# Patient Record
Sex: Female | Born: 1975
Health system: Southern US, Community
[De-identification: ages and names within clinical notes are randomized; demographics above are authoritative.]

## PROBLEM LIST (undated history)

## (undated) DIAGNOSIS — B181 Chronic viral hepatitis B without delta-agent: Secondary | ICD-10-CM

## (undated) DIAGNOSIS — D649 Anemia, unspecified: Secondary | ICD-10-CM

## (undated) DIAGNOSIS — Z9289 Personal history of other medical treatment: Secondary | ICD-10-CM

## (undated) DIAGNOSIS — B20 Human immunodeficiency virus [HIV] disease: Secondary | ICD-10-CM

## (undated) DIAGNOSIS — Z21 Asymptomatic human immunodeficiency virus [HIV] infection status: Secondary | ICD-10-CM

## (undated) DIAGNOSIS — D849 Immunodeficiency, unspecified: Secondary | ICD-10-CM

## (undated) HISTORY — PX: ENDOMETRIAL BIOPSY: SHX622

## (undated) NOTE — *Deleted (*Deleted)
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 female with a past medical history significant for hepatitis B, HIV, chronic anemia who presented to med [facility] with abdominal pain.  She had a 2-week history of early satiety, poor appetite and, 5 pound weight loss.  Abdominal pain is typically postprandial and mainly at night after dinner while laying in bed.  Has been associated with nausea improves after vomiting.  Labs on admission showed a hemoglobin of 9.3, MCV 67.4, platelet count 554,000.  Lipase was mildly elevated at 123.  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  Husband was at the bedside at the time of the visit.  The patient reports that she is having ongoing abdominal discomfort today.  Pain is primarily in her epigastric area and radiates to the left side of her abdomen.  States pain is worse when laying down after she eats.  She states that she has had this pain for at least 2 weeks.  Her abdominal pain is worse after eating.  She has had some nausea and intermittent vomiting.  Vomiting makes her pain better.  She was attributing her symptoms to some the medications that she was taking for fertility treatments.  She reports a poor appetite and about a 5 pound weight loss.  She is not having  any fevers or chills.  Denies night sweats.  She has not noticed any palpable lymphadenopathy.  She denies headaches, dizziness, chest pain, shortness of breath, diarrhea.  She reports intermittent constipation.  Denies bleeding or lower extremity edema.  The patient is married and has no children.  She currently works as an Charity fundraiser.  Denies alcohol tobacco use.  Family history significant for paternal grandmother with uterine cancer.  Medical oncology was asked see the patient to make recommendations regarding her abnormal CT scan findings.  INTERVAL HISTORY: Heather [name] is a 43 year old female who is here for evaluation and management of Large B-cell lymphoma. We are joined today by her husband.*** The patient's last visit with [place] was on ***. The pt reports that *** is doing well overall.  The pt reports ***  Of note since the patient's last visit, pt has had *** completed on *** with results revealing ***.  Lab results today (09/05/20) of CBC w/diff and CMP is as follows: all values are WNL except for ***.  On review of systems, pt reports *** and denies ***and any other symptoms.   A&P: -Discussed pt labwork today, 09/05/20; *** -***  MEDICAL HISTORY:  Past Medical History:  Diagnosis Date  . Anemia   . Diffuse large B-cell lymphoma of lymph nodes of multiple regions (HCC) 04/12/2020  . 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 History of blood transfusion    childhood  . HIV (human immunodeficiency virus infection) (HCC)   . Immune deficiency disorder [facility] [place])     SURGICAL HISTORY: Past Surgical  History:  Procedure Laterality Date  . CHROMOPERTUBATION Bilateral 11/29/2016   Procedure: CHROMOPERTUBATION;  Surgeon: Hoover Browns, MD;  Location: [facility];  Service: Gynecology;  Laterality: Bilateral;  fallopian tubes  . ENDOMETRIAL BIOPSY    . IR IMAGING GUIDED PORT INSERTION  04/15/2020  . IR THORACENTESIS ASP PLEURAL SPACE W/IMG GUIDE  04/15/2020  . MYOMECTOMY N/A 11/29/2016   Procedure:  MYOMECTOMY;  Surgeon: Hoover Browns, MD;  Location: [facility];  Service: Gynecology;  Laterality: N/A;    SOCIAL HISTORY: Social History   Socioeconomic History  . Marital status: Married    Spouse name: Not on file  . Number of children: Not on file  . Years of education: Not on file  . Highest education level: Not on file  Occupational History  . Not on file  Tobacco Use  . Smoking status: Never Smoker  . Smokeless tobacco: Never Used  Substance and Sexual Activity  . Alcohol use: Yes    Comment: occ  . Drug use: No  . Sexual activity: Yes  Other Topics Concern  . Not on file  Social History Narrative  . Not on file   Social Determinants of Health   Financial Resource Strain:   . Difficulty of Paying Living Expenses: Not on file  Food Insecurity:   . Worried About Programme researcher, broadcasting/film/video in the Last Year: Not on file  . Ran Out of Food in the Last Year: Not on file  Transportation Needs:   . Lack of Transportation (Medical): Not on file  . Lack of Transportation (Non-Medical): Not on file  Physical Activity:   . Days of Exercise per Week: Not on file  . Minutes of Exercise per Session: Not on file  Stress:   . Feeling of Stress : Not on file  Social Connections:   . Frequency of Communication with Friends and Family: Not on file  . Frequency of Social Gatherings with Friends and Family: Not on file  . Attends Religious Services: Not on file  . Active Member of Clubs or Organizations: Not on file  . Attends Banker Meetings: Not on file  . Marital Status: Not on file  Intimate Partner Violence:   . Fear of Current or Ex-Partner: Not on file  . Emotionally Abused: Not on file  . Physically Abused: Not on file  . Sexually Abused: Not on file    FAMILY HISTORY: Family History  Problem Relation Age of Onset  . Diabetes Mother   . Hypertension Mother     ALLERGIES:  has No Known Allergies.  MEDICATIONS:  Current Outpatient Medications  Medication Sig  Dispense Refill  . acetaminophen (TYLENOL) 500 MG tablet Take 500 mg by mouth every 6 (six) hours as needed for mild pain or headache.    Marland Kitchen acyclovir (ZOVIRAX) 400 MG tablet Take 1 tablet (400 mg total) by mouth 2 (two) times daily. 60 tablet 6  . B Complex Vitamins (B COMPLEX PO) Take 1 tablet by mouth daily.    . bictegravir-emtricitabine-tenofovir AF (BIKTARVY) 50-200-25 MG TABS tablet Take 1 tablet by mouth daily with breakfast.     . cholecalciferol (VITAMIN D3) 25 MCG (1000 UNIT) tablet Take 1 tablet (1,000 Units total) by mouth at bedtime.    . feeding supplement, ENSURE ENLIVE, (ENSURE ENLIVE) LIQD Take 237 mLs by mouth 2 (two) times daily between meals. 237 mL 12  . HYDROcodone-acetaminophen (NORCO) 5-325 MG tablet Take 1 tablet by mouth every 6 (six) hours as needed  for moderate pain. 30 tablet 0  . ibuprofen (ADVIL) 200 MG tablet Take 2 tablets (400 mg total) by mouth every 6 (six) hours as needed for mild pain. (Patient not taking: Reported on 06/20/2020) 30 tablet 0  . lidocaine-prilocaine (EMLA) cream Apply to affected area once (Patient taking differently: Apply 1 application topically as needed (port access). Apply to affected area once) 30 g 3  . LORazepam (ATIVAN) 0.5 MG tablet Take 1 tablet (0.5 mg total) by mouth every 6 (six) hours as needed (for chemo induced nausea or vomiting). 30 tablet 0  . Multiple Vitamin (MULTIVITAMIN WITH MINERALS) TABS tablet Take 1 tablet by mouth at bedtime.     . ondansetron (ZOFRAN) 8 MG tablet Take 1 tablet (8 mg total) by mouth every 8 (eight) hours as needed for nausea or vomiting. 30 tablet 1  . polyethylene glycol (MIRALAX / GLYCOLAX) 17 g packet Take 17 g by mouth daily as needed for mild constipation. 14 each 0  . prochlorperazine (COMPAZINE) 10 MG tablet Take 1 tablet (10 mg total) by mouth every 6 (six) hours as needed for nausea or vomiting. 30 tablet 1  . senna-docusate (SENOKOT-S) 8.6-50 MG tablet Take 2 tablets by mouth at bedtime as  needed for mild constipation.    . Sodium Chloride-Sodium Bicarb (SODIUM BICARBONATE/SODIUM CHLORIDE) SOLN 1 application by Mouth Rinse route 4 (four) times daily.     No current facility-administered medications for this visit.    REVIEW OF SYSTEMS:   A 10+ POINT REVIEW OF SYSTEMS WAS OBTAINED including neurology, dermatology, psychiatry, cardiac, respiratory, lymph, extremities, GI, GU, Musculoskeletal, constitutional, breasts, reproductive, HEENT.  All pertinent positives are noted in the HPI.  All others are negative.   PHYSICAL EXAMINATION: ECOG PERFORMANCE STATUS: 1 - Symptomatic but completely ambulatory  . There were no vitals filed for this visit. There were no vitals filed for this visit. .There is no height or weight on file to calculate BMI.   *** GENERAL:alert, in no acute distress and comfortable SKIN: no acute rashes, no significant lesions EYES: conjunctiva are pink and non-injected, sclera anicteric OROPHARYNX: MMM, no exudates, no oropharyngeal erythema or ulceration NECK: supple, no JVD LYMPH:  no palpable lymphadenopathy in the cervical, axillary or inguinal regions LUNGS: clear to auscultation b/l with normal respiratory effort HEART: regular rate & rhythm ABDOMEN:  normoactive bowel sounds , non tender, not distended. No palpable hepatosplenomegaly.  Extremity: no pedal edema PSYCH: alert & oriented x 3 with fluent speech NEURO: no focal motor/sensory deficits  LABORATORY DATA:  I have reviewed the data as listed  . CBC Latest Ref Rng & Units 09/02/2020 08/18/2020 08/05/2020  WBC 4.0 - 10.5 K/uL 3.3(L) 22.5(H) 3.6(L)  Hemoglobin 12.0 - 15.0 g/dL 11.0(L) 9.5(L) 9.8(L)  Hematocrit 36 - 46 % 35.1(L) 29.8(L) 31.0(L)  Platelets 150 - 400 K/uL 227 218 291    . CMP Latest Ref Rng & Units 09/02/2020 08/18/2020 08/05/2020  Glucose 70 - 99 mg/dL 97 161(W) 960(A)  BUN 6 - 20 mg/dL 13 9 15   Creatinine 0.44 - 1.00 mg/dL 5.40 9.81 1.91  Sodium 135 - 145 mmol/L 143  141 136  Potassium 3.5 - 5.1 mmol/L 4.5 3.8 3.5  Chloride 98 - 111 mmol/L 106 105 98  CO2 22 - 32 mmol/L 30 28 27   Calcium 8.9 - 10.3 mg/dL 47.8 9.9 9.3  Total Protein 6.5 - 8.1 g/dL 6.7 6.6 6.5  Total Bilirubin 0.3 - 1.2 mg/dL 0.4 0.3 0.7  Alkaline Phos 38 -  126 U/L 93 121 60  AST 15 - 41 U/L 20 20 17   ALT 0 - 44 U/L 18 17 20    04/08/2020 Pancreatic Mass Surgical Pathology Report [phone]):     RADIOGRAPHIC STUDIES: I have personally reviewed the radiological images as listed and agreed with the findings in the report. NM PET Image Restage (PS) Skull Base to Thigh  Result Date: 08/30/2020 CLINICAL DATA:  Subsequent treatment strategy for lymphoma. EXAM: NUCLEAR MEDICINE PET SKULL BASE TO THIGH TECHNIQUE: 7.1 mCi F-18 FDG was injected intravenously. Full-ring PET imaging was performed from the skull base to thigh after the radiotracer. CT data was obtained and used for attenuation correction and anatomic localization. Fasting blood glucose: 101 mg/dl COMPARISON:  29/56/2130. FINDINGS: Mediastinal blood pool activity: SUV max 2.45 Liver activity: SUV max 3.85 NECK: No FDG avid mass or adenopathy identified. Bilateral and relatively symmetric increased uptake within the soft tissues of the posterior neck localizes to areas of fat consistent with metabolically active brown fat. Incidental CT findings: none CHEST: No hypermetabolic mediastinal or hilar nodes. No suspicious pulmonary nodules on the CT scan. Incidental CT findings: none ABDOMEN/PELVIS: Further reduction in size of splenic lesions. Index lesion within the superior pole measures 3.2 x 2.7 cm with SUV max of 3.4 (Deauville criteria 3). Previously this was measured at 6 x 6.4 cm with SUV max of 4.5. Within the mid portion of the spleen there is a low-density lesion measuring 2.9 by 2.9 cm with SUV max of 3.98 (Deauville criteria 3). Previously 5.5 x 4.4 cm with SUV max of 4.53. No abnormal uptake within the liver, pancreas, or  adrenal glands. No FDG avid lymph nodes within the abdomen or pelvis. Incidental CT findings: Fibroid uterus. Nonobstructing left renal calculi. SKELETON: No focal hypermetabolic activity to suggest skeletal metastasis. Incidental CT findings: none IMPRESSION: 1. Continued reduction in size and metabolic activity of splenic lesions. Residual areas of low attenuation within the spleen exhibit mild FDG uptake compatible with Deauville criteria 3. 2. No new sites of disease. Electronically Signed   By: Signa Kell M.D.   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 Hemoglobin C], childhood malaria. Patient notes that she had a CT of the abdomen sometime in 2020 that showed no acute pathology.This was not [facility]. Not accessible to [place] at this point. She did have an MRI of the abdomen in July 2019 which showed indeterminate splenic lesions.No other acute abdominal pathology.No concern with hepatocellular carcinoma.  Patient is presenting now with  #1Pancreatic mass along with retroperitoneal lymphadenopathy and diffuse splenic lesions. No internal necrosis within the mass noted. Diffuse splenomegaly. Left periaortic lymph node causing mild displacement of the third portion of the duodenum.  Significantly elevated LDH levels. CA 19-9 and CEA levels unrevealing  # 2 Left sided large pleural effusion and lung atelectasis. S/p diagnostic and therapeutic thoracentesis -- lymphocytic predominance @ 80%  Overall picture concerning for possible  High grade B cell lymphoma vs primary pancreatic malignancy (though CA 19-9 levels indeterminate and not significantly elevated.  HIV could be risk factors for  high grade EBV driven lymphomas  Patient symptomatology has developed rather quickly over the last few weeks.  #3 microcytic anemia-chronic.Some element of iron deficiency versus hemoglobinopathy  versus anemia of chronic disease related to malignancy. Patient has received 1 dose of IV Feraheme Will rpt 2nd dose in 1 week.  #4 history of HIV/AIDS follows with Dr. Madlyn Frankel and at [facility]  # Leucocytosis - due to G-CSF PLAN: *** -No lab or clinical evidence of B-cell Lymphoma recurrence at this time.*** -Will give COVID19 booster and Flu vaccine after PET/CT.    FOLLOW UP: ***   The total time spent in the appt was *** minutes and more than 50% was on counseling and direct patient cares.  All of the patient's questions were answered with apparent satisfaction. The patient knows to call the clinic with any problems, questions or concerns.  Wyvonnia Lora MD MS AAHIVMS [facility] Hematology/Oncology Physician [facility]  (Office):       [phone] (Work cell):  [phone] (Fax):           [phone]  09/05/2020 4:07 PM  I, Carollee Herter, am acting as a scribe for Dr. Wyvonnia Lora.   {Add Production assistant, radio Statement}

---

## 2016-06-12 DIAGNOSIS — N920 Excessive and frequent menstruation with regular cycle: Secondary | ICD-10-CM | POA: Insufficient documentation

## 2016-07-06 ENCOUNTER — Other Ambulatory Visit: Payer: Self-pay | Admitting: Obstetrics & Gynecology

## 2016-07-06 DIAGNOSIS — D259 Leiomyoma of uterus, unspecified: Secondary | ICD-10-CM

## 2016-07-21 ENCOUNTER — Inpatient Hospital Stay: Admission: RE | Admit: 2016-07-21 | Payer: Self-pay | Source: Ambulatory Visit

## 2016-07-24 ENCOUNTER — Ambulatory Visit
Admission: RE | Admit: 2016-07-24 | Discharge: 2016-07-24 | Disposition: A | Discharge status: Home / Self Care | Payer: BLUE CROSS/BLUE SHIELD | Source: Ambulatory Visit | Attending: Obstetrics & Gynecology | Admitting: Obstetrics & Gynecology

## 2016-07-24 DIAGNOSIS — D259 Leiomyoma of uterus, unspecified: Secondary | ICD-10-CM

## 2016-07-24 MED ORDER — GADOBENATE DIMEGLUMINE 529 MG/ML IV SOLN
13.0000 mL | Freq: Once | INTRAVENOUS | Status: AC | PRN
  Administered 2016-07-24: 13 mL via INTRAVENOUS

## 2016-09-08 ENCOUNTER — Encounter (HOSPITAL_BASED_OUTPATIENT_CLINIC_OR_DEPARTMENT_OTHER): Payer: Self-pay | Admitting: Emergency Medicine

## 2016-09-08 ENCOUNTER — Emergency Department (HOSPITAL_BASED_OUTPATIENT_CLINIC_OR_DEPARTMENT_OTHER)
Admission: EM | Admit: 2016-09-08 | Discharge: 2016-09-08 | Disposition: A | Discharge status: Home / Self Care | Payer: BLUE CROSS/BLUE SHIELD | Attending: Emergency Medicine | Admitting: Emergency Medicine

## 2016-09-08 DIAGNOSIS — Y92410 Unspecified street and highway as the place of occurrence of the external cause: Secondary | ICD-10-CM | POA: Insufficient documentation

## 2016-09-08 DIAGNOSIS — Y999 Unspecified external cause status: Secondary | ICD-10-CM | POA: Insufficient documentation

## 2016-09-08 DIAGNOSIS — M542 Cervicalgia: Secondary | ICD-10-CM | POA: Insufficient documentation

## 2016-09-08 DIAGNOSIS — M545 Low back pain: Secondary | ICD-10-CM | POA: Insufficient documentation

## 2016-09-08 DIAGNOSIS — Y9389 Activity, other specified: Secondary | ICD-10-CM | POA: Insufficient documentation

## 2016-09-08 HISTORY — DX: Anemia, unspecified: D64.9

## 2016-09-08 MED ORDER — METHOCARBAMOL 500 MG PO TABS: 500.0000 mg | ORAL_TABLET | Freq: Two times a day (BID) | ORAL | 0 refills | Status: DC

## 2016-09-08 MED ORDER — IBUPROFEN 600 MG PO TABS: 600.0000 mg | ORAL_TABLET | Freq: Four times a day (QID) | ORAL | 0 refills | Status: DC | PRN
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
## 2016-09-08 NOTE — ED Notes (Signed)
Pt given d/c instructions as per chart. Verbalizes understanding. No questions. Rx x 2. 
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
## 2016-09-08 NOTE — Discharge Instructions (Signed)
Take your medications as prescribed as needed for pain relief. I also recommend applying ice to affected areas for 15 minutes 3-4 times daily for additional pain relief. Follow-up with your primary care provider within the next week if your symptoms have not improved. Please return to the Emergency Department if symptoms worsen or new onset of fever, numbness, tingling, groin anesthesia, loss of bowel or bladder, weakness.
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
## 2016-11-09 ENCOUNTER — Other Ambulatory Visit: Payer: Self-pay | Admitting: Obstetrics & Gynecology

## 2016-11-15 NOTE — Patient Instructions (Addendum)
Your procedure is scheduled on:  Thursday, Jan. 25, 2018  Enter through the [employer] of [facility] at:  7:30 AM  Pick up the phone at the desk and dial [phone].  Call this number if you have problems the morning of surgery: [phone].  Remember: Do NOT eat food or drink after:  Midnight Wednesday, Jan. 24, 2018  Take these medicines the morning of surgery with a SIP OF WATER:  None  Stop ALL herbal medications at this time  Do NOT smoke the day of surgery.  Do NOT wear jewelry (body piercing), metal hair clips/bobby pins, make-up, or nail polish. Do NOT wear lotions, powders, or perfumes.  You may wear deodorant. Do NOT shave for 48 hours prior to surgery. Do NOT bring valuables to the hospital. Contacts, dentures, or bridgework may not be worn into surgery.  Leave suitcase in car.  After surgery it may be brought to your room.  For patients admitted to the hospital, checkout time is 11:00 AM the day of discharge.  Bring a copy of your healthcare power of attorney and living will documents.   **Effective Friday, Jan. 12, 2018, [place] will implement no hospital visitations from children age 72 and younger due to a steady increase in flu activity in our community and hospitals. **

## 2016-11-16 ENCOUNTER — Encounter (HOSPITAL_COMMUNITY)
Admission: RE | Admit: 2016-11-16 | Discharge: 2016-11-16 | Disposition: A | Discharge status: Home / Self Care | Payer: Managed Care, Other (non HMO) | Source: Ambulatory Visit | Attending: Obstetrics & Gynecology | Admitting: Obstetrics & Gynecology

## 2016-11-16 ENCOUNTER — Encounter (HOSPITAL_COMMUNITY): Payer: Self-pay

## 2016-11-16 DIAGNOSIS — Z01812 Encounter for preprocedural laboratory examination: Secondary | ICD-10-CM | POA: Diagnosis not present

## 2016-11-16 HISTORY — DX: Personal history of other medical treatment: Z92.89

## 2016-11-16 LAB — BASIC METABOLIC PANEL
ANION GAP: 6 (ref 5–15)
BUN: 11 mg/dL (ref 6–20)
CHLORIDE: 103 mmol/L (ref 101–111)
CO2: 24 mmol/L (ref 22–32)
Calcium: 8.6 mg/dL — ABNORMAL LOW (ref 8.9–10.3)
Creatinine, Ser: 0.65 mg/dL (ref 0.44–1.00)
GFR calc Af Amer: >60 mL/min (ref >60)
GFR calc non Af Amer: >60 mL/min (ref >60)
GLUCOSE: 93 mg/dL (ref 65–99)
POTASSIUM: 4.3 mmol/L (ref 3.5–5.1)
Sodium: 133 mmol/L — ABNORMAL LOW (ref 135–145)

## 2016-11-16 LAB — CBC
HEMATOCRIT: 18.9 % — AB (ref 36.0–46.0)
Hemoglobin: 5.1 g/dL — CL (ref 12.0–15.0)
MCH: 15.8 pg — ABNORMAL LOW (ref 26.0–34.0)
MCHC: 27 g/dL — AB (ref 30.0–36.0)
MCV: 58.7 fL — AB (ref 78.0–100.0)
Platelets: 618 10*3/uL — ABNORMAL HIGH (ref 150–400)
RBC: 3.22 MIL/uL — ABNORMAL LOW (ref 3.87–5.11)
RDW: 23.6 % — AB (ref 11.5–15.5)
WBC: 4.6 10*3/uL (ref 4.0–10.5)

## 2016-11-16 LAB — ABO/RH: ABO/RH(D): O POS

## 2016-11-16 NOTE — Pre-Procedure Instructions (Signed)
Heather [name] notified that Heather [name] hgb was 5.1.  She will notify Heather [name].

## 2016-11-20 ENCOUNTER — Other Ambulatory Visit: Payer: Self-pay | Admitting: Obstetrics & Gynecology

## 2016-11-20 MED ORDER — DIPHENHYDRAMINE HCL 25 MG PO CAPS: 25.0000 mg | ORAL_CAPSULE | Freq: Once | ORAL | Status: DC

## 2016-11-20 MED ORDER — ACETAMINOPHEN 325 MG PO TABS: 650.0000 mg | ORAL_TABLET | Freq: Once | ORAL | Status: DC

## 2016-11-20 MED ORDER — FUROSEMIDE 10 MG/ML IJ SOLN: 20.0000 mg | Freq: Once | INTRAMUSCULAR | Status: DC

## 2016-11-20 MED ORDER — SODIUM CHLORIDE 0.9 % IV SOLN
Freq: Once | INTRAVENOUS | Status: AC
  Administered 2016-11-29 (×2): via INTRAVENOUS

## 2016-11-23 ENCOUNTER — Observation Stay (HOSPITAL_COMMUNITY)
Admission: AD | Admit: 2016-11-23 | Discharge: 2016-11-24 | Disposition: A | Discharge status: Home / Self Care | Payer: Managed Care, Other (non HMO) | Source: Ambulatory Visit | Attending: Obstetrics & Gynecology | Admitting: Obstetrics & Gynecology

## 2016-11-23 ENCOUNTER — Encounter (HOSPITAL_COMMUNITY): Payer: Self-pay | Admitting: *Deleted

## 2016-11-23 DIAGNOSIS — D649 Anemia, unspecified: Principal | ICD-10-CM | POA: Diagnosis present

## 2016-11-23 DIAGNOSIS — Z79899 Other long term (current) drug therapy: Secondary | ICD-10-CM | POA: Insufficient documentation

## 2016-11-23 DIAGNOSIS — D259 Leiomyoma of uterus, unspecified: Secondary | ICD-10-CM | POA: Diagnosis not present

## 2016-11-23 LAB — CBC
HEMATOCRIT: 20.3 % — AB (ref 36.0–46.0)
HEMOGLOBIN: 5.3 g/dL — AB (ref 12.0–15.0)
MCH: 15.5 pg — ABNORMAL LOW (ref 26.0–34.0)
MCHC: 26.1 g/dL — ABNORMAL LOW (ref 30.0–36.0)
MCV: 59.4 fL — AB (ref 78.0–100.0)
Platelets: 306 10*3/uL (ref 150–400)
RBC: 3.42 MIL/uL — ABNORMAL LOW (ref 3.87–5.11)
RDW: 23.2 % — ABNORMAL HIGH (ref 11.5–15.5)
WBC: 3.1 10*3/uL — AB (ref 4.0–10.5)

## 2016-11-23 LAB — PREPARE RBC (CROSSMATCH)

## 2016-11-23 MED ORDER — SODIUM CHLORIDE 0.9 % IV SOLN
Freq: Once | INTRAVENOUS | Status: AC
  Administered 2016-11-23: 13:00:00 via INTRAVENOUS

## 2016-11-23 MED ORDER — CEFAZOLIN SODIUM-DEXTROSE 2-4 GM/100ML-% IV SOLN
2.0000 g | INTRAVENOUS | Status: DC
  Filled 2016-11-23: qty 100

## 2016-11-23 MED ORDER — ACETAMINOPHEN 325 MG PO TABS
650.0000 mg | ORAL_TABLET | Freq: Once | ORAL | Status: AC
  Administered 2016-11-23: 650 mg via ORAL
  Filled 2016-11-23: qty 2

## 2016-11-23 MED ORDER — SODIUM CHLORIDE 0.9 % IV SOLN
Freq: Once | INTRAVENOUS | Status: AC
  Administered 2016-11-23: 16:00:00 via INTRAVENOUS

## 2016-11-23 MED ORDER — DIPHENHYDRAMINE HCL 25 MG PO CAPS
25.0000 mg | ORAL_CAPSULE | Freq: Once | ORAL | Status: AC
  Administered 2016-11-23: 25 mg via ORAL
  Filled 2016-11-23: qty 1

## 2016-11-23 MED ORDER — FUROSEMIDE 10 MG/ML IJ SOLN
20.0000 mg | Freq: Once | INTRAMUSCULAR | Status: AC
  Administered 2016-11-23: 20 mg via INTRAVENOUS
  Filled 2016-11-23: qty 2

## 2016-11-23 NOTE — H&P (Addendum)
Heather [name] is an 41 y.o. female who is was found to have a pre-op hemoglobin of 5.1 and is here for a blood transfusion before her upcoming abdominal myomectomy surgery on 11/30/15.    Pertinent Gynecological History: Menses: flow is heavy, but decreases with lysteda use.  Bleeding: menorrhaga Contraception: none DES exposure: unknown Blood transfusions: in childhood due to malaria Sexually transmitted diseases: no past history Previous GYN Procedures: DNC  Last mammogram: None (just turned 41 years) Last pap: normal Date: 07/05/16 OB History: G0   Menstrual History: Patient's last menstrual period was 11/15/2016 (exact date).    Past Medical History:  Diagnosis Date  . Anemia   . History of blood transfusion    childhood    Past Surgical History:  Procedure Laterality Date  . ENDOMETRIAL BIOPSY      Family History  Problem Relation Age of Onset  . Diabetes Mother   . Hypertension Mother     Social History:  reports that she has never smoked. She has never used smokeless tobacco. She reports that she drinks alcohol. She reports that she does not use drugs.  Allergies: No Known Allergies  Prescriptions Prior to Admission  Medication Sig Dispense Refill Last Dose  . ferrous sulfate 325 (65 FE) MG tablet Take 325 mg by mouth 3 (three) times daily with meals.   11/23/2016 at Unknown time  . ibuprofen (ADVIL,MOTRIN) 200 MG tablet Take 800 mg by mouth every 8 (eight) hours as needed (for menstrual cramping).   11/20/2016  . ibuprofen (ADVIL,MOTRIN) 600 MG tablet Take 1 tablet (600 mg total) by mouth every 6 (six) hours as needed. (Patient not taking: Reported on 11/12/2016) 30 tablet 0 Not Taking at Unknown time  . methocarbamol (ROBAXIN) 500 MG tablet Take 1 tablet (500 mg total) by mouth 2 (two) times daily. (Patient not taking: Reported on 11/12/2016) 20 tablet 0 Not Taking at Unknown time  . Multiple Vitamin (MULTIVITAMIN WITH MINERALS) TABS tablet Take 1 tablet by mouth  daily.   11/21/2016  . tranexamic acid (LYSTEDA) 650 MG TABS tablet Take 1,300 mg by mouth as directed. 1300 mg three times daily during menstration   11/21/2016    ROS Constitutional: Denies fevers/chills Cardiovascular: Denies chest pain or palpitations Pulmonary: Denies coughing or wheezing Gastrointestinal: Denies nausea, vomiting or diarrhea Genitourinary: Denies pelvic pain, unusual vaginal bleeding, unusual vaginal discharge, dysuria, urgency or frequency. With heavy periods.  Musculoskeletal: Denies muscle or joint aches and pain.  Neurology: Denies abnormal sensations such as tingling or numbness.   Physical Exam Constitutional: She is oriented to person, place, and time. She appears well-developed and well-nourished.  HENT:  Head: Normocephalic and atraumatic.  Neck: Normal range of motion.  Cardiovascular: Normal rate, regular rhythm and normal heart sounds.   Respiratory: Effort normal and breath sounds normal.  GI: Soft. Bowel sounds are normal. Uterus fundus level above umbilicus, palpable firm [name].   Neurological: She is alert and oriented to person, place, and time.  Skin: Skin is warm and dry.  Psychiatric: She has a normal mood and affect. Her behavior is normal.   LABS:   CBC    Component Value Date/Time   WBC 4.6 11/16/2016 1420   RBC 3.22 (L) 11/16/2016 1420   HGB 5.1 (LL) 11/16/2016 1420   HCT 18.9 (L) 11/16/2016 1420   PLT 618 (H) 11/16/2016 1420   MCV 58.7 (L) 11/16/2016 1420   MCH 15.8 (L) 11/16/2016 1420   MCHC 27.0 (L) 11/16/2016 1420  RDW 23.6 (H) 11/16/2016 1420   Blood type: O POS  Assessment/Plan: Heather [name] with history of [name], menorrhagia anemia found to have severe anemia pre-operatively now here for a blood transfusion before her abdominal myomectomy surgery on 11/30/2015   -Admit to Women's unit 3rd floor -CBC test before transfusion -Plan for PRBC transfusion of 4 units  -I discussed with patient risks, benefits and  alternatives of blood transfusion including risks of allergic reactions, transfusion reactions and infection.  We discussed the signs and symptoms that may present acutely or several days later after the transfusion.  All her questions were answered and she agreed to the blood transfusion.   Heather Dooms, MD.  11/23/2016, 10:43 AM

## 2016-11-23 NOTE — Progress Notes (Signed)
CRITICAL VALUE ALERT  Critical value received:  Hgb 5.3  Date of notification:  11/23/2016  Time of notification:  1101   Critical value read back: yes  Nurse who received alert:  Carollee Massed RN   MD notified (1st page):  Dr. Alesia Richards  Time of first page:  1110  MD notified (2nd page):  Time of second page:  Responding MD:  Alesia Richards  Time MD responded:  1110

## 2016-11-24 DIAGNOSIS — D649 Anemia, unspecified: Secondary | ICD-10-CM | POA: Diagnosis not present

## 2016-11-24 LAB — CBC
HEMATOCRIT: 34.9 % — AB (ref 36.0–46.0)
Hemoglobin: 10.8 g/dL — ABNORMAL LOW (ref 12.0–15.0)
MCH: 21 pg — AB (ref 26.0–34.0)
MCHC: 30.9 g/dL (ref 30.0–36.0)
MCV: 67.8 fL — AB (ref 78.0–100.0)
Platelets: 259 10*3/uL (ref 150–400)
RBC: 5.15 MIL/uL — ABNORMAL HIGH (ref 3.87–5.11)
RDW: 24.7 % — AB (ref 11.5–15.5)
WBC: 4.3 10*3/uL (ref 4.0–10.5)

## 2016-11-24 MED ORDER — SODIUM CHLORIDE 0.9% FLUSH
3.0000 mL | Freq: Two times a day (BID) | INTRAVENOUS | Status: DC
  Administered 2016-11-24: 3 mL via INTRAVENOUS

## 2016-11-24 NOTE — Discharge Summary (Signed)
Physician Discharge Summary  Patient ID: Heather [name] MRN: [id number] DOB/AGE: 1976/10/16 41 y.o.  Admit date: 11/23/2016 Discharge date: 11/24/2016  Admission Diagnoses: Severe Anemia, Fibroid Uterus  Discharge Diagnoses:  Active Problems:   Severe anemia   Fibroid uterus   Discharged Condition: good  Hospital Course: Blood Transfusion  Consults: None  Significant Diagnostic Studies: labs: CBC  Recent Results (from the past 2160 hour(s))  Basic metabolic panel     Status: Abnormal   Collection Time: 11/16/16  2:20 PM  Result Value Ref Range   Sodium 133 (L) 135 - 145 mmol/L   Potassium 4.3 3.5 - 5.1 mmol/L   Chloride 103 101 - 111 mmol/L   CO2 24 22 - 32 mmol/L   Glucose, Bld 93 65 - 99 mg/dL   BUN 11 6 - 20 mg/dL   Creatinine, Ser 0.65 0.44 - 1.00 mg/dL   Calcium 8.6 (L) 8.9 - 10.3 mg/dL   GFR calc non Af Amer >60 >60 mL/min   GFR calc Af Amer >60 >60 mL/min    Comment: (NOTE) The eGFR has been calculated using the CKD EPI equation. This calculation has not been validated in all clinical situations. eGFR's persistently <60 mL/min signify possible Chronic Kidney Disease.    Anion gap 6 5 - 15  CBC     Status: Abnormal   Collection Time: 11/16/16  2:20 PM  Result Value Ref Range   WBC 4.6 4.0 - 10.5 K/uL   RBC 3.22 (L) 3.87 - 5.11 MIL/uL   Hemoglobin 5.1 (LL) 12.0 - 15.0 g/dL    Comment: REPEATED TO VERIFY CRITICAL RESULT CALLED TO, READ BACK BY AND VERIFIED WITH: HESTER,T @1447  ON [id number] BY FLEMINGS    HCT 18.9 (L) 36.0 - 46.0 %   MCV 58.7 (L) 78.0 - 100.0 fL   MCH 15.8 (L) 26.0 - 34.0 pg   MCHC 27.0 (L) 30.0 - 36.0 g/dL   RDW 23.6 (H) 11.5 - 15.5 %   Platelets 618 (H) 150 - 400 K/uL  Type and screen [place]     Status: None (Preliminary result)   Collection Time: 11/16/16  2:20 PM  Result Value Ref Range   ISSUE DATE / TIME [id number]    Blood Product Unit Number [id number]    PRODUCT CODE [id number]    Unit Type and Rh  9500    Blood Product Expiration Date 354656812751    ISSUE DATE / TIME [id number]    Blood Product Unit Number [id number]    PRODUCT CODE [id number]    Unit Type and Rh 5100    Blood Product Expiration Date 903009233007    ISSUE DATE / TIME [id number]    Blood Product Unit Number [id number]    PRODUCT CODE [id number]    Unit Type and Rh 5100    Blood Product Expiration Date 035597416384    ISSUE DATE / TIME [id number]    Blood Product Unit Number [id number]    PRODUCT CODE B1694H03    Unit Type and Rh 5100    Blood Product Expiration Date 888280034917   ABO/Rh     Status: None   Collection Time: 11/16/16  2:20 PM  Result Value Ref Range   ABO/RH(D) O POS   Prepare RBC (crossmatch)     Status: None   Collection Time: 11/23/16 10:00 AM  Result Value Ref Range   Order Confirmation ORDER PROCESSED BY BLOOD BANK   CBC     Status: Abnormal   Collection Time:  11/23/16 10:15 AM  Result Value Ref Range   WBC 3.1 (L) 4.0 - 10.5 K/uL   RBC 3.42 (L) 3.87 - 5.11 MIL/uL   Hemoglobin 5.3 (LL) 12.0 - 15.0 g/dL    Comment: REPEATED TO VERIFY CRITICAL RESULT CALLED TO, READ BACK BY AND VERIFIED WITH: LAWSON,P.ON [id number] AT 1102 BY PATEL,S.    HCT 20.3 (L) 36.0 - 46.0 %   MCV 59.4 (L) 78.0 - 100.0 fL   MCH 15.5 (L) 26.0 - 34.0 pg   MCHC 26.1 (L) 30.0 - 36.0 g/dL   RDW 23.2 (H) 11.5 - 15.5 %   Platelets 306 150 - 400 K/uL  Prepare RBC     Status: None   Collection Time: 11/23/16 12:00 PM  Result Value Ref Range   Order Confirmation ORDER PROCESSED BY BLOOD BANK   CBC     Status: Abnormal (Preliminary result)   Collection Time: 11/24/16  5:14 AM  Result Value Ref Range   WBC PENDING 4.0 - 10.5 K/uL   RBC 5.15 (H) 3.87 - 5.11 MIL/uL   Hemoglobin 10.8 (L) 12.0 - 15.0 g/dL    Comment: DELTA CHECK NOTED REPEATED TO VERIFY POST TRANSFUSION SPECIMEN    HCT 34.9 (L) 36.0 - 46.0 %   MCV 67.8 (L) 78.0 - 100.0 fL    Comment: DELTA CHECK NOTED REPEATED TO VERIFY POST  TRANSFUSION SPECIMEN    MCH 21.0 (L) 26.0 - 34.0 pg   MCHC 30.9 30.0 - 36.0 g/dL   RDW 24.7 (H) 11.5 - 15.5 %   Platelets 259 150 - 400 K/uL    Treatments: None  Discharge Exam: Blood pressure 119/70, pulse 70, temperature 99 F (37.2 C), temperature source Oral, resp. rate 18, height 5' (1.524 m), weight 59 kg (130 lb), last menstrual period 11/15/2016, SpO2 100 %. General appearance: alert, cooperative and no distress Head: Normocephalic, without obvious abnormality, atraumatic Resp: clear to auscultation bilaterally Chest wall: no tenderness Cardio: regular rate and rhythm GI: soft, non-tender; bowel sounds normal; no masses,  no organomegaly Extremities: extremities normal, atraumatic, no cyanosis or edema Skin: Skin color, texture, turgor normal. No rashes or lesions  Disposition: 01-Home or Self Care  Discharge Instructions    Call MD for:  difficulty breathing, headache or visual disturbances    Complete by:  As directed    Call MD for:  extreme fatigue    Complete by:  As directed    Call MD for:  hives    Complete by:  As directed    Call MD for:  persistant dizziness or light-headedness    Complete by:  As directed    Call MD for:  temperature >100.4    Complete by:  As directed    Diet - low sodium heart healthy    Complete by:  As directed    Increase activity slowly    Complete by:  As directed      Allergies as of 11/24/2016   No Known Allergies     Medication List    TAKE these medications   ferrous sulfate 325 (65 FE) MG tablet Take 325 mg by mouth 3 (three) times daily with meals.   ibuprofen 200 MG tablet Commonly known as:  ADVIL,MOTRIN Take 800 mg by mouth every 8 (eight) hours as needed (for menstrual cramping).   ibuprofen 600 MG tablet Commonly known as:  ADVIL,MOTRIN Take 1 tablet (600 mg total) by mouth every 6 (six) hours as needed.   methocarbamol 500 MG  tablet Commonly known as:  ROBAXIN Take 1 tablet (500 mg total) by mouth 2  (two) times daily.   multivitamin with minerals Tabs tablet Take 1 tablet by mouth daily.   tranexamic acid 650 MG Tabs tablet Commonly known as:  LYSTEDA Take 1,300 mg by mouth as directed. 1300 mg three times daily during [facility] Follow up on 11/26/2016.   Specialty:  Obstetrics and Gynecology Contact information: [address] [place] [address] [phone]          Signed: Maryann Conners MSN, CNM 11/24/2016, 6:26 AM

## 2016-11-24 NOTE — Plan of Care (Signed)
Problem: Tissue Perfusion: Goal: Risk factors for ineffective tissue perfusion will decrease Outcome: Completed/Met Date Met: 11/24/16 SCDs on for (VTE) prophylaxis.

## 2016-11-24 NOTE — Plan of Care (Signed)
Problem: Fluid Volume: Goal: Ability to maintain a balanced intake and output will improve Outcome: Completed/Met Date Met: 11/24/16 Hgb 5.3 received 4U of PRBC, Lasix 49m. CBC in AM.

## 2016-11-24 NOTE — Progress Notes (Signed)

## 2016-11-26 LAB — TYPE AND SCREEN
BLOOD PRODUCT EXPIRATION DATE: 201802012359
Blood Product Expiration Date: 201801262359
Blood Product Expiration Date: 201802012359
Blood Product Expiration Date: 201802012359
ISSUE DATE / TIME: 201801191219
ISSUE DATE / TIME: 201801191552
ISSUE DATE / TIME: 201801191919
ISSUE DATE / TIME: 201801192306
UNIT TYPE AND RH: 5100
Unit Type and Rh: 5100
Unit Type and Rh: 5100
Unit Type and Rh: 9500

## 2016-11-26 NOTE — H&P (Signed)
Heather [name] is a 41 y.o.  female G:0  presents for an abdominal myomectomy because of large uterine fibroids,  menorrhagia and severe anemia.  The patient's menstrual period lasts for 7 days with pad change every 30 minutes accompanied by clots.  Fortunately she has had some relief from her bleeding with Lysteda but the pad change remains every hour.  Additionally,  she experiences cramping, on the first 2 days of her period,  that is rated 10/10 on a 10 point pain scale but is reduced to 3/10 with Ibuprofen 800 mg.  She admits to urinary frequency, nocturia x 5 and occasional constipation but denies dyspareunia, dysuria, or lower back pain.  An MRI of the pelvis in September 2017 to assess fibroids showed a uterus measuring: 22.4 x 10.8 x 17.3 cm with diffuse multiple fibroids ranging in size from 1 -12.7 cm.  The largest fibroid displaces the endometrium anteriorly and is partially sub-mucosal with some central features of degeneration; endometrium-5 mm, right and left  ovaries appeared normal.  The patient recently had a hemoglobin of 5.3 and was transfused 4 units of packed red blood cells yielding a post transfusion hemoglobin of 10.8  on 11/24/16.    A review of both medical and surgical management options were given to the patient for her symptoms and fibroids.  She has chosen to proceed with surgical management in the form of myomectomy.   Past Medical History  OB History: G: 0  GYN History: menarche: 41 YO    LMP: 11/15/2016    Contracepton no method  The patient denies history of sexually transmitted disease.  Denies history of abnormal PAP smear.   Last PAP smear: 2017 within normal limits  Medical History: Anemia  Surgical History:  NONE Denies problems with anesthesia.  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   ROS: Admits to nocturia x 5,  vomiting with pain, urinary frequency and occasional constipation,  but  denies  corrective lenses, headache, vision changes, nasal congestion, dysphagia, tinnitus, dizziness, hoarseness, cough,  chest pain, shortness of breath,   diarrhea,   urgency  dysuria, hematuria, vaginitis symptoms, pelvic pain, swelling of joints,easy bruising,  myalgias, arthralgias, skin rashes, unexplained weight loss and except as is mentioned in the history of present illness, patient's review of systems is otherwise negative.   Physical Exam  Bp: 116/60  P: 80 bpm.   R: 16   Temperature: 98.8 degrees F orally;  Height: 4\' 11"   Weight: 131 lbs.  BMI: 26.5  Neck: supple without masses or thyromegaly Lungs: clear to auscultation Heart: regular rate and rhythm Abdomen: firm mass from pelvis to level of xiphoid process;  non-tender and no organomegaly Pelvic:EGBUS- wnl; vagina-normal rugae; uterus-26 weeks size, extends to xiphoid process;   cervix: difficult to visualize do to displacement by uterus but  without visible lesions or motion tenderness; adnexae-no tenderness or separable masses Extremities:  no clubbing, cyanosis or edema   Assesment: Large Symptomatic Uterine Fibroids           Menorrhagia           Anemia   Disposition:  A  discussion was held with patient regarding the indication for her procedure(s) along with the risks, which include but are not limited to:  reaction to anesthesia, damage to adjacent organs, infection and excessive bleeding. The patient verbalized understanding of these risks and has consented to proceed with an Abdominal Myomectomy with Chromopertubation at [place] on November 29, 2016.  CSN# [id number]    Dyllin Gulley J. Florene Glen, PA-C  for Dr. Waymon Amato

## 2016-11-29 ENCOUNTER — Inpatient Hospital Stay (HOSPITAL_COMMUNITY)
Admission: RE | Admit: 2016-11-29 | Discharge: 2016-12-01 | DRG: 743 | Disposition: A | Discharge status: Home / Self Care | Payer: Managed Care, Other (non HMO) | Source: Ambulatory Visit | Attending: Obstetrics & Gynecology | Admitting: Obstetrics & Gynecology

## 2016-11-29 ENCOUNTER — Inpatient Hospital Stay (HOSPITAL_COMMUNITY): Payer: Managed Care, Other (non HMO) | Admitting: Anesthesiology

## 2016-11-29 ENCOUNTER — Encounter (HOSPITAL_COMMUNITY): Payer: Self-pay | Admitting: Emergency Medicine

## 2016-11-29 ENCOUNTER — Encounter (HOSPITAL_COMMUNITY)
Admission: RE | Disposition: A | Discharge status: Home / Self Care | Payer: Self-pay | Source: Ambulatory Visit | Attending: Obstetrics & Gynecology

## 2016-11-29 DIAGNOSIS — N979 Female infertility, unspecified: Secondary | ICD-10-CM | POA: Diagnosis present

## 2016-11-29 DIAGNOSIS — D252 Subserosal leiomyoma of uterus: Secondary | ICD-10-CM | POA: Diagnosis present

## 2016-11-29 DIAGNOSIS — D219 Benign neoplasm of connective and other soft tissue, unspecified: Secondary | ICD-10-CM | POA: Diagnosis present

## 2016-11-29 DIAGNOSIS — N920 Excessive and frequent menstruation with regular cycle: Secondary | ICD-10-CM | POA: Diagnosis present

## 2016-11-29 DIAGNOSIS — D6959 Other secondary thrombocytopenia: Secondary | ICD-10-CM | POA: Diagnosis not present

## 2016-11-29 DIAGNOSIS — D251 Intramural leiomyoma of uterus: Secondary | ICD-10-CM | POA: Diagnosis present

## 2016-11-29 DIAGNOSIS — D649 Anemia, unspecified: Secondary | ICD-10-CM

## 2016-11-29 DIAGNOSIS — D250 Submucous leiomyoma of uterus: Secondary | ICD-10-CM | POA: Diagnosis present

## 2016-11-29 DIAGNOSIS — D500 Iron deficiency anemia secondary to blood loss (chronic): Secondary | ICD-10-CM | POA: Diagnosis present

## 2016-11-29 DIAGNOSIS — D259 Leiomyoma of uterus, unspecified: Secondary | ICD-10-CM | POA: Diagnosis present

## 2016-11-29 HISTORY — PX: CHROMOPERTUBATION: SHX6288

## 2016-11-29 HISTORY — PX: MYOMECTOMY: SHX85

## 2016-11-29 LAB — POCT I-STAT EG7
ACID-BASE DEFICIT: 2 mmol/L (ref 0.0–2.0)
Acid-base deficit: 4 mmol/L — ABNORMAL HIGH (ref 0.0–2.0)
Bicarbonate: 24.4 mmol/L (ref 20.0–28.0)
Bicarbonate: 22.5 mmol/L (ref 20.0–28.0)
CALCIUM ION: 1.25 mmol/L (ref 1.15–1.40)
Calcium, Ion: 1.12 mmol/L — ABNORMAL LOW (ref 1.15–1.40)
HCT: 24 % — ABNORMAL LOW (ref 36.0–46.0)
HEMATOCRIT: 31 % — AB (ref 36.0–46.0)
HEMOGLOBIN: 8.2 g/dL — AB (ref 12.0–15.0)
HEMOGLOBIN: 10.5 g/dL — AB (ref 12.0–15.0)
O2 SAT: 46 %
O2 SAT: 27 %
PCO2 VEN: 50.1 mmHg (ref 44.0–60.0)
PH VEN: 7.322 (ref 7.250–7.430)
PO2 VEN: 28 mmHg — AB (ref 32.0–45.0)
PO2 VEN: 22 mmHg — AB (ref 32.0–45.0)
Potassium: 4.4 mmol/L (ref 3.5–5.1)
Potassium: 4.6 mmol/L (ref 3.5–5.1)
SODIUM: 139 mmol/L (ref 135–145)
Sodium: 139 mmol/L (ref 135–145)
TCO2: 26 mmol/L (ref 0–100)
TCO2: 24 mmol/L (ref 0–100)
pCO2, Ven: 47.3 mmHg (ref 44.0–60.0)
pH, Ven: 7.263 (ref 7.250–7.430)

## 2016-11-29 LAB — CBC
HCT: 25.1 % — ABNORMAL LOW (ref 36.0–46.0)
HEMOGLOBIN: 8.7 g/dL — AB (ref 12.0–15.0)
MCH: 27.4 pg (ref 26.0–34.0)
MCHC: 34.7 g/dL (ref 30.0–36.0)
MCV: 78.9 fL (ref 78.0–100.0)
PLATELETS: 39 10*3/uL — AB (ref 150–400)
RBC: 3.18 MIL/uL — AB (ref 3.87–5.11)
RDW: 18.6 % — ABNORMAL HIGH (ref 11.5–15.5)
WBC: 11.4 10*3/uL — AB (ref 4.0–10.5)

## 2016-11-29 LAB — COMPREHENSIVE METABOLIC PANEL
ALBUMIN: 2.7 g/dL — AB (ref 3.5–5.0)
ALK PHOS: 27 U/L — AB (ref 38–126)
ALT: 16 U/L (ref 14–54)
AST: 39 U/L (ref 15–41)
Anion gap: 6 (ref 5–15)
BUN: 10 mg/dL (ref 6–20)
CALCIUM: 7.2 mg/dL — AB (ref 8.9–10.3)
CHLORIDE: 107 mmol/L (ref 101–111)
CO2: 23 mmol/L (ref 22–32)
CREATININE: 0.71 mg/dL (ref 0.44–1.00)
GFR calc Af Amer: >60 mL/min (ref >60)
GFR calc non Af Amer: >60 mL/min (ref >60)
GLUCOSE: 144 mg/dL — AB (ref 65–99)
Potassium: 4.2 mmol/L (ref 3.5–5.1)
SODIUM: 136 mmol/L (ref 135–145)
Total Bilirubin: 1.3 mg/dL — ABNORMAL HIGH (ref 0.3–1.2)
Total Protein: 4.6 g/dL — ABNORMAL LOW (ref 6.5–8.1)

## 2016-11-29 LAB — PROTIME-INR
INR: 1.52
Prothrombin Time: 18.5 seconds — ABNORMAL HIGH (ref 11.4–15.2)

## 2016-11-29 LAB — PREGNANCY, URINE: PREG TEST UR: NEGATIVE

## 2016-11-29 LAB — PREPARE RBC (CROSSMATCH)

## 2016-11-29 LAB — APTT: aPTT: 34 seconds (ref 24–36)

## 2016-11-29 SURGERY — MYOMECTOMY, ABDOMINAL APPROACH
Anesthesia: General | Site: Uterus

## 2016-11-29 MED ORDER — LACTATED RINGERS IV SOLN
INTRAVENOUS | Status: DC
  Administered 2016-11-30: 01:00:00 via INTRAVENOUS

## 2016-11-29 MED ORDER — PHENYLEPHRINE 40 MCG/ML (10ML) SYRINGE FOR IV PUSH (FOR BLOOD PRESSURE SUPPORT)
PREFILLED_SYRINGE | INTRAVENOUS | Status: AC
  Filled 2016-11-29: qty 20

## 2016-11-29 MED ORDER — PHENYLEPHRINE HCL 10 MG/ML IJ SOLN
INTRAMUSCULAR | Status: AC
  Filled 2016-11-29: qty 1

## 2016-11-29 MED ORDER — ROCURONIUM BROMIDE 100 MG/10ML IV SOLN
INTRAVENOUS | Status: AC
  Filled 2016-11-29: qty 1

## 2016-11-29 MED ORDER — SODIUM CHLORIDE 0.9 % IV SOLN: 10.0000 mL/h | Freq: Once | INTRAVENOUS | Status: DC

## 2016-11-29 MED ORDER — VASOPRESSIN 20 UNIT/ML IV SOLN
INTRAVENOUS | Status: AC
  Filled 2016-11-29: qty 1

## 2016-11-29 MED ORDER — SUGAMMADEX SODIUM 200 MG/2ML IV SOLN
INTRAVENOUS | Status: AC
Start: 2016-11-29 — End: 2016-11-29
  Filled 2016-11-29: qty 2

## 2016-11-29 MED ORDER — LIDOCAINE HCL (CARDIAC) 20 MG/ML IV SOLN
INTRAVENOUS | Status: AC
Start: 2016-11-29 — End: 2016-11-29
  Filled 2016-11-29: qty 5

## 2016-11-29 MED ORDER — DEXTROSE 5 % IV SOLN
INTRAVENOUS | Status: DC | PRN
  Administered 2016-11-29: 25 ug/min via INTRAVENOUS

## 2016-11-29 MED ORDER — KETOROLAC TROMETHAMINE 30 MG/ML IJ SOLN
INTRAMUSCULAR | Status: AC
  Filled 2016-11-29: qty 1

## 2016-11-29 MED ORDER — DEXAMETHASONE SODIUM PHOSPHATE 4 MG/ML IJ SOLN
INTRAMUSCULAR | Status: AC
  Filled 2016-11-29: qty 1

## 2016-11-29 MED ORDER — MISOPROSTOL 200 MCG PO TABS
ORAL_TABLET | ORAL | Status: AC
Start: 2016-11-29 — End: 2016-11-29
  Administered 2016-11-29: 400 ug via RECTAL
  Filled 2016-11-29: qty 2

## 2016-11-29 MED ORDER — LIDOCAINE HCL (CARDIAC) 20 MG/ML IV SOLN
INTRAVENOUS | Status: DC | PRN
  Administered 2016-11-29: 60 mg via INTRAVENOUS

## 2016-11-29 MED ORDER — EPHEDRINE SULFATE 50 MG/ML IJ SOLN
INTRAMUSCULAR | Status: DC | PRN
Start: 2016-11-29 — End: 2016-11-29
  Administered 2016-11-29: 5 mg via INTRAVENOUS
  Administered 2016-11-29: 10 mg via INTRAVENOUS

## 2016-11-29 MED ORDER — ALBUMIN HUMAN 5 % IV SOLN
INTRAVENOUS | Status: AC
  Filled 2016-11-29: qty 250

## 2016-11-29 MED ORDER — FERROUS SULFATE 325 (65 FE) MG PO TABS
325.0000 mg | ORAL_TABLET | Freq: Three times a day (TID) | ORAL | Status: DC
  Administered 2016-11-30 – 2016-12-01 (×4): 325 mg via ORAL
  Filled 2016-11-29 (×4): qty 1

## 2016-11-29 MED ORDER — PHENYLEPHRINE 40 MCG/ML (10ML) SYRINGE FOR IV PUSH (FOR BLOOD PRESSURE SUPPORT)
PREFILLED_SYRINGE | INTRAVENOUS | Status: AC
  Filled 2016-11-29: qty 10

## 2016-11-29 MED ORDER — MIDAZOLAM HCL 2 MG/2ML IJ SOLN
INTRAMUSCULAR | Status: DC | PRN
  Administered 2016-11-29: 2 mg via INTRAVENOUS

## 2016-11-29 MED ORDER — DOCUSATE SODIUM 100 MG PO CAPS
100.0000 mg | ORAL_CAPSULE | Freq: Two times a day (BID) | ORAL | Status: DC
  Administered 2016-11-30 – 2016-12-01 (×3): 100 mg via ORAL
  Filled 2016-11-29 (×3): qty 1

## 2016-11-29 MED ORDER — SODIUM CHLORIDE 0.9 % IV SOLN: Freq: Once | INTRAVENOUS | Status: DC

## 2016-11-29 MED ORDER — SODIUM CHLORIDE 0.9% FLUSH
9.0000 mL | INTRAVENOUS | Status: DC | PRN
  Administered 2016-11-29: 9 mL via INTRAVENOUS
  Filled 2016-11-29: qty 9

## 2016-11-29 MED ORDER — SCOPOLAMINE 1 MG/3DAYS TD PT72
MEDICATED_PATCH | TRANSDERMAL | Status: AC
  Administered 2016-11-29: 1.5 mg via TRANSDERMAL
  Filled 2016-11-29: qty 1

## 2016-11-29 MED ORDER — ALBUMIN HUMAN 5 % IV SOLN
INTRAVENOUS | Status: DC | PRN
  Administered 2016-11-29 (×3): via INTRAVENOUS

## 2016-11-29 MED ORDER — CEFAZOLIN SODIUM-DEXTROSE 2-4 GM/100ML-% IV SOLN
2.0000 g | Freq: Once | INTRAVENOUS | Status: AC
  Administered 2016-11-29: 2 g via INTRAVENOUS

## 2016-11-29 MED ORDER — PHENYLEPHRINE 8 MG IN D5W 100 ML (0.08MG/ML) PREMIX OPTIME
INJECTION | INTRAVENOUS | Status: AC
  Filled 2016-11-29: qty 100

## 2016-11-29 MED ORDER — HYDROMORPHONE HCL 1 MG/ML IJ SOLN
INTRAMUSCULAR | Status: AC
  Administered 2016-11-29: 0.25 mg via INTRAVENOUS
  Filled 2016-11-29: qty 1

## 2016-11-29 MED ORDER — ONDANSETRON HCL 4 MG PO TABS
4.0000 mg | ORAL_TABLET | Freq: Three times a day (TID) | ORAL | Status: DC | PRN
Start: 2016-11-30 — End: 2016-12-01

## 2016-11-29 MED ORDER — LACTATED RINGERS IV SOLN
INTRAVENOUS | Status: DC
  Administered 2016-11-29 (×2): via INTRAVENOUS
  Administered 2016-11-29: 125 mL/h via INTRAVENOUS
  Administered 2016-11-29 (×2): via INTRAVENOUS

## 2016-11-29 MED ORDER — SCOPOLAMINE 1 MG/3DAYS TD PT72
1.0000 | MEDICATED_PATCH | Freq: Once | TRANSDERMAL | Status: DC
  Administered 2016-11-29: 1.5 mg via TRANSDERMAL

## 2016-11-29 MED ORDER — HYDROMORPHONE HCL 1 MG/ML IJ SOLN
0.2500 mg | INTRAMUSCULAR | Status: DC | PRN
  Administered 2016-11-29: 0.25 mg via INTRAVENOUS

## 2016-11-29 MED ORDER — ONDANSETRON HCL 4 MG/2ML IJ SOLN
INTRAMUSCULAR | Status: AC
  Filled 2016-11-29: qty 2

## 2016-11-29 MED ORDER — SUGAMMADEX SODIUM 200 MG/2ML IV SOLN
INTRAVENOUS | Status: AC
  Filled 2016-11-29: qty 2

## 2016-11-29 MED ORDER — METHYLENE BLUE 0.5 % INJ SOLN
INTRAVENOUS | Status: AC
  Filled 2016-11-29: qty 10

## 2016-11-29 MED ORDER — SCOPOLAMINE 1 MG/3DAYS TD PT72: 1.0000 | MEDICATED_PATCH | TRANSDERMAL | Status: DC

## 2016-11-29 MED ORDER — DEXAMETHASONE SODIUM PHOSPHATE 4 MG/ML IJ SOLN
INTRAMUSCULAR | Status: DC | PRN
  Administered 2016-11-29: 4 mg via INTRAVENOUS

## 2016-11-29 MED ORDER — ONDANSETRON HCL 4 MG/2ML IJ SOLN: 4.0000 mg | Freq: Four times a day (QID) | INTRAMUSCULAR | Status: DC | PRN

## 2016-11-29 MED ORDER — MISOPROSTOL 200 MCG PO TABS
400.0000 ug | ORAL_TABLET | Freq: Once | ORAL | Status: AC
  Administered 2016-11-29: 400 ug via RECTAL

## 2016-11-29 MED ORDER — KETOROLAC TROMETHAMINE 30 MG/ML IJ SOLN
30.0000 mg | Freq: Four times a day (QID) | INTRAMUSCULAR | Status: DC
  Administered 2016-11-30 (×2): 30 mg via INTRAVENOUS
  Filled 2016-11-29 (×2): qty 1

## 2016-11-29 MED ORDER — FENTANYL CITRATE (PF) 250 MCG/5ML IJ SOLN
INTRAMUSCULAR | Status: AC
  Filled 2016-11-29: qty 5

## 2016-11-29 MED ORDER — SODIUM CHLORIDE 0.9 % IR SOLN
Status: DC | PRN
  Administered 2016-11-29: 3000 mL

## 2016-11-29 MED ORDER — METHYLENE BLUE 0.5 % INJ SOLN
INTRAVENOUS | Status: DC | PRN
  Administered 2016-11-29: 20 mL via SUBMUCOSAL

## 2016-11-29 MED ORDER — TRANEXAMIC ACID 1000 MG/10ML IV SOLN
INTRAVENOUS | Status: DC
  Filled 2016-11-29: qty 100

## 2016-11-29 MED ORDER — TRANEXAMIC ACID 1000 MG/10ML IV SOLN
INTRAVENOUS | Status: DC | PRN
  Administered 2016-11-29: 1000 mg via INTRAVENOUS

## 2016-11-29 MED ORDER — PROMETHAZINE HCL 25 MG/ML IJ SOLN: 6.2500 mg | INTRAMUSCULAR | Status: DC | PRN

## 2016-11-29 MED ORDER — NALOXONE HCL 0.4 MG/ML IJ SOLN: 0.4000 mg | INTRAMUSCULAR | Status: DC | PRN

## 2016-11-29 MED ORDER — KETOROLAC TROMETHAMINE 30 MG/ML IJ SOLN
INTRAMUSCULAR | Status: DC | PRN
Start: 2016-11-29 — End: 2016-11-29
  Administered 2016-11-29: 30 mg via INTRAVENOUS

## 2016-11-29 MED ORDER — SUGAMMADEX SODIUM 200 MG/2ML IV SOLN
INTRAVENOUS | Status: DC | PRN
  Administered 2016-11-29: 240 mg via INTRAVENOUS

## 2016-11-29 MED ORDER — MIDAZOLAM HCL 2 MG/2ML IJ SOLN
INTRAMUSCULAR | Status: AC
  Filled 2016-11-29: qty 2

## 2016-11-29 MED ORDER — HYDROMORPHONE HCL 1 MG/ML IJ SOLN
INTRAMUSCULAR | Status: DC | PRN
  Administered 2016-11-29 (×2): 0.5 mg via INTRAVENOUS

## 2016-11-29 MED ORDER — IBUPROFEN 600 MG PO TABS
600.0000 mg | ORAL_TABLET | Freq: Four times a day (QID) | ORAL | Status: DC | PRN
Start: 2016-11-29 — End: 2016-11-30
  Administered 2016-11-30: 600 mg via ORAL
  Filled 2016-11-29: qty 1

## 2016-11-29 MED ORDER — TRANEXAMIC ACID 1000 MG/10ML IV SOLN
INTRAVENOUS | Status: AC
  Administered 2016-11-29: 100 mL via INTRAVENOUS
  Filled 2016-11-29: qty 100

## 2016-11-29 MED ORDER — PROPOFOL 10 MG/ML IV BOLUS
INTRAVENOUS | Status: DC | PRN
  Administered 2016-11-29: 150 mg via INTRAVENOUS

## 2016-11-29 MED ORDER — HYDROMORPHONE 1 MG/ML IV SOLN
INTRAVENOUS | Status: DC
  Administered 2016-11-29: 18:00:00 via INTRAVENOUS
  Administered 2016-11-29: 0.3 mg via INTRAVENOUS
  Administered 2016-11-29: 1.8 mg via INTRAVENOUS
  Administered 2016-11-30: 0.8 mg via INTRAVENOUS
  Administered 2016-11-30: 1.5 mg via INTRAVENOUS
  Filled 2016-11-29: qty 25

## 2016-11-29 MED ORDER — DIPHENHYDRAMINE HCL 50 MG/ML IJ SOLN: 12.5000 mg | Freq: Four times a day (QID) | INTRAMUSCULAR | Status: DC | PRN

## 2016-11-29 MED ORDER — PROPOFOL 10 MG/ML IV BOLUS
INTRAVENOUS | Status: AC
  Filled 2016-11-29: qty 20

## 2016-11-29 MED ORDER — ONDANSETRON HCL 4 MG/2ML IJ SOLN
INTRAMUSCULAR | Status: DC | PRN
  Administered 2016-11-29: 4 mg via INTRAVENOUS

## 2016-11-29 MED ORDER — MENTHOL 3 MG MT LOZG: 1.0000 | LOZENGE | OROMUCOSAL | Status: DC | PRN

## 2016-11-29 MED ORDER — PHENYLEPHRINE HCL 10 MG/ML IJ SOLN
INTRAMUSCULAR | Status: DC | PRN
  Administered 2016-11-29 (×2): 80 ug via INTRAVENOUS
  Administered 2016-11-29: 100 ug via INTRAVENOUS
  Administered 2016-11-29: 40 ug via INTRAVENOUS
  Administered 2016-11-29: 100 ug via INTRAVENOUS
  Administered 2016-11-29 (×2): 80 ug via INTRAVENOUS
  Administered 2016-11-29: 120 ug via INTRAVENOUS
  Administered 2016-11-29: 80 ug via INTRAVENOUS
  Administered 2016-11-29 (×2): 40 ug via INTRAVENOUS
  Administered 2016-11-29 (×3): 80 ug via INTRAVENOUS
  Administered 2016-11-29: 120 ug via INTRAVENOUS
  Administered 2016-11-29 (×4): 80 ug via INTRAVENOUS
  Administered 2016-11-29: 40 ug via INTRAVENOUS
  Administered 2016-11-29: 200 ug via INTRAVENOUS
  Administered 2016-11-29: 60 ug via INTRAVENOUS

## 2016-11-29 MED ORDER — FENTANYL CITRATE (PF) 100 MCG/2ML IJ SOLN
INTRAMUSCULAR | Status: DC | PRN
  Administered 2016-11-29: 50 ug via INTRAVENOUS
  Administered 2016-11-29: 100 ug via INTRAVENOUS
  Administered 2016-11-29: 50 ug via INTRAVENOUS
  Administered 2016-11-29: 100 ug via INTRAVENOUS
  Administered 2016-11-29 (×4): 50 ug via INTRAVENOUS

## 2016-11-29 MED ORDER — VASOPRESSIN 20 UNIT/ML IV SOLN
INTRAVENOUS | Status: DC | PRN
  Administered 2016-11-29: 61 mL via INTRAMUSCULAR

## 2016-11-29 MED ORDER — DIPHENHYDRAMINE HCL 12.5 MG/5ML PO ELIX: 12.5000 mg | ORAL_SOLUTION | Freq: Four times a day (QID) | ORAL | Status: DC | PRN

## 2016-11-29 MED ORDER — HYDROMORPHONE HCL 1 MG/ML IJ SOLN
INTRAMUSCULAR | Status: AC
  Filled 2016-11-29: qty 1

## 2016-11-29 MED ORDER — EPHEDRINE 5 MG/ML INJ
INTRAVENOUS | Status: AC
  Filled 2016-11-29: qty 10

## 2016-11-29 MED ORDER — OXYCODONE-ACETAMINOPHEN 5-325 MG PO TABS
1.0000 | ORAL_TABLET | ORAL | Status: DC | PRN
  Administered 2016-11-30: 1 via ORAL
  Administered 2016-11-30: 2 via ORAL
  Administered 2016-12-01 (×2): 1 via ORAL
  Filled 2016-11-29 (×2): qty 2
  Filled 2016-11-29 (×2): qty 1

## 2016-11-29 MED ORDER — SODIUM CHLORIDE 0.9 % IJ SOLN
INTRAMUSCULAR | Status: AC
  Filled 2016-11-29: qty 40

## 2016-11-29 MED ORDER — ROCURONIUM BROMIDE 100 MG/10ML IV SOLN
INTRAVENOUS | Status: DC | PRN
  Administered 2016-11-29: 20 mg via INTRAVENOUS
  Administered 2016-11-29: 50 mg via INTRAVENOUS
  Administered 2016-11-29: 20 mg via INTRAVENOUS
  Administered 2016-11-29: 10 mg via INTRAVENOUS

## 2016-11-29 MED ORDER — TRIAMCINOLONE ACETONIDE 40 MG/ML IJ SUSP
120.0000 mg | Freq: Once | INTRAMUSCULAR | Status: AC
  Administered 2016-11-29: 120 mg
  Filled 2016-11-29: qty 3

## 2016-11-29 MED ORDER — THROMBIN 5000 UNITS EX SOLR
CUTANEOUS | Status: DC | PRN
  Administered 2016-11-29: 4000 [IU] via TOPICAL

## 2016-11-29 SURGICAL SUPPLY — 50 items
BARRIER ADHS 3X4 INTERCEED (GAUZE/BANDAGES/DRESSINGS) ×8 IMPLANT
BLADE SURG 10 STRL SS (BLADE) ×8 IMPLANT
CANISTER SUCT 3000ML (MISCELLANEOUS) ×4 IMPLANT
CLOTH BEACON ORANGE TIMEOUT ST (SAFETY) ×4 IMPLANT
DILATOR CANAL MILEX (MISCELLANEOUS) ×4 IMPLANT
DRAIN PENROSE 1/2X12 (DRAIN) ×4 IMPLANT
DRAPE UNDERBUTTOCKS STRL (DRAPE) ×4 IMPLANT
DRAPE WARM FLUID 44X44 (DRAPE) IMPLANT
DRSG OPSITE POSTOP 4X10 (GAUZE/BANDAGES/DRESSINGS) ×4 IMPLANT
DURAPREP 26ML APPLICATOR (WOUND CARE) ×4 IMPLANT
ELECT CAUTERY BLADE 6.4 (BLADE) ×4 IMPLANT
ELECT NEEDLE TIP 2.8 STRL (NEEDLE) ×4 IMPLANT
FILTER STRAW FLUID ASPIR (MISCELLANEOUS) ×4 IMPLANT
GAUZE SPONGE 4X4 16PLY XRAY LF (GAUZE/BANDAGES/DRESSINGS) IMPLANT
GLOVE BIOGEL PI IND STRL 7.0 (GLOVE) ×8 IMPLANT
GLOVE BIOGEL PI INDICATOR 7.0 (GLOVE) ×8
GLOVE SURG SS PI 6.5 STRL IVOR (GLOVE) ×4 IMPLANT
GLOVE SURG SS PI 7.0 STRL IVOR (GLOVE) ×8 IMPLANT
GLOVE SURG SS PI 7.5 STRL IVOR (GLOVE) ×24 IMPLANT
GOWN STRL REUS W/TWL LRG LVL3 (GOWN DISPOSABLE) ×12 IMPLANT
GOWN SURG XXL (GOWNS) ×4 IMPLANT
GOWN SURGICAL LARGE (GOWNS) ×4 IMPLANT
MANIPULATOR UTERINE 4.5 ZUMI (MISCELLANEOUS) ×4 IMPLANT
NEEDLE HYPO 22GX1.5 SAFETY (NEEDLE) ×8 IMPLANT
NS IRRIG 1000ML POUR BTL (IV SOLUTION) ×16 IMPLANT
PACK ABDOMINAL GYN (CUSTOM PROCEDURE TRAY) ×4 IMPLANT
PAD OB MATERNITY 4.3X12.25 (PERSONAL CARE ITEMS) ×4 IMPLANT
PENCIL SMOKE EVAC W/HOLSTER (ELECTROSURGICAL) ×4 IMPLANT
PROTECTOR NERVE ULNAR (MISCELLANEOUS) ×4 IMPLANT
SHEET LAVH (DRAPES) ×4 IMPLANT
SPONGE LAP 18X18 X RAY DECT (DISPOSABLE) ×20 IMPLANT
STAPLER VISISTAT 35W (STAPLE) ×4 IMPLANT
SURGIFLO W/THROMBIN 8M KIT (HEMOSTASIS) ×8 IMPLANT
SUT MON AB 4-0 PS1 27 (SUTURE) ×4 IMPLANT
SUT PDS AB 0 CTX 60 (SUTURE) ×8 IMPLANT
SUT PLAIN 2 0 XLH (SUTURE) ×4 IMPLANT
SUT VIC AB 0 CT1 27 (SUTURE) ×36
SUT VIC AB 0 CT1 27XBRD ANBCTR (SUTURE) ×36 IMPLANT
SUT VIC AB 3-0 CT1 27 (SUTURE) ×16
SUT VIC AB 3-0 CT1 TAPERPNT 27 (SUTURE) ×16 IMPLANT
SUT VIC AB 4-0 SH 27 (SUTURE) ×8
SUT VIC AB 4-0 SH 27XANBCTRL (SUTURE) ×8 IMPLANT
SYR 10ML LL (SYRINGE) ×16 IMPLANT
SYR CONTROL 10ML LL (SYRINGE) ×4 IMPLANT
SYRINGE 60CC LL (MISCELLANEOUS) ×4 IMPLANT
TOWEL OR 17X24 6PK STRL BLUE (TOWEL DISPOSABLE) ×8 IMPLANT
TRAY FOLEY CATH SILVER 14FR (SET/KITS/TRAYS/PACK) ×4 IMPLANT
TUBING CONNECTING 10 (TUBING) ×3 IMPLANT
TUBING CONNECTING 10' (TUBING) ×1
YANKAUER SUCT BULB TIP NO VENT (SUCTIONS) ×4 IMPLANT

## 2016-11-29 NOTE — Progress Notes (Addendum)
Day of Surgery Procedure(s) : 11/29/2016 MYOMECTOMY (N/A) CHROMOPERTUBATION (Bilateral)  Heather [name], Heather [name] Female, 41 y.o., 1976/04/19  Recap of events in the PACU.  I walked in to check on patient and found her to have low BPs in 80/50s, pulse in 110s and patient appearing somnolent.  A quick exam revealed no vaginal bleeding, soft abdomen with mild distension.  Anesthesia was already present and had ordered stat 2 units PRBC transfusion and FFP which were administered alongside phenylephrine.  Patient's vitals improved and she became awake and conversant within a few minutes.  An attempt was made to draw labs at that time but it was impossible.  However blood was able to be drawn after transfusion of the PRBC and FFP had been accomplished.  Patient was then deemed stable for transfer to Surgery floor.     Assessment of patient now on the Surgery floor:   Subjective: Patient reports feeling okay.  Tolerating ice chips without nausea or vomiting.  Her pain is well controlled with dilaudid PCA.  She denies chest pain or shortness of breath.   Objective: I have reviewed patient's vital signs, intake and output, medications and labs. Blood pressure 95/78, pulse 74, temperature 98.4 F (36.9 C), temperature source Oral, resp. rate 14, height 5' (1.524 m), weight 59 kg (130 lb), last menstrual period 11/15/2016, SpO2 100 %. Urine output 300 cc/2 hrs (clear).  General: alert and cooperative Resp: clear to auscultation bilaterally Cardio: regular rate and rhythm, S1, S2 normal, no murmur, click, rub or gallop GI: soft, non-tender; bowel sounds normal; no masses,  no organomegaly and soft, appropriately tender to palpation, no rebound, no guarding.  positive bowel sounds.  Extremities: extremities normal, atraumatic, no cyanosis or edema Vaginal Bleeding: none  Abdomen: mild distension.   CBC    Component Value Date/Time   WBC 11.4 (H) 11/29/2016 1510   RBC 3.18 (L) 11/29/2016 1510   HGB 8.7  (L) 11/29/2016 1510   HCT 25.1 (L) 11/29/2016 1510   PLT 39 (L) 11/29/2016 1510   MCV 78.9 11/29/2016 1510   MCH 27.4 11/29/2016 1510   MCHC 34.7 11/29/2016 1510   RDW 18.6 (H) 11/29/2016 1510   CMP     Component Value Date/Time   NA 136 11/29/2016 1510   K 4.2 11/29/2016 1510   CL 107 11/29/2016 1510   CO2 23 11/29/2016 1510   GLUCOSE 144 (H) 11/29/2016 1510   BUN 10 11/29/2016 1510   CREATININE 0.71 11/29/2016 1510   CALCIUM 7.2 (L) 11/29/2016 1510   PROT 4.6 (L) 11/29/2016 1510   ALBUMIN 2.7 (L) 11/29/2016 1510   AST 39 11/29/2016 1510   ALT 16 11/29/2016 1510   ALKPHOS 27 (L) 11/29/2016 1510   BILITOT 1.3 (H) 11/29/2016 1510   GFRNONAA >60 11/29/2016 1510   GFRAA >60 11/29/2016 1510    Protime-INR  Order: [id number]  Status:  Final result Visible to patient:  No (Not Released) Next appt:  None   Ref Range & Units 1d ago  Prothrombin Time 11.4 - 15.2 seconds 18.5    INR  1.52   Resulting Agency  [facility]    Specimen Collected: 11/29/16 15:10 Last Resulted: 11/29/16 16:20                      Encounter   View Encounter     Result Information   Flag: Abnormal Status: Final result (Collected: 11/29/2016 15:10) Provider Status: Ordered     11/30/15: APTT: 34  Assessment: s/p Procedure(s) with comments: MYOMECTOMY (N/A) CHROMOPERTUBATION (Bilateral) - fallopian tubes: stable, s/p total transfusion of 4 units PRBC, 1 unit FFP, now with thrombocytopenia, likely due to recent acute intra-op blood loss,   Plan: I discussed with patient and her family at bedside intra-op findings and PACU course as well as post op plans.  All her questions were answered and se expressed understanding of the material.  -Maintain NPO except for ice chips over night -PCA pump and toradol for pain control -Transfuse 2 units of platelets  -Close monitoring of urine output -Plan to d/c PCA and foley as well as advance diet in the AM if stable  LOS: 0 days    [facility]  [facility] 11/29/2016, 9:05 PM

## 2016-11-29 NOTE — Progress Notes (Signed)
Contact with Dr. Mancel Bale. Informed Dr. Mancel Bale that we have been unable to reach Dr. Alesia Richards for critical value. Dr. Quintella Baton that she will contact Dr. Alesia Richards and tell her to contact the floor. Toya Smothers, RN

## 2016-11-29 NOTE — Progress Notes (Addendum)
Assumed care of pt after receiving report from [employer].   2021: to blood bank to pickup platelet ordered from previous shift  2037: Pre VSS. see flow sheet for details.  2041: Platelet up with 2-RN verification.   2050: Pt resting quietly with eyes closed. Pt using PCA to give self extra dose of dilaudid. States pain is tolerable 2/10.   2056: see flow sheet for 15 min VS during platelet administration.   2058: platelets increased to 374ml/hr. Pt tolerating it well.   2155: 1st bag platelet completed. See flow sheet for Vs.   2215: MD called to unit for update on pt. CBC to be redrawn 0500 and pt may sleep w/o ambulating til next day.   2227: 2nd bag of platelets up per MD orders.   2347: 2nd bag Platelet completed. VSS. See flow sheet for details.   0145: belly soft. Old drainage marked noted on honeycomb dsing.   0530: belly soft. dsing unchanged. VS taken. See flow sheet for details.

## 2016-11-29 NOTE — Op Note (Addendum)
Heather [name], Heather [name] Female, 41 y.o., 06/27/1976 MRN:  [id number]  Pre-op diagnosis:   1. Fibroid uterus.  2. Menorrhagia.  3.  Anemia 3. Unsuccesfull medical management of fibroid symptoms, now desiring surgical management with conservation of her uterus.  4.  Infertility. 5.  History of keloids.   Post-op diagnosis: Same as above.   Procedure: Abdominal myomectomy and chromopertubation  Anesthesia:  General   Complications: Significant blood loss requiring intra-op blood transfusion.    Findings: Enlarged 28 week sized uterus with multiple uterine fibroids, about 17 total.  Normal appearing left and right ovaries and fallopian tubes.  Lack of methylene blue spillage in bilateral tubes.     EBL: 2500 cc  IVF: 3L LR  Urine output: 1100 cc  Indications:   41 y/o P0 who desired abdominal myomectomy and chromopertubation for management of her fibroid symptoms as well as check fallopian tube patency respectively.       PROCEDURE:  Informed consent was obtained from the patient to undergo the procedure after explaining the risks, benefits and alternatives of the procedures.  She also consented to a blood transfusion if needed at any time.     Anesthesia was administered without difficulty and an exam perfomed under anesthesia revealing the above.  The patient was prepped vaginally and abdominally and draped in the usual sterile fashion.  A foley catheter was placed in and ZUMI uterine manipulator were placed.        Ancef was given pre-operatively.  IV tranexamic acid was also given about 15 minutes before start of the procedure and until the case was concluded.  The patient had also received rectal cytotec about 1 hour before procedure start.  Diluted triamcinolone was injected bilaterally over the midline vertical incision site.  A midline vertical incision was made with the scalpel and carried through the underlying subcutaneous layer and fascia with the bovie.  Small perforators were  contained with the bovie.  The fascia was nicked in the midline and the fascia separated from the rectus muscles superiorly and inferiorly.    The rectus muscle was separated in the midline and the uterus was inspected and the fibroids noted.    The uterus exteriorized and was noted to have several subserosal and intramural fibroids.  Methylene blue was injected into the uterus via the Omer manipulator, with no spillage of dye through the tubes.  Vasopressin was injected over the fundal located fibroids, which were then incised with scalpel and the fibroids were shelled from the sorrounding myometrium.   The fibroid feeding vessels when identifiable were clamped with hemostat, bovied and suture tied with 3-0 vicryl.  The uterine defects were closed with 0-vicryl in a running stitch in 3 layers after application of floseal in the myometrium to enhance hemostasis.  After removal of one of the fundal fibroids (about 5 cm large) methylene blue dye was noted to spill through the incision thus confirming that the endometrium had been entered.  For this reason a cesarean delivery is recommended in the future in case of pregnancy.   The subserosal layer was closed in a baseball stitch using 3-0 vicryl.  A similar procedure was perfomed to remove the large posterior fibroid which had subserosal, intramural and submucosal component.  When the posterior fibroid was removed there was no spillage of dye through the tissue however the intact endometrial sac with the dye and ZUMI bulb were visible and palpable respectively.  When closing this fibroid defect care was taken no to puncture the  ZUMI bulb and to maintain the endometrium sac as it was, closing the myometrial layers around it.  When possible I removed as many fibroids as possible through the same incision.  Four  main incisions were made through the uterus, three of them in fundal area and one in fundal-posterior region.  Intra op EBL was noted to be about 2500 cc with  patient noted to have low BP and tachycardia, therefore 2 units of blood and albumin were transfused intraoperatively.  Of note patient is almost 15ft tall and weigh about 130 lbs.  At the end of the procedure good  hemostasis was noted.  The pelvis was irrigated and suctioned out.  Interceed was placed over the uterus and the uterus returned into the cavity.      Mass closure using two sutures of 0-PDS was performed for each half of the incision.  The subcutaneous layer was closed off with plain suture.  The skin was closed off with staples.  The remaining triamcinolone was injected over the incision.   Patient was cleaned and dried, honey comb dressing was placed over the incision.    The patient was awoken from anesthesia and taken to the recovery room in stable condition.    Dr. Alesia Richards.

## 2016-11-29 NOTE — Anesthesia Preprocedure Evaluation (Addendum)
Anesthesia Evaluation  Patient identified by MRN, date of birth, ID band Patient awake    Reviewed: Allergy & Precautions, NPO status , Patient's Chart, lab work & pertinent test results  History of Anesthesia Complications Negative for: history of anesthetic complications  Airway Mallampati: II  TM Distance: >3 FB Neck ROM: Full    Dental no notable dental hx. (+) Poor Dentition, Dental Advisory Given   Pulmonary neg pulmonary ROS,    Pulmonary exam normal        Cardiovascular negative cardio ROS Normal cardiovascular exam     Neuro/Psych negative neurological ROS     GI/Hepatic negative GI ROS, Neg liver ROS,   Endo/Other  negative endocrine ROS  Renal/GU negative Renal ROS     Musculoskeletal negative musculoskeletal ROS (+)   Abdominal   Peds  Hematology negative hematology ROS (+)   Anesthesia Other Findings Day of surgery medications reviewed with the patient.  Reproductive/Obstetrics                            Anesthesia Physical Anesthesia Plan  ASA: II  Anesthesia Plan: General   Post-op Pain Management:    Induction: Intravenous  Airway Management Planned: Oral ETT  Additional Equipment:   Intra-op Plan:   Post-operative Plan: Extubation in OR  Informed Consent: I have reviewed the patients History and Physical, chart, labs and discussed the procedure including the risks, benefits and alternatives for the proposed anesthesia with the patient or authorized representative who has indicated his/her understanding and acceptance.   Dental advisory given  Plan Discussed with: Anesthesiologist  Anesthesia Plan Comments:        Anesthesia Quick Evaluation

## 2016-11-29 NOTE — OR Nursing (Signed)
Interceed placed in pelvic cavity by Dr. Alesia Richards.

## 2016-11-29 NOTE — Interval H&P Note (Signed)
History and Physical Interval Note:  11/29/2016 8:30 AM  Heather [name]  has presented today for surgery, with the diagnosis of Uterine Fibroids, MENORRHAGIA, ANEMIA AND INFERTILITY.  The various methods of treatment have been discussed with the patient and family. After consideration of risks, benefits and other options for treatment, the patient has consented to  Procedure(s): MYOMECTOMY (N/A) (ABDOMINAL) AND CHROMOPERTUBATION as a surgical intervention .  We discussed possible need of blood transfusion and patient is agreeable to it.  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  The patient also agreed to this.  The patient's history has been reviewed, patient examined, no change in status, stable for surgery.  I have reviewed the patient's chart and labs.  Questions were answered to the patient's satisfaction.     [name], MD.

## 2016-11-29 NOTE — Transfer of Care (Signed)
Immediate Anesthesia Transfer of Care Note  Patient: Kamiah Kimberling  Procedure(s) Performed: Procedure(s) with comments: MYOMECTOMY (N/A) CHROMOPERTUBATION (Bilateral) - fallopian tubes  Patient Location: PACU  Anesthesia Type:General  Level of Consciousness: awake, alert , oriented and patient cooperative  Airway & Oxygen Therapy: Patient Spontanous Breathing and Patient connected to nasal cannula oxygen  Post-op Assessment: Report given to RN and Post -op Vital signs reviewed and stable  Post vital signs: Reviewed and stable 103/64, HR 83, 100% Spo2  Last Vitals:  Vitals:   11/29/16 1436 11/29/16 1445  BP: 104/71 (!) 110/97  Pulse: 69 77  Resp: 16 13  Temp:  36.9 C    Last Pain:  Vitals:   11/29/16 1415  TempSrc:   PainSc: 3       Patients Stated Pain Goal: 3 (0000000 [id number])  Complications: No apparent anesthesia complications

## 2016-11-29 NOTE — Anesthesia Procedure Notes (Signed)
Procedure Name: Intubation Date/Time: 11/29/2016 9:00 AM Performed by: Raenette Rover Pre-anesthesia Checklist: Patient identified, Emergency Drugs available, Suction available and Patient being monitored Patient Re-evaluated:Patient Re-evaluated prior to inductionOxygen Delivery Method: Circle system utilized Preoxygenation: Pre-oxygenation with 100% oxygen Intubation Type: IV induction Ventilation: Mask ventilation without difficulty Laryngoscope Size: Mac and 3 Grade View: Grade I Tube type: Oral Tube size: 7.0 mm Number of attempts: 1 Airway Equipment and Method: Stylet Placement Confirmation: ETT inserted through vocal cords under direct vision,  positive ETCO2,  CO2 detector and breath sounds checked- equal and bilateral Secured at: 22 cm Tube secured with: Tape Dental Injury: Teeth and Oropharynx as per pre-operative assessment

## 2016-11-29 NOTE — Progress Notes (Signed)
CRITICAL VALUE ALERT  Critical value received:  Critical platelet count 39  Date of notification: 11/29/16   Time of notification:  [id number]   Critical value read back: yes  Nurse who received alert:  Real Cons, RN  MD notified (1st page):  Dr. Alesia Richards   Time of first page:  1715  MD notified (2nd page): Alesia Richards  Time of second page:1720  Responding MD: Alesia Richards  Time MD responded:  [place], RN

## 2016-11-29 NOTE — Brief Op Note (Signed)
11/29/2016  1:32 PM  PATIENT:  Heather [name]  41 y.o. female  PRE-OPERATIVE DIAGNOSIS:  Uterine Fibroids, Menorrhagia, Anemia, Infertility  POST-OPERATIVE DIAGNOSIS:  Uterine Fibroids, Menorrhagia, Anemia, Infertility  PROCEDURE:  Procedure(s) with comments: MYOMECTOMY (N/A) CHROMOPERTUBATION (Bilateral) - fallopian tubes  SURGEON:  Surgeon(s) and Role:    * Waymon Amato, MD - Primary  PHYSICIAN ASSISTANT: Earnstine Regal  ANESTHESIA:   general  EBL:  Total I/O In: [id number] [I.V.:3000; Blood:670; IV Piggyback:750] Out: 3600 [Urine:1100; Blood:2500]  BLOOD ADMINISTERED:2 units  CC PRBC  DRAINS: none   LOCAL MEDICATIONS USED:  NONE  SPECIMEN:  Source of Specimen:  Uterine fibroids  DISPOSITION OF SPECIMEN:  PATHOLOGY  COUNTS:  YES  TOURNIQUET:  * No tourniquets in log *  DICTATION: .Note written in EPIC  PLAN OF CARE: Admit to inpatient   PATIENT DISPOSITION:  PACU - hemodynamically stable.   Delay start of Pharmacological VTE agent (>24hrs) due to surgical blood loss or risk of bleeding: not applicable

## 2016-11-29 NOTE — Anesthesia Postprocedure Evaluation (Signed)
Anesthesia Post Note  Patient: Heather [name]  Procedure(s) Performed: Procedure(s) (LRB): MYOMECTOMY (N/A) CHROMOPERTUBATION (Bilateral)  Patient location during evaluation: PACU Anesthesia Type: General Level of consciousness: sedated Pain management: pain level controlled Vital Signs Assessment: post-procedure vital signs reviewed and stable Respiratory status: spontaneous breathing and respiratory function stable Cardiovascular status: stable Anesthetic complications: no        Last Vitals:  Vitals:   11/29/16 1515 11/29/16 1530  BP: 100/64 107/70  Pulse: 79 78  Resp: 15 14  Temp:      Last Pain:  Vitals:   11/29/16 1500  TempSrc:   PainSc: 3    Pain Goal: Patients Stated Pain Goal: 3 (11/29/16 0741)               Duane Boston DANIEL

## 2016-11-30 ENCOUNTER — Encounter (HOSPITAL_COMMUNITY): Payer: Self-pay | Admitting: Obstetrics & Gynecology

## 2016-11-30 LAB — PREPARE FRESH FROZEN PLASMA
BLOOD PRODUCT EXPIRATION DATE: 201802112359
ISSUE DATE / TIME: 201801251326
Unit Type and Rh: 600

## 2016-11-30 LAB — TYPE AND SCREEN
BLOOD PRODUCT EXPIRATION DATE: 201801292359
Blood Product Expiration Date: 201801292359
Blood Product Expiration Date: 201802032359
Blood Product Expiration Date: 201802032359
ISSUE DATE / TIME: 201801251055
ISSUE DATE / TIME: 201801251326
ISSUE DATE / TIME: 201801251055
ISSUE DATE / TIME: 201801251326
UNIT TYPE AND RH: 9500
UNIT TYPE AND RH: 9500
Unit Type and Rh: 5100
Unit Type and Rh: 5100

## 2016-11-30 LAB — PREPARE PLATELET PHERESIS
BLOOD PRODUCT EXPIRATION DATE: 201801252359
BLOOD PRODUCT EXPIRATION DATE: 201801282359
ISSUE DATE / TIME: 201801252020
ISSUE DATE / TIME: 201801252211
UNIT TYPE AND RH: 6200
UNIT TYPE AND RH: 6200

## 2016-11-30 LAB — CBC
HCT: 21 % — ABNORMAL LOW (ref 36.0–46.0)
Hemoglobin: 7.3 g/dL — ABNORMAL LOW (ref 12.0–15.0)
MCH: 26.6 pg (ref 26.0–34.0)
MCHC: 34.8 g/dL (ref 30.0–36.0)
MCV: 76.6 fL — AB (ref 78.0–100.0)
PLATELETS: 103 10*3/uL — AB (ref 150–400)
RBC: 2.74 MIL/uL — AB (ref 3.87–5.11)
RDW: 18.6 % — AB (ref 11.5–15.5)
WBC: 8.2 10*3/uL (ref 4.0–10.5)

## 2016-11-30 MED ORDER — IBUPROFEN 600 MG PO TABS
600.0000 mg | ORAL_TABLET | Freq: Four times a day (QID) | ORAL | Status: DC
  Administered 2016-11-30 – 2016-12-01 (×3): 600 mg via ORAL
  Filled 2016-11-30 (×3): qty 1

## 2016-11-30 NOTE — Progress Notes (Signed)
Heather [name] is a23 y.o.  [id number]  Post Op Date # 1; Abdominal Myomectomy  Subjective: Patient is Doing well postoperatively. Was transfused 4 units of PRBC and given Platelets. Patient has Pain is controlled with current analgesics. Medications being used: prescription NSAID's including Ketorolac 30 mg IV and narcotic analgesics including PCA Dilaudid. Patient remains in bed with no complaints of dizziness or nausea.  Tolerating ice chips and Foley remains  in place with 300 cc of clear urine.   Objective: Vital signs in last 24 hours: Temp:  [98.3 F (36.8 C)-98.9 F (37.2 C)] 98.4 F (36.9 C) (01/26 0530) Pulse Rate:  [63-114] 74 (01/26 0530) Resp:  [12-24] 14 (01/26 0653) BP: (88-131)/(40-97) 95/78 (01/26 0530) SpO2:  [99 %-100 %] 100 % (01/26 0653) Weight:  [130 lb (59 kg)] 130 lb (59 kg) (01/25 1700)  Intake/Output from previous day: 01/25 0701 - 01/26 0700 In: 7256 [I.V.:4125] Out: 5125 [Urine:2625] Intake/Output this shift: No intake/output data recorded.  Recent Labs Lab 11/24/16 0514  11/29/16 1154 11/29/16 1510 11/30/16 0642  WBC 4.3  --   --  11.4* 8.2  HGB 10.8*  < > 10.5* 8.7* 7.3*  HCT 34.9*  < > 31.0* 25.1* 21.0*  PLT 259  --   --  39* PENDING  < > = values in this interval not displayed.   Recent Labs Lab 11/29/16 1106 11/29/16 1154 11/29/16 1510  NA 139 139 136  K 4.4 4.6 4.2  CL  --   --  107  CO2  --   --  23  BUN  --   --  10  CREATININE  --   --  0.71  CALCIUM  --   --  7.2*  PROT  --   --  4.6*  BILITOT  --   --  1.3*  ALKPHOS  --   --  27*  ALT  --   --  16  AST  --   --  39  GLUCOSE  --   --  144*    EXAM: General: alert, cooperative and no distress Resp: clear to auscultation bilaterally Cardio: regular rate and rhythm, S1, S2 normal, no murmur, click, rub or gallop GI: Bowel sounds present in all 4 quadrants; dressing is intact with a small dried stain along mid-incision line. Extremities: Homans sign is negative, no  sign of DVT and SCD hose in place-functioning.   Assessment: s/p Procedure(s): MYOMECTOMY CHROMOPERTUBATION: stable, progressing well and anemia  Plan: Advance diet Encourage ambulation Advance to PO medication Discontinue Foley  Will hold Ibuprofen dose until report on Platelets are received  LOS: 1 day    POWELL,ELMIRA, PA-C 11/30/2016 7:36 AM   ADDENDUM:    CBC    Component Value Date/Time   WBC 8.2 11/30/2016 0642   RBC 2.74 (L) 11/30/2016 0642   HGB 7.3 (L) 11/30/2016 0642   HCT 21.0 (L) 11/30/2016 0642   PLT 103 (L) 11/30/2016 0642   MCV 76.6 (L) 11/30/2016 0642   MCH 26.6 11/30/2016 0642   MCHC 34.8 11/30/2016 0642   RDW 18.6 (H) 11/30/2016 [id number]    I saw and examined patient at bedside and agree with above findings, assessment and plan.  D/c IV fluid as patient is tolerating clears and voiding.  May restart ibuprofen as platelets have increased appropriately after transfusion. Ambulation encouraged.  Dr. Alesia Richards.

## 2016-11-30 NOTE — Anesthesia Postprocedure Evaluation (Addendum)
Anesthesia Post Note  Patient: Heather [name]  Procedure(s) Performed: Procedure(s) (LRB): MYOMECTOMY (N/A) CHROMOPERTUBATION (Bilateral)  Patient location during evaluation: [facility] Anesthesia Type: General Level of consciousness: awake and alert Pain management: pain level controlled Vital Signs Assessment: post-procedure vital signs reviewed and stable Respiratory status: spontaneous breathing Cardiovascular status: blood pressure returned to baseline Anesthetic complications: no        Last Vitals:  Vitals:   11/30/16 0749 11/30/16 1213  BP: 107/64 116/65  Pulse: 71 75  Resp: 16 18  Temp: 36.8 C 37.1 C    Last Pain:  Vitals:   11/30/16 1213  TempSrc: Oral  PainSc:    Pain Goal: Patients Stated Pain Goal: 3 (11/30/16 1157)               MARSHALL,BETH

## 2016-11-30 NOTE — Addendum Note (Signed)
Addendum  created 11/30/16 1343 by Talbot Grumbling, CRNA   Sign clinical note

## 2016-11-30 NOTE — Progress Notes (Signed)
Foley d/c'd. PCA d/c'd. Abdominal binder applied. POC discussed. The patient will order breakfast and will try to ambulate afterwards. Toya Smothers, RN

## 2016-12-01 MED ORDER — IBUPROFEN 600 MG PO TABS: 600.0000 mg | ORAL_TABLET | Freq: Four times a day (QID) | ORAL | 0 refills | Status: DC

## 2016-12-01 MED ORDER — OXYCODONE-ACETAMINOPHEN 5-325 MG PO TABS: 1.0000 | ORAL_TABLET | ORAL | 0 refills | Status: DC | PRN

## 2016-12-01 MED ORDER — DOCUSATE SODIUM 100 MG PO CAPS: 100.0000 mg | ORAL_CAPSULE | Freq: Two times a day (BID) | ORAL | 0 refills | Status: DC

## 2016-12-01 NOTE — Discharge Summary (Signed)
Physician Discharge Summary  Patient ID: Heather [name] MRN: [id number] DOB/AGE: [birth year] 40 y.o.  Admit date: 11/29/2016 Discharge date: 12/01/2016  Admission Diagnoses:   1. Fibroid Uterus 2. Menorrhagia 3. Anemia 4. Infertility   Discharge Diagnoses:  Same as above.   Discharged Condition: stable  Hospital Course: Patient was admitted and underwent an abdominal hysterectomy with midline infraumbilical vertical incision and fallopian tube chromopertubation, please see the operative report for more details.  In summary about 17 fibroids were removed, endometrium cavity was entered and a cesarean delivery is recommended in the future in case of pregnancy.  There was no spillage of methylene blue dye through the fallopian tubes.  Patient required 4 units PRBC transfusion, 1 unit FFP and 2 units platelets for acute intra-op blood loss.  She then became stable and by post op day 2 was tolerating a regular diet and passing flatus, voiding and ambulating without difficulty.  Her pain was well controlled and she had scant vaginal bleeding.  She was deemed stable for discharge.     Consults: None  Significant Diagnostic Studies: labs:  CBC    Component Value Date/Time   WBC 8.2 11/30/2016 0642   RBC 2.74 (L) 11/30/2016 0642   HGB 7.3 (L) 11/30/2016 0642   HCT 21.0 (L) 11/30/2016 0642   PLT 103 (L) 11/30/2016 0642   MCV 76.6 (L) 11/30/2016 0642   MCH 26.6 11/30/2016 0642   MCHC 34.8 11/30/2016 0642   RDW 18.6 (H) 11/30/2016 0642   CBC Latest Ref Rng & Units 11/30/2016 11/29/2016 11/29/2016  WBC 4.0 - 10.5 K/uL 8.2 11.4(H) -  Hemoglobin 12.0 - 15.0 g/dL 7.3(L) 8.7(L) 10.5(L)  Hematocrit 36.0 - 46.0 % 21.0(L) 25.1(L) 31.0(L)  Platelets 150 - 400 K/uL 103(L) 39(L) -   Discharge Exam: Blood pressure 119/61, pulse 80, temperature 99.6 F (37.6 C), temperature source Oral, resp. rate 18, height 5' (1.524 m), weight 59 kg (130 lb), last menstrual period 11/15/2016, SpO2 100 %. General  appearance: alert, cooperative and no distress Resp: clear to auscultation bilaterally Cardio: regular rate and rhythm, S1, S2 normal, no murmur, click, rub or gallop GI: soft, mild distension, appropriately tender to palpation, positive bowel sounds .   Extremities: Warm and well perfused, no edema, no calf tenderness bilaterally.   Disposition: 01-Home or Self Care  Discharge Instructions    Discharge patient    Complete by:  As directed    Discharge disposition:  01-Home or Self Care   Discharge patient date:  12/01/2016     Allergies as of 12/01/2016   No Known Allergies     Medication List    STOP taking these medications   methocarbamol 500 MG tablet Commonly known as:  ROBAXIN   tranexamic acid 650 MG Tabs tablet Commonly known as:  LYSTEDA     TAKE these medications   docusate sodium 100 MG capsule Commonly known as:  COLACE Take 1 capsule (100 mg total) by mouth 2 (two) times daily.   ferrous sulfate 325 (65 FE) MG tablet Take 325 mg by mouth 3 (three) times daily with meals.   ibuprofen 600 MG tablet Commonly known as:  ADVIL,MOTRIN Take 1 tablet (600 mg total) by mouth every 6 (six) hours. What changed:  when to take this  reasons to take this  Another medication with the same name was removed. Continue taking this medication, and follow the directions you see here.   multivitamin with minerals Tabs tablet Take 1 tablet by mouth daily.  oxyCODONE-acetaminophen 5-325 MG tablet Commonly known as:  PERCOCET/ROXICET Take 1-2 tablets by mouth every 4 (four) hours as needed for severe pain (moderate to severe pain (when tolerating fluids)).      Follow-up Information    [name], MD Follow up on 01/04/2017.   Specialty:  Obstetrics and Gynecology Why:  Appointment time is 8:45 a.m. Contact information: [place] [address] [phone]        [facility] Follow up.   Specialty:  Obstetrics and  Gynecology Why:  Return for staples removal in about and a half weeks, my office will call you with the appointment date.    Contact information: [address] [place] [address] [phone]           Signed: [name], MD.  12/01/2016, 9:06 AM

## 2016-12-01 NOTE — Discharge Instructions (Signed)
Call [facility] @ [phone] if:  You have a temperature greater than or equal to 100.4 degrees Farenheit orally that does not respond to ibuprofen or tylenol.  You have pain that is not made better by the pain medication given and taken as directed You have excessive bleeding or problems urinating  Take Colace (Docusate Sodium/Stool Softener) 100 mg 2-3 times daily while taking narcotic pain medicine to avoid constipation or until bowel movements are regular. Take iron supplementation of your choice 3 times a day for 6 months Take Ibuprofen with food as directed for 5 days then as needed for pain  You may drive after 2 weeks or sooner if your pain is well managed with non-   Uterine Fibroids Uterine fibroids are tissue masses (tumors). They are also called leiomyomas. They can develop inside of a womans womb (uterus). They can grow very large. Fibroids are not cancerous (benign). Most fibroids do not require medical treatment. Follow these instructions at home:  Keep all follow-up visits as told by your doctor. This is important.  Take medicines only as told by your doctor.  If you were prescribed a hormone treatment, take the hormone medicines exactly as told.  Do not take aspirin. It can cause bleeding.  Ask your doctor about taking iron pills and increasing the amount of dark green, leafy vegetables in your diet. These actions can help to boost your blood iron levels.  Pay close attention to your period. Tell your doctor about any changes, such as:  Increased blood flow. This may require you to use more pads or tampons than usual per month.  A change in the number of days that your period lasts per month.  A change in symptoms that come with your period, such as back pain or cramping in your belly area (abdomen). Contact a doctor if:  You have pain in your back or the area between your hip bones (pelvic area) that is not controlled by medicines.  You have pain  in your abdomen that is not controlled with medicines.  You have an increase in bleeding between and during periods.  You soak tampons or pads in a half hour or less.  You feel lightheaded.  You feel extra tired.  You feel weak. Get help right away if:  You pass out (faint).  You have a sudden increase in pelvic pain. This information is not intended to replace advice given to you by your health care provider. Make sure you discuss any questions you have with your health care provider. Document Released: 11/24/2010 Document Revised: 06/22/2016 Document Reviewed: 04/20/2014 Elsevier Interactive Patient Education  2017 [place]. narcotic pain medication.  You may walk up steps  You may shower  You may resume a regular diet  Keep incisions clean and dry; remove honeycomb dressing on 12/05/16. Do not lift over 15 pounds for 6 weeks Avoid anything in vagina for 6 weeks (or until after your post-operative visit)  My office will call you with an appointment for staple removal for about 1.5 weeks from now.

## 2016-12-01 NOTE — Progress Notes (Signed)
Pt out in wheelchair  Teaching complete  At 1035 am

## 2017-01-21 ENCOUNTER — Emergency Department (HOSPITAL_BASED_OUTPATIENT_CLINIC_OR_DEPARTMENT_OTHER)
Admission: EM | Admit: 2017-01-21 | Discharge: 2017-01-21 | Disposition: A | Discharge status: Home / Self Care | Payer: 59 | Attending: Emergency Medicine | Admitting: Emergency Medicine

## 2017-01-21 ENCOUNTER — Encounter (HOSPITAL_BASED_OUTPATIENT_CLINIC_OR_DEPARTMENT_OTHER): Payer: Self-pay

## 2017-01-21 DIAGNOSIS — K029 Dental caries, unspecified: Secondary | ICD-10-CM | POA: Diagnosis not present

## 2017-01-21 DIAGNOSIS — K0889 Other specified disorders of teeth and supporting structures: Secondary | ICD-10-CM | POA: Diagnosis present

## 2017-01-21 DIAGNOSIS — K047 Periapical abscess without sinus: Secondary | ICD-10-CM

## 2017-01-21 MED ORDER — AMOXICILLIN 500 MG PO CAPS: 1000.0000 mg | ORAL_CAPSULE | Freq: Two times a day (BID) | ORAL | 0 refills | Status: AC

## 2017-01-21 MED FILL — AMOXICILLIN 500 MG CAPSULE: 500 | 7 days supply | Qty: 28 | Fill #0

## 2017-01-21 NOTE — ED Triage Notes (Signed)
Pt reports right upper dental pain with known broken tooth/carie - set to be removed in 8 days. Pt has associated right sided facial swelling. Reports Motrin PTA.

## 2017-01-21 NOTE — ED Provider Notes (Signed)
[facility] Provider Note   CSN: [id number] Arrival date & time: 01/21/17  1113     History   Chief Complaint Chief Complaint  Patient presents with  . Dental Pain    HPI Heather [name] is a 41 y.o. female presents to the ED reporting right upper dental pain that started last night. Right-sided facial swelling was noted this morning. Patient has known two broken teeth with caries in this area. Patient tried to call her dentist this morning but they told her they could not see her and to report to the emergency department for evaluation and likely antibiotics. Patient took ibuprofen prior to arrival. She denies fevers, trismus, neck stiffness or rigidity. No h/o tobacco abuse or DM.  HPI  Past Medical History:  Diagnosis Date  . Anemia   . History of blood transfusion    childhood    Patient Active Problem List   Diagnosis Date Noted  . Fibroids 11/29/2016  . Severe anemia 11/23/2016  . Fibroid uterus 11/23/2016  . Menorrhagia 06/12/2016    Past Surgical History:  Procedure Laterality Date  . CHROMOPERTUBATION Bilateral 11/29/2016   Procedure: CHROMOPERTUBATION;  Surgeon: Waymon Amato, MD;  Location: [facility];  Service: Gynecology;  Laterality: Bilateral;  fallopian tubes  . ENDOMETRIAL BIOPSY    . MYOMECTOMY N/A 11/29/2016   Procedure: MYOMECTOMY;  Surgeon: Waymon Amato, MD;  Location: [facility];  Service: Gynecology;  Laterality: N/A;    OB History    Gravida Para Term Preterm AB Living   0 0 0 0 0 0   SAB TAB Ectopic Multiple Live Births   0 0 0 0 0       Home Medications    Prior to Admission medications   Medication Sig Start Date End Date Taking? Authorizing Provider  docusate sodium (COLACE) 100 MG capsule Take 1 capsule (100 mg total) by mouth 2 (two) times daily. 12/01/16  Yes Waymon Amato, MD  ferrous sulfate 325 (65 FE) MG tablet Take 325 mg by mouth 3 (three) times daily with meals.   Yes Historical Provider, MD  ibuprofen (ADVIL,MOTRIN) 600 MG  tablet Take 1 tablet (600 mg total) by mouth every 6 (six) hours. 12/01/16  Yes Waymon Amato, MD  Multiple Vitamin (MULTIVITAMIN WITH MINERALS) TABS tablet Take 1 tablet by mouth daily.   Yes Historical Provider, MD  tranexamic acid (LYSTEDA) 650 MG TABS tablet Take 1,300 mg by mouth 3 (three) times daily.   Yes Historical Provider, MD  amoxicillin (AMOXIL) 500 MG capsule Take 2 capsules (1,000 mg total) by mouth 2 (two) times daily. 01/21/17 01/28/17  Kinnie Feil, PA-C  oxyCODONE-acetaminophen (PERCOCET/ROXICET) 5-325 MG tablet Take 1-2 tablets by mouth every 4 (four) hours as needed for severe pain (moderate to severe pain (when tolerating fluids)). 12/01/16   Waymon Amato, MD    Family History Family History  Problem Relation Age of Onset  . Diabetes Mother   . Hypertension Mother     Social History Social History  Substance Use Topics  . Smoking status: Never Smoker  . Smokeless tobacco: Never Used  . Alcohol use Yes     Comment: occ     Allergies   Patient has no known allergies.   Review of Systems Review of Systems  Constitutional: Negative for fatigue.  HENT: Positive for facial swelling. Negative for congestion, drooling, sinus pain, sinus pressure, tinnitus and trouble swallowing.   Eyes: Negative for pain and redness.  Respiratory: Negative for shortness of breath.  Cardiovascular: Negative for chest pain.  Gastrointestinal: Negative for nausea.  Skin: Negative for wound.  Neurological: Positive for headaches.     Physical Exam Updated Vital Signs BP 127/77 (BP Location: Right Arm)   Pulse 81   Temp 98.2 F (36.8 C) (Oral)   Resp 16   Ht 5' (1.524 m)   Wt 58.1 kg   LMP 01/13/2017   SpO2 100%   BMI 25.00 kg/m   Physical Exam  Constitutional: She is oriented to person, place, and time. She appears well-developed and well-nourished. No distress.  HENT:  Head: Normocephalic and atraumatic. Head is without right periorbital erythema and without left  periorbital erythema.    Right Ear: External ear normal.  Left Ear: External ear normal.  Nose: Nose normal. No sinus tenderness. Right sinus exhibits no maxillary sinus tenderness and no frontal sinus tenderness. Left sinus exhibits no maxillary sinus tenderness and no frontal sinus tenderness.  Mouth/Throat: Uvula is midline, oropharynx is clear and moist and mucous membranes are normal. No trismus in the jaw. Abnormal dentition. Dental caries present. No uvula swelling. No oropharyngeal exudate, posterior oropharyngeal edema, posterior oropharyngeal erythema or tonsillar abscesses.    Very mild right-sided facial edema and tenderness above the right upper lip and below the right nostril not extending over to periorbital area.   Poor dentition.  Teeth #4 and #5 missing with surrounding tenderness but without surrounding erythema, edema, fluctuance.  No anterior neck edema, erythema or erythema. No sublingual edema or tenderness.  Soft palate flat without tenderness.  No trismus.   No pooling of oral secretions.  Phonation normal, no hot potato voice.  Mastoids without edema, erythema or tenderness.    Eyes: Conjunctivae and EOM are normal. Pupils are equal, round, and reactive to light. No scleral icterus.  Neck: Normal range of motion. Neck supple. No JVD present.  Cardiovascular: Normal rate, regular rhythm and normal heart sounds.   No murmur heard. Pulmonary/Chest: Effort normal and breath sounds normal. She has no wheezes.  Abdominal: Soft. There is no tenderness.  Musculoskeletal: Normal range of motion. She exhibits no deformity.  Lymphadenopathy:    She has no cervical adenopathy.  Neurological: She is alert and oriented to person, place, and time.  Skin: Skin is warm and dry. Capillary refill takes less than 2 seconds.  Psychiatric: She has a normal mood and affect. Her behavior is normal. Judgment and thought content normal.  Nursing note and vitals reviewed.    ED  Treatments / Results  Labs (all labs ordered are listed, but only abnormal results are displayed) Labs Reviewed - No data to display  EKG  EKG Interpretation None       Radiology No results found.  Procedures Procedures (including critical care time)  Medications Ordered in ED Medications - No data to display   Initial Impression / Assessment and Plan / ED Course  I have reviewed the triage vital signs and the nursing notes.  Pertinent labs & imaging results that were available during my care of the patient were reviewed by me and considered in my medical decision making (see chart for details).    Dental pain associated with dental cary and periapical abscess.  Patient afebrile, non toxic appearing, swallowing secretions well without hot potato voice. Exam unconcerning for Ludwig's angina or other deep tissue infection in neck. No gum line or dental abscess found that would warrant I&D today. As there is mild facial swelling, will treat with antibiotic. Urged patient to follow-up  with dentist, encouraged to follow up in 1-2 days for ultimate management of dental pain and overall dental health. Strict ED return precautions given. Pt is aware of red flag symptoms that would warrant return to ED for re-evaluation and further treatment. Patient voices understanding and is agreeable to plan. Pt has upcoming dentist appointment next week.  Final Clinical Impressions(s) / ED Diagnoses   Final diagnoses:  Pain, dental  Infected dental caries    New Prescriptions New Prescriptions   AMOXICILLIN (AMOXIL) 500 MG CAPSULE    Take 2 capsules (1,000 mg total) by mouth 2 (two) times daily.     Kinnie Feil, PA-C 01/21/17 [place], MD 02/02/17 [phone]

## 2017-01-21 NOTE — Discharge Instructions (Signed)
Your signs and symptoms are suggestive of a periapical abscess or abscesses.  You have been prescribed an antibiotic, please take this as prescribed.  It is very important that you follow up with a dentist as soon as possible for re-evaluation and further treatment.  Dental abscesses can quickly progress, return to ED if you have worsening facial swelling, fever, difficulty opening/closing your jaw or extension of swelling to eye area.   Take ibuprofen 600 mg three times a day for swelling and pain.

## 2017-04-06 NOTE — Addendum Note (Signed)
Addendum  created 04/06/17 0841 by Duane Boston, MD   Sign clinical note

## 2017-11-29 ENCOUNTER — Emergency Department (HOSPITAL_BASED_OUTPATIENT_CLINIC_OR_DEPARTMENT_OTHER): Payer: 59

## 2017-11-29 ENCOUNTER — Other Ambulatory Visit: Payer: Self-pay

## 2017-11-29 ENCOUNTER — Emergency Department (HOSPITAL_BASED_OUTPATIENT_CLINIC_OR_DEPARTMENT_OTHER)
Admission: EM | Admit: 2017-11-29 | Discharge: 2017-11-29 | Disposition: A | Discharge status: Home / Self Care | Payer: 59 | Attending: Emergency Medicine | Admitting: Emergency Medicine

## 2017-11-29 ENCOUNTER — Encounter (HOSPITAL_BASED_OUTPATIENT_CLINIC_OR_DEPARTMENT_OTHER): Payer: Self-pay | Admitting: Emergency Medicine

## 2017-11-29 DIAGNOSIS — R079 Chest pain, unspecified: Secondary | ICD-10-CM | POA: Insufficient documentation

## 2017-11-29 DIAGNOSIS — Z79899 Other long term (current) drug therapy: Secondary | ICD-10-CM | POA: Diagnosis not present

## 2017-11-29 LAB — CBC WITH DIFFERENTIAL/PLATELET
BASOS ABS: 0 10*3/uL (ref 0.0–0.1)
BASOS PCT: 0 %
EOS ABS: 0.1 10*3/uL (ref 0.0–0.7)
Eosinophils Relative: 2 %
HCT: 30.1 % — ABNORMAL LOW (ref 36.0–46.0)
HEMOGLOBIN: 9.4 g/dL — AB (ref 12.0–15.0)
LYMPHS PCT: 35 %
Lymphs Abs: 0.9 10*3/uL (ref 0.7–4.0)
MCH: 20.8 pg — ABNORMAL LOW (ref 26.0–34.0)
MCHC: 31.2 g/dL (ref 30.0–36.0)
MCV: 66.4 fL — ABNORMAL LOW (ref 78.0–100.0)
Monocytes Absolute: 0.4 10*3/uL (ref 0.1–1.0)
Monocytes Relative: 15 %
NEUTROS PCT: 48 %
Neutro Abs: 1.1 10*3/uL — ABNORMAL LOW (ref 1.7–7.7)
Platelets: 226 10*3/uL (ref 150–400)
RBC: 4.53 MIL/uL (ref 3.87–5.11)
RDW: 20.6 % — ABNORMAL HIGH (ref 11.5–15.5)
WBC: 2.5 10*3/uL — ABNORMAL LOW (ref 4.0–10.5)

## 2017-11-29 LAB — COMPREHENSIVE METABOLIC PANEL
ALBUMIN: 3.4 g/dL — AB (ref 3.5–5.0)
ALK PHOS: 55 U/L (ref 38–126)
ALT: 19 U/L (ref 14–54)
AST: 29 U/L (ref 15–41)
Anion gap: 6 (ref 5–15)
BUN: 8 mg/dL (ref 6–20)
CALCIUM: 8.9 mg/dL (ref 8.9–10.3)
CO2: 24 mmol/L (ref 22–32)
CREATININE: 0.68 mg/dL (ref 0.44–1.00)
Chloride: 104 mmol/L (ref 101–111)
GFR calc Af Amer: >60 mL/min (ref >60)
GFR calc non Af Amer: >60 mL/min (ref >60)
Glucose, Bld: 101 mg/dL — ABNORMAL HIGH (ref 65–99)
Potassium: 3.7 mmol/L (ref 3.5–5.1)
SODIUM: 134 mmol/L — AB (ref 135–145)
Total Bilirubin: 0.4 mg/dL (ref 0.3–1.2)
Total Protein: 9 g/dL — ABNORMAL HIGH (ref 6.5–8.1)

## 2017-11-29 LAB — TROPONIN I
Troponin I: <0.03 ng/mL (ref <0.03)
Troponin I: <0.03 ng/mL (ref <0.03)

## 2017-11-29 LAB — D-DIMER, QUANTITATIVE: D-Dimer, Quant: 0.58 ug/mL-FEU — ABNORMAL HIGH (ref 0.00–0.50)

## 2017-11-29 LAB — PREGNANCY, URINE: PREG TEST UR: NEGATIVE

## 2017-11-29 MED ORDER — ASPIRIN 81 MG PO CHEW
324.0000 mg | CHEWABLE_TABLET | Freq: Once | ORAL | Status: AC
  Administered 2017-11-29: 324 mg via ORAL
  Filled 2017-11-29: qty 4

## 2017-11-29 MED ORDER — IOPAMIDOL (ISOVUE-370) INJECTION 76%
100.0000 mL | Freq: Once | INTRAVENOUS | Status: AC | PRN
  Administered 2017-11-29: 100 mL via INTRAVENOUS

## 2017-11-29 NOTE — ED Notes (Signed)
Patient transported to x-ray. ?

## 2017-11-29 NOTE — ED Notes (Signed)
ED Provider at bedside. 

## 2017-11-29 NOTE — ED Triage Notes (Signed)
Patient reports chest pain which began last night.  States this is worse when walking or coughing.  Reports pain mid chest.  Describes this as sharp.  Also complains of left lower flank pain when coughing.

## 2017-11-29 NOTE — ED Provider Notes (Signed)
[facility] EMER[facility]ENCY DEPARTMENT Provider Note   CSN: [id number] Arrival date & time: 11/29/17  0906     History   Chief Complaint Chief Complaint  Patient presents with  . Chest Pain    HPI Heather [name] is a 42 y.o. female.  HPI  2 days ago was walking outside and suddenly had sharp pain, in center of chest, no radiation Yesterday noticed sharp pain in chest and back, worse with walking and coughing, worse with bending over, not worse with deep breaths, not worse with movements or palpation 10/10 pain, only with walking not with rest. Reports it is more the jarring steps of walking, not necessarily exertion in general.  Notices it with first step No dyspnea, nausea, diaphoresis No cough or fever Cough 3 weeks ago, every once in a while will cough but not much No leg pain or swelling, estrogen use, recent surgery or immobilization  Hx of fibroids and anemia, resolved after fibroid removal  No smoking or other drugs Mom has htn, dm, hx of CVA, no hx of CAD in family   Past Medical History:  Diagnosis Date  . Anemia   . History of blood transfusion    childhood    Patient Active Problem List   Diagnosis Date Noted  . Fibroids 11/29/2016  . Severe anemia 11/23/2016  . Fibroid uterus 11/23/2016  . Menorrhagia 06/12/2016    Past Surgical History:  Procedure Laterality Date  . CHROMOPERTUBATION Bilateral 11/29/2016   Procedure: CHROMOPERTUBATION;  Surgeon: Waymon Amato, MD;  Location: [facility];  Service: Gynecology;  Laterality: Bilateral;  fallopian tubes  . ENDOMETRIAL BIOPSY    . MYOMECTOMY N/A 11/29/2016   Procedure: MYOMECTOMY;  Surgeon: Waymon Amato, MD;  Location: [facility];  Service: Gynecology;  Laterality: N/A;    OB History    Gravida Para Term Preterm AB Living   0 0 0 0 0 0   SAB TAB Ectopic Multiple Live Births   0 0 0 0 0       Home Medications    Prior to Admission medications   Medication Sig Start Date End Date Taking? Authorizing  Provider  docusate sodium (COLACE) 100 MG capsule Take 1 capsule (100 mg total) by mouth 2 (two) times daily. 12/01/16   Waymon Amato, MD  ferrous sulfate 325 (65 FE) MG tablet Take 325 mg by mouth 3 (three) times daily with meals.    [provider]  ibuprofen (ADVIL,MOTRIN) 600 MG tablet Take 1 tablet (600 mg total) by mouth every 6 (six) hours. 12/01/16   Waymon Amato, MD  Multiple Vitamin (MULTIVITAMIN WITH MINERALS) TABS tablet Take 1 tablet by mouth daily.    [provider]  oxyCODONE-acetaminophen (PERCOCET/ROXICET) 5-325 MG tablet Take 1-2 tablets by mouth every 4 (four) hours as needed for severe pain (moderate to severe pain (when tolerating fluids)). 12/01/16   Waymon Amato, MD  tranexamic acid (LYSTEDA) 650 MG TABS tablet Take 1,300 mg by mouth 3 (three) times daily.    [provider]    Family History Family History  Problem Relation Age of Onset  . Diabetes Mother   . Hypertension Mother     Social History Social History   Tobacco Use  . Smoking status: Never Smoker  . Smokeless tobacco: Never Used  Substance Use Topics  . Alcohol use: Yes    Comment: occ  . Drug use: No     Allergies   Patient has no known allergies.   Review of  Systems Review of Systems  Constitutional: Negative for fever.  HENT: Negative for sore throat.   Eyes: Negative for visual disturbance.  Respiratory: Negative for cough and shortness of breath.   Cardiovascular: Positive for chest pain. Negative for leg swelling.  Gastrointestinal: Negative for abdominal pain, nausea and vomiting.  Genitourinary: Negative for difficulty urinating.  Musculoskeletal: Negative for back pain and neck pain.  Skin: Negative for rash.  Neurological: Negative for syncope, light-headedness and headaches.     Physical Exam Updated Vital Signs BP 113/85 (BP Location: Right Arm)   Pulse 94   Temp 98.7 F (37.1 C) (Oral)   Resp 20   Ht 5' (1.524 m)   Wt 59 kg (130 lb)   LMP  11/09/2017 (Exact Date)   SpO2 100%   BMI 25.39 kg/m   Physical Exam  Constitutional: She is oriented to person, place, and time. She appears well-developed and well-nourished. No distress.  HENT:  Head: Normocephalic and atraumatic.  Eyes: Conjunctivae and EOM are normal.  Neck: Normal range of motion.  Cardiovascular: Normal rate, regular rhythm, normal heart sounds and intact distal pulses. Exam reveals no gallop and no friction rub.  No murmur heard. Pulmonary/Chest: Effort normal and breath sounds normal. No respiratory distress. She has no wheezes. She has no rales.  Abdominal: Soft. She exhibits no distension. There is no tenderness. There is no guarding.  Musculoskeletal: She exhibits no edema or tenderness.  Neurological: She is alert and oriented to person, place, and time.  Skin: Skin is warm and dry. No rash noted. She is not diaphoretic. No erythema.  Nursing note and vitals reviewed.    ED Treatments / Results  Labs (all labs ordered are listed, but only abnormal results are displayed) Labs Reviewed  CBC WITH DIFFERENTIAL/PLATELET - Abnormal; Notable for the following components:      Result Value   WBC 2.5 (*)    Hemoglobin 9.4 (*)    HCT 30.1 (*)    MCV 66.4 (*)    MCH 20.8 (*)    RDW 20.6 (*)    Neutro Abs 1.1 (*)    All other components within normal limits  COMPREHENSIVE METABOLIC PANEL - Abnormal; Notable for the following components:   Sodium 134 (*)    Glucose, Bld 101 (*)    Total Protein 9.0 (*)    Albumin 3.4 (*)    All other components within normal limits  D-DIMER, QUANTITATIVE (NOT AT [facility]) - Abnormal; Notable for the following components:   D-Dimer, Quant 0.58 (*)    All other components within normal limits  TROPONIN I  PREGNANCY, URINE    EKG  EKG Interpretation  Date/Time:  Friday November 29 2017 09:15:32 EST Ventricular Rate:  95 PR Interval:    QRS Duration: 90 QT Interval:  337 QTC Calculation: 424 R Axis:   50 Text  Interpretation:  Sinus rhythm Low voltage, precordial leads Baseline wander in lead(s) V5 V6 No previous ECGs available Confirmed by Gareth Morgan [id number]) on 11/29/2017 9:31:17 AM       Radiology Dg Chest 2 View  Result Date: 11/29/2017 CLINICAL DATA:  Chest pain for 2 days.  Nonsmoker. EXAM: CHEST  2 VIEW COMPARISON:  None. FINDINGS: The heart size and mediastinal contours are within normal limits. Both lungs are clear. The visualized skeletal structures are unremarkable. IMPRESSION: No active cardiopulmonary disease. Electronically Signed   By: Kathreen Devoid   On: 11/29/2017 09:58    Procedures Procedures (including critical care time)  Medications Ordered in ED Medications  aspirin chewable tablet 324 mg (324 mg Oral Given 11/29/17 0957)     Initial Impression / Assessment and Plan / ED Course  I have reviewed the triage vital signs and the nursing notes.  Pertinent labs & imaging results that were available during my care of the patient were reviewed by me and considered in my medical decision making (see chart for details).     42yo female with no significant medical history presents with concern for chest pain.  Differential diagnosis for chest pain includes pulmonary embolus, dissection, pneumothorax, pneumonia, ACS, myocarditis, pericarditis.  EKG was done and evaluate by me and showed no acute ST changes and no signs of pericarditis. Chest x-ray was done and evaluated by me and radiology and showed no sign of pneumonia or pneumothorax.  DDimer positive and CT PE study done showing no significant findings. Patient is low risk HEART score (0-1) and had delta troponins which were both negative. Given this evaluation, history and physical have low suspicion for pulmonary embolus, pneumonia, ACS, myocarditis, pericarditis, dissection.   Recommend outpatient follow up for pain, ibuprofen, tylenol, return for worsening. Possible msk or other. Patient discharged in stable condition with  understanding of reasons to return.    Final Clinical Impressions(s) / ED Diagnoses   Final diagnoses:  Chest pain, unspecified type    ED Discharge Orders    None       Gareth Morgan, MD 11/29/17 [phone]

## 2020-04-06 ENCOUNTER — Encounter (HOSPITAL_BASED_OUTPATIENT_CLINIC_OR_DEPARTMENT_OTHER): Payer: Self-pay | Admitting: Emergency Medicine

## 2020-04-06 ENCOUNTER — Other Ambulatory Visit: Payer: Self-pay | Admitting: Family Medicine

## 2020-04-06 ENCOUNTER — Inpatient Hospital Stay (HOSPITAL_BASED_OUTPATIENT_CLINIC_OR_DEPARTMENT_OTHER)
Admission: EM | Admit: 2020-04-06 | Discharge: 2020-04-09 | DRG: 975 | Disposition: A | Discharge status: Home / Self Care | Payer: 59 | Attending: Internal Medicine | Admitting: Internal Medicine

## 2020-04-06 ENCOUNTER — Other Ambulatory Visit: Payer: Self-pay

## 2020-04-06 ENCOUNTER — Emergency Department (HOSPITAL_BASED_OUTPATIENT_CLINIC_OR_DEPARTMENT_OTHER): Payer: 59

## 2020-04-06 DIAGNOSIS — Z79899 Other long term (current) drug therapy: Secondary | ICD-10-CM

## 2020-04-06 DIAGNOSIS — J90 Pleural effusion, not elsewhere classified: Secondary | ICD-10-CM | POA: Diagnosis present

## 2020-04-06 DIAGNOSIS — R634 Abnormal weight loss: Secondary | ICD-10-CM | POA: Diagnosis present

## 2020-04-06 DIAGNOSIS — B20 Human immunodeficiency virus [HIV] disease: Secondary | ICD-10-CM | POA: Diagnosis present

## 2020-04-06 DIAGNOSIS — Z79891 Long term (current) use of opiate analgesic: Secondary | ICD-10-CM | POA: Diagnosis not present

## 2020-04-06 DIAGNOSIS — K8689 Other specified diseases of pancreas: Secondary | ICD-10-CM | POA: Diagnosis present

## 2020-04-06 DIAGNOSIS — K869 Disease of pancreas, unspecified: Secondary | ICD-10-CM | POA: Diagnosis present

## 2020-04-06 DIAGNOSIS — R0600 Dyspnea, unspecified: Secondary | ICD-10-CM

## 2020-04-06 DIAGNOSIS — Z791 Long term (current) use of non-steroidal anti-inflammatories (NSAID): Secondary | ICD-10-CM

## 2020-04-06 DIAGNOSIS — R231 Pallor: Secondary | ICD-10-CM | POA: Diagnosis present

## 2020-04-06 DIAGNOSIS — C7889 Secondary malignant neoplasm of other digestive organs: Secondary | ICD-10-CM | POA: Diagnosis present

## 2020-04-06 DIAGNOSIS — D649 Anemia, unspecified: Secondary | ICD-10-CM

## 2020-04-06 DIAGNOSIS — B1910 Unspecified viral hepatitis B without hepatic coma: Secondary | ICD-10-CM

## 2020-04-06 DIAGNOSIS — Z20822 Contact with and (suspected) exposure to covid-19: Secondary | ICD-10-CM | POA: Diagnosis present

## 2020-04-06 DIAGNOSIS — D259 Leiomyoma of uterus, unspecified: Secondary | ICD-10-CM | POA: Diagnosis present

## 2020-04-06 DIAGNOSIS — B181 Chronic viral hepatitis B without delta-agent: Secondary | ICD-10-CM | POA: Diagnosis present

## 2020-04-06 DIAGNOSIS — C8339 Diffuse large B-cell lymphoma, extranodal and solid organ sites: Principal | ICD-10-CM | POA: Diagnosis present

## 2020-04-06 DIAGNOSIS — K5900 Constipation, unspecified: Secondary | ICD-10-CM | POA: Diagnosis present

## 2020-04-06 DIAGNOSIS — Z833 Family history of diabetes mellitus: Secondary | ICD-10-CM

## 2020-04-06 DIAGNOSIS — R6881 Early satiety: Secondary | ICD-10-CM | POA: Diagnosis present

## 2020-04-06 DIAGNOSIS — Z8049 Family history of malignant neoplasm of other genital organs: Secondary | ICD-10-CM

## 2020-04-06 DIAGNOSIS — K311 Adult hypertrophic pyloric stenosis: Secondary | ICD-10-CM | POA: Diagnosis present

## 2020-04-06 DIAGNOSIS — R109 Unspecified abdominal pain: Secondary | ICD-10-CM | POA: Diagnosis present

## 2020-04-06 DIAGNOSIS — D509 Iron deficiency anemia, unspecified: Secondary | ICD-10-CM | POA: Diagnosis present

## 2020-04-06 DIAGNOSIS — R590 Localized enlarged lymph nodes: Secondary | ICD-10-CM | POA: Diagnosis present

## 2020-04-06 DIAGNOSIS — R188 Other ascites: Secondary | ICD-10-CM | POA: Diagnosis present

## 2020-04-06 DIAGNOSIS — Z8249 Family history of ischemic heart disease and other diseases of the circulatory system: Secondary | ICD-10-CM | POA: Diagnosis not present

## 2020-04-06 DIAGNOSIS — R1310 Dysphagia, unspecified: Secondary | ICD-10-CM | POA: Diagnosis present

## 2020-04-06 DIAGNOSIS — Z9889 Other specified postprocedural states: Secondary | ICD-10-CM

## 2020-04-06 HISTORY — DX: Chronic viral hepatitis B without delta-agent: B18.1

## 2020-04-06 HISTORY — DX: Immunodeficiency, unspecified: D84.9

## 2020-04-06 HISTORY — DX: Asymptomatic human immunodeficiency virus (hiv) infection status: Z21

## 2020-04-06 HISTORY — DX: Human immunodeficiency virus (HIV) disease: B20

## 2020-04-06 LAB — CBC WITH DIFFERENTIAL/PLATELET
Abs Immature Granulocytes: 0.03 10*3/uL (ref 0.00–0.07)
Basophils Absolute: 0 10*3/uL (ref 0.0–0.1)
Basophils Relative: 1 %
Eosinophils Absolute: 0.2 10*3/uL (ref 0.0–0.5)
Eosinophils Relative: 3 %
HCT: 29.5 % — ABNORMAL LOW (ref 36.0–46.0)
Hemoglobin: 9.3 g/dL — ABNORMAL LOW (ref 12.0–15.0)
Immature Granulocytes: 0 %
Lymphocytes Relative: 9 %
Lymphs Abs: 0.6 10*3/uL — ABNORMAL LOW (ref 0.7–4.0)
MCH: 21.2 pg — ABNORMAL LOW (ref 26.0–34.0)
MCHC: 31.5 g/dL (ref 30.0–36.0)
MCV: 67.4 fL — ABNORMAL LOW (ref 80.0–100.0)
Monocytes Absolute: 1.1 10*3/uL — ABNORMAL HIGH (ref 0.1–1.0)
Monocytes Relative: 16 %
Neutro Abs: 5.1 10*3/uL (ref 1.7–7.7)
Neutrophils Relative %: 71 %
Platelets: 554 10*3/uL — ABNORMAL HIGH (ref 150–400)
RBC: 4.38 MIL/uL (ref 3.87–5.11)
RDW: 17.1 % — ABNORMAL HIGH (ref 11.5–15.5)
Smear Review: NORMAL
WBC: 7 10*3/uL (ref 4.0–10.5)
nRBC: 0 % (ref 0.0–0.2)

## 2020-04-06 LAB — URINALYSIS, ROUTINE W REFLEX MICROSCOPIC
Bilirubin Urine: NEGATIVE
Glucose, UA: NEGATIVE mg/dL
Ketones, ur: NEGATIVE mg/dL
Leukocytes,Ua: NEGATIVE
Nitrite: NEGATIVE
Protein, ur: NEGATIVE mg/dL
Specific Gravity, Urine: 1.02 (ref 1.005–1.030)
pH: 6 (ref 5.0–8.0)

## 2020-04-06 LAB — COMPREHENSIVE METABOLIC PANEL
ALT: 12 U/L (ref 0–44)
AST: 21 U/L (ref 15–41)
Albumin: 3 g/dL — ABNORMAL LOW (ref 3.5–5.0)
Alkaline Phosphatase: 87 U/L (ref 38–126)
Anion gap: 11 (ref 5–15)
BUN: 10 mg/dL (ref 6–20)
CO2: 25 mmol/L (ref 22–32)
Calcium: 9.4 mg/dL (ref 8.9–10.3)
Chloride: 101 mmol/L (ref 98–111)
Creatinine, Ser: 0.74 mg/dL (ref 0.44–1.00)
GFR calc Af Amer: >60 mL/min (ref >60)
GFR calc non Af Amer: >60 mL/min (ref >60)
Glucose, Bld: 126 mg/dL — ABNORMAL HIGH (ref 70–99)
Potassium: 3.9 mmol/L (ref 3.5–5.1)
Sodium: 137 mmol/L (ref 135–145)
Total Bilirubin: 0.4 mg/dL (ref 0.3–1.2)
Total Protein: 7.2 g/dL (ref 6.5–8.1)

## 2020-04-06 LAB — PREGNANCY, URINE: Preg Test, Ur: NEGATIVE

## 2020-04-06 LAB — LIPASE, BLOOD: Lipase: 123 U/L — ABNORMAL HIGH (ref 11–51)

## 2020-04-06 LAB — SARS CORONAVIRUS 2 BY RT PCR (HOSPITAL ORDER, PERFORMED IN ~~LOC~~ HOSPITAL LAB): SARS Coronavirus 2: NEGATIVE

## 2020-04-06 LAB — URINALYSIS, MICROSCOPIC (REFLEX): WBC, UA: NONE SEEN WBC/hpf (ref 0–5)

## 2020-04-06 MED ORDER — ACETAMINOPHEN 650 MG RE SUPP: 650.0000 mg | Freq: Four times a day (QID) | RECTAL | Status: DC | PRN

## 2020-04-06 MED ORDER — PANTOPRAZOLE SODIUM 40 MG IV SOLR
40.0000 mg | Freq: Two times a day (BID) | INTRAVENOUS | Status: DC
  Administered 2020-04-06 – 2020-04-09 (×6): 40 mg via INTRAVENOUS
  Filled 2020-04-06 (×6): qty 40

## 2020-04-06 MED ORDER — ONDANSETRON HCL 4 MG PO TABS: 4.0000 mg | ORAL_TABLET | Freq: Four times a day (QID) | ORAL | Status: DC | PRN

## 2020-04-06 MED ORDER — ALUM & MAG HYDROXIDE-SIMETH 200-200-20 MG/5ML PO SUSP
30.0000 mL | Freq: Once | ORAL | Status: AC
  Administered 2020-04-06: 30 mL via ORAL
  Filled 2020-04-06: qty 30

## 2020-04-06 MED ORDER — MORPHINE SULFATE (PF) 2 MG/ML IV SOLN: 1.0000 mg | INTRAVENOUS | Status: DC | PRN

## 2020-04-06 MED ORDER — PANTOPRAZOLE SODIUM 40 MG PO TBEC
40.0000 mg | DELAYED_RELEASE_TABLET | Freq: Once | ORAL | Status: AC
  Administered 2020-04-06: 40 mg via ORAL
  Filled 2020-04-06: qty 1

## 2020-04-06 MED ORDER — BICTEGRAVIR-EMTRICITAB-TENOFOV 50-200-25 MG PO TABS
1.0000 | ORAL_TABLET | Freq: Every day | ORAL | Status: DC
  Administered 2020-04-07 – 2020-04-09 (×3): 1 via ORAL
  Filled 2020-04-06 (×3): qty 1

## 2020-04-06 MED ORDER — FERROUS SULFATE 325 (65 FE) MG PO TABS: 325.0000 mg | ORAL_TABLET | Freq: Three times a day (TID) | ORAL | Status: DC

## 2020-04-06 MED ORDER — ONDANSETRON HCL 4 MG/2ML IJ SOLN
4.0000 mg | Freq: Once | INTRAMUSCULAR | Status: AC
  Administered 2020-04-06: 4 mg via INTRAVENOUS
  Filled 2020-04-06: qty 2

## 2020-04-06 MED ORDER — IOHEXOL 300 MG/ML  SOLN
100.0000 mL | Freq: Once | INTRAMUSCULAR | Status: AC | PRN
  Administered 2020-04-06: 100 mL via INTRAVENOUS

## 2020-04-06 MED ORDER — ONDANSETRON HCL 4 MG/2ML IJ SOLN
4.0000 mg | Freq: Four times a day (QID) | INTRAMUSCULAR | Status: DC | PRN
  Administered 2020-04-07: 4 mg via INTRAVENOUS
  Filled 2020-04-06 (×2): qty 2

## 2020-04-06 MED ORDER — FAMOTIDINE 20 MG PO TABS
20.0000 mg | ORAL_TABLET | Freq: Once | ORAL | Status: AC
  Administered 2020-04-06: 20 mg via ORAL
  Filled 2020-04-06: qty 1

## 2020-04-06 MED ORDER — LIDOCAINE VISCOUS HCL 2 % MT SOLN
15.0000 mL | Freq: Once | OROMUCOSAL | Status: AC
  Administered 2020-04-06: 15 mL via OROMUCOSAL
  Filled 2020-04-06: qty 15

## 2020-04-06 MED ORDER — OXYCODONE-ACETAMINOPHEN 5-325 MG PO TABS
1.0000 | ORAL_TABLET | ORAL | Status: DC | PRN
  Filled 2020-04-06: qty 1

## 2020-04-06 MED ORDER — DEXTROSE IN LACTATED RINGERS 5 % IV SOLN: INTRAVENOUS | Status: DC

## 2020-04-06 MED ORDER — NORGESTIMATE-ETH ESTRADIOL 0.25-35 MG-MCG PO TABS: 1.0000 | ORAL_TABLET | Freq: Every day | ORAL | Status: DC

## 2020-04-06 MED ORDER — DOCUSATE SODIUM 100 MG PO CAPS
100.0000 mg | ORAL_CAPSULE | Freq: Two times a day (BID) | ORAL | Status: DC
  Administered 2020-04-06 – 2020-04-09 (×4): 100 mg via ORAL
  Filled 2020-04-06 (×5): qty 1

## 2020-04-06 MED ORDER — LACTATED RINGERS IV BOLUS
1000.0000 mL | Freq: Once | INTRAVENOUS | Status: AC
  Administered 2020-04-06: 1000 mL via INTRAVENOUS

## 2020-04-06 MED ORDER — ADULT MULTIVITAMIN W/MINERALS CH
1.0000 | ORAL_TABLET | Freq: Every day | ORAL | Status: DC
  Administered 2020-04-06 – 2020-04-08 (×3): 1 via ORAL
  Filled 2020-04-06 (×3): qty 1

## 2020-04-06 MED ORDER — TRAMADOL HCL 50 MG PO TABS
50.0000 mg | ORAL_TABLET | Freq: Once | ORAL | Status: AC
  Administered 2020-04-06: 50 mg via ORAL
  Filled 2020-04-06: qty 1

## 2020-04-06 MED ORDER — TRANEXAMIC ACID 650 MG PO TABS: 1300.0000 mg | ORAL_TABLET | Freq: Three times a day (TID) | ORAL | Status: DC

## 2020-04-06 MED ORDER — ENOXAPARIN SODIUM 40 MG/0.4ML ~~LOC~~ SOLN
40.0000 mg | SUBCUTANEOUS | Status: DC
  Administered 2020-04-06: 40 mg via SUBCUTANEOUS
  Filled 2020-04-06: qty 0.4

## 2020-04-06 MED ORDER — ACETAMINOPHEN 325 MG PO TABS
650.0000 mg | ORAL_TABLET | Freq: Four times a day (QID) | ORAL | Status: DC | PRN
  Administered 2020-04-06 – 2020-04-08 (×5): 650 mg via ORAL
  Filled 2020-04-06 (×5): qty 2

## 2020-04-06 NOTE — H&P (Signed)
History and Physical    Shakeria Opie [id number] DOB: October 17, 1976 DOA: 04/06/2020  PCP: Katherina Mires, MD   Patient coming from: [facility]  Chief Complaint:  Abdominal pain.   HPI: Heather [name] is a 44 y.o. female with medical history significant of hepatitis B, HIV and chronic anemia who presents with abdominal pain.  Patient reports 2 weeks of early satiety, poor appetite and 5 pound weight loss.  She has been experiencing postprandial abdominal pain mainly at night after dinner and while laying in bed.  The pain has been moderate in intensity, has been occurring almost every night in frequency, has been associated with nausea and improves after vomiting.  Seems to be worse while supine.  No associated fevers or chills.  Due to persistent symptoms she presented to [facility] where she underwent CT of the abdomen and pelvis showing an extensive heterogeneous mass involving nearly entire of the pancreas, concerning for neoplasm or lymphoma.  Positive retroperitoneal adenopathy.  Diffuse thickening of the proximal stomach.  ED Course: NA   Review of Systems:  1. General: No fevers, no chills, positive weight loss 5 lbs in 2 weeks 2. ENT: No runny nose or sore throat, no hearing disturbances 3. Pulmonary: No dyspnea, cough, wheezing, or hemoptysis 4. Cardiovascular: No angina, claudication, lower extremity edema, pnd or orthopnea 5. Gastrointestinal: positive nausea and vomiting as mentioned in the HPI, no diarrhea or constipation 6. Hematology: No easy bruisability or frequent infections 7. Urology: No dysuria, hematuria or increased urinary frequency 8. Dermatology: No rashes. 9. Neurology: No seizures or paresthesias 10. Musculoskeletal: No joint pain or deformities  Past Medical History:  Diagnosis Date  . Anemia   . 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 History of blood transfusion    childhood  . HIV (human immunodeficiency virus infection) ([place])   . Immune deficiency  disorder [facility])     Past Surgical History:  Procedure Laterality Date  . CHROMOPERTUBATION Bilateral 11/29/2016   Procedure: CHROMOPERTUBATION;  Surgeon: Waymon Amato, MD;  Location: [facility];  Service: Gynecology;  Laterality: Bilateral;  fallopian tubes  . ENDOMETRIAL BIOPSY    . MYOMECTOMY N/A 11/29/2016   Procedure: MYOMECTOMY;  Surgeon: Waymon Amato, MD;  Location: [facility];  Service: Gynecology;  Laterality: N/A;     reports that she has never smoked. She has never used smokeless tobacco. She reports current alcohol use. She reports that she does not use drugs.  No Known Allergies  Family History  Problem Relation Age of Onset  . Diabetes Mother   . Hypertension Mother      Prior to Admission medications   Medication Sig Start Date End Date Taking? Authorizing Provider  docusate sodium (COLACE) 100 MG capsule Take 1 capsule (100 mg total) by mouth 2 (two) times daily. 12/01/16   Waymon Amato, MD  ferrous sulfate 325 (65 FE) MG tablet Take 325 mg by mouth 3 (three) times daily with meals.    [provider]  ibuprofen (ADVIL,MOTRIN) 600 MG tablet Take 1 tablet (600 mg total) by mouth every 6 (six) hours. 12/01/16   Waymon Amato, MD  Multiple Vitamin (MULTIVITAMIN WITH MINERALS) TABS tablet Take 1 tablet by mouth daily.    [provider]  oxyCODONE-acetaminophen (PERCOCET/ROXICET) 5-325 MG tablet Take 1-2 tablets by mouth every 4 (four) hours as needed for severe pain (moderate to severe pain (when tolerating fluids)). 12/01/16   Waymon Amato, MD  [place] 28 0.25-35 MG-MCG tablet Take 1 tablet by  mouth daily. 02/26/20   [provider]  tranexamic acid (LYSTEDA) 650 MG TABS tablet Take 1,300 mg by mouth 3 (three) times daily.    [provider]    Physical Exam: Vitals:   04/06/20 0546 04/06/20 1312 04/06/20 1521 04/06/20 1610  BP: (!) 135/95 (!) 135/91  132/86  Pulse: 96 (!) 108  (!) 112  Resp: 14 19  18   Temp:   98.7 F (37.1 C) 98.6 F (37 C)  TempSrc:    Oral Oral  SpO2: 98% 98%  99%  Weight:      Height:        Vitals:   04/06/20 0546 04/06/20 1312 04/06/20 1521 04/06/20 1610  BP: (!) 135/95 (!) 135/91  132/86  Pulse: 96 (!) 108  (!) 112  Resp: 14 19  18   Temp:   98.7 F (37.1 C) 98.6 F (37 C)  TempSrc:   Oral Oral  SpO2: 98% 98%  99%  Weight:      Height:       General: deconditioned  Neurology: Awake and alert, non focal Head and Neck. Head normocephalic. Neck supple with no adenopathy or thyromegaly.   E ENT: mild pallor, no icterus, oral mucosa moist Cardiovascular: No JVD. S1-S2 present, rhythmic, no gallops, rubs, or murmurs. No lower extremity edema. Pulmonary: positive breath sounds bilaterally, adequate air movement, no wheezing, rhonchi or rales. Gastrointestinal. Abdomen mild distended with no organomegaly, non tender, no rebound or guarding Skin. No rashes Musculoskeletal: no joint deformities    Labs on Admission: I have personally reviewed following labs and imaging studies  CBC: Recent Labs  Lab 04/06/20 0350  WBC 7.0  NEUTROABS 5.1  HGB 9.3*  HCT 29.5*  MCV 67.4*  PLT [id number]*   Basic Metabolic Panel: Recent Labs  Lab 04/06/20 0350  NA 137  K 3.9  CL 101  CO2 25  GLUCOSE 126*  BUN 10  CREATININE 0.74  CALCIUM 9.4   GFR: Estimated Creatinine Clearance: 73.7 mL/min (by C-G formula based on SCr of 0.74 mg/dL). Liver Function Tests: Recent Labs  Lab 04/06/20 0350  AST 21  ALT 12  ALKPHOS 87  BILITOT 0.4  PROT 7.2  ALBUMIN 3.0*   Recent Labs  Lab 04/06/20 0350  LIPASE 123*   No results for input(s): AMMONIA in the last 168 hours. Coagulation Profile: No results for input(s): INR, PROTIME in the last 168 hours. Cardiac Enzymes: No results for input(s): CKTOTAL, CKMB, CKMBINDEX, TROPONINI in the last 168 hours. BNP (last 3 results) No results for input(s): PROBNP in the last 8760 hours. HbA1C: No results for input(s): HGBA1C in the last 72 hours. CBG: No results for  input(s): GLUCAP in the last 168 hours. Lipid Profile: No results for input(s): CHOL, HDL, LDLCALC, TRIG, CHOLHDL, LDLDIRECT in the last 72 hours. Thyroid Function Tests: No results for input(s): TSH, T4TOTAL, FREET4, T3FREE, THYROIDAB in the last 72 hours. Anemia Panel: No results for input(s): VITAMINB12, FOLATE, FERRITIN, TIBC, IRON, RETICCTPCT in the last 72 hours. Urine analysis:    Component Value Date/Time   COLORURINE YELLOW 04/06/2020 0350   APPEARANCEUR CLEAR 04/06/2020 0350   LABSPEC 1.020 04/06/2020 0350   PHURINE 6.0 04/06/2020 0350   GLUCOSEU NEGATIVE 04/06/2020 0350   HGBUR MODERATE (A) 04/06/2020 0350   BILIRUBINUR NEGATIVE 04/06/2020 0350   KETONESUR NEGATIVE 04/06/2020 0350   PROTEINUR NEGATIVE 04/06/2020 0350   NITRITE NEGATIVE 04/06/2020 0350   LEUKOCYTESUR NEGATIVE 04/06/2020 0350    Radiological Exams on  Admission: CT ABDOMEN PELVIS W CONTRAST  Result Date: 04/06/2020 CLINICAL DATA:  Periodically epigastric pain EXAM: CT ABDOMEN AND PELVIS WITH CONTRAST TECHNIQUE: Multidetector CT imaging of the abdomen and pelvis was performed using the standard protocol following bolus administration of intravenous contrast. CONTRAST:  110mL OMNIPAQUE IOHEXOL 300 MG/ML  SOLN COMPARISON:  None. FINDINGS: Lower chest: The visualized heart size within normal limits. No pericardial fluid/thickening. No hiatal hernia. There is a small to moderate left pleural effusion with basilar atelectasis. Hepatobiliary: The liver is normal in density without focal abnormality.The main portal vein is patent. No evidence of calcified gallstones, gallbladder wall thickening or biliary dilatation. Pancreas: There is a extensive heterogeneously enhancing mass involving nearly the entirety of the pancreatic body and tail. Only a small portion of the pancreatic head appears to be spared. The SMV and SMA appear to be patent. No areas of internal necrosis seen within the mass. The mass appears to extend into  the splenic hilum. Spleen: Extensive large multiple heterogeneously enhancing hypodense masses seen throughout the splenic parenchyma likely from the extension of the pancreatic mass which extends through the splenic hilum. There is diffuse splenomegaly. The splenic vein appears to be narrowed wall entering the splenic hilum. Adrenals/Urinary Tract: Both adrenal glands appear normal. The kidneys and collecting system appear normal without evidence of urinary tract calculus or hydronephrosis. Bladder is unremarkable. Stomach/Bowel: There appears to be diffuse wall thickening seen within the proximal stomach with with the heterogeneous mass seen encompassing the proximal portion. The remainder of the small-bowel and colon are unremarkable. There is a moderate amount of colonic stool present. Vascular/Lymphatic: There is a left periaortic nodule/lymph node causing mild displacement of the third portion of the duodenum measuring 2.8 x 2.7 cm. Scattered small left-sided periaortic lymph nodes are also noted. Reproductive: Extensively enlarged uterus with multiple hypodense fibroids are seen throughout as on a prior MRI dating back to 2017. Small amount of fluid within the endometrial canal. Other: Small amount of abdominopelvic ascites is seen. Musculoskeletal: No acute or significant osseous findings. 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 Retroperitoneal adenopathy, consistent with metastatic disease Diffuse wall thickening seen within the proximal stomach which could either be related to gastritis or possible metastatic disease. Small amount of abdominopelvic ascites Enlarged uterus with multiple uterine fibroids These results were called by telephone at the time of interpretation on 04/06/2020 at  5:19 am to provider [facility] , who verbally acknowledged these results. Electronically Signed   By: Prudencio Pair M.D.   On: 04/06/2020 05:19    EKG: Independently reviewed. NA  Assessment/Plan Principal Problem:   Pancreatic mass Active Problems:   Severe anemia   Fibroid uterus   Hepatitis B   HIV (human immunodeficiency virus infection) ([place])   44 year old female with significant past medical history for HIV/AIDS and chronic hepatitis B who presents with abdominal pain, urinary satiety and postprandial vomiting.  Initial physical examination she is afebrile, blood pressure 132/86, heart rate 112, respiratory rate 18, oxygen saturation 99% on room air.  She has mild pallor, her lungs are clear to auscultation bilaterally, heart S1-S2, present rhythmic, soft abdomen, mildly distended, no lower extremity edema. Sodium 137, potassium 3.9, chloride 101, bicarb 25, glucose 126, BUN 10, creatinine 0.74, white count 7.0, hemoglobin 9.3, hematocrit 29.5, platelets 554.  SARS COVID-19 negative.  Urine  analysis negative for infection. CT of the abdomen pelvis with extensively a large heterogeneous mass involving nearly the entire pancreas.  Retroperitoneal adenopathy.  Splenic metastasis.  Diffuse wall thickening proximal stomach.  Patient admitted to the hospital with a working diagnosis of newly diagnosed large pancreatic mass with splenic metastases and retroperitoneal adenopathy.  1.  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  Patient has been admitted to telemetry ward, continue supportive medical therapy with intravenous fluids, intravenous antiacids, as needed analgesics and antiemetics.  Will consult interventional radiology for possible biopsy of the pancreas.  Considering her history of HIV, lymphoma is possible diagnosis.  2.  HIV AIDS.  She follows up with [facility], last visit 03/2420.  Her disease has been stable  on antiretroviral therapy.  She is currently on Biktarvy, recent CD4 and VL were 180 (CD4% 17,7) and 24.3 respectively (09/2019)  3.  Iron deficiency anemia.  Clinically stable.  Status is: Inpatient  Remains inpatient appropriate because:IV treatments appropriate due to intensity of illness or inability to take PO   Dispo: The patient is from: Home              Anticipated d/c is to: Home              Anticipated d/c date is: 3 days              Patient currently is not medically stable to d/c.     DVT prophylaxis: Enoxaparin   Code Status:   full  Family Communication:  I spoke with patient's husband at the bedside, we talked in detail about patient's condition, plan of care and prognosis and all questions were addressed.     Consults called:  IR   Admission status:  Inpatient    Dorthula Bier Gerome Apley MD [facility]   04/06/2020, 5:12 PM

## 2020-04-06 NOTE — ED Notes (Signed)
Patient transported to CT 

## 2020-04-06 NOTE — ED Notes (Signed)
ED Provider at bedside. 

## 2020-04-06 NOTE — ED Triage Notes (Signed)
Epigastric pain x 2 weeks, worse at hs and after eating. Pain eases off after vomiting

## 2020-04-07 ENCOUNTER — Inpatient Hospital Stay (HOSPITAL_COMMUNITY): Payer: 59

## 2020-04-07 ENCOUNTER — Encounter (HOSPITAL_COMMUNITY): Payer: Self-pay | Admitting: Family Medicine

## 2020-04-07 DIAGNOSIS — D509 Iron deficiency anemia, unspecified: Secondary | ICD-10-CM

## 2020-04-07 DIAGNOSIS — R590 Localized enlarged lymph nodes: Secondary | ICD-10-CM

## 2020-04-07 DIAGNOSIS — B181 Chronic viral hepatitis B without delta-agent: Secondary | ICD-10-CM

## 2020-04-07 LAB — COMPREHENSIVE METABOLIC PANEL
ALT: 11 U/L (ref 0–44)
AST: 25 U/L (ref 15–41)
Albumin: 2.9 g/dL — ABNORMAL LOW (ref 3.5–5.0)
Alkaline Phosphatase: 83 U/L (ref 38–126)
Anion gap: 10 (ref 5–15)
BUN: <5 mg/dL — ABNORMAL LOW (ref 6–20)
CO2: 25 mmol/L (ref 22–32)
Calcium: 9.1 mg/dL (ref 8.9–10.3)
Chloride: 100 mmol/L (ref 98–111)
Creatinine, Ser: 0.68 mg/dL (ref 0.44–1.00)
GFR calc Af Amer: >60 mL/min (ref >60)
GFR calc non Af Amer: >60 mL/min (ref >60)
Glucose, Bld: 120 mg/dL — ABNORMAL HIGH (ref 70–99)
Potassium: 4 mmol/L (ref 3.5–5.1)
Sodium: 135 mmol/L (ref 135–145)
Total Bilirubin: 0.5 mg/dL (ref 0.3–1.2)
Total Protein: 6.5 g/dL (ref 6.5–8.1)

## 2020-04-07 LAB — CBC
HCT: 30.4 % — ABNORMAL LOW (ref 36.0–46.0)
Hemoglobin: 9.5 g/dL — ABNORMAL LOW (ref 12.0–15.0)
MCH: 21.7 pg — ABNORMAL LOW (ref 26.0–34.0)
MCHC: 31.3 g/dL (ref 30.0–36.0)
MCV: 69.6 fL — ABNORMAL LOW (ref 80.0–100.0)
Platelets: 496 10*3/uL — ABNORMAL HIGH (ref 150–400)
RBC: 4.37 MIL/uL (ref 3.87–5.11)
RDW: 16.7 % — ABNORMAL HIGH (ref 11.5–15.5)
WBC: 7.2 10*3/uL (ref 4.0–10.5)
nRBC: 0 % (ref 0.0–0.2)

## 2020-04-07 MED ORDER — ENOXAPARIN SODIUM 40 MG/0.4ML ~~LOC~~ SOLN: 40.0000 mg | SUBCUTANEOUS | Status: DC

## 2020-04-07 MED ORDER — IOHEXOL 300 MG/ML  SOLN
75.0000 mL | Freq: Once | INTRAMUSCULAR | Status: AC | PRN
  Administered 2020-04-07: 75 mL via INTRAVENOUS

## 2020-04-07 MED ORDER — IBUPROFEN 200 MG PO TABS
400.0000 mg | ORAL_TABLET | Freq: Four times a day (QID) | ORAL | Status: DC | PRN
  Administered 2020-04-07 – 2020-04-09 (×4): 400 mg via ORAL
  Filled 2020-04-07 (×5): qty 2

## 2020-04-07 NOTE — Consult Note (Addendum)
Chief Complaint: Patient was seen in consultation today for abdominal mass/biopsy.  Referring Physician(s): Arrien, Jimmy Picket  Supervising Physician: Markus Daft  Patient Status: [facility] - In-pt  History of Present Illness: Heather [name] is a 44 y.o. female with a past medical history of HIV, hepatitis B, and anemia. She presented to [facility] ED 04/06/2020 with complaints of abdominal pain. In ED, CT abdomen/pelvis revealed a large abdominal mass. She was admitted for further management.  CT abdomen/pelvis 04/06/2020: 1. 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 There is extension into the splenic hilum with diffuse splenic metastases. 3. Retroperitoneal adenopathy, consistent with metastatic disease 4. Diffuse wall thickening seen within the proximal stomach which could either be related to gastritis or possible metastatic disease. 5. Small amount of abdominopelvic ascites 6. Enlarged uterus with multiple uterine fibroids  IR requested by Dr. Cathlean Sauer for possible image-guided abdominal mass biopsy. Patient awake and alert laying in bed. Complains of intermittent epigastric pain, rated 3/10 at this time. Denies fever, chills, chest pain, dyspnea, or headache.   Past Medical History:  Diagnosis Date  . Anemia   . 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 History of blood transfusion    childhood  . HIV (human immunodeficiency virus infection) ([place])   . Immune deficiency disorder [facility])     Past Surgical History:  Procedure Laterality Date  . CHROMOPERTUBATION Bilateral 11/29/2016   Procedure: CHROMOPERTUBATION;  Surgeon: Waymon Amato, MD;  Location: [place] [facility];  Service: Gynecology;  Laterality: Bilateral;  fallopian tubes  . ENDOMETRIAL BIOPSY    . MYOMECTOMY N/A 11/29/2016   Procedure: MYOMECTOMY;  Surgeon: Waymon Amato, MD;  Location: [place] [facility];   Service: Gynecology;  Laterality: N/A;    Allergies: Patient has no known allergies.  Medications: Prior to Admission medications   Medication Sig Start Date End Date Taking? Authorizing Provider  bictegravir-emtricitabine-tenofovir AF (BIKTARVY) 50-200-25 MG TABS tablet Take 1 tablet by mouth daily.   Yes [provider]  cholecalciferol (VITAMIN D3) 25 MCG (1000 UNIT) tablet Take 1,000 Units by mouth daily.   Yes [provider]  Multiple Vitamin (MULTIVITAMIN WITH MINERALS) TABS tablet Take 1 tablet by mouth daily.   Yes [provider]     Family History  Problem Relation Age of Onset  . Diabetes Mother   . Hypertension Mother     Social History   Socioeconomic History  . Marital status: Married    Spouse name: Not on file  . Number of children: Not on file  . Years of education: Not on file  . Highest education level: Not on file  Occupational History  . Not on file  Tobacco Use  . Smoking status: Never Smoker  . Smokeless tobacco: Never Used  Substance and Sexual Activity  . Alcohol use: Yes    Comment: occ  . Drug use: No  . Sexual activity: Yes  Other Topics Concern  . Not on file  Social History Narrative  . Not on file   Social Determinants of Health   Financial Resource Strain:   . Difficulty of Paying Living Expenses:   Food Insecurity:   . Worried About Charity fundraiser in the Last Year:   . Arboriculturist in the Last Year:   Transportation Needs:   . Film/video editor (Medical):   Marland Kitchen Lack of Transportation (Non-Medical):  Physical Activity:   . Days of Exercise per Week:   . Minutes of Exercise per Session:   Stress:   . Feeling of Stress :   Social Connections:   . Frequency of Communication with Friends and Family:   . Frequency of Social Gatherings with Friends and Family:   . Attends Religious Services:   . Active Member of Clubs or Organizations:   . Attends Archivist Meetings:   Marland Kitchen  Marital Status:      Review of Systems: A 12 point ROS discussed and pertinent positives are indicated in the HPI above.  All other systems are negative.  Review of Systems  Constitutional: Negative for chills and fever.  Respiratory: Negative for shortness of breath and wheezing.   Cardiovascular: Negative for chest pain and palpitations.  Gastrointestinal: Positive for abdominal pain.  Neurological: Negative for headaches.  Psychiatric/Behavioral: Negative for behavioral problems and confusion.    Vital Signs: BP (!) 133/94 (BP Location: Left Arm)   Pulse (!) 108   Temp 98.8 F (37.1 C) (Oral)   Resp 16   Ht 5' (1.524 m)   Wt 133 lb 4.8 oz (60.5 kg)   LMP 04/05/2020 (Exact Date)   SpO2 97%   BMI 26.03 kg/m   Physical Exam Vitals and nursing note reviewed.  Constitutional:      General: She is not in acute distress.    Appearance: Normal appearance.  Cardiovascular:     Rate and Rhythm: Regular rhythm. Tachycardia present.     Heart sounds: Normal heart sounds. No murmur.  Pulmonary:     Effort: Pulmonary effort is normal. No respiratory distress.     Breath sounds: Normal breath sounds. No wheezing.  Skin:    General: Skin is warm and dry.  Neurological:     Mental Status: She is alert and oriented to person, place, and time.      MD Evaluation Airway: WNL Heart: WNL Abdomen: WNL Chest/ Lungs: WNL ASA  Classification: 2 Mallampati/Airway Score: Two   Imaging: CT ABDOMEN PELVIS W CONTRAST  Result Date: 04/06/2020 CLINICAL DATA:  Periodically epigastric pain EXAM: CT ABDOMEN AND PELVIS WITH CONTRAST TECHNIQUE: Multidetector CT imaging of the abdomen and pelvis was performed using the standard protocol following bolus administration of intravenous contrast. CONTRAST:  197mL OMNIPAQUE IOHEXOL 300 MG/ML  SOLN COMPARISON:  None. FINDINGS: Lower chest: The visualized heart size within normal limits. No pericardial fluid/thickening. No hiatal hernia. There is a  small to moderate left pleural effusion with basilar atelectasis. Hepatobiliary: The liver is normal in density without focal abnormality.The main portal vein is patent. No evidence of calcified gallstones, gallbladder wall thickening or biliary dilatation. Pancreas: There is a extensive heterogeneously enhancing mass involving nearly the entirety of the pancreatic body and tail. Only a small portion of the pancreatic head appears to be spared. The SMV and SMA appear to be patent. No areas of internal necrosis seen within the mass. The mass appears to extend into the splenic hilum. Spleen: Extensive large multiple heterogeneously enhancing hypodense masses seen throughout the splenic parenchyma likely from the extension of the pancreatic mass which extends through the splenic hilum. There is diffuse splenomegaly. The splenic vein appears to be narrowed wall entering the splenic hilum. Adrenals/Urinary Tract: Both adrenal glands appear normal. The kidneys and collecting system appear normal without evidence of urinary tract calculus or hydronephrosis. Bladder is unremarkable. Stomach/Bowel: There appears to be diffuse wall thickening seen within the proximal stomach with with the  heterogeneous mass seen encompassing the proximal portion. The remainder of the small-bowel and colon are unremarkable. There is a moderate amount of colonic stool present. Vascular/Lymphatic: There is a left periaortic nodule/lymph node causing mild displacement of the third portion of the duodenum measuring 2.8 x 2.7 cm. Scattered small left-sided periaortic lymph nodes are also noted. Reproductive: Extensively enlarged uterus with multiple hypodense fibroids are seen throughout as on a prior MRI dating back to 2017. Small amount of fluid within the endometrial canal. Other: Small amount of abdominopelvic ascites is seen. Musculoskeletal: No acute or significant osseous findings. 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 Retroperitoneal adenopathy, consistent with metastatic disease Diffuse wall thickening seen within the proximal stomach which could either be related to gastritis or possible metastatic disease. Small amount of abdominopelvic ascites Enlarged uterus with multiple uterine fibroids These results were called by telephone at the time of interpretation on 04/06/2020 at 5:19 am to provider [facility] , who verbally acknowledged these results. Electronically Signed   By: Prudencio Pair M.D.   On: 04/06/2020 05:19    Labs:  CBC: Recent Labs    04/06/20 0350 04/07/20 0537  WBC 7.0 7.2  HGB 9.3* 9.5*  HCT 29.5* 30.4*  PLT 554* 496*    COAGS: No results for input(s): INR, APTT in the last 8760 hours.  BMP: Recent Labs    04/06/20 0350 04/07/20 0537  NA 137 135  K 3.9 4.0  CL 101 100  CO2 25 25  GLUCOSE 126* 120*  BUN 10 <5*  CALCIUM 9.4 9.1  CREATININE 0.74 0.68  GFRNONAA >60 >60  GFRAA >60 >60    LIVER FUNCTION TESTS: Recent Labs    04/06/20 0350 04/07/20 0537  BILITOT 0.4 0.5  AST 21 25  ALT 12 11  ALKPHOS 87 83  PROT 7.2 6.5  ALBUMIN 3.0* 2.9*     Assessment and Plan:  Abdominal mass (presumed pancreatic mass based on recent CT). Plan for image-guided abdominal mass biopsy tentatively for tomorrow in IR. Patient will be NPO at midnight. Afebrile. Will hold Lovenox per IR protocol. INR pending.  Risks and benefits discussed with the patient including, but not limited to bleeding, infection, damage to adjacent structures or low yield requiring additional tests. All of the patient's questions were answered, patient is agreeable to proceed. Consent signed and in chart.   Thank you for this interesting consult.  I greatly enjoyed meeting [facility] and look  forward to participating in their care.  A copy of this report was sent to the requesting provider on this date.  Electronically Signed: Earley Abide, PA-C 04/07/2020, 3:19 PM   I spent a total of 40 Minutes in face to face in clinical consultation, greater than 50% of which was counseling/coordinating care for abdominal mass/biopsy.

## 2020-04-07 NOTE — Progress Notes (Signed)
Pt taken in [facility] to radiology for CT chest

## 2020-04-07 NOTE — Progress Notes (Signed)
PROGRESS NOTE  Heather [name] [id number] DOB: October 11, 1976 DOA: 04/06/2020 PCP: Katherina Mires, MD   LOS: 1 day   Brief Narrative / Interim history: Heather [name] is a 44 y.o. female with medical history significant of hepatitis B, HIV and chronic anemia who presents with abdominal pain. Patient reports 2 weeks of early satiety, postprandial vomiting, poor appetite and 5 pound weight loss. She has been experiencing postprandial abdominal pain mainly at night after dinner and while laying in bed. The pain has been moderate in intensity, has been occurring almost every night in frequency, has been associated with nausea and improves after vomiting. Seems to be worse while supine. No associated fevers or chills.  Due to persistent symptoms she presented to [facility] where she underwent CT of the abdomen and pelvis showing an extensive heterogeneous mass involving nearly entire of the pancreas, concerning for neoplasm or lymphoma, with significant splenic metastasis. Positive retroperitoneal adenopathy. Diffuse thickening of the proximal stomach. Urinalysis negative for infection.  Patient admitted to telemetry unit for further evaluation and possible IR biopsy.   Subjective / 24h Interval events: Patient was laying comfortably in bed on morning rounds. She endorsed mild nausea without vomiting, and no pain. Occasional positional discomfort while supine with "pressure and dull ache" from epigastric to LUQ and radiation to left flank and back. She stated she still had some difficulty swallowing even water as she tends to vomit anything back up. Currently on a clear liquid diet with variable tolerance.   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  - I's & O's. - Dr. Irene Limbo, Oncology  consulted. - IR consulted for possible biopsy of the pancreas. - Considering HIV status, lymphoma is a differential diagnosis.   Active Problems HIV with AIDS:  -  Patient is followed by Dr. Lilyan Gilford [phone]) with [facility], last visit on 5/21. - She is currently on Biktarvy, recent CD4 and VL were 180 (CD4% 17,7) and 24.3 respectively (09/2019). Stable disease course on antiretroviral therapy. - Continue antiretroviral therapy. - Patient requested Dr. Stark Jock to be updated. Will call today.   Iron deficiency anemia:  - HGB 9.5 on 6/3. Patient asymptomatic without complaints. Clinically stable without bleeding.  - Monitor H&H.  Possible gastric outlet obstruction: - Difficulty swallowing, vomiting with PO intake and moderate to severe epigastric pain. Likely secondary to mass effect from significant abdominal pathology. Oncology consulted.   Scheduled Meds: . bictegravir-emtricitabine-tenofovir AF  1 tablet Oral Daily  . docusate sodium  100 mg Oral BID  . enoxaparin (LOVENOX) injection  40 mg Subcutaneous Q24H  . multivitamin with minerals  1 tablet Oral Daily  . pantoprazole (PROTONIX) IV  40 mg Intravenous Q12H   Continuous Infusions: . dextrose 5% lactated ringers 75 mL/hr at 04/07/20 0928   PRN Meds:.acetaminophen **OR** acetaminophen, morphine injection, ondansetron **OR** ondansetron (ZOFRAN) IV, oxyCODONE-acetaminophen  DVT prophylaxis: Enoxaparin Code Status: Full code Family Communication: No family at bedside on rounds.  Status is: Inpatient  Remains inpatient appropriate because:Ongoing diagnostic testing needed not appropriate for outpatient work up and Inpatient level of care appropriate due to severity of illness   Dispo: The patient is from: Home              Anticipated d/c is to: Home  Anticipated d/c date is: 1 day              Patient currently is not medically stable to d/c.   Consultants:  Oncology, IR, much  appreciated.  Procedures:  2D echo: None Foley: None BiPAP: None HD: None  Microbiology  Covid negative  Antimicrobials: None    Objective: Vitals:   04/06/20 2019 04/07/20 0016 04/07/20 0415 04/07/20 0740  BP: 123/85 139/86 134/85 118/78  Pulse: (!) 102 (!) 106 (!) 101 (!) 103  Resp: 16 16 16    Temp: 98.5 F (36.9 C) 98.4 F (36.9 C) 98.5 F (36.9 C) 98.8 F (37.1 C)  TempSrc:    Oral  SpO2: 98% 95% 98% 100%  Weight:      Height:        Intake/Output Summary (Last 24 hours) at 04/07/2020 1108 Last data filed at 04/07/2020 0302 Gross per 24 hour  Intake 1110.04 ml  Output --  Net 1110.04 ml   Filed Weights   04/06/20 0320  Weight: 60.5 kg    Examination:  Constitutional: NAD Eyes: no scleral icterus ENMT: Mucous membranes are moist.  Neck: normal, supple Respiratory: decreased breath sounds to lower left lung fields, otherwise clear to auscultation bilaterally, no wheezing, no crackles. Normal respiratory effort. No accessory muscle use.  Cardiovascular: Regular rate and rhythm, no murmurs / rubs / gallops. No LE edema. Good peripheral pulses Abdomen: mild distension with mild tenderness to palpation in LUQ without guarding. Bowel sounds positive.  Musculoskeletal: no clubbing / cyanosis.  Skin: no rashes Neurologic: Grossly non-focal. Psychiatric: Normal judgment and insight. Alert and oriented x 3. Normal mood.    Data Reviewed: I have independently reviewed following labs and imaging studies   CBC: Recent Labs  Lab 04/06/20 0350 04/07/20 0537  WBC 7.0 7.2  NEUTROABS 5.1  --   HGB 9.3* 9.5*  HCT 29.5* 30.4*  MCV 67.4* 69.6*  PLT 554* Q000111Q*   Basic Metabolic Panel: Recent Labs  Lab 04/06/20 0350 04/07/20 0537  NA 137 135  K 3.9 4.0  CL 101 100  CO2 25 25  GLUCOSE 126* 120*  BUN 10 <5*  CREATININE 0.74 0.68  CALCIUM 9.4 9.1   Liver Function Tests: Recent Labs  Lab 04/06/20 0350 04/07/20 0537  AST 21 25  ALT 12 11  ALKPHOS 87 83   BILITOT 0.4 0.5  PROT 7.2 6.5  ALBUMIN 3.0* 2.9*   Coagulation Profile: No results for input(s): INR, PROTIME in the last 168 hours. HbA1C: No results for input(s): HGBA1C in the last 72 hours. CBG: No results for input(s): GLUCAP in the last 168 hours.  Recent Results (from the past 240 hour(s))  SARS Coronavirus 2 by RT PCR (hospital order, performed in [facility] hospital lab) Nasopharyngeal Nasopharyngeal Swab     Status: None   Collection Time: 04/06/20  5:55 AM   Specimen: Nasopharyngeal Swab  Result Value Ref Range Status   SARS Coronavirus 2 NEGATIVE NEGATIVE Final    Comment: (NOTE) SARS-CoV-2 target nucleic acids are NOT DETECTED. The SARS-CoV-2 RNA is generally detectable in upper and lower respiratory specimens during the acute phase of infection. The lowest concentration of SARS-CoV-2 viral copies this assay can detect is 250 copies / mL. A negative result does not preclude SARS-CoV-2 infection and should not be used as the sole basis for treatment or other patient management decisions.  A negative result may occur with improper specimen collection / handling, submission of specimen other than nasopharyngeal  swab, presence of viral mutation(s) within the areas targeted by this assay, and inadequate number of viral copies (<250 copies / mL). A negative result must be combined with clinical observations, patient history, and epidemiological information. Fact Sheet for Patients:   StrictlyIdeas.no Fact Sheet for Healthcare Providers: BankingDealers.co.za This test is not yet approved or cleared  by the [place] FDA and has been authorized for detection and/or diagnosis of SARS-CoV-2 by FDA under an Emergency Use Authorization (EUA).  This EUA will remain in effect (meaning this test can be used) for the duration of the COVID-19 declaration under Section 564(b)(1) of the Act, 21 U.S.C. section 360bbb-3(b)(1), unless  the authorization is terminated or revoked sooner. Performed at [facility], [address], [place], [place] [address]      Radiology Studies: No results found.

## 2020-04-07 NOTE — Progress Notes (Signed)
PROGRESS NOTE    Heather [name]  [id number] DOB: 1976-08-08 DOA: 04/06/2020 PCP: Katherina Mires, MD    Brief Narrative:  Patient admitted to the hospital with a working diagnosis of newly diagnosed large pancreatic mass with splenic metastasis and retroperitoneal adenopathy.   44 year old female with significant past medical history for HIV/AIDS and chronic hepatitis B who presents with abdominal pain, urinary satiety and postprandial vomiting.  Initial physical examination she is afebrile, blood pressure 132/86, heart rate 112, respiratory rate 18, oxygen saturation 99% on room air.  She has mild pallor, her lungs are clear to auscultation bilaterally, heart S1-S2, present rhythmic, soft abdomen, mildly distended, no lower extremity edema. Sodium 137, potassium 3.9, chloride 101, bicarb 25, glucose 126, BUN 10, creatinine 0.74, white count 7.0, hemoglobin 9.3, hematocrit 29.5, platelets 554.  SARS COVID-19 negative.  Urine analysis negative for infection. CT of the abdomen pelvis with extensively a large heterogeneous mass involving nearly the entire pancreas.  Retroperitoneal adenopathy.  Splenic metastasis.  Diffuse wall thickening proximal stomach.   Assessment & Plan:   Principal Problem:   Pancreatic mass Active Problems:   Severe anemia   Fibroid uterus   Hepatitis B   HIV (human immunodeficiency virus infection) ([place])   1.  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 Patient continue to have abdominal pain and occasional nausea. Pending IR evaluation for possible biopsy, considering her history of HIV, lymphoma is possible diagnosis.  Continue acetaminophen and ondansetron for symptomatic control, will add ibuprofen and will dc hydr oxycodone.   2.  HIV AIDS.  Follows with [facility], last visit 03/2420.  Her disease has been stable on antiretroviral therapy.  She is currently on Biktarvy, recent CD4 and VL  were 180 (CD4% 17,7) and 24.3 respectively (09/2019)  Plan for outpatient follow up.   3.  Iron deficiency anemia.  Clinically stable. Her Hgb today is 9,5 and Hct at 30,4.   Status is: Inpatient  Remains inpatient appropriate because:Ongoing diagnostic testing needed not appropriate for outpatient work up   Dispo: The patient is from: Home              Anticipated d/c is to: Home              Anticipated d/c date is: 2 days              Patient currently is not medically stable to d/c.   DVT prophylaxis: Enoxaparin   Code Status:   full  Family Communication:  I spoke with patient's husband at the bedside, we talked in detail about patient's condition, plan of care and prognosis and all questions were addressed.     Consultants:   Oncology   IR     Subjective: Patient continue to have intermittent abdominal pain and occasional nausea, no dyspnea or chest pain.   Objective: Vitals:   04/07/20 0415 04/07/20 0740 04/07/20 1300 04/07/20 1324  BP: 134/85 118/78  (!) 133/94  Pulse: (!) 101 (!) 103  (!) 108  Resp: 16   16  Temp: 98.5 F (36.9 C) 98.8 F (37.1 C) 98.8 F (37.1 C) 98.8 F (37.1 C)  TempSrc:  Oral  Oral  SpO2: 98% 100%  97%  Weight:      Height:        Intake/Output Summary (Last 24 hours) at 04/07/2020 1412 Last data filed at 04/07/2020 0302 Gross per 24 hour  Intake 1110.04 ml  Output --  Net 1110.04 ml   Filed Weights   04/06/20 0320  Weight: 60.5 kg    Examination:   General: Not in pain or dyspnea.  Neurology: Awake and alert, non focal  E ENT: no pallor, no icterus, oral mucosa moist Cardiovascular: No JVD. S1-S2 present, rhythmic, no gallops, rubs, or murmurs. No lower extremity edema. Pulmonary: positive breath sounds bilaterally, adequate air movement, no wheezing, rhonchi or rales. Gastrointestinal. Abdomen soft with no organomegaly, non tender, no rebound or guarding Skin. No rashes Musculoskeletal: no joint  deformities     Data Reviewed: I have personally reviewed following labs and imaging studies  CBC: Recent Labs  Lab 04/06/20 0350 04/07/20 0537  WBC 7.0 7.2  NEUTROABS 5.1  --   HGB 9.3* 9.5*  HCT 29.5* 30.4*  MCV 67.4* 69.6*  PLT 554* Q000111Q*   Basic Metabolic Panel: Recent Labs  Lab 04/06/20 0350 04/07/20 0537  NA 137 135  K 3.9 4.0  CL 101 100  CO2 25 25  GLUCOSE 126* 120*  BUN 10 <5*  CREATININE 0.74 0.68  CALCIUM 9.4 9.1   GFR: Estimated Creatinine Clearance: 73.7 mL/min (by C-G formula based on SCr of 0.68 mg/dL). Liver Function Tests: Recent Labs  Lab 04/06/20 0350 04/07/20 0537  AST 21 25  ALT 12 11  ALKPHOS 87 83  BILITOT 0.4 0.5  PROT 7.2 6.5  ALBUMIN 3.0* 2.9*   Recent Labs  Lab 04/06/20 0350  LIPASE 123*   No results for input(s): AMMONIA in the last 168 hours. Coagulation Profile: No results for input(s): INR, PROTIME in the last 168 hours. Cardiac Enzymes: No results for input(s): CKTOTAL, CKMB, CKMBINDEX, TROPONINI in the last 168 hours. BNP (last 3 results) No results for input(s): PROBNP in the last 8760 hours. HbA1C: No results for input(s): HGBA1C in the last 72 hours. CBG: No results for input(s): GLUCAP in the last 168 hours. Lipid Profile: No results for input(s): CHOL, HDL, LDLCALC, TRIG, CHOLHDL, LDLDIRECT in the last 72 hours. Thyroid Function Tests: No results for input(s): TSH, T4TOTAL, FREET4, T3FREE, THYROIDAB in the last 72 hours. Anemia Panel: No results for input(s): VITAMINB12, FOLATE, FERRITIN, TIBC, IRON, RETICCTPCT in the last 72 hours.    Radiology Studies: I have reviewed all of the imaging during this hospital visit personally     Scheduled Meds: . bictegravir-emtricitabine-tenofovir AF  1 tablet Oral Daily  . docusate sodium  100 mg Oral BID  . [START ON 04/09/2020] enoxaparin (LOVENOX) injection  40 mg Subcutaneous Q24H  . multivitamin with minerals  1 tablet Oral Daily  . pantoprazole (PROTONIX) IV   40 mg Intravenous Q12H   Continuous Infusions: . dextrose 5% lactated ringers 75 mL/hr at 04/07/20 0928     LOS: 1 day        Breniya Goertzen Gerome Apley, MD

## 2020-04-07 NOTE — ED Provider Notes (Signed)
[facility] Provider Note   CSN: [id number] Arrival date & time: 04/06/20  0305     History Chief Complaint  Patient presents with  . Abdominal Pain    Heather [name] is a 44 y.o. female.   Abdominal Pain Pain location:  Generalized Pain quality: aching, bloating and dull   Pain radiates to:  Epigastric region Pain severity:  Mild Onset quality:  Gradual Duration:  3 weeks Timing:  Constant Progression:  Worsening Chronicity:  New Relieved by:  None tried Worsened by:  Nothing Ineffective treatments:  None tried Associated symptoms: anorexia, belching, nausea and vomiting   Associated symptoms: no fever        Past Medical History:  Diagnosis Date  . Anemia   . 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 History of blood transfusion    childhood  . HIV (human immunodeficiency virus infection) ([place])   . Immune deficiency disorder [facility])     Patient Active Problem List   Diagnosis Date Noted  . Pancreatic mass 04/06/2020  . Hepatitis B 04/06/2020  . HIV (human immunodeficiency virus infection) ([place]) 04/06/2020  . Fibroids 11/29/2016  . Severe anemia 11/23/2016  . Fibroid uterus 11/23/2016  . Menorrhagia 06/12/2016    Past Surgical History:  Procedure Laterality Date  . CHROMOPERTUBATION Bilateral 11/29/2016   Procedure: CHROMOPERTUBATION;  Surgeon: Waymon Amato, MD;  Location: [facility];  Service: Gynecology;  Laterality: Bilateral;  fallopian tubes  . ENDOMETRIAL BIOPSY    . MYOMECTOMY N/A 11/29/2016   Procedure: MYOMECTOMY;  Surgeon: Waymon Amato, MD;  Location: [facility];  Service: Gynecology;  Laterality: N/A;     OB History    Gravida  0   Para  0   Term  0   Preterm  0   AB  0   Living  0     SAB  0   TAB  0   Ectopic  0   Multiple  0   Live Births  0           Family History  Problem Relation Age of Onset  . Diabetes Mother   . Hypertension Mother     Social History   Tobacco Use  .  Smoking status: Never Smoker  . Smokeless tobacco: Never Used  Substance Use Topics  . Alcohol use: Yes    Comment: occ  . Drug use: No    Home Medications Prior to Admission medications   Medication Sig Start Date End Date Taking? Authorizing Provider  bictegravir-emtricitabine-tenofovir AF (BIKTARVY) 50-200-25 MG TABS tablet Take 1 tablet by mouth daily.   Yes [provider]  cholecalciferol (VITAMIN D3) 25 MCG (1000 UNIT) tablet Take 1,000 Units by mouth daily.   Yes [provider]  Multiple Vitamin (MULTIVITAMIN WITH MINERALS) TABS tablet Take 1 tablet by mouth daily.   Yes [provider]    Allergies    Patient has no known allergies.  Review of Systems   Review of Systems  Constitutional: Negative for fever.  Gastrointestinal: Positive for abdominal pain, anorexia, nausea and vomiting.  All other systems reviewed and are negative.   Physical Exam Updated Vital Signs BP 118/78 (BP Location: Left Arm)   Pulse (!) 103   Temp 98.8 F (37.1 C) (Oral)   Resp 16   Ht 5' (1.524 m)   Wt 60.5 kg   LMP 04/05/2020 (Exact Date)   SpO2 100%   BMI 26.03  kg/m   Physical Exam Vitals and nursing note reviewed.  Constitutional:      Appearance: She is well-developed.  HENT:     Head: Normocephalic and atraumatic.     Nose: No congestion or rhinorrhea.  Eyes:     Pupils: Pupils are equal, round, and reactive to light.  Cardiovascular:     Rate and Rhythm: Normal rate and regular rhythm.  Pulmonary:     Effort: No respiratory distress.     Breath sounds: No stridor.  Abdominal:     General: Bowel sounds are normal. There is distension.     Palpations: Abdomen is soft.     Hernia: No hernia is present.  Musculoskeletal:        General: No swelling or tenderness. Normal range of motion.     Cervical back: Normal range of motion.  Skin:    General: Skin is warm and dry.  Neurological:     General: No focal deficit present.     Mental  Status: She is alert.     ED Results / Procedures / Treatments   Labs (all labs ordered are listed, but only abnormal results are displayed) Labs Reviewed  CBC WITH DIFFERENTIAL/PLATELET - Abnormal; Notable for the following components:      Result Value   Hemoglobin 9.3 (*)    HCT 29.5 (*)    MCV 67.4 (*)    MCH 21.2 (*)    RDW 17.1 (*)    Platelets 554 (*)    Lymphs Abs 0.6 (*)    Monocytes Absolute 1.1 (*)    All other components within normal limits  COMPREHENSIVE METABOLIC PANEL - Abnormal; Notable for the following components:   Glucose, Bld 126 (*)    Albumin 3.0 (*)    All other components within normal limits  LIPASE, BLOOD - Abnormal; Notable for the following components:   Lipase 123 (*)    All other components within normal limits  URINALYSIS, ROUTINE W REFLEX MICROSCOPIC - Abnormal; Notable for the following components:   Hgb urine dipstick MODERATE (*)    All other components within normal limits  URINALYSIS, MICROSCOPIC (REFLEX) - Abnormal; Notable for the following components:   Bacteria, UA RARE (*)    All other components within normal limits  COMPREHENSIVE METABOLIC PANEL - Abnormal; Notable for the following components:   Glucose, Bld 120 (*)    BUN <5 (*)    Albumin 2.9 (*)    All other components within normal limits  CBC - Abnormal; Notable for the following components:   Hemoglobin 9.5 (*)    HCT 30.4 (*)    MCV 69.6 (*)    MCH 21.7 (*)    RDW 16.7 (*)    Platelets 496 (*)    All other components within normal limits  SARS CORONAVIRUS 2 BY RT PCR (HOSPITAL ORDER, [facility])  PREGNANCY, URINE    EKG None  Radiology CT ABDOMEN PELVIS W CONTRAST  Result Date: 04/06/2020 CLINICAL DATA:  Periodically epigastric pain EXAM: CT ABDOMEN AND PELVIS WITH CONTRAST TECHNIQUE: Multidetector CT imaging of the abdomen and pelvis was performed using the standard protocol following bolus administration of intravenous contrast.  CONTRAST:  129mL OMNIPAQUE IOHEXOL 300 MG/ML  SOLN COMPARISON:  None. FINDINGS: Lower chest: The visualized heart size within normal limits. No pericardial fluid/thickening. No hiatal hernia. There is a small to moderate left pleural effusion with basilar atelectasis. Hepatobiliary: The liver is normal in density without focal  abnormality.The main portal vein is patent. No evidence of calcified gallstones, gallbladder wall thickening or biliary dilatation. Pancreas: There is a extensive heterogeneously enhancing mass involving nearly the entirety of the pancreatic body and tail. Only a small portion of the pancreatic head appears to be spared. The SMV and SMA appear to be patent. No areas of internal necrosis seen within the mass. The mass appears to extend into the splenic hilum. Spleen: Extensive large multiple heterogeneously enhancing hypodense masses seen throughout the splenic parenchyma likely from the extension of the pancreatic mass which extends through the splenic hilum. There is diffuse splenomegaly. The splenic vein appears to be narrowed wall entering the splenic hilum. Adrenals/Urinary Tract: Both adrenal glands appear normal. The kidneys and collecting system appear normal without evidence of urinary tract calculus or hydronephrosis. Bladder is unremarkable. Stomach/Bowel: There appears to be diffuse wall thickening seen within the proximal stomach with with the heterogeneous mass seen encompassing the proximal portion. The remainder of the small-bowel and colon are unremarkable. There is a moderate amount of colonic stool present. Vascular/Lymphatic: There is a left periaortic nodule/lymph node causing mild displacement of the third portion of the duodenum measuring 2.8 x 2.7 cm. Scattered small left-sided periaortic lymph nodes are also noted. Reproductive: Extensively enlarged uterus with multiple hypodense fibroids are seen throughout as on a prior MRI dating back to 2017. Small amount of fluid  within the endometrial canal. Other: Small amount of abdominopelvic ascites is seen. Musculoskeletal: No acute or significant osseous findings. 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 Retroperitoneal adenopathy, consistent with metastatic disease Diffuse wall thickening seen within the proximal stomach which could either be related to gastritis or possible metastatic disease. Small amount of abdominopelvic ascites Enlarged uterus with multiple uterine fibroids These results were called by telephone at the time of interpretation on 04/06/2020 at 5:19 am to provider [facility] , who verbally acknowledged these results. Electronically Signed   By: Prudencio Pair M.D.   On: 04/06/2020 05:19    Procedures Procedures (including critical care time)  Medications Ordered in ED Medications  oxyCODONE-acetaminophen (PERCOCET/ROXICET) 5-325 MG per tablet 1-2 tablet (has no administration in time range)  docusate sodium (COLACE) capsule 100 mg (100 mg Oral Given 04/06/20 2014)  multivitamin with minerals tablet 1 tablet (1 tablet Oral Given 04/06/20 2014)  enoxaparin (LOVENOX) injection 40 mg (40 mg Subcutaneous Given 04/06/20 2015)  acetaminophen (TYLENOL) tablet 650 mg (650 mg Oral Given 04/07/20 0746)    Or  acetaminophen (TYLENOL) suppository 650 mg ( Rectal See Alternative 04/07/20 0746)  ondansetron (ZOFRAN) tablet 4 mg (has no administration in time range)    Or  ondansetron (ZOFRAN) injection 4 mg (has no administration in time range)  bictegravir-emtricitabine-tenofovir AF (BIKTARVY) 50-200-25 MG per tablet 1 tablet (1 tablet Oral Given 04/07/20 0746)  pantoprazole (PROTONIX) injection 40 mg (40 mg Intravenous Given 04/06/20 2011)  dextrose 5 % in lactated ringers infusion ( Intravenous New  Bag/Given 04/06/20 2009)  morphine 2 MG/ML injection 1 mg (has no administration in time range)  alum & mag hydroxide-simeth (MAALOX/MYLANTA) 200-200-20 MG/5ML suspension 30 mL (30 mLs Oral Given 04/06/20 0400)  pantoprazole (PROTONIX) EC tablet 40 mg (40 mg Oral Given 04/06/20 0359)  famotidine (PEPCID) tablet 20 mg (20 mg Oral Given 04/06/20 0359)  lidocaine (XYLOCAINE) 2 % viscous mouth solution 15  mL (15 mLs Mouth/Throat Given 04/06/20 0400)  ondansetron (ZOFRAN) injection 4 mg (4 mg Intravenous Given 04/06/20 0357)  lactated ringers bolus 1,000 mL (0 mLs Intravenous Stopped 04/06/20 0532)  iohexol (OMNIPAQUE) 300 MG/ML solution 100 mL (100 mLs Intravenous Contrast Given 04/06/20 0450)  traMADol (ULTRAM) tablet 50 mg (50 mg Oral Given 04/06/20 1041)    ED Course  I have reviewed the triage vital signs and the nursing notes.  Pertinent labs & imaging results that were available during my care of the patient were reviewed by me and considered in my medical decision making (see chart for details).    MDM Rules/Calculators/A&P  Patient with significant abdominal pathology that likely caused a partial gastric outlet obstruction since she is having difficulty swallowing things and throwing up after eating with severe epigastric pain as well. D/w dr. Irene Limbo with oncology who will see and help manage at [facility]. Discussed with dr. Sidney Ace for admission for same.   Final Clinical Impression(s) / ED Diagnoses Final diagnoses:  Pancreatic mass  Partial gastric outlet obstruction    Rx / DC Orders ED Discharge Orders    None       Javaris Wigington, Corene Cornea, MD 04/07/20 [phone]

## 2020-04-07 NOTE — Progress Notes (Signed)
Patient called out to nursing station complaining of nausea. Patient heart rate on telemetry monitor up to the 160s. Patient informed charge nurse that she was actively vomiting, only very small amount of green bile colored emesis noticed in bathroom commode. Patient had more retching and nausea than actual emesis. PRN zofran administered. Will reassess patient in approximately 30 minutes.

## 2020-04-07 NOTE — Consult Note (Addendum)
[place]  [phone] Fax:([phone]   MEDICAL ONCOLOGY - INITIAL CONSULTATION  Referral MD: Dr. Sander Radon  Reason for Referral: Pancreatic mass with splenic metastases, retroperitoneal adenopathy concerning for metastatic disease, and wall thickening of the proximal stomach concerning for possible metastatic disease.  HPI: Ms. Hodge is a 44 year old female with a past medical history significant for hepatitis B, HIV, chronic anemia who presented to [facility] with abdominal pain.  She had a 2-week history of early satiety, poor appetite and, 5 pound weight loss.  Abdominal pain is typically postprandial and mainly at night after dinner while laying in bed.  Has been associated with nausea improves after vomiting.  Labs on admission showed a hemoglobin of 9.3, MCV 67.4, platelet count 554,000.  Lipase was mildly elevated at 123.  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  Husband was at the bedside at the time of the visit.  The patient reports that she is having ongoing abdominal discomfort today.  Pain is primarily in her epigastric area and radiates to the left side of her abdomen.  States pain is worse when laying down after she eats.  She states that she has had this pain for at least 2 weeks.  Her abdominal pain is worse after eating.  She has had some nausea and intermittent vomiting.  Vomiting makes her pain better.  She was attributing her symptoms to some the medications that she was taking for fertility treatments.   She reports a poor appetite and about a 5 pound weight loss.  She is not having any fevers or chills.  Denies night sweats.  She has not noticed any palpable lymphadenopathy.  She denies headaches, dizziness, chest pain, shortness of breath, diarrhea.  She reports intermittent constipation.  Denies bleeding or lower extremity edema.  The patient is married and has no children.  She currently works as an Therapist, sports.  Denies alcohol tobacco use.  Family history significant for paternal grandmother with uterine cancer.  Medical oncology was asked see the patient to make recommendations regarding her abnormal CT scan findings.   Past Medical History:  Diagnosis Date  . Anemia   . 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 History of blood transfusion    childhood  . HIV (human immunodeficiency virus infection) ([place])   . Immune deficiency disorder [facility])   :  Past Surgical History:  Procedure Laterality Date  . CHROMOPERTUBATION Bilateral 11/29/2016   Procedure: CHROMOPERTUBATION;  Surgeon: Waymon Amato, MD;  Location: [facility];  Service: Gynecology;  Laterality: Bilateral;  fallopian tubes  . ENDOMETRIAL BIOPSY    . MYOMECTOMY N/A 11/29/2016   Procedure: MYOMECTOMY;  Surgeon: Waymon Amato, MD;  Location: [facility];  Service: Gynecology;  Laterality: N/A;  :  Current Facility-Administered Medications  Medication Dose Route Frequency Provider Last Rate Last Admin  . acetaminophen (TYLENOL) tablet 650 mg  650 mg Oral Q6H PRN Arrien, Jimmy Picket, MD   650 mg at 04/07/20 0746   Or  . acetaminophen (TYLENOL) suppository 650 mg  650 mg Rectal Q6H PRN Arrien, Jimmy Picket, MD      .  bictegravir-emtricitabine-tenofovir AF (BIKTARVY) 50-200-25 MG per tablet 1 tablet  1 tablet Oral Daily Arrien, Jimmy Picket, MD   1 tablet at 04/07/20 0746  . dextrose 5 % in lactated ringers infusion   Intravenous Continuous Tawni Millers, MD 75 mL/hr at 04/07/20 0928 New Bag at 04/07/20 [id number]  . docusate sodium  (COLACE) capsule 100 mg  100 mg Oral BID Tawni Millers, MD   100 mg at 04/07/20 0925  . enoxaparin (LOVENOX) injection 40 mg  40 mg Subcutaneous Q24H Tawni Millers, MD   40 mg at 04/06/20 2015  . morphine 2 MG/ML injection 1 mg  1 mg Intravenous Q2H PRN Arrien, Jimmy Picket, MD      . multivitamin with minerals tablet 1 tablet  1 tablet Oral Daily Arrien, Jimmy Picket, MD   1 tablet at 04/06/20 2014  . ondansetron (ZOFRAN) tablet 4 mg  4 mg Oral Q6H PRN Arrien, Jimmy Picket, MD       Or  . ondansetron [facility]) injection 4 mg  4 mg Intravenous Q6H PRN Arrien, Jimmy Picket, MD   4 mg at 04/07/20 1011  . oxyCODONE-acetaminophen (PERCOCET/ROXICET) 5-325 MG per tablet 1-2 tablet  1-2 tablet Oral Q4H PRN Arrien, Jimmy Picket, MD      . pantoprazole (PROTONIX) injection 40 mg  40 mg Intravenous Q12H Arrien, Jimmy Picket, MD   40 mg at 04/07/20 0925     No Known Allergies:  Family History  Problem Relation Age of Onset  . Diabetes Mother   . Hypertension Mother   :  Social History   Socioeconomic History  . Marital status: Married    Spouse name: Not on file  . Number of children: Not on file  . Years of education: Not on file  . Highest education level: Not on file  Occupational History  . Not on file  Tobacco Use  . Smoking status: Never Smoker  . Smokeless tobacco: Never Used  Substance and Sexual Activity  . Alcohol use: Yes    Comment: occ  . Drug use: No  . Sexual activity: Yes  Other Topics Concern  . Not on file  Social History Narrative  . Not on file   Social Determinants of Health   Financial Resource Strain:   . Difficulty of Paying Living Expenses:   Food Insecurity:   . Worried About Charity fundraiser in the Last Year:   . Arboriculturist in the Last Year:   Transportation Needs:   . Film/video editor (Medical):   Marland Kitchen Lack of Transportation (Non-Medical):   Physical Activity:   . Days of Exercise per Week:   .  Minutes of Exercise per Session:   Stress:   . Feeling of Stress :   Social Connections:   . Frequency of Communication with Friends and Family:   . Frequency of Social Gatherings with Friends and Family:   . Attends Religious Services:   . Active Member of Clubs or Organizations:   . Attends Archivist Meetings:   Marland Kitchen Marital Status:   Intimate Partner Violence:   . Fear of Current or Ex-Partner:   . Emotionally Abused:   Marland Kitchen Physically Abused:   . Sexually Abused:   :  Review of Systems: A comprehensive 14 point review of systems was negative except as noted in the HPI.  Exam: Patient Vitals for the past 24 hrs:  BP Temp Temp src Pulse Resp SpO2  04/07/20 0740 118/78 98.8  F (37.1 C) Oral (!) 103 -- 100 %  04/07/20 0415 134/85 98.5 F (36.9 C) -- (!) 101 16 98 %  04/07/20 0016 139/86 98.4 F (36.9 C) -- (!) 106 16 95 %  04/06/20 2019 123/85 98.5 F (36.9 C) -- (!) 102 16 98 %  04/06/20 1610 132/86 98.6 F (37 C) Oral (!) 112 18 99 %  04/06/20 1521 -- 98.7 F (37.1 C) Oral -- -- --  04/06/20 1312 (!) 135/91 -- -- (!) 108 19 98 %    General:  well-nourished in no acute distress.   Eyes:  no scleral icterus.   ENT:  There were no oropharyngeal lesions.   Neck was without thyromegaly.   Lymphatics:  Negative cervical, supraclavicular, axillary, or inguinal adenopathy.   Respiratory: lungs were clear bilaterally without wheezing or crackles.   Cardiovascular:  Regular rate and rhythm, S1/S2, without murmur, rub or gallop.  There was no pedal edema.   GI: Positive bowel sounds, soft, tenderness with palpation in the upper abdomen and left upper quadrant Musculoskeletal:  no spinal tenderness of palpation of vertebral spine.   Skin exam was without echymosis, petichae.   Neuro exam was nonfocal. Patient was alert and oriented.  Attention was good.   Language was appropriate.  Mood was normal without depression.  Speech was not pressured.  Thought content was not  tangential.     Lab Results  Component Value Date   WBC 7.2 04/07/2020   HGB 9.5 (L) 04/07/2020   HCT 30.4 (L) 04/07/2020   PLT 496 (H) 04/07/2020   GLUCOSE 120 (H) 04/07/2020   ALT 11 04/07/2020   AST 25 04/07/2020   NA 135 04/07/2020   K 4.0 04/07/2020   CL 100 04/07/2020   CREATININE 0.68 04/07/2020   BUN <5 (L) 04/07/2020   CO2 25 04/07/2020    CT ABDOMEN PELVIS W CONTRAST  Result Date: 04/06/2020 CLINICAL DATA:  Periodically epigastric pain EXAM: CT ABDOMEN AND PELVIS WITH CONTRAST TECHNIQUE: Multidetector CT imaging of the abdomen and pelvis was performed using the standard protocol following bolus administration of intravenous contrast. CONTRAST:  190mL OMNIPAQUE IOHEXOL 300 MG/ML  SOLN COMPARISON:  None. FINDINGS: Lower chest: The visualized heart size within normal limits. No pericardial fluid/thickening. No hiatal hernia. There is a small to moderate left pleural effusion with basilar atelectasis. Hepatobiliary: The liver is normal in density without focal abnormality.The main portal vein is patent. No evidence of calcified gallstones, gallbladder wall thickening or biliary dilatation. Pancreas: There is a extensive heterogeneously enhancing mass involving nearly the entirety of the pancreatic body and tail. Only a small portion of the pancreatic head appears to be spared. The SMV and SMA appear to be patent. No areas of internal necrosis seen within the mass. The mass appears to extend into the splenic hilum. Spleen: Extensive large multiple heterogeneously enhancing hypodense masses seen throughout the splenic parenchyma likely from the extension of the pancreatic mass which extends through the splenic hilum. There is diffuse splenomegaly. The splenic vein appears to be narrowed wall entering the splenic hilum. Adrenals/Urinary Tract: Both adrenal glands appear normal. The kidneys and collecting system appear normal without evidence of urinary tract calculus or hydronephrosis.  Bladder is unremarkable. Stomach/Bowel: There appears to be diffuse wall thickening seen within the proximal stomach with with the heterogeneous mass seen encompassing the proximal portion. The remainder of the small-bowel and colon are unremarkable. There is a moderate amount of colonic stool present. Vascular/Lymphatic: There is a left  periaortic nodule/lymph node causing mild displacement of the third portion of the duodenum measuring 2.8 x 2.7 cm. Scattered small left-sided periaortic lymph nodes are also noted. Reproductive: Extensively enlarged uterus with multiple hypodense fibroids are seen throughout as on a prior MRI dating back to 2017. Small amount of fluid within the endometrial canal. Other: Small amount of abdominopelvic ascites is seen. Musculoskeletal: No acute or significant osseous findings. 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 Retroperitoneal adenopathy, consistent with metastatic disease Diffuse wall thickening seen within the proximal stomach which could either be related to gastritis or possible metastatic disease. Small amount of abdominopelvic ascites Enlarged uterus with multiple uterine fibroids These results were called by telephone at the time of interpretation on 04/06/2020 at 5:19 am to provider [facility] , who verbally acknowledged these results. Electronically Signed   By: Prudencio Pair M.D.   On: 04/06/2020 05:19     CT ABDOMEN PELVIS W CONTRAST  Result Date: 04/06/2020 CLINICAL DATA:  Periodically epigastric pain EXAM: CT ABDOMEN AND PELVIS WITH CONTRAST TECHNIQUE: Multidetector CT imaging of the abdomen and pelvis was performed using the standard protocol following bolus administration of intravenous contrast. CONTRAST:  141mL OMNIPAQUE IOHEXOL 300  MG/ML  SOLN COMPARISON:  None. FINDINGS: Lower chest: The visualized heart size within normal limits. No pericardial fluid/thickening. No hiatal hernia. There is a small to moderate left pleural effusion with basilar atelectasis. Hepatobiliary: The liver is normal in density without focal abnormality.The main portal vein is patent. No evidence of calcified gallstones, gallbladder wall thickening or biliary dilatation. Pancreas: There is a extensive heterogeneously enhancing mass involving nearly the entirety of the pancreatic body and tail. Only a small portion of the pancreatic head appears to be spared. The SMV and SMA appear to be patent. No areas of internal necrosis seen within the mass. The mass appears to extend into the splenic hilum. Spleen: Extensive large multiple heterogeneously enhancing hypodense masses seen throughout the splenic parenchyma likely from the extension of the pancreatic mass which extends through the splenic hilum. There is diffuse splenomegaly. The splenic vein appears to be narrowed wall entering the splenic hilum. Adrenals/Urinary Tract: Both adrenal glands appear normal. The kidneys and collecting system appear normal without evidence of urinary tract calculus or hydronephrosis. Bladder is unremarkable. Stomach/Bowel: There appears to be diffuse wall thickening seen within the proximal stomach with with the heterogeneous mass seen encompassing the proximal portion. The remainder of the small-bowel and colon are unremarkable. There is a moderate amount of colonic stool present. Vascular/Lymphatic: There is a left periaortic nodule/lymph node causing mild displacement of the third portion of the duodenum measuring 2.8 x 2.7 cm. Scattered small left-sided periaortic lymph nodes are also noted. Reproductive: Extensively enlarged uterus with multiple hypodense fibroids are seen throughout as on a prior MRI dating back to 2017. Small amount of fluid within the endometrial canal. Other:  Small amount of abdominopelvic ascites is seen. Musculoskeletal: No acute or significant osseous findings. 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 Retroperitoneal adenopathy, consistent with metastatic disease Diffuse wall thickening seen within the proximal stomach which could either be related to gastritis or possible metastatic disease. Small amount of abdominopelvic  ascites Enlarged uterus with multiple uterine fibroids These results were called by telephone at the time of interpretation on 04/06/2020 at 5:19 am to provider [facility] , who verbally acknowledged these results. Electronically Signed   By: Prudencio Pair M.D.   On: 04/06/2020 05:19   Assessment and Plan:  This is a 44 year old female with 1.  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 There is extension into the splenic hilum with diffuse splenic metastases. Retroperitoneal adenopathy, consistent with metastatic disease. Diffuse wall thickening seen within the proximal stomach which could either be related to gastritis or possible metastatic disease. Small amount of abdominopelvic ascites. Enlarged uterus with multiple uterine fibroids"  2.  Microcytic anemia secondary to iron deficiency -Labs from 03/28/2020-ferritin 148, iron 14, TIBC 397, percent saturation 4%  3.  HIV -Follow-up at [facility] infectious  disease -Maintained on Biktarvy -03/28/2020 HIV RNA was undetectable, CD4 220, % helper lymphocytes 16%  4.  Hepatitis B -09/07/2019 HBV DNA undetected  PLAN: -CT scan findings discussed with the patient and her husband.  We discussed that scans are concerning for underlying malignancy.  We discussed potential differentials such as lymphoma versus pancreatic cancer versus other. -Recommend biopsy by interventional radiology of pancreatic mass or one of the enlarged retroperitoneal lymph nodes.  If IR unable to take biopsy, then would recommend GI consult to see if they can obtain biopsy. -Discussed with the patient and her husband that we need to await the biopsy results before further discussion of diagnosis, prognosis, and treatment options. -Hemoglobin appears to be overall stable compared to labs obtained at [facility].  She takes oral iron per infectious disease and was told recently she no longer needed to take this due to adequate iron stores.  Recommend close monitoring of her CBC. -Continue Biktarvy for HIV.  She will continue outpatient follow-up with infectious disease.  Thank you for this referral.   Mikey Bussing, DNP, AGPCNP-BC, AOCNP  ADDENDUM  .Patient was Personally and independently interviewed, examined and relevant elements of the history of present illness were reviewed in details and an assessment and plan was created. All elements of the patient's history of present illness , assessment and plan were discussed in details with Mikey Bussing, DNP. The above documentation reflects our combined findings assessment and plan.  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  Hemoglobin C], childhood malaria. Patient notes that she had a  CT of the abdomen sometime in 2020 that showed no acute pathology.  This was not [facility].  Not accessible to [place] at this point. She did have an MRI of the abdomen in July 2019 which showed indeterminate splenic lesions.  No other acute abdominal pathology.  No concern with hepatocellular carcinoma.  Patient is presenting now with #1 upper abdominal pain bloating -CT scan of the abdomen showing extensive heterogenous mass involving the pancreas along with retroperitoneal lymphadenopathy and diffuse splenic lesions. No internal necrosis within the mass noted. Diffuse splenomegaly. Left periaortic lymph  node causing mild displacement of the third portion of the duodenum.    Overall picture concerning for possible primary pancreatic malignancy.  Hepatitis B and HIV could be risk factors for early pancreatic cancer. Other possibility given presentation with significant splenomegaly could be possible high-grade lymphoma. Other possibilities include Castleman's disease. Patient symptomatology has developed rather quickly over the last few weeks.  #2 microcytic anemia-chronic.  Some element of iron deficiency versus hemoglobinopathy versus anemia of chronic disease related to malignancy.  #3 history of HIV/AIDS follows with Dr. Melynda Keller and at [facility].  Plan -Patient has been seen by interventional radiology we will plan for a CT-guided biopsy of the pancreatic mass or retroperitoneal lymphadenopathy tomorrow.  Will need core biopsies including for flow cytometry since lymphoma is high on the differential. -CT chest with contrast to complete radiographic staging-We will send out labs in the morning including CBC, CMP, tumor markers CA 19-9, CEA, LDH, sedimentation rate. -Ferritin and iron profile hemoglobin electrophoresis for evaluation of microcytic anemia. -Pain management as per hospital medicine -Oncology will continue to follow.   Sullivan Lone MD MS

## 2020-04-08 ENCOUNTER — Inpatient Hospital Stay (HOSPITAL_COMMUNITY): Payer: 59

## 2020-04-08 ENCOUNTER — Encounter (HOSPITAL_COMMUNITY): Payer: Self-pay | Admitting: Family Medicine

## 2020-04-08 ENCOUNTER — Other Ambulatory Visit: Payer: Self-pay

## 2020-04-08 DIAGNOSIS — D509 Iron deficiency anemia, unspecified: Secondary | ICD-10-CM

## 2020-04-08 DIAGNOSIS — R590 Localized enlarged lymph nodes: Secondary | ICD-10-CM

## 2020-04-08 LAB — RETICULOCYTES
Immature Retic Fract: 26.4 % — ABNORMAL HIGH (ref 2.3–15.9)
RBC.: 4.18 MIL/uL (ref 3.87–5.11)
Retic Count, Absolute: 67.3 10*3/uL (ref 19.0–186.0)
Retic Ct Pct: 1.6 % (ref 0.4–3.1)

## 2020-04-08 LAB — CBC WITH DIFFERENTIAL/PLATELET
Abs Immature Granulocytes: 0.03 10*3/uL (ref 0.00–0.07)
Basophils Absolute: 0 10*3/uL (ref 0.0–0.1)
Basophils Relative: 1 %
Eosinophils Absolute: 0.2 10*3/uL (ref 0.0–0.5)
Eosinophils Relative: 2 %
HCT: 29.3 % — ABNORMAL LOW (ref 36.0–46.0)
Hemoglobin: 9.1 g/dL — ABNORMAL LOW (ref 12.0–15.0)
Immature Granulocytes: 0 %
Lymphocytes Relative: 6 %
Lymphs Abs: 0.4 10*3/uL — ABNORMAL LOW (ref 0.7–4.0)
MCH: 21.5 pg — ABNORMAL LOW (ref 26.0–34.0)
MCHC: 31.1 g/dL (ref 30.0–36.0)
MCV: 69.3 fL — ABNORMAL LOW (ref 80.0–100.0)
Monocytes Absolute: 0.8 10*3/uL (ref 0.1–1.0)
Monocytes Relative: 11 %
Neutro Abs: 6.1 10*3/uL (ref 1.7–7.7)
Neutrophils Relative %: 80 %
Platelets: 486 10*3/uL — ABNORMAL HIGH (ref 150–400)
RBC: 4.23 MIL/uL (ref 3.87–5.11)
RDW: 16.6 % — ABNORMAL HIGH (ref 11.5–15.5)
WBC: 7.5 10*3/uL (ref 4.0–10.5)
nRBC: 0 % (ref 0.0–0.2)

## 2020-04-08 LAB — IRON AND TIBC
Iron: 15 ug/dL — ABNORMAL LOW (ref 28–170)
Saturation Ratios: 5 % — ABNORMAL LOW (ref 10.4–31.8)
TIBC: 312 ug/dL (ref 250–450)
UIBC: 297 ug/dL

## 2020-04-08 LAB — PROTIME-INR
INR: 1.3 — ABNORMAL HIGH (ref 0.8–1.2)
Prothrombin Time: 15.5 seconds — ABNORMAL HIGH (ref 11.4–15.2)

## 2020-04-08 LAB — BODY FLUID CELL COUNT WITH DIFFERENTIAL
Eos, Fluid: 1 %
Lymphs, Fluid: 76 %
Monocyte-Macrophage-Serous Fluid: 20 % — ABNORMAL LOW (ref 50–90)
Neutrophil Count, Fluid: 3 % (ref 0–25)
Total Nucleated Cell Count, Fluid: 1208 cu mm — ABNORMAL HIGH (ref 0–1000)

## 2020-04-08 LAB — PROTEIN, PLEURAL OR PERITONEAL FLUID: Total protein, fluid: 5 g/dL

## 2020-04-08 LAB — LACTATE DEHYDROGENASE: LDH: 721 U/L — ABNORMAL HIGH (ref 98–192)

## 2020-04-08 LAB — AMYLASE: Amylase: 104 U/L — ABNORMAL HIGH (ref 28–100)

## 2020-04-08 LAB — LIPASE, BLOOD: Lipase: 83 U/L — ABNORMAL HIGH (ref 11–51)

## 2020-04-08 LAB — LACTATE DEHYDROGENASE, PLEURAL OR PERITONEAL FLUID: LD, Fluid: 1257 U/L — ABNORMAL HIGH (ref 3–23)

## 2020-04-08 LAB — FERRITIN: Ferritin: 144 ng/mL (ref 11–307)

## 2020-04-08 LAB — SEDIMENTATION RATE: Sed Rate: 18 mm/hr (ref 0–22)

## 2020-04-08 LAB — URIC ACID: Uric Acid, Serum: 5.1 mg/dL (ref 2.5–7.1)

## 2020-04-08 MED ORDER — FENTANYL CITRATE (PF) 100 MCG/2ML IJ SOLN
INTRAMUSCULAR | Status: AC | PRN
  Administered 2020-04-08 (×2): 50 ug via INTRAVENOUS

## 2020-04-08 MED ORDER — FENTANYL CITRATE (PF) 100 MCG/2ML IJ SOLN
INTRAMUSCULAR | Status: AC
  Filled 2020-04-08: qty 2

## 2020-04-08 MED ORDER — LIDOCAINE HCL (PF) 1 % IJ SOLN
INTRAMUSCULAR | Status: AC | PRN
  Administered 2020-04-08: 10 mL via INTRADERMAL

## 2020-04-08 MED ORDER — SODIUM CHLORIDE 0.9 % IV SOLN
510.0000 mg | Freq: Once | INTRAVENOUS | Status: AC
  Administered 2020-04-08: 510 mg via INTRAVENOUS
  Filled 2020-04-08: qty 17
  Filled 2020-04-08: qty 510

## 2020-04-08 MED ORDER — SODIUM CHLORIDE 0.9 % IV SOLN
INTRAVENOUS | Status: AC
  Filled 2020-04-08: qty 250

## 2020-04-08 MED ORDER — LIDOCAINE HCL 1 % IJ SOLN
INTRAMUSCULAR | Status: AC
  Filled 2020-04-08: qty 20

## 2020-04-08 MED ORDER — FLUMAZENIL 0.5 MG/5ML IV SOLN
INTRAVENOUS | Status: AC
  Filled 2020-04-08: qty 5

## 2020-04-08 MED ORDER — MIDAZOLAM HCL 2 MG/2ML IJ SOLN
INTRAMUSCULAR | Status: AC | PRN
  Administered 2020-04-08 (×2): 1 mg via INTRAVENOUS

## 2020-04-08 MED ORDER — NALOXONE HCL 0.4 MG/ML IJ SOLN
INTRAMUSCULAR | Status: AC
  Filled 2020-04-08: qty 1

## 2020-04-08 MED ORDER — MIDAZOLAM HCL 2 MG/2ML IJ SOLN
INTRAMUSCULAR | Status: AC
  Filled 2020-04-08: qty 4

## 2020-04-08 NOTE — Procedures (Signed)
Interventional Radiology Procedure Note  Procedure: CT guided biopsy  Complications: None  Estimated Blood Loss: None  Recommendations: - Bedrest x 1 hr - Path sent   Signed,  Criselda Peaches, MD

## 2020-04-08 NOTE — Procedures (Signed)
Ultrasound-guided diagnostic and therapeutic left thoracentesis performed yielding 1.5 liters of blood-tinged fluid. No immediate complications. Follow-up chest x-ray pending. A portion of the fluid was sent to the lab for preordered studies. Due to pt chest discomfort/coughing only the above amount of fluid was removed today.

## 2020-04-08 NOTE — Progress Notes (Addendum)
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   44 year old female with significant past medical history for HIV/AIDS and chronic hepatitis B who presents with abdominal pain, urinary satiety and postprandial vomiting. Initial physical examination she is afebrile, blood pressure 132/86, heart rate 112, respiratory rate 18, oxygen saturation 99% on room air. She has mild pallor, her lungs are clear to auscultation bilaterally, heart S1-S2, present rhythmic, soft abdomen, mildly distended, no lower extremity edema. Sodium 137, potassium 3.9, chloride 101, bicarb 25, glucose 126, BUN 10, creatinine 0.74, white count 7.0, hemoglobin 9.3, hematocrit 29.5, platelets 554. SARS COVID-19 negative. Urine analysis negative for infection. CT of the abdomen pelvis with extensively a large heterogeneous mass involving nearly the entire pancreas. Retroperitoneal adenopathy. Splenic metastasis. Diffuse wall thickening proximal stomach.  Patient underwent CT guided biopsy of pancreatic mass. She has been found to have also a left pleural effusion.   Assessment & Plan:   Principal Problem:   Pancreatic mass Active Problems:   Severe anemia   Fibroid uterus   Hepatitis B   HIV (human immunodeficiency virus infection) (HCC)   Microcytic anemia   Retroperitoneal lymphadenopathy   1.Newly diagnosed pancreatic mass withplenic metastasis and retroperitoneal adenopathy.sp pancreatic mass CT guided biopsy.   On acetaminophen, ibuprofen and ondansetron for symptom control.  NEW Large pleural effusion on CT chest done last night, will plan to do thoracentesis during this hospitalization due to risk of worsening fluid accumilation and compromising ventilation.    Continue to follow up with oncology recommendations, tumor markers. Resume diet post procedure and will decrease fluids rate to 50 ml per H.     2.HIV AIDS.Follows with [facility], last visit 03/2420. Her disease has been stable on antiretroviral therapy.  She is currently on Biktarvy, recent CD4 and VL were 180 (CD4% 17,7)and 24.3 respectively(09/2019)  She will follow up as outpatient.  3.Iron deficiency anemia.Iron panel with serum iron 15, TIBC 312, transferrin saturation 5 and ferritin 144.   Patient may benefit from IV iron during this hospitalization.    Status is: Inpatient  Remains inpatient appropriate because:Inpatient level of care appropriate due to severity of illness   Dispo: The patient is from: Home              Anticipated d/c is to: Home              Anticipated d/c date is: 2 days              Patient currently is not medically stable to d/c.   DVT prophylaxis: Enoxaparin   Code Status:   full  Family Communication:  I spoke with patient's husband at the bedside, we talked in detail about patient's condition, plan of care and prognosis and all questions were addressed.     Consultants:   IR   Oncology   Procedures:   Pancreatic mass biopsy   Antimicrobials:       Subjective: Patient continue to have intermittent abdominal pain, no nausea or vomiting, no chest pain or dyspnea.   Objective: Vitals:   04/07/20 1324 04/07/20 1445 04/07/20 2137 04/08/20 0523  BP: (!) 133/94  (!) 121/93 125/80  Pulse: (!) 108  99 (!) 106  Resp: 16  17 19   Temp: 98.8 F (  37.1 C)  98.3 F (36.8 C) 98.7 F (37.1 C)  TempSrc: Oral     SpO2: 97% 97% 98% 98%  Weight:      Height:        Intake/Output Summary (Last 24 hours) at 04/08/2020 1223 Last data filed at 04/07/2020 2218 Gross per 24 hour  Intake 120 ml  Output --  Net 120 ml   Filed Weights   04/06/20 0320  Weight: 60.5 kg    Examination:   General: Not  in pain or dyspnea.  Neurology: Awake and alert, non focal  E ENT: mild pallor, no icterus, oral mucosa moist Cardiovascular: No JVD. S1-S2 present, rhythmic, no gallops, rubs, or murmurs. No lower extremity edema. Pulmonary: positive breath sounds bilaterally, adequate air movement, no wheezing, rhonchi or rales. Gastrointestinal. Abdomen with mild distention with no organomegaly, non tender, no rebound or guarding Skin. No rashes Musculoskeletal: no joint deformities     Data Reviewed: I have personally reviewed following labs and imaging studies  CBC: Recent Labs  Lab 04/06/20 0350 04/07/20 0537 04/08/20 0516  WBC 7.0 7.2 7.5  NEUTROABS 5.1  --  6.1  HGB 9.3* 9.5* 9.1*  HCT 29.5* 30.4* 29.3*  MCV 67.4* 69.6* 69.3*  PLT 554* 496* 188*   Basic Metabolic Panel: Recent Labs  Lab 04/06/20 0350 04/07/20 0537  NA 137 135  K 3.9 4.0  CL 101 100  CO2 25 25  GLUCOSE 126* 120*  BUN 10 <5*  CREATININE 0.74 0.68  CALCIUM 9.4 9.1   GFR: Estimated Creatinine Clearance: 73.7 mL/min (by C-G formula based on SCr of 0.68 mg/dL). Liver Function Tests: Recent Labs  Lab 04/06/20 0350 04/07/20 0537  AST 21 25  ALT 12 11  ALKPHOS 87 83  BILITOT 0.4 0.5  PROT 7.2 6.5  ALBUMIN 3.0* 2.9*   Recent Labs  Lab 04/06/20 0350 04/08/20 0516  LIPASE 123* 83*  AMYLASE  --  104*   No results for input(s): AMMONIA in the last 168 hours. Coagulation Profile: Recent Labs  Lab 04/08/20 0516  INR 1.3*   Cardiac Enzymes: No results for input(s): CKTOTAL, CKMB, CKMBINDEX, TROPONINI in the last 168 hours. BNP (last 3 results) No results for input(s): PROBNP in the last 8760 hours. HbA1C: No results for input(s): HGBA1C in the last 72 hours. CBG: No results for input(s): GLUCAP in the last 168 hours. Lipid Profile: No results for input(s): CHOL, HDL, LDLCALC, TRIG, CHOLHDL, LDLDIRECT in the last 72 hours. Thyroid Function Tests: No results for input(s): TSH, T4TOTAL, FREET4,  T3FREE, THYROIDAB in the last 72 hours. Anemia Panel: Recent Labs    04/08/20 0516  FERRITIN 144  TIBC 312  IRON 15*  RETICCTPCT 1.6      Radiology Studies: I have reviewed all of the imaging during this hospital visit personally     Scheduled Meds: . bictegravir-emtricitabine-tenofovir AF  1 tablet Oral Daily  . docusate sodium  100 mg Oral BID  . [START ON 04/09/2020] enoxaparin (LOVENOX) injection  40 mg Subcutaneous Q24H  . multivitamin with minerals  1 tablet Oral Daily  . pantoprazole (PROTONIX) IV  40 mg Intravenous Q12H   Continuous Infusions: . dextrose 5% lactated ringers 75 mL/hr at 04/07/20 2218     LOS: 2 days        Marguis Mathieson Gerome Apley, MD

## 2020-04-09 DIAGNOSIS — J90 Pleural effusion, not elsewhere classified: Secondary | ICD-10-CM

## 2020-04-09 LAB — GRAM STAIN

## 2020-04-09 LAB — CANCER ANTIGEN 19-9: CA 19-9: 80 U/mL — ABNORMAL HIGH (ref 0–35)

## 2020-04-09 LAB — CEA: CEA: 1.2 ng/mL (ref 0.0–4.7)

## 2020-04-09 MED ORDER — SODIUM CHLORIDE 0.9 % IV SOLN: 510.0000 mg | Freq: Once | INTRAVENOUS | Status: DC

## 2020-04-09 MED ORDER — PREDNISONE 20 MG PO TABS: 60.0000 mg | ORAL_TABLET | Freq: Every day | ORAL | 0 refills | Status: DC

## 2020-04-09 MED ORDER — PREDNISONE 20 MG PO TABS
60.0000 mg | ORAL_TABLET | Freq: Every day | ORAL | Status: DC
  Administered 2020-04-09: 60 mg via ORAL
  Filled 2020-04-09: qty 3

## 2020-04-09 MED ORDER — PANTOPRAZOLE SODIUM 40 MG PO TBEC: 40.0000 mg | DELAYED_RELEASE_TABLET | Freq: Every day | ORAL | Status: DC

## 2020-04-09 MED ORDER — PANTOPRAZOLE SODIUM 40 MG PO TBEC: 40.0000 mg | DELAYED_RELEASE_TABLET | Freq: Every day | ORAL | 0 refills | Status: DC

## 2020-04-09 MED ORDER — SODIUM CHLORIDE 0.9 % IV BOLUS
500.0000 mL | Freq: Once | INTRAVENOUS | Status: AC
  Administered 2020-04-09: 500 mL via INTRAVENOUS

## 2020-04-09 NOTE — Progress Notes (Signed)
Orthostatic vital signs completed as ordered.  Today's Vitals   04/09/20 1138 04/09/20 1141 04/09/20 1150 04/09/20 1154  BP: (!) 142/101 (!) 138/98 (!) 142/103 (!) 140/101  Pulse: (!) 105 (!) 119 (!) 119 (!) 120  Resp: 18 18 18 18   Temp: 98.6 F (37 C) 98.6 F (37 C)    TempSrc: Oral Oral    SpO2: 98% 96% 95% 95%  Weight:      Height:      PainSc:       Body mass index is 26.03 kg/m.

## 2020-04-09 NOTE — Progress Notes (Signed)
Heather [name]   HEMATOLOGY/ONCOLOGY INPATIENT PROGRESS NOTE  Date of Service: 04/09/2020  Inpatient Attending: .No att. providers found   SUBJECTIVE  Patient was seen last evening and this morning for f/u. CT chest had showed large left sidded pleural effusion and lung collapse and patient had US guided thoracentesis. Labs with microcytic anemia related to iron def and anemia or chronic disease , HgB electrophoresis pending. LDH significantly elevated. CA 19-9, CEA not impressive. Had CT guided Bx of pancreatic mass/peripancreatic LN --results pending . I called pathology and asked them to expedited the process. Patient received and tolerated IV feraheme. Eating better this AM. No abdominal pain,nasuea or vomiting.    OBJECTIVE:  NAD  PHYSICAL EXAMINATION: . Vitals:   04/09/20 1141 04/09/20 1150 04/09/20 1154 04/09/20 1258  BP: (!) 138/98 (!) 142/103 (!) 140/101 139/89  Pulse: (!) 119 (!) 119 (!) 120 (!) 103  Resp: 18 18 18 18   Temp: 98.6 F (37 C)     TempSrc: Oral     SpO2: 96% 95% 95% 96%  Weight:      Height:       Filed Weights   04/06/20 0320  Weight: 133 lb 4.8 oz (60.5 kg)   .Body mass index is 26.03 kg/m.  GENERAL:alert, in no acute distress and comfortable SKIN: skin color, texture, turgor are normal, no rashes or significant lesions EYES: normal, conjunctiva are pink and non-injected, sclera clear OROPHARYNX:no exudate, no erythema and lips, buccal mucosa, and tongue normal  NECK: supple, no JVD, thyroid normal size, non-tender, without nodularity LYMPH:  no palpable lymphadenopathy in the cervical, axillary or inguinal LUNGS: decreased breath sounds left lung base about1/3 up chest wall posteriorly. HEART: regular rate & rhythm,  no murmurs and no lower extremity edema ABDOMEN: abdomen soft, non-tender, normoactive bowel sounds  Musculoskeletal: no cyanosis of digits and no clubbing  PSYCH: alert & oriented x 3 with fluent speech NEURO: no focal motor/sensory  deficits  MEDICAL HISTORY:  Past Medical History:  Diagnosis Date  . Anemia   . 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 History of blood transfusion    childhood  . HIV (human immunodeficiency virus infection) ([place])   . Immune deficiency disorder [facility])     SURGICAL HISTORY: Past Surgical History:  Procedure Laterality Date  . CHROMOPERTUBATION Bilateral 11/29/2016   Procedure: CHROMOPERTUBATION;  Surgeon: Waymon Amato, MD;  Location: [facility];  Service: Gynecology;  Laterality: Bilateral;  fallopian tubes  . ENDOMETRIAL BIOPSY    . MYOMECTOMY N/A 11/29/2016   Procedure: MYOMECTOMY;  Surgeon: Waymon Amato, MD;  Location: [facility];  Service: Gynecology;  Laterality: N/A;    SOCIAL HISTORY: Social History   Socioeconomic History  . Marital status: Married    Spouse name: Not on file  . Number of children: Not on file  . Years of education: Not on file  . Highest education level: Not on file  Occupational History  . Not on file  Tobacco Use  . Smoking status: Never Smoker  . Smokeless tobacco: Never Used  Substance and Sexual Activity  . Alcohol use: Yes    Comment: occ  . Drug use: No  . Sexual activity: Yes  Other Topics Concern  . Not on file  Social History Narrative  . Not on file   Social Determinants of Health   Financial Resource Strain:   . Difficulty of Paying Living Expenses:   Food Insecurity:   . Worried About Charity fundraiser  in the Last Year:   . [place] in the Last Year:   Transportation Needs:   . Film/video editor (Medical):   Heather [name] Lack of Transportation (Non-Medical):   Physical Activity:   . Days of Exercise per Week:   . Minutes of Exercise per Session:   Stress:   . Feeling of Stress :   Social Connections:   . Frequency of Communication with Friends and Family:   . Frequency of Social Gatherings with Friends and Family:   . Attends Religious Services:   . Active Member of Clubs or Organizations:   . Attends Theatre manager Meetings:   Heather [name] Marital Status:   Intimate Partner Violence:   . Fear of Current or Ex-Partner:   . Emotionally Abused:   Heather [name] Physically Abused:   . Sexually Abused:     FAMILY HISTORY: Family History  Problem Relation Age of Onset  . Diabetes Mother   . Hypertension Mother     ALLERGIES:  has No Known Allergies.  MEDICATIONS:  Scheduled Meds: . bictegravir-emtricitabine-tenofovir AF  1 tablet Oral Daily  . docusate sodium  100 mg Oral BID  . enoxaparin (LOVENOX) injection  40 mg Subcutaneous Q24H  . multivitamin with minerals  1 tablet Oral Daily  . [START ON 04/10/2020] pantoprazole  40 mg Oral Daily  . predniSONE  60 mg Oral Q breakfast   Continuous Infusions: . dextrose 5% lactated ringers 50 mL/hr at 04/08/20 1545   PRN Meds:.acetaminophen **OR** acetaminophen, ibuprofen, morphine injection, ondansetron **OR** ondansetron (ZOFRAN) IV  REVIEW OF SYSTEMS:    10 Point review of Systems was done is negative except as noted above.   LABORATORY DATA:  I have reviewed the data as listed  . CBC Latest Ref Rng & Units 04/08/2020 04/07/2020 04/06/2020  WBC 4.0 - 10.5 K/uL 7.5 7.2 7.0  Hemoglobin 12.0 - 15.0 g/dL 9.1(L) 9.5(L) 9.3(L)  Hematocrit 36.0 - 46.0 % 29.3(L) 30.4(L) 29.5(L)  Platelets 150 - 400 K/uL 486(H) 496(H) 554(H)    . CMP Latest Ref Rng & Units 04/07/2020 04/06/2020 11/29/2017  Glucose 70 - 99 mg/dL 120(H) 126(H) 101(H)  BUN 6 - 20 mg/dL <5(L) 10 8  Creatinine 0.44 - 1.00 mg/dL 0.68 0.74 0.68  Sodium 135 - 145 mmol/L 135 137 134(L)  Potassium 3.5 - 5.1 mmol/L 4.0 3.9 3.7  Chloride 98 - 111 mmol/L 100 101 104  CO2 22 - 32 mmol/L 25 25 24   Calcium 8.9 - 10.3 mg/dL 9.1 9.4 8.9  Total Protein 6.5 - 8.1 g/dL 6.5 7.2 9.0(H)  Total Bilirubin 0.3 - 1.2 mg/dL 0.5 0.4 0.4  Alkaline Phos 38 - 126 U/L 83 87 55  AST 15 - 41 U/L 25 21 29   ALT 0 - 44 U/L 11 12 19      RADIOGRAPHIC STUDIES: I have personally reviewed the radiological images as listed and agreed  with the findings in the report. DG Chest 1 View  Result Date: 04/08/2020 CLINICAL DATA:  Post thoracentesis EXAM: CHEST  1 VIEW COMPARISON:  CT 04/07/2020 FINDINGS: Moderate to large left pleural effusion remains following thoracentesis. No pneumothorax. Right lung clear. Heart is normal size. No effusions. IMPRESSION: Moderate to large left effusion with left base atelectasis. No pneumothorax. Electronically Signed   By: Rolm Baptise M.D.   On: 04/08/2020 16:55   CT CHEST W CONTRAST  Result Date: 04/07/2020 CLINICAL DATA:  44 year old female with cancer of unknown primary. Pancreatic mass noted on the CT of  the abdomen pelvis dated 04/06/2020. EXAM: CT CHEST WITH CONTRAST TECHNIQUE: Multidetector CT imaging of the chest was performed during intravenous contrast administration. CONTRAST:  21mL OMNIPAQUE IOHEXOL 300 MG/ML  SOLN COMPARISON:  Chest CT dated 11/29/2017. FINDINGS: Cardiovascular: There is no cardiomegaly or pericardial effusion. The thoracic aorta is unremarkable. The origins of the great vessels of the aortic arch appear patent as visualized. Evaluation of the pulmonary arteries is very limited due to suboptimal opacification and timing of the contrast. No definite large central pulmonary artery embolus identified. V/Q scan may provide better evaluation if there is high clinical concern for acute PE. Mediastinum/Nodes: No hilar or mediastinal adenopathy. The esophagus and the thyroid gland are grossly unremarkable. No mediastinal fluid collection. Lungs/Pleura: Large left pleural effusion with near complete compressive atelectasis of the left lower lobe and partial right upper lobe. Pneumonia is not excluded. Clinical correlation is recommended. The right lung is clear. There is no pneumothorax. There is slight shift of the mediastinum to the right of the midline. The central airways are patent. Upper Abdomen: Large heterogeneous upper abdominal mass with invasion of the spleen. Musculoskeletal:  No acute osseous pathology. IMPRESSION: 1. Large left pleural effusion with near complete compressive atelectasis of the left lower lobe and partial right upper lobe. Pneumonia is not excluded. Clinical correlation is recommended. Consider thoracentesis for symptomatic relief. 2. Large heterogeneous upper abdominal mass with invasion of the spleen. Electronically Signed   By: Anner Crete M.D.   On: 04/07/2020 23:31   CT ABDOMEN PELVIS W CONTRAST  Result Date: 04/06/2020 CLINICAL DATA:  Periodically epigastric pain EXAM: CT ABDOMEN AND PELVIS WITH CONTRAST TECHNIQUE: Multidetector CT imaging of the abdomen and pelvis was performed using the standard protocol following bolus administration of intravenous contrast. CONTRAST:  133mL OMNIPAQUE IOHEXOL 300 MG/ML  SOLN COMPARISON:  None. FINDINGS: Lower chest: The visualized heart size within normal limits. No pericardial fluid/thickening. No hiatal hernia. There is a small to moderate left pleural effusion with basilar atelectasis. Hepatobiliary: The liver is normal in density without focal abnormality.The main portal vein is patent. No evidence of calcified gallstones, gallbladder wall thickening or biliary dilatation. Pancreas: There is a extensive heterogeneously enhancing mass involving nearly the entirety of the pancreatic body and tail. Only a small portion of the pancreatic head appears to be spared. The SMV and SMA appear to be patent. No areas of internal necrosis seen within the mass. The mass appears to extend into the splenic hilum. Spleen: Extensive large multiple heterogeneously enhancing hypodense masses seen throughout the splenic parenchyma likely from the extension of the pancreatic mass which extends through the splenic hilum. There is diffuse splenomegaly. The splenic vein appears to be narrowed wall entering the splenic hilum. Adrenals/Urinary Tract: Both adrenal glands appear normal. The kidneys and collecting system appear normal without  evidence of urinary tract calculus or hydronephrosis. Bladder is unremarkable. Stomach/Bowel: There appears to be diffuse wall thickening seen within the proximal stomach with with the heterogeneous mass seen encompassing the proximal portion. The remainder of the small-bowel and colon are unremarkable. There is a moderate amount of colonic stool present. Vascular/Lymphatic: There is a left periaortic nodule/lymph node causing mild displacement of the third portion of the duodenum measuring 2.8 x 2.7 cm. Scattered small left-sided periaortic lymph nodes are also noted. Reproductive: Extensively enlarged uterus with multiple hypodense fibroids are seen throughout as on a prior MRI dating back to 2017. Small amount of fluid within the endometrial canal. Other: Small amount of abdominopelvic ascites  is seen. Musculoskeletal: No acute or significant osseous findings. 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 Retroperitoneal adenopathy, consistent with metastatic disease Diffuse wall thickening seen within the proximal stomach which could either be related to gastritis or possible metastatic disease. Small amount of abdominopelvic ascites Enlarged uterus with multiple uterine fibroids These results were called by telephone at the time of interpretation on 04/06/2020 at 5:19 am to provider [facility] , who verbally acknowledged these results. Electronically Signed   By: Prudencio Pair M.D.   On: 04/06/2020 05:19   CT BIOPSY  Result Date: 04/08/2020 INDICATION: 44 year old female with pancreatic mass versus peripancreatic adenopathy and associated involvement of the spleen. Findings are concerning for possible lymphoma. Patient presents for CT-guided core biopsy to obtain tissue diagnosis. EXAM:  CT-guided core biopsy MEDICATIONS: None. ANESTHESIA/SEDATION: Moderate (conscious) sedation was employed during this procedure. A total of Versed 2 mg and Fentanyl 100 mcg was administered intravenously. Moderate Sedation Time: 12 minutes. The patient's level of consciousness and vital signs were monitored continuously by radiology nursing throughout the procedure under my direct supervision. FLUOROSCOPY TIME:  None. COMPLICATIONS: None immediate. PROCEDURE: Informed written consent was obtained from the patient after a thorough discussion of the procedural risks, benefits and alternatives. All questions were addressed. A timeout was performed prior to the initiation of the procedure. A planning axial CT scan was performed. The mass was identified. A suitable skin entry site was selected and marked. Local anesthesia was attained by infiltration with 1% lidocaine. A small dermatotomy was made. Under intermittent CT guidance, a 17 gauge introducer needle was carefully advanced into the margin of the mass. Multiple 18 gauge core biopsies were then obtained coaxially using the bio Pince automated biopsy device. Biopsy specimens were placed in saline and delivered to pathology for further analysis. Gel-Foam was injected through the introducer needle as it was removed. Post biopsy CT imaging demonstrates no evidence of hematoma or other immediate complication. The patient tolerated the procedure well. IMPRESSION: Technically successful CT-guided biopsy of pancreatic mass versus peripancreatic lymphadenopathy. Electronically Signed   By: Jacqulynn Cadet M.D.   On: 04/08/2020 16:32   US THORACENTESIS ASP PLEURAL SPACE W/IMG GUIDE  Result Date: 04/08/2020 INDICATION: Patient with history of HIV, pancreatic mass, retroperitoneal adenopathy, ascites, splenic masses, left pleural effusion. Request received for diagnostic and therapeutic left thoracentesis. EXAM: ULTRASOUND GUIDED DIAGNOSTIC AND THERAPEUTIC LEFT  THORACENTESIS MEDICATIONS: None COMPLICATIONS: None immediate. PROCEDURE: An ultrasound guided thoracentesis was thoroughly discussed with the patient and questions answered. The benefits, risks, alternatives and complications were also discussed. The patient understands and wishes to proceed with the procedure. Written consent was obtained. Ultrasound was performed to localize and mark an adequate pocket of fluid in the left chest. The area was then prepped and draped in the normal sterile fashion. 1% Lidocaine was used for local anesthesia. Under ultrasound guidance a 6 Fr Safe-T-Centesis catheter was introduced. Thoracentesis was performed. The catheter was removed and a dressing applied. FINDINGS: A total of approximately 1.5 liters of blood-tinged fluid was removed. Samples were sent to the laboratory as requested by the clinical team. Due to patient chest discomfort and coughing only the above amount of fluid was removed today. IMPRESSION: Successful ultrasound guided diagnostic and therapeutic left thoracentesis yielding 1.5 liters of pleural fluid. Read by: Rowe Robert, PA-C Electronically Signed  By: Jacqulynn Cadet M.D.   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  Hemoglobin C], childhood malaria. Patient notes that she had a CT of the abdomen sometime in 2020 that showed no acute pathology.  This was not [facility].  Not accessible to [place] at this point. She did have an MRI of the abdomen in July 2019 which showed indeterminate splenic lesions.  No other acute abdominal pathology.  No concern with hepatocellular carcinoma.  Patient is presenting now with  #1Pancreatic mass along with retroperitoneal  lymphadenopathy and diffuse splenic lesions. No internal necrosis within the mass noted. Diffuse splenomegaly. Left periaortic lymph node causing mild displacement of the third portion of the duodenum.    Significantly elevated LDH levels. CA 19-9 and CEA levels unrevealing Component     Latest Ref Rng & Units 04/08/2020  LDH     98 - 192 U/L 721 (H)  CA 19-9     0 - 35 U/mL 80 (H)  CEA     0.0 - 4.7 ng/mL 1.2   # 2 Left sided large pleural effusion and lung atelectasis. S/p diagnostic and therapeutic thoracentesis -- lymphocytic predominance @ 80% Component     Latest Ref Rng & Units 04/08/2020  Fluid Type-FCT      CYTO PLEU  Color, Fluid     YELLOW RED (A)  Appearance, Fluid     CLEAR TURBID (A)  Total Nucleated Cell Count, Fluid     0 - 1,000 cu mm 1,208 (H)  Neutrophil Count, Fluid     0 - 25 % 3  Lymphs, Fluid     % 76  Monocyte-Macrophage-Serous Fluid     50 - 90 % 20 (L)  Eos, Fluid     % 1  Other Cells, Fluid     % CORRELATE WITH CYTOLOGY.   Overall picture concerning for possible  High grade B cell lymphoma vs primary pancreatic malignancy (though CA 19-9 levels indeterminate and not significantly elevated.  HIV could be risk factors for high grade EBV driven lymphomas  Patient symptomatology has developed rather quickly over the last few weeks.  #3 microcytic anemia-chronic.  Some element of iron deficiency versus hemoglobinopathy versus anemia of chronic disease related to malignancy. Patient has received 1 dose of IV Feraheme Will rpt 2nd dose in 1 week.  #4 history of HIV/AIDS follows with Dr. Melynda Keller and at [place].  Plan -reviewed labs, Pleural fluid results and imaging studies in details with patient and her husband at bedside -discussed diagnostic concerns -Core needle biopsy and Pleural fluid cytology pending - will f/u as outpatient. 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 Uric acid wnl -counseled patient to come to ED for worsening abdominal pain or increasing shortness of breath -will f/u with patient in oncology clinic on Tuesday 04/12/2020  I spent 45 minutes counseling the patient face to face. The total time spent in the appointment was 60 minutes and more than 50% was on counseling and direct patient cares and co-ordination of cares with hospital medicine.    Sullivan Lone MD [place] AAHIVMS [facility]  [facility] Hematology/Oncology Physician [facility]  (Office):       [phone] (Work cell):  [phone] (Fax):           [phone]

## 2020-04-09 NOTE — Discharge Summary (Addendum)
Physician Discharge Summary  [facility] MWN:[id number] DOB: [birth year] DOA: 04/06/2020  PCP: Katherina Mires, MD  Admit date: 04/06/2020 Discharge date: 04/09/2020  Admitted From: Home  Disposition:  Home   Recommendations for Outpatient Follow-up and new medication changes:  1. Follow up with Dr. Doreene Nest in 7 days.  2. Follow with Dr Irene Limbo as scheduled 3. Patient placed on prednisone 60 mg daily until follow up with oncology. 4. Started on pantoprazole for dyspepsia.  5. She received one dose of IV iron during her hospitalization.   Home Health: no  Equipment/Devices: no    Discharge Condition: stable  CODE STATUS: full  Diet recommendation: regular.   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  44 year old female with significant past medical history for HIV/AIDS and chronic hepatitis B who presents with abdominal pain, early satiety and postprandial vomiting. On herinitial physical examination she is afebrile, blood pressure 132/86, heart rate 112, respiratory rate 18, oxygen saturation 99% on room air. She has mild pallor, her lungs are clear to auscultation bilaterally, heart S1-S2, present rhythmic, soft abdomen, mildly distended, no lower extremity edema. Sodium 137, potassium 3.9, chloride 101, bicarb 25, glucose 126, BUN 10, creatinine 0.74, white count 7.0, hemoglobin 9.3, hematocrit 29.5, platelets 554. SARS COVID-19 negative. Urine analysis negative for infection. CT of the abdomen pelvis with extensively a large heterogeneous mass involving nearly the entire pancreas. Retroperitoneal adenopathy. Splenic metastasis. Diffuse wall thickening proximal stomach.  Patient underwent CT guided biopsy of pancreatic mass, pathology pending.   Incidental left pleural effusion found on imaging, underwent thoracentesis with 1,5 L of bloody tinged  fluids removed.   1.  Newly diagnosed pancreatic mass with splenic metastasis, retroperitoneal adenopathy and large left pleural effusion.  Patient was admitted to the medical, telemetry ward.  She received pain control with ibuprofen, and acetaminophen.  Nausea control with Zofran.  Initially placed on a clear liquid diet and then advance as tolerated.  Patient underwent CT guided biopsy of right pancreatic mass, pathology pending.  Further work-up with left pleural effusion thoracentesis, yielding an exudate per Light's criteria.   Fluid with 1208 nucleated cells, 76% lymphocytes, 20% monocytes.  1257 LDH and 5.0 protein.   High pretest probability for lymphoma, patient has been started on prednisone 60 mg daily, follow-up with Dr. Irene Limbo Yvonna Alanis as an outpatient.  CEA 1.2.  Discharge lipase 83, no clinical signs of pancreatitis.  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   2.  Iron deficiency anemia.  Her iron panel showed serum iron 15, TIBC 312, transferrin saturation of 5 and ferritin of 144.  Patient received 1 dose of IV iron.  3.  HIV/AIDS.  Patient follows up with [facility], last visit 03/28/2020.  She has been stable on antiretroviral therapy.  She is currently on Biktarvy, recent CD4 and VL were 180 (CD4% 17,7)and 24.3 respectively(09/2019)  4.  Chronic hepatitis B.  No signs of liver failure, follow-up as an outpatient with infectious disease.  Discharge Diagnoses:  Principal Problem:   Pancreatic mass Active Problems:   Severe anemia   Fibroid uterus   Hepatitis B   HIV (human immunodeficiency virus infection) (HCC)   Microcytic anemia   Retroperitoneal lymphadenopathy    Discharge Instructions  Discharge Instructions    Diet - low sodium heart healthy   Complete by: As directed    Discharge instructions  Complete by: As directed    Please follow with primary care in 7 days and with Dr Irene Limbo as scheduled.    Increase activity slowly   Complete by: As directed      Allergies as of 04/09/2020   No Known Allergies     Medication List    TAKE these medications   Biktarvy 50-200-25 MG Tabs tablet Generic drug: bictegravir-emtricitabine-tenofovir AF Take 1 tablet by mouth daily.   cholecalciferol 25 MCG (1000 UNIT) tablet Commonly known as: VITAMIN D3 Take 1,000 Units by mouth daily.   multivitamin with minerals Tabs tablet Take 1 tablet by mouth daily.   pantoprazole 40 MG tablet Commonly known as: PROTONIX Take 1 tablet (40 mg total) by mouth daily for 14 days. Start taking on: April 10, 2020   predniSONE 20 MG tablet Commonly known as: DELTASONE Take 3 tablets (60 mg total) by mouth daily with breakfast for 14 days. Start taking on: April 10, 2020       No Known Allergies  Consultations:  IR  Oncology    Procedures/Studies: DG Chest 1 View  Result Date: 04/08/2020 CLINICAL DATA:  Post thoracentesis EXAM: CHEST  1 VIEW COMPARISON:  CT 04/07/2020 FINDINGS: Moderate to large left pleural effusion remains following thoracentesis. No pneumothorax. Right lung clear. Heart is normal size. No effusions. IMPRESSION: Moderate to large left effusion with left base atelectasis. No pneumothorax. Electronically Signed   By: Rolm Baptise M.D.   On: 04/08/2020 16:55   CT CHEST W CONTRAST  Result Date: 04/07/2020 CLINICAL DATA:  44 year old female with cancer of unknown primary. Pancreatic mass noted on the CT of the abdomen pelvis dated 04/06/2020. EXAM: CT CHEST WITH CONTRAST TECHNIQUE: Multidetector CT imaging of the chest was performed during intravenous contrast administration. CONTRAST:  37mL OMNIPAQUE IOHEXOL 300 MG/ML  SOLN COMPARISON:  Chest CT dated 11/29/2017. FINDINGS: Cardiovascular: There is no cardiomegaly or pericardial effusion. The thoracic aorta is unremarkable. The origins of the great vessels of the aortic arch appear patent as visualized. Evaluation of the pulmonary arteries  is very limited due to suboptimal opacification and timing of the contrast. No definite large central pulmonary artery embolus identified. V/Q scan may provide better evaluation if there is high clinical concern for acute PE. Mediastinum/Nodes: No hilar or mediastinal adenopathy. The esophagus and the thyroid gland are grossly unremarkable. No mediastinal fluid collection. Lungs/Pleura: Large left pleural effusion with near complete compressive atelectasis of the left lower lobe and partial right upper lobe. Pneumonia is not excluded. Clinical correlation is recommended. The right lung is clear. There is no pneumothorax. There is slight shift of the mediastinum to the right of the midline. The central airways are patent. Upper Abdomen: Large heterogeneous upper abdominal mass with invasion of the spleen. Musculoskeletal: No acute osseous pathology. IMPRESSION: 1. Large left pleural effusion with near complete compressive atelectasis of the left lower lobe and partial right upper lobe. Pneumonia is not excluded. Clinical correlation is recommended. Consider thoracentesis for symptomatic relief. 2. Large heterogeneous upper abdominal mass with invasion of the spleen. Electronically Signed   By: Anner Crete M.D.   On: 04/07/2020 23:31   CT ABDOMEN PELVIS W CONTRAST  Result Date: 04/06/2020 CLINICAL DATA:  Periodically epigastric pain EXAM: CT ABDOMEN AND PELVIS WITH CONTRAST TECHNIQUE: Multidetector CT imaging of the abdomen and pelvis was performed using the standard protocol following bolus administration of intravenous contrast. CONTRAST:  14mL OMNIPAQUE IOHEXOL 300 MG/ML  SOLN COMPARISON:  None. FINDINGS: Lower chest: The visualized  heart size within normal limits. No pericardial fluid/thickening. No hiatal hernia. There is a small to moderate left pleural effusion with basilar atelectasis. Hepatobiliary: The liver is normal in density without focal abnormality.The main portal vein is patent. No evidence  of calcified gallstones, gallbladder wall thickening or biliary dilatation. Pancreas: There is a extensive heterogeneously enhancing mass involving nearly the entirety of the pancreatic body and tail. Only a small portion of the pancreatic head appears to be spared. The SMV and SMA appear to be patent. No areas of internal necrosis seen within the mass. The mass appears to extend into the splenic hilum. Spleen: Extensive large multiple heterogeneously enhancing hypodense masses seen throughout the splenic parenchyma likely from the extension of the pancreatic mass which extends through the splenic hilum. There is diffuse splenomegaly. The splenic vein appears to be narrowed wall entering the splenic hilum. Adrenals/Urinary Tract: Both adrenal glands appear normal. The kidneys and collecting system appear normal without evidence of urinary tract calculus or hydronephrosis. Bladder is unremarkable. Stomach/Bowel: There appears to be diffuse wall thickening seen within the proximal stomach with with the heterogeneous mass seen encompassing the proximal portion. The remainder of the small-bowel and colon are unremarkable. There is a moderate amount of colonic stool present. Vascular/Lymphatic: There is a left periaortic nodule/lymph node causing mild displacement of the third portion of the duodenum measuring 2.8 x 2.7 cm. Scattered small left-sided periaortic lymph nodes are also noted. Reproductive: Extensively enlarged uterus with multiple hypodense fibroids are seen throughout as on a prior MRI dating back to 2017. Small amount of fluid within the endometrial canal. Other: Small amount of abdominopelvic ascites is seen. Musculoskeletal: No acute or significant osseous findings. IMPRESSION: Findings consistent with an extensively enlarged heterogeneous mass involving nearly the entirety of the pancreas, likely consistent with primary pancreatic neoplasm or lymphoma There is extension into the splenic hilum with  diffuse splenic metastases. Retroperitoneal adenopathy, consistent with metastatic disease Diffuse wall thickening seen within the proximal stomach which could either be related to gastritis or possible metastatic disease. Small amount of abdominopelvic ascites Enlarged uterus with multiple uterine fibroids These results were called by telephone at the time of interpretation on 04/06/2020 at 5:19 am to provider [facility] , who verbally acknowledged these results. Electronically Signed   By: Prudencio Pair M.D.   On: 04/06/2020 05:19   CT BIOPSY  Result Date: 04/08/2020 INDICATION: 44 year old female with pancreatic mass versus peripancreatic adenopathy and associated involvement of the spleen. Findings are concerning for possible lymphoma. Patient presents for CT-guided core biopsy to obtain tissue diagnosis. EXAM: CT-guided core biopsy MEDICATIONS: None. ANESTHESIA/SEDATION: Moderate (conscious) sedation was employed during this procedure. A total of Versed 2 mg and Fentanyl 100 mcg was administered intravenously. Moderate Sedation Time: 12 minutes. The patient's level of consciousness and vital signs were monitored continuously by radiology nursing throughout the procedure under my direct supervision. FLUOROSCOPY TIME:  None. COMPLICATIONS: None immediate. PROCEDURE: Informed written consent was obtained from the patient after a thorough discussion of the procedural risks, benefits and alternatives. All questions were addressed. A timeout was performed prior to the initiation of the procedure. A planning axial CT scan was performed. The mass was identified. A suitable skin entry site was selected and marked. Local anesthesia was attained by infiltration with 1% lidocaine. A small dermatotomy was made. Under intermittent CT guidance, a 17 gauge introducer needle was carefully advanced into the margin of the mass. Multiple 18 gauge core biopsies were then obtained coaxially using the  bio Pince automated biopsy  device. Biopsy specimens were placed in saline and delivered to pathology for further analysis. Gel-Foam was injected through the introducer needle as it was removed. Post biopsy CT imaging demonstrates no evidence of hematoma or other immediate complication. The patient tolerated the procedure well. IMPRESSION: Technically successful CT-guided biopsy of pancreatic mass versus peripancreatic lymphadenopathy. Electronically Signed   By: Jacqulynn Cadet M.D.   On: 04/08/2020 16:32   US THORACENTESIS ASP PLEURAL SPACE W/IMG GUIDE  Result Date: 04/08/2020 INDICATION: Patient with history of HIV, pancreatic mass, retroperitoneal adenopathy, ascites, splenic masses, left pleural effusion. Request received for diagnostic and therapeutic left thoracentesis. EXAM: ULTRASOUND GUIDED DIAGNOSTIC AND THERAPEUTIC LEFT THORACENTESIS MEDICATIONS: None COMPLICATIONS: None immediate. PROCEDURE: An ultrasound guided thoracentesis was thoroughly discussed with the patient and questions answered. The benefits, risks, alternatives and complications were also discussed. The patient understands and wishes to proceed with the procedure. Written consent was obtained. Ultrasound was performed to localize and mark an adequate pocket of fluid in the left chest. The area was then prepped and draped in the normal sterile fashion. 1% Lidocaine was used for local anesthesia. Under ultrasound guidance a 6 Fr Safe-T-Centesis catheter was introduced. Thoracentesis was performed. The catheter was removed and a dressing applied. FINDINGS: A total of approximately 1.5 liters of blood-tinged fluid was removed. Samples were sent to the laboratory as requested by the clinical team. Due to patient chest discomfort and coughing only the above amount of fluid was removed today. IMPRESSION: Successful ultrasound guided diagnostic and therapeutic left thoracentesis yielding 1.5 liters of pleural fluid. Read by: Rowe Robert, PA-C Electronically Signed   By:  Jacqulynn Cadet M.D.   On: 04/08/2020 16:37        Subjective: Patient feeling better, abdominal pain has improved and patient is tolerating po well with no nausea or vomiting.   Discharge Exam: Vitals:   04/08/20 2116 04/09/20 0527  BP: 123/73 129/90  Pulse: (!) 102 (!) 106  Resp: 19 20  Temp: 98.6 F (37 C) 98.6 F (37 C)  SpO2: 99% 97%   Vitals:   04/08/20 1406 04/08/20 1634 04/08/20 2116 04/09/20 0527  BP: 120/87 (!) 131/96 123/73 129/90  Pulse: 97  (!) 102 (!) 106  Resp: 20  19 20   Temp: 98.6 F (37 C)  98.6 F (37 C) 98.6 F (37 C)  TempSrc: Oral  Oral Oral  SpO2: 97%  99% 97%  Weight:      Height:        General: Not in pain or dyspnea.  Neurology: Awake and alert, non focal  E ENT: no pallor, no icterus, oral mucosa moist Cardiovascular: No JVD. S1-S2 present, rhythmic, no gallops, rubs, or murmurs. No lower extremity edema. Pulmonary: positive breath sounds bilaterally, decreased breath sounds on the left base, with no wheezing, rhonchi or rales. Gastrointestinal. Abdomen with, no organomegaly, non tender, no rebound or guarding Skin. No rashes Musculoskeletal: no joint deformities   The results of significant diagnostics from this hospitalization (including imaging, microbiology, ancillary and laboratory) are listed below for reference.     Microbiology: Recent Results (from the past 240 hour(s))  SARS Coronavirus 2 by RT PCR (hospital order, performed in [facility] hospital lab) Nasopharyngeal Nasopharyngeal Swab     Status: None   Collection Time: 04/06/20  5:55 AM   Specimen: Nasopharyngeal Swab  Result Value Ref Range Status   SARS Coronavirus 2 NEGATIVE NEGATIVE Final    Comment: (NOTE) SARS-CoV-2 target nucleic acids  are NOT DETECTED. The SARS-CoV-2 RNA is generally detectable in upper and lower respiratory specimens during the acute phase of infection. The lowest concentration of SARS-CoV-2 viral copies this assay can detect is  250 copies / mL. A negative result does not preclude SARS-CoV-2 infection and should not be used as the sole basis for treatment or other patient management decisions.  A negative result may occur with improper specimen collection / handling, submission of specimen other than nasopharyngeal swab, presence of viral mutation(s) within the areas targeted by this assay, and inadequate number of viral copies (<250 copies / mL). A negative result must be combined with clinical observations, patient history, and epidemiological information. Fact Sheet for Patients:   StrictlyIdeas.no Fact Sheet for Healthcare Providers: BankingDealers.co.za This test is not yet approved or cleared  by the [place] FDA and has been authorized for detection and/or diagnosis of SARS-CoV-2 by FDA under an Emergency Use Authorization (EUA).  This EUA will remain in effect (meaning this test can be used) for the duration of the COVID-19 declaration under Section 564(b)(1) of the Act, 21 U.S.C. section 360bbb-3(b)(1), unless the authorization is terminated or revoked sooner. Performed at [facility], [address] [place], [place] [address]   Culture, body fluid-bottle     Status: None (Preliminary result)   Collection Time: 04/08/20  4:28 PM   Specimen: Fluid  Result Value Ref Range Status   Specimen Description FLUID PLEURAL  Final   Special Requests   Final    BOTTLES DRAWN AEROBIC AND ANAEROBIC Blood Culture adequate volume   Culture   Final    NO GROWTH < 12 HOURS Performed at [facility], [address], [place], [place] [address]    Report Status PENDING  Incomplete  Gram stain     Status: None   Collection Time: 04/08/20  4:28 PM   Specimen: Fluid  Result Value Ref Range Status   Specimen Description FLUID PLEURAL  Final   Special Requests NONE  Final   Gram Stain   Final    RARE WBC PRESENT, PREDOMINANTLY MONONUCLEAR NO  ORGANISMS SEEN Performed at [facility], [place] [address] [place], [place] [address]    Report Status 04/09/2020 FINAL  Final     Labs: BNP (last 3 results) No results for input(s): BNP in the last 8760 hours. Basic Metabolic Panel: Recent Labs  Lab 04/06/20 0350 04/07/20 0537  NA 137 135  K 3.9 4.0  CL 101 100  CO2 25 25  GLUCOSE 126* 120*  BUN 10 <5*  CREATININE 0.74 0.68  CALCIUM 9.4 9.1   Liver Function Tests: Recent Labs  Lab 04/06/20 0350 04/07/20 0537  AST 21 25  ALT 12 11  ALKPHOS 87 83  BILITOT 0.4 0.5  PROT 7.2 6.5  ALBUMIN 3.0* 2.9*   Recent Labs  Lab 04/06/20 0350 04/08/20 0516  LIPASE 123* 83*  AMYLASE  --  104*   No results for input(s): AMMONIA in the last 168 hours. CBC: Recent Labs  Lab 04/06/20 0350 04/07/20 0537 04/08/20 0516  WBC 7.0 7.2 7.5  NEUTROABS 5.1  --  6.1  HGB 9.3* 9.5* 9.1*  HCT 29.5* 30.4* 29.3*  MCV 67.4* 69.6* 69.3*  PLT 554* 496* 486*   Cardiac Enzymes: No results for input(s): CKTOTAL, CKMB, CKMBINDEX, TROPONINI in the last 168 hours. BNP: Invalid input(s): POCBNP CBG: No results for input(s): GLUCAP in the last 168 hours. D-Dimer No results for input(s):  DDIMER in the last 72 hours. Hgb A1c No results for input(s): HGBA1C in the last 72 hours. Lipid Profile No results for input(s): CHOL, HDL, LDLCALC, TRIG, CHOLHDL, LDLDIRECT in the last 72 hours. Thyroid function studies No results for input(s): TSH, T4TOTAL, T3FREE, THYROIDAB in the last 72 hours.  Invalid input(s): FREET3 Anemia work up Recent Labs    04/08/20 0516  FERRITIN 144  TIBC 312  IRON 15*  RETICCTPCT 1.6   Urinalysis    Component Value Date/Time   COLORURINE YELLOW 04/06/2020 [place] 04/06/2020 0350   LABSPEC 1.020 04/06/2020 0350   PHURINE 6.0 04/06/2020 0350   GLUCOSEU NEGATIVE 04/06/2020 0350   HGBUR MODERATE (A) 04/06/2020 0350   BILIRUBINUR NEGATIVE 04/06/2020 0350   KETONESUR NEGATIVE 04/06/2020  0350   PROTEINUR NEGATIVE 04/06/2020 0350   NITRITE NEGATIVE 04/06/2020 0350   LEUKOCYTESUR NEGATIVE 04/06/2020 0350   Sepsis Labs Invalid input(s): PROCALCITONIN,  WBC,  LACTICIDVEN Microbiology Recent Results (from the past 240 hour(s))  SARS Coronavirus 2 by RT PCR (hospital order, performed in [place] hospital lab) Nasopharyngeal Nasopharyngeal Swab     Status: None   Collection Time: 04/06/20  5:55 AM   Specimen: Nasopharyngeal Swab  Result Value Ref Range Status   SARS Coronavirus 2 NEGATIVE NEGATIVE Final    Comment: (NOTE) SARS-CoV-2 target nucleic acids are NOT DETECTED. The SARS-CoV-2 RNA is generally detectable in upper and lower respiratory specimens during the acute phase of infection. The lowest concentration of SARS-CoV-2 viral copies this assay can detect is 250 copies / mL. A negative result does not preclude SARS-CoV-2 infection and should not be used as the sole basis for treatment or other patient management decisions.  A negative result may occur with improper specimen collection / handling, submission of specimen other than nasopharyngeal swab, presence of viral mutation(s) within the areas targeted by this assay, and inadequate number of viral copies (<250 copies / mL). A negative result must be combined with clinical observations, patient history, and epidemiological information. Fact Sheet for Patients:   StrictlyIdeas.no Fact Sheet for Healthcare Providers: BankingDealers.co.za This test is not yet approved or cleared  by the [place] FDA and has been authorized for detection and/or diagnosis of SARS-CoV-2 by FDA under an Emergency Use Authorization (EUA).  This EUA will remain in effect (meaning this test can be used) for the duration of the COVID-19 declaration under Section 564(b)(1) of the Act, 21 U.S.C. section 360bbb-3(b)(1), unless the authorization is terminated or revoked sooner. Performed  at [facility], [address] [place], [place] [address]   Culture, body fluid-bottle     Status: None (Preliminary result)   Collection Time: 04/08/20  4:28 PM   Specimen: Fluid  Result Value Ref Range Status   Specimen Description FLUID PLEURAL  Final   Special Requests   Final    BOTTLES DRAWN AEROBIC AND ANAEROBIC Blood Culture adequate volume   Culture   Final    NO GROWTH < 12 HOURS Performed at [facility], [address] [place], [place] [address]    Report Status PENDING  Incomplete  Gram stain     Status: None   Collection Time: 04/08/20  4:28 PM   Specimen: Fluid  Result Value Ref Range Status   Specimen Description FLUID PLEURAL  Final   Special Requests NONE  Final   Gram Stain   Final    RARE WBC PRESENT, PREDOMINANTLY MONONUCLEAR NO ORGANISMS SEEN Performed  at [facility], [place] [address], [place], [place] [address]    Report Status 04/09/2020 FINAL  Final     Time coordinating discharge: 45 minutes  SIGNED:   Tawni Millers, MD  [facility] 04/09/2020, 11:29 AM

## 2020-04-11 ENCOUNTER — Telehealth: Payer: Self-pay | Admitting: Hematology

## 2020-04-11 LAB — HGB FRACTIONATION CASCADE
Hgb A2: 2.3 % (ref 1.8–3.2)
Hgb A: 97.7 % (ref 96.4–98.8)
Hgb F: 0 % (ref 0.0–2.0)
Hgb S: 0 %

## 2020-04-11 LAB — PH, BODY FLUID: pH, Body Fluid: 7.6

## 2020-04-11 NOTE — Telephone Encounter (Signed)
Received a staff msg from Dr. Irene Limbo to schedule Heather [name] for a hospital follow up appt on 6/8 at 3pm and labs at 2:30pm. Pt aware to arrive 15 minutes early.

## 2020-04-12 ENCOUNTER — Other Ambulatory Visit: Payer: Self-pay | Admitting: Hematology

## 2020-04-12 ENCOUNTER — Inpatient Hospital Stay: Payer: 59

## 2020-04-12 ENCOUNTER — Inpatient Hospital Stay: Payer: 59 | Attending: Hematology | Admitting: Hematology

## 2020-04-12 ENCOUNTER — Encounter: Payer: Self-pay | Admitting: Hematology

## 2020-04-12 ENCOUNTER — Other Ambulatory Visit: Payer: Self-pay

## 2020-04-12 VITALS — BP 142/91 | HR 96 | Temp 98.1°F | Resp 18 | Wt 129.5 lb

## 2020-04-12 DIAGNOSIS — C8338 Diffuse large B-cell lymphoma, lymph nodes of multiple sites: Secondary | ICD-10-CM

## 2020-04-12 DIAGNOSIS — Z5112 Encounter for antineoplastic immunotherapy: Secondary | ICD-10-CM | POA: Insufficient documentation

## 2020-04-12 DIAGNOSIS — Z5189 Encounter for other specified aftercare: Secondary | ICD-10-CM | POA: Diagnosis not present

## 2020-04-12 DIAGNOSIS — B181 Chronic viral hepatitis B without delta-agent: Secondary | ICD-10-CM | POA: Diagnosis not present

## 2020-04-12 DIAGNOSIS — C8330 Diffuse large B-cell lymphoma, unspecified site: Secondary | ICD-10-CM

## 2020-04-12 DIAGNOSIS — B20 Human immunodeficiency virus [HIV] disease: Secondary | ICD-10-CM

## 2020-04-12 DIAGNOSIS — D649 Anemia, unspecified: Secondary | ICD-10-CM | POA: Insufficient documentation

## 2020-04-12 DIAGNOSIS — D509 Iron deficiency anemia, unspecified: Secondary | ICD-10-CM | POA: Diagnosis not present

## 2020-04-12 DIAGNOSIS — Z7189 Other specified counseling: Secondary | ICD-10-CM

## 2020-04-12 DIAGNOSIS — Z20822 Contact with and (suspected) exposure to covid-19: Secondary | ICD-10-CM | POA: Diagnosis not present

## 2020-04-12 HISTORY — DX: Diffuse large b-cell lymphoma, lymph nodes of multiple sites: C83.38

## 2020-04-12 LAB — CBC WITH DIFFERENTIAL/PLATELET
Abs Immature Granulocytes: 0.13 10*3/uL — ABNORMAL HIGH (ref 0.00–0.07)
Basophils Absolute: 0 10*3/uL (ref 0.0–0.1)
Basophils Relative: 0 %
Eosinophils Absolute: 0 10*3/uL (ref 0.0–0.5)
Eosinophils Relative: 0 %
HCT: 32.9 % — ABNORMAL LOW (ref 36.0–46.0)
Hemoglobin: 10.3 g/dL — ABNORMAL LOW (ref 12.0–15.0)
Immature Granulocytes: 1 %
Lymphocytes Relative: 3 %
Lymphs Abs: 0.3 10*3/uL — ABNORMAL LOW (ref 0.7–4.0)
MCH: 21.7 pg — ABNORMAL LOW (ref 26.0–34.0)
MCHC: 31.3 g/dL (ref 30.0–36.0)
MCV: 69.3 fL — ABNORMAL LOW (ref 80.0–100.0)
Monocytes Absolute: 0.7 10*3/uL (ref 0.1–1.0)
Monocytes Relative: 6 %
Neutro Abs: 10 10*3/uL — ABNORMAL HIGH (ref 1.7–7.7)
Neutrophils Relative %: 90 %
Platelets: 667 10*3/uL — ABNORMAL HIGH (ref 150–400)
RBC: 4.75 MIL/uL (ref 3.87–5.11)
RDW: 17.9 % — ABNORMAL HIGH (ref 11.5–15.5)
WBC: 11.2 10*3/uL — ABNORMAL HIGH (ref 4.0–10.5)
nRBC: 0.4 % — ABNORMAL HIGH (ref 0.0–0.2)

## 2020-04-12 LAB — CMP (CANCER CENTER ONLY)
ALT: 13 U/L (ref 0–44)
AST: 28 U/L (ref 15–41)
Albumin: 3.1 g/dL — ABNORMAL LOW (ref 3.5–5.0)
Alkaline Phosphatase: 99 U/L (ref 38–126)
Anion gap: 13 (ref 5–15)
BUN: 14 mg/dL (ref 6–20)
CO2: 27 mmol/L (ref 22–32)
Calcium: 10.7 mg/dL — ABNORMAL HIGH (ref 8.9–10.3)
Chloride: 101 mmol/L (ref 98–111)
Creatinine: 0.85 mg/dL (ref 0.44–1.00)
GFR, Est AFR Am: >60 mL/min (ref >60)
GFR, Estimated: >60 mL/min (ref >60)
Glucose, Bld: 143 mg/dL — ABNORMAL HIGH (ref 70–99)
Potassium: 4.2 mmol/L (ref 3.5–5.1)
Sodium: 141 mmol/L (ref 135–145)
Total Bilirubin: 0.3 mg/dL (ref 0.3–1.2)
Total Protein: 7.5 g/dL (ref 6.5–8.1)

## 2020-04-12 LAB — CYTOLOGY - NON PAP

## 2020-04-12 LAB — LACTATE DEHYDROGENASE: LDH: 946 U/L — ABNORMAL HIGH (ref 98–192)

## 2020-04-12 LAB — SURGICAL PATHOLOGY

## 2020-04-12 MED ORDER — LIDOCAINE-PRILOCAINE 2.5-2.5 % EX CREA: TOPICAL_CREAM | CUTANEOUS | 3 refills | Status: AC

## 2020-04-12 NOTE — Progress Notes (Signed)
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 

## 2020-04-12 NOTE — Progress Notes (Signed)
HEMATOLOGY/ONCOLOGY CONSULTATION NOTE  Date of Service: 04/12/2020  Patient Care Team: Katherina Mires, MD as PCP - General (Family Medicine)  CHIEF COMPLAINTS/PURPOSE OF CONSULTATION:  Pancreatic mass with splenic metastases  HISTORY OF PRESENTING ILLNESS:  Heather [name] is a wonderful 44 y.o. female with a past medical history significant for hepatitis B, HIV, chronic anemia who presented to [facility] with abdominal pain.  She had a 2-week history of early satiety, poor appetite and, 5 pound weight loss.  Abdominal pain is typically postprandial and mainly at night after dinner while laying in bed.  Has been associated with nausea improves after vomiting.  Labs on admission showed a hemoglobin of 9.3, MCV 67.4, platelet count 554,000.  Lipase was mildly elevated at 123.  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  Husband was at the bedside at the time of the visit.  The patient reports that she is having ongoing abdominal discomfort today.  Pain is primarily in her epigastric area and radiates to the left side of her abdomen.  States pain is worse when laying down after she eats.  She states that she has had this pain for at least 2 weeks.  Her abdominal pain is worse after eating.  She has had some nausea and intermittent vomiting.  Vomiting makes her pain better.  She was attributing her symptoms to some the medications that she was taking for fertility treatments.  She reports a poor appetite and about a 5 pound weight loss.  She is not having any fevers  or chills.  Denies night sweats.  She has not noticed any palpable lymphadenopathy.  She denies headaches, dizziness, chest pain, shortness of breath, diarrhea.  She reports intermittent constipation.  Denies bleeding or lower extremity edema.  The patient is married and has no children.  She currently works as an Therapist, sports.  Denies alcohol tobacco use.  Family history significant for paternal grandmother with uterine cancer.  Medical oncology was asked see the patient to make recommendations regarding her abnormal CT scan findings.  INTERVAL HISTORY:  Heather [name] is a 44 year old female who is here for evaluation and management of Pancreatic mass with splenic metastases. The patient was last seen in the hospital. She is joined today by her husband. The pt reports that she is doing well overall.  The pt reports that she has noticed an improvement in breathing since beginning steroids. Pt has been walking with her partner daily and only has a slight cough afterwards. She has no history of neuropathy.   Of note since the patient's last visit, pt has had Pancreatic Mass Surgical Pathology Report [id number]) completed on 04/08/2020 with results revealing "Diffuse large B-cell lymphoma".  Lab results today (04/12/20) of CBC w/diff and CMP is as follows: all values are WNL except for WBC at 11.2K, Hgb at 10.3, HCT at 32.9, MCV at 69.3, MCH at 21.7, RDW at 17.9, PLT at 667K, nRBC at 0.4, Neutro Abs at 10.0K, Lymphs Abs at 0.3K, Abs Immature Granulocytes at 0.13K, Glucose at 143, Calcium at 10.7, Albumin at 3.1. 04/12/2020 LDH at 946  On review of systems, pt reports bloating and  denies SOB, nausea, vomiting, abdominal pain, numbness/tingling/burning in hands/feet and any other symptoms.   MEDICAL HISTORY:  Past Medical History:  Diagnosis Date  . Anemia   . 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 History of blood transfusion    childhood  . HIV (human immunodeficiency virus infection) ([place])   .  Immune deficiency disorder [facility])     SURGICAL HISTORY: Past Surgical History:  Procedure Laterality Date  . CHROMOPERTUBATION Bilateral 11/29/2016   Procedure: CHROMOPERTUBATION;  Surgeon: Waymon Amato, MD;  Location: [facility];  Service: Gynecology;  Laterality: Bilateral;  fallopian tubes  . ENDOMETRIAL BIOPSY    . MYOMECTOMY N/A 11/29/2016   Procedure: MYOMECTOMY;  Surgeon: Waymon Amato, MD;  Location: [facility];  Service: Gynecology;  Laterality: N/A;    SOCIAL HISTORY: Social History   Socioeconomic History  . Marital status: Married    Spouse name: Not on file  . Number of children: Not on file  . Years of education: Not on file  . Highest education level: Not on file  Occupational History  . Not on file  Tobacco Use  . Smoking status: Never Smoker  . Smokeless tobacco: Never Used  Substance and Sexual Activity  . Alcohol use: Yes    Comment: occ  . Drug use: No  . Sexual activity: Yes  Other Topics Concern  . Not on file  Social History Narrative  . Not on file   Social Determinants of Health   Financial Resource Strain:   . Difficulty of Paying Living Expenses:   Food Insecurity:   . Worried About Charity fundraiser in the Last Year:   . Arboriculturist in the Last Year:   Transportation Needs:   . Film/video editor (Medical):   Marland Kitchen Lack of Transportation (Non-Medical):   Physical Activity:   . Days of Exercise per Week:   . Minutes of Exercise per Session:   Stress:   . Feeling of Stress :   Social Connections:   . Frequency of Communication with Friends and Family:   . Frequency of Social Gatherings with Friends and Family:   . Attends Religious Services:   . Active Member of Clubs or Organizations:   . Attends Archivist Meetings:   Marland Kitchen Marital Status:   Intimate Partner Violence:   . Fear of Current or Ex-Partner:   . Emotionally Abused:   Marland Kitchen Physically Abused:   . Sexually Abused:     FAMILY HISTORY: Family History  Problem Relation Age of  Onset  . Diabetes Mother   . Hypertension Mother     ALLERGIES:  has No Known Allergies.  MEDICATIONS:  Current Outpatient Medications  Medication Sig Dispense Refill  . bictegravir-emtricitabine-tenofovir AF (BIKTARVY) 50-200-25 MG TABS tablet Take 1 tablet by mouth daily.    . cholecalciferol (VITAMIN D3) 25 MCG (1000 UNIT) tablet Take 1,000 Units by mouth daily.    . Multiple Vitamin (MULTIVITAMIN WITH MINERALS) TABS tablet Take 1 tablet by mouth daily.    . pantoprazole (PROTONIX) 40 MG tablet Take 1 tablet (40 mg total) by mouth daily for 14 days. 14 tablet 0  . predniSONE (DELTASONE) 20 MG tablet Take 3 tablets (60 mg total) by mouth daily with breakfast for 14 days. 42 tablet 0   No current facility-administered medications for this visit.    REVIEW OF SYSTEMS:    10 Point review of Systems was done is negative except as noted above.  PHYSICAL EXAMINATION: ECOG PERFORMANCE STATUS: 1 - Symptomatic but completely ambulatory  . Vitals:   04/12/20 1503  BP: (!) 142/91  Pulse: 96  Resp: 18  Temp: 98.1 F (36.7 C)  SpO2: 100%   Filed Weights   04/12/20 1503  Weight: 129 lb 8 oz (58.7 kg)   .Body mass index is 25.29 kg/m.  GENERAL:alert, in no acute distress and comfortable SKIN: no acute rashes, no significant lesions EYES: conjunctiva are pink and non-injected, sclera anicteric OROPHARYNX: MMM, no exudates, no oropharyngeal erythema or ulceration NECK: supple, no JVD LYMPH:  no palpable lymphadenopathy in the cervical, axillary or inguinal regions LUNGS: clear to auscultation b/l with normal respiratory effort HEART: regular rate & rhythm ABDOMEN:  normoactive bowel sounds , non tender, not distended. Extremity: no pedal edema PSYCH: alert & oriented x 3 with fluent speech NEURO: no focal motor/sensory deficits  LABORATORY DATA:  I have reviewed the data as listed  . CBC Latest Ref Rng & Units 04/12/2020 04/08/2020 04/07/2020  WBC 4.0 - 10.5 K/uL 11.2(H) 7.5  7.2  Hemoglobin 12.0 - 15.0 g/dL 10.3(L) 9.1(L) 9.5(L)  Hematocrit 36.0 - 46.0 % 32.9(L) 29.3(L) 30.4(L)  Platelets 150 - 400 K/uL 667(H) 486(H) 496(H)    . CMP Latest Ref Rng & Units 04/07/2020 04/06/2020 11/29/2017  Glucose 70 - 99 mg/dL 120(H) 126(H) 101(H)  BUN 6 - 20 mg/dL <5(L) 10 8  Creatinine 0.44 - 1.00 mg/dL 0.68 0.74 0.68  Sodium 135 - 145 mmol/L 135 137 134(L)  Potassium 3.5 - 5.1 mmol/L 4.0 3.9 3.7  Chloride 98 - 111 mmol/L 100 101 104  CO2 22 - 32 mmol/L 25 25 24   Calcium 8.9 - 10.3 mg/dL 9.1 9.4 8.9  Total Protein 6.5 - 8.1 g/dL 6.5 7.2 9.0(H)  Total Bilirubin 0.3 - 1.2 mg/dL 0.5 0.4 0.4  Alkaline Phos 38 - 126 U/L 83 87 55  AST 15 - 41 U/L 25 21 29   ALT 0 - 44 U/L 11 12 19    04/08/2020 Pancreatic Mass Surgical Pathology Report [id number]):     RADIOGRAPHIC STUDIES: I have personally reviewed the radiological images as listed and agreed with the findings in the report. DG Chest 1 View  Result Date: 04/08/2020 CLINICAL DATA:  Post thoracentesis EXAM: CHEST  1 VIEW COMPARISON:  CT 04/07/2020 FINDINGS: Moderate to large left pleural effusion remains following thoracentesis. No pneumothorax. Right lung clear. Heart is normal size. No effusions. IMPRESSION: Moderate to large left effusion with left base atelectasis. No pneumothorax. Electronically Signed   By: Rolm Baptise M.D.   On: 04/08/2020 16:55   CT CHEST W CONTRAST  Result Date: 04/07/2020 CLINICAL DATA:  44 year old female with cancer of unknown primary. Pancreatic mass noted on the CT of the abdomen pelvis dated 04/06/2020. EXAM: CT CHEST WITH CONTRAST TECHNIQUE: Multidetector CT imaging of the chest was performed during intravenous contrast administration. CONTRAST:  70mL OMNIPAQUE IOHEXOL 300 MG/ML  SOLN COMPARISON:  Chest CT dated 11/29/2017. FINDINGS: Cardiovascular: There is no cardiomegaly or pericardial effusion. The thoracic aorta is unremarkable. The origins of the great vessels of the aortic arch appear  patent as visualized. Evaluation of the pulmonary arteries is very limited due to suboptimal opacification and timing of the contrast. No definite large central pulmonary artery embolus identified. V/Q scan may provide better evaluation if there is high clinical concern for acute PE. Mediastinum/Nodes: No hilar or mediastinal adenopathy. The esophagus and the thyroid gland are grossly unremarkable. No mediastinal fluid collection. Lungs/Pleura: Large left  pleural effusion with near complete compressive atelectasis of the left lower lobe and partial right upper lobe. Pneumonia is not excluded. Clinical correlation is recommended. The right lung is clear. There is no pneumothorax. There is slight shift of the mediastinum to the right of the midline. The central airways are patent. Upper Abdomen: Large heterogeneous upper abdominal mass with invasion of the spleen. Musculoskeletal: No acute osseous pathology. IMPRESSION: 1. Large left pleural effusion with near complete compressive atelectasis of the left lower lobe and partial right upper lobe. Pneumonia is not excluded. Clinical correlation is recommended. Consider thoracentesis for symptomatic relief. 2. Large heterogeneous upper abdominal mass with invasion of the spleen. Electronically Signed   By: Anner Crete M.D.   On: 04/07/2020 23:31   CT ABDOMEN PELVIS W CONTRAST  Result Date: 04/06/2020 CLINICAL DATA:  Periodically epigastric pain EXAM: CT ABDOMEN AND PELVIS WITH CONTRAST TECHNIQUE: Multidetector CT imaging of the abdomen and pelvis was performed using the standard protocol following bolus administration of intravenous contrast. CONTRAST:  148mL OMNIPAQUE IOHEXOL 300 MG/ML  SOLN COMPARISON:  None. FINDINGS: Lower chest: The visualized heart size within normal limits. No pericardial fluid/thickening. No hiatal hernia. There is a small to moderate left pleural effusion with basilar atelectasis. Hepatobiliary: The liver is normal in density without  focal abnormality.The main portal vein is patent. No evidence of calcified gallstones, gallbladder wall thickening or biliary dilatation. Pancreas: There is a extensive heterogeneously enhancing mass involving nearly the entirety of the pancreatic body and tail. Only a small portion of the pancreatic head appears to be spared. The SMV and SMA appear to be patent. No areas of internal necrosis seen within the mass. The mass appears to extend into the splenic hilum. Spleen: Extensive large multiple heterogeneously enhancing hypodense masses seen throughout the splenic parenchyma likely from the extension of the pancreatic mass which extends through the splenic hilum. There is diffuse splenomegaly. The splenic vein appears to be narrowed wall entering the splenic hilum. Adrenals/Urinary Tract: Both adrenal glands appear normal. The kidneys and collecting system appear normal without evidence of urinary tract calculus or hydronephrosis. Bladder is unremarkable. Stomach/Bowel: There appears to be diffuse wall thickening seen within the proximal stomach with with the heterogeneous mass seen encompassing the proximal portion. The remainder of the small-bowel and colon are unremarkable. There is a moderate amount of colonic stool present. Vascular/Lymphatic: There is a left periaortic nodule/lymph node causing mild displacement of the third portion of the duodenum measuring 2.8 x 2.7 cm. Scattered small left-sided periaortic lymph nodes are also noted. Reproductive: Extensively enlarged uterus with multiple hypodense fibroids are seen throughout as on a prior MRI dating back to 2017. Small amount of fluid within the endometrial canal. Other: Small amount of abdominopelvic ascites is seen. Musculoskeletal: No acute or significant osseous findings. IMPRESSION: Findings consistent with an extensively enlarged heterogeneous mass involving nearly the entirety of the pancreas, likely consistent with primary pancreatic neoplasm or  lymphoma There is extension into the splenic hilum with diffuse splenic metastases. Retroperitoneal adenopathy, consistent with metastatic disease Diffuse wall thickening seen within the proximal stomach which could either be related to gastritis or possible metastatic disease. Small amount of abdominopelvic ascites Enlarged uterus with multiple uterine fibroids These results were called by telephone at the time of interpretation on 04/06/2020 at 5:19 am to provider [facility] , who verbally acknowledged these results. Electronically Signed   By: Prudencio Pair M.D.   On: 04/06/2020 05:19   CT BIOPSY  Result Date: 04/08/2020  INDICATION: 44 year old female with pancreatic mass versus peripancreatic adenopathy and associated involvement of the spleen. Findings are concerning for possible lymphoma. Patient presents for CT-guided core biopsy to obtain tissue diagnosis. EXAM: CT-guided core biopsy MEDICATIONS: None. ANESTHESIA/SEDATION: Moderate (conscious) sedation was employed during this procedure. A total of Versed 2 mg and Fentanyl 100 mcg was administered intravenously. Moderate Sedation Time: 12 minutes. The patient's level of consciousness and vital signs were monitored continuously by radiology nursing throughout the procedure under my direct supervision. FLUOROSCOPY TIME:  None. COMPLICATIONS: None immediate. PROCEDURE: Informed written consent was obtained from the patient after a thorough discussion of the procedural risks, benefits and alternatives. All questions were addressed. A timeout was performed prior to the initiation of the procedure. A planning axial CT scan was performed. The mass was identified. A suitable skin entry site was selected and marked. Local anesthesia was attained by infiltration with 1% lidocaine. A small dermatotomy was made. Under intermittent CT guidance, a 17 gauge introducer needle was carefully advanced into the margin of the mass. Multiple 18 gauge core biopsies were then  obtained coaxially using the bio Pince automated biopsy device. Biopsy specimens were placed in saline and delivered to pathology for further analysis. Gel-Foam was injected through the introducer needle as it was removed. Post biopsy CT imaging demonstrates no evidence of hematoma or other immediate complication. The patient tolerated the procedure well. IMPRESSION: Technically successful CT-guided biopsy of pancreatic mass versus peripancreatic lymphadenopathy. Electronically Signed   By: Jacqulynn Cadet M.D.   On: 04/08/2020 16:32   US THORACENTESIS ASP PLEURAL SPACE W/IMG GUIDE  Result Date: 04/08/2020 INDICATION: Patient with history of HIV, pancreatic mass, retroperitoneal adenopathy, ascites, splenic masses, left pleural effusion. Request received for diagnostic and therapeutic left thoracentesis. EXAM: ULTRASOUND GUIDED DIAGNOSTIC AND THERAPEUTIC LEFT THORACENTESIS MEDICATIONS: None COMPLICATIONS: None immediate. PROCEDURE: An ultrasound guided thoracentesis was thoroughly discussed with the patient and questions answered. The benefits, risks, alternatives and complications were also discussed. The patient understands and wishes to proceed with the procedure. Written consent was obtained. Ultrasound was performed to localize and mark an adequate pocket of fluid in the left chest. The area was then prepped and draped in the normal sterile fashion. 1% Lidocaine was used for local anesthesia. Under ultrasound guidance a 6 Fr Safe-T-Centesis catheter was introduced. Thoracentesis was performed. The catheter was removed and a dressing applied. FINDINGS: A total of approximately 1.5 liters of blood-tinged fluid was removed. Samples were sent to the laboratory as requested by the clinical team. Due to patient chest discomfort and coughing only the above amount of fluid was removed today. IMPRESSION: Successful ultrasound guided diagnostic and therapeutic left thoracentesis yielding 1.5 liters of pleural fluid.  Read by: Rowe Robert, PA-C Electronically Signed   By: Jacqulynn Cadet M.D.   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 Hemoglobin C], childhood malaria. Patient notes that she had a CT of the abdomen sometime in 2020 that showed no acute pathology.This was not [facility]. Not accessible to [place] at this point. She did have an MRI of the abdomen in July 2019 which showed indeterminate splenic lesions.No other acute abdominal pathology.No concern with hepatocellular carcinoma.  Patient is presenting now with  #1Pancreatic mass along with retroperitoneal lymphadenopathy  and diffuse splenic lesions. No internal necrosis within the mass noted. Diffuse splenomegaly. Left periaortic lymph node causing mild displacement of the third portion of the duodenum.  Significantly elevated LDH levels. CA 19-9 and CEA levels unrevealing  # 2 Left sided large pleural effusion and lung atelectasis. S/p diagnostic and therapeutic thoracentesis -- lymphocytic predominance @ 80%  Overall picture concerning for possible  High grade B cell lymphoma vs primary pancreatic malignancy (though CA 19-9 levels indeterminate and not significantly elevated.  HIV could be risk factors for high grade EBV driven lymphomas  Patient symptomatology has developed rather quickly over the last few weeks.  #3 microcytic anemia-chronic.Some element of iron deficiency versus hemoglobinopathy versus anemia of chronic disease related to malignancy. Patient has received 1 dose of IV Feraheme Will rpt 2nd dose in 1 week.  #4 history of HIV/AIDS follows with Dr. Melynda Keller and at [facility].   PLAN: -Discussed pt labwork today, 04/12/20; WBC & PLT are elevated, Hgb has improved, LDH is very elevated  -Discussed 04/08/2020 Pancreatic Mass Surgical Pathology Report [id number]) which revealed "Diffuse large B-cell lymphoma". -Advised pt that she has DLBCL which is a type of Non-Hodgkin's Lymphoma with an unusual presentation  -Advised pt that most Non-Hodgkin's Lymphomas are indolent, but her disease appears to be more aggressive than normal.  -Currently waiting on results from molecular pathology for risk stratification.  -Advised pt that she has Stage 4 disease, based on involvement in pleural space. Would still treat to cure.  -Advised pt that standard treatment would be R-CHOP, but we would prefer to give EPOCH-R due to more aggressive disease.   -Advised pt that we would complete up to 6 cycles and repeat scans after the third cycle  -Will coordinate cares with her Infectious Disease Physician to prevent any drug interactions with Biktarvy -Will be cautious with Rituxan to prevent triggering Hepatitis B flare. Hep B AL repeated -- undetectable. -Pt was last "Undetectable" for HBV in 11/20. Will repeat testing VL still undetectable. -Will consider Intrathecal Methotrexate for CNS prophylaxis from C2 -Discussed fertility preservation - pt declines at this time -Will get ECHO for baseline  -Will set up chemo counseling ASAP -Will get a Port-a-cath placement in ASAP -Would like to get PET/CT to complete w/o and create baseline   FOLLOW UP: Labs tommorrow PET/CT- stat ECHO -stat Port a cath- urgently Chemo-counseling for EPOCH-R ASAP in 2-3 days Inpatient admission to start C1 of EPOCH from 04/18/2020 for 5 days. Outpatient Rituxan and Udenyca on 04/25/2020 Labs and MD visit for toxicity check on 04/28/2020   All of the patients questions were answered with apparent satisfaction. The patient knows to call the clinic with any problems, questions or concerns.  I  spent 30 mins counseling the patient face to face. The total time spent in the appointment was 40 minutes and more than 50% was on counseling and direct patient cares.    Sullivan Lone MD [place] AAHIVMS [facility] Hematology/Oncology Physician [facility]  (Office):       [phone] (Work cell):  [phone] (Fax):           [phone]  04/12/2020 9:51 AM  I, Yevette Edwards, am acting as a scribe for Dr. Sullivan Lone.   .I have reviewed the above documentation for accuracy and completeness, and I agree with the above. Brunetta Genera MD

## 2020-04-13 ENCOUNTER — Inpatient Hospital Stay: Payer: 59

## 2020-04-13 DIAGNOSIS — B181 Chronic viral hepatitis B without delta-agent: Secondary | ICD-10-CM

## 2020-04-13 DIAGNOSIS — Z5112 Encounter for antineoplastic immunotherapy: Secondary | ICD-10-CM | POA: Diagnosis not present

## 2020-04-13 LAB — CULTURE, BODY FLUID W GRAM STAIN -BOTTLE
Culture: NO GROWTH
Special Requests: ADEQUATE

## 2020-04-13 LAB — SURGICAL PATHOLOGY

## 2020-04-14 ENCOUNTER — Other Ambulatory Visit: Payer: Self-pay | Admitting: Student

## 2020-04-14 ENCOUNTER — Encounter: Payer: Self-pay | Admitting: Hematology

## 2020-04-14 ENCOUNTER — Other Ambulatory Visit: Payer: Self-pay

## 2020-04-14 ENCOUNTER — Inpatient Hospital Stay: Payer: 59

## 2020-04-14 ENCOUNTER — Ambulatory Visit (HOSPITAL_COMMUNITY)
Admission: RE | Admit: 2020-04-14 | Discharge: 2020-04-14 | Disposition: A | Discharge status: Home / Self Care | Payer: 59 | Source: Ambulatory Visit | Attending: Medical | Admitting: Medical

## 2020-04-14 ENCOUNTER — Inpatient Hospital Stay (HOSPITAL_BASED_OUTPATIENT_CLINIC_OR_DEPARTMENT_OTHER): Payer: 59 | Admitting: Medical

## 2020-04-14 ENCOUNTER — Other Ambulatory Visit: Payer: Self-pay | Admitting: Medical

## 2020-04-14 ENCOUNTER — Other Ambulatory Visit: Payer: Self-pay | Admitting: Hematology

## 2020-04-14 VITALS — BP 143/92 | HR 104 | Temp 97.9°F | Resp 18 | Ht 60.0 in | Wt 127.0 lb

## 2020-04-14 DIAGNOSIS — Z79899 Other long term (current) drug therapy: Secondary | ICD-10-CM | POA: Diagnosis not present

## 2020-04-14 DIAGNOSIS — J90 Pleural effusion, not elsewhere classified: Secondary | ICD-10-CM | POA: Insufficient documentation

## 2020-04-14 DIAGNOSIS — Z20822 Contact with and (suspected) exposure to covid-19: Secondary | ICD-10-CM | POA: Insufficient documentation

## 2020-04-14 DIAGNOSIS — R1900 Intra-abdominal and pelvic swelling, mass and lump, unspecified site: Secondary | ICD-10-CM

## 2020-04-14 DIAGNOSIS — Z21 Asymptomatic human immunodeficiency virus [HIV] infection status: Secondary | ICD-10-CM | POA: Diagnosis not present

## 2020-04-14 DIAGNOSIS — C8330 Diffuse large B-cell lymphoma, unspecified site: Secondary | ICD-10-CM | POA: Diagnosis present

## 2020-04-14 DIAGNOSIS — R0602 Shortness of breath: Secondary | ICD-10-CM

## 2020-04-14 DIAGNOSIS — C8338 Diffuse large B-cell lymphoma, lymph nodes of multiple sites: Secondary | ICD-10-CM

## 2020-04-14 LAB — SARS CORONAVIRUS 2 (TAT 6-24 HRS): SARS Coronavirus 2: NEGATIVE

## 2020-04-14 NOTE — Telephone Encounter (Signed)
Dr.Kale informed of patient's message. Dr. Irene Limbo asked that patient be seen today in [facility] [place]. Mr. Satira Sark, [place] in [facility] wa informed of patient info/concerns - ordered xrays prior to [facility] appt.  Contacted patient with this information: X rays ordered for her today followed by appt in [facility] at 2pm. Patient verbalized understanding.

## 2020-04-14 NOTE — Progress Notes (Signed)
Pt seen by PA Van only, no assessment by [facility] RN at this time (time constraints).  PA Van aware. 

## 2020-04-15 ENCOUNTER — Ambulatory Visit (HOSPITAL_COMMUNITY)
Admission: RE | Admit: 2020-04-15 | Discharge: 2020-04-15 | Disposition: A | Discharge status: Home / Self Care | Payer: 59 | Source: Ambulatory Visit | Attending: Hematology | Admitting: Hematology

## 2020-04-15 ENCOUNTER — Ambulatory Visit (HOSPITAL_COMMUNITY)
Admission: RE | Admit: 2020-04-15 | Discharge: 2020-04-15 | Disposition: A | Discharge status: Home / Self Care | Payer: 59 | Source: Ambulatory Visit | Attending: Medical | Admitting: Medical

## 2020-04-15 ENCOUNTER — Inpatient Hospital Stay: Payer: 59

## 2020-04-15 ENCOUNTER — Telehealth: Payer: Self-pay | Admitting: *Deleted

## 2020-04-15 DIAGNOSIS — C8338 Diffuse large B-cell lymphoma, lymph nodes of multiple sites: Secondary | ICD-10-CM

## 2020-04-15 DIAGNOSIS — C8330 Diffuse large B-cell lymphoma, unspecified site: Secondary | ICD-10-CM | POA: Diagnosis not present

## 2020-04-15 HISTORY — PX: IR THORACENTESIS ASP PLEURAL SPACE W/IMG GUIDE: IMG5380

## 2020-04-15 HISTORY — PX: IR IMAGING GUIDED PORT INSERTION: IMG5740

## 2020-04-15 LAB — HEPATITIS B DNA, ULTRAQUANTITATIVE, PCR
HBV DNA SERPL PCR-ACNC: <10 IU/mL
HBV DNA SERPL PCR-LOG IU: UNDETERMINED log10 IU/mL

## 2020-04-15 LAB — GLUCOSE, CAPILLARY: Glucose-Capillary: 101 mg/dL — ABNORMAL HIGH (ref 70–99)

## 2020-04-15 MED ORDER — LIDOCAINE-EPINEPHRINE 1 %-1:100000 IJ SOLN
INTRAMUSCULAR | Status: AC
  Filled 2020-04-15: qty 1

## 2020-04-15 MED ORDER — CEFAZOLIN SODIUM-DEXTROSE 2-4 GM/100ML-% IV SOLN
INTRAVENOUS | Status: AC
  Filled 2020-04-15: qty 100

## 2020-04-15 MED ORDER — MIDAZOLAM HCL 2 MG/2ML IJ SOLN
INTRAMUSCULAR | Status: AC | PRN
  Administered 2020-04-15 (×4): 1 mg via INTRAVENOUS

## 2020-04-15 MED ORDER — MIDAZOLAM HCL 2 MG/2ML IJ SOLN
INTRAMUSCULAR | Status: AC
  Filled 2020-04-15: qty 4

## 2020-04-15 MED ORDER — SODIUM CHLORIDE 0.9 % IV SOLN: INTRAVENOUS | Status: DC

## 2020-04-15 MED ORDER — CEFAZOLIN SODIUM-DEXTROSE 2-4 GM/100ML-% IV SOLN
2.0000 g | Freq: Once | INTRAVENOUS | Status: AC
  Administered 2020-04-15: 2 g via INTRAVENOUS

## 2020-04-15 MED ORDER — FENTANYL CITRATE (PF) 100 MCG/2ML IJ SOLN
INTRAMUSCULAR | Status: AC | PRN
  Administered 2020-04-15 (×2): 50 ug via INTRAVENOUS

## 2020-04-15 MED ORDER — FLUDEOXYGLUCOSE F - 18 (FDG) INJECTION
6.4400 | Freq: Once | INTRAVENOUS | Status: AC | PRN
  Administered 2020-04-15: 6.44 via INTRAVENOUS

## 2020-04-15 MED ORDER — LIDOCAINE-EPINEPHRINE 1 %-1:100000 IJ SOLN
INTRAMUSCULAR | Status: AC | PRN
  Administered 2020-04-15: 10 mL via INTRADERMAL

## 2020-04-15 MED ORDER — FENTANYL CITRATE (PF) 100 MCG/2ML IJ SOLN
INTRAMUSCULAR | Status: AC
  Filled 2020-04-15: qty 2

## 2020-04-15 MED ORDER — LIDOCAINE HCL 1 % IJ SOLN
INTRAMUSCULAR | Status: AC
  Filled 2020-04-15: qty 20

## 2020-04-15 MED ORDER — HEPARIN SOD (PORK) LOCK FLUSH 100 UNIT/ML IV SOLN
INTRAVENOUS | Status: AC
  Filled 2020-04-15: qty 5

## 2020-04-15 NOTE — Sedation Documentation (Signed)
Thoracentesis completed. Patient prepped for port a cath procedure.

## 2020-04-15 NOTE — Procedures (Signed)
Pre Procedure Dx: Lymphoma, pleural effusion Post Procedural Dx: Same  Successful US guided left sided thoracentesis yielding 200 cc of dark orange colored fluid.   Successful placement of right IJ approach port-a-cath with tip at the superior caval atrial junction. The catheter is ready for immediate use.  Estimated Blood Loss: Minimal Complications: None immediate.  Ronny Bacon, MD Pager #: [phone]

## 2020-04-15 NOTE — Sedation Documentation (Signed)
Thoracentesis begun by Dr Pascal Lux.

## 2020-04-15 NOTE — Discharge Instructions (Addendum)
Moderate Conscious Sedation, Adult, Care After These instructions provide you with information about caring for yourself after your procedure. Your health care provider may also give you more specific instructions. Your treatment has been planned according to current medical practices, but problems sometimes occur. Call your health care provider if you have any problems or questions after your procedure. What can I expect after the procedure? After your procedure, it is common:  To feel sleepy for several hours.  To feel clumsy and have poor balance for several hours.  To have poor judgment for several hours.  To vomit if you eat too soon. Follow these instructions at home: For at least 24 hours after the procedure:   Do not: ? Participate in activities where you could fall or become injured. ? Drive. ? Use heavy machinery. ? Drink alcohol. ? Take sleeping pills or medicines that cause drowsiness. ? Make important decisions or sign legal documents. ? Take care of children on your own.  Rest. Eating and drinking  Follow the diet recommended by your health care provider.  If you vomit: ? Drink water, juice, or soup when you can drink without vomiting. ? Make sure you have little or no nausea before eating solid foods. General instructions  Have a responsible adult stay with you until you are awake and alert.  Take over-the-counter and prescription medicines only as told by your health care provider.  If you smoke, do not smoke without supervision.  Keep all follow-up visits as told by your health care provider. This is important. Contact a health care provider if:  You keep feeling nauseous or you keep vomiting.  You feel light-headed.  You develop a rash.  You have a fever. Get help right away if:  You have trouble breathing. This information is not intended to replace advice given to you by your health care provider. Make sure you discuss any questions you have  with your health care provider. Document Revised: 10/04/2017 Document Reviewed: 02/11/2016 Elsevier Patient Education  2020 [employer] Implanted Port Home Guide An implanted port is a device that is placed under the skin. It is usually placed in the chest. The device can be used to give IV medicine, to take blood, or for dialysis. You may have an implanted port if:  You need IV medicine that would be irritating to the small veins in your hands or arms.  You need IV medicines, such as antibiotics, for a long period of time.  You need IV nutrition for a long period of time.  You need dialysis. Having a port means that your health care provider will not need to use the veins in your arms for these procedures. You may have fewer limitations when using a port than you would if you used other types of long-term IVs, and you will likely be able to return to normal activities after your incision heals. An implanted port has two main parts:  Reservoir. The reservoir is the part where a needle is inserted to give medicines or draw blood. The reservoir is round. After it is placed, it appears as a small, raised area under your skin.  Catheter. The catheter is a thin, flexible tube that connects the reservoir to a vein. Medicine that is inserted into the reservoir goes into the catheter and then into the vein. How is my port accessed? To access your port:  A numbing cream may be placed on the skin over the port site.  Your health care provider   will put on a mask and sterile gloves.  The skin over your port will be cleaned carefully with a germ-killing soap and allowed to dry.  Your health care provider will gently pinch the port and insert a needle into it.  Your health care provider will check for a blood return to make sure the port is in the vein and is not clogged.  If your port needs to remain accessed to get medicine continuously (constant infusion), your health care provider will place  a clear bandage (dressing) over the needle site. The dressing and needle will need to be changed every week, or as told by your health care provider. What is flushing? Flushing helps keep the port from getting clogged. Follow instructions from your health care provider about how and when to flush the port. Ports are usually flushed with saline solution or a medicine called heparin. The need for flushing will depend on how the port is used:  If the port is only used from time to time to give medicines or draw blood, the port may need to be flushed: ? Before and after medicines have been given. ? Before and after blood has been drawn. ? As part of routine maintenance. Flushing may be recommended every 4-6 weeks.  If a constant infusion is running, the port may not need to be flushed.  Throw away any syringes in a disposal container that is meant for sharp items (sharps container). You can buy a sharps container from a pharmacy, or you can make one by using an empty hard plastic bottle with a cover. How long will my port stay implanted? The port can stay in for as long as your health care provider thinks it is needed. When it is time for the port to come out, a surgery will be done to remove it. The surgery will be similar to the procedure that was done to put the port in. Follow these instructions at home:   Flush your port as told by your health care provider.  If you need an infusion over several days, follow instructions from your health care provider about how to take care of your port site. Make sure you: ? Wash your hands with soap and water before you change your dressing. If soap and water are not available, use alcohol-based hand sanitizer. ? Change your dressing as told by your health care provider. ? Place any used dressings or infusion bags into a plastic bag. Throw that bag in the trash. ? Keep the dressing that covers the needle clean and dry. Do not get it wet. ? Do not use  scissors or sharp objects near the tube. ? Keep the tube clamped, unless it is being used.  Check your port site every day for signs of infection. Check for: ? Redness, swelling, or pain. ? Fluid or blood. ? Pus or a bad smell.  Protect the skin around the port site. ? Avoid wearing bra straps that rub or irritate the site. ? Protect the skin around your port from seat belts. Place a soft pad over your chest if needed.  Bathe or shower as told by your health care provider. The site may get wet as long as you are not actively receiving an infusion.  Return to your normal activities as told by your health care provider. Ask your health care provider what activities are safe for you.  Carry a medical alert card or wear a medical alert bracelet at all times. This will   let health care providers know that you have an implanted port in case of an emergency. Get help right away if:  You have redness, swelling, or pain at the port site.  You have fluid or blood coming from your port site.  You have pus or a bad smell coming from the port site.  You have a fever. Summary  Implanted ports are usually placed in the chest for long-term IV access.  Follow instructions from your health care provider about flushing the port and changing bandages (dressings).  Take care of the area around your port by avoiding clothing that puts pressure on the area, and by watching for signs of infection.  Protect the skin around your port from seat belts. Place a soft pad over your chest if needed.  Get help right away if you have a fever or you have redness, swelling, pain, drainage, or a bad smell at the port site. This information is not intended to replace advice given to you by your health care provider. Make sure you discuss any questions you have with your health care provider. Document Revised: 02/13/2019 Document Reviewed: 11/24/2016 Elsevier Patient Education  2020 [employer]  

## 2020-04-15 NOTE — Telephone Encounter (Signed)
Patient scheduled per Dr.Kale for inpatient admission 04/18/20 for 5 day EPOCH.  Heather [name] in bed placement contacted with patient diagnosis and contact information. Patient will be contacted with admission time on 6/14. Covid screening test scheduled for 04/16/20 at [facility] testing site. Spouse contacted and notified of all information and verbalized understanding of schedule

## 2020-04-15 NOTE — Consult Note (Signed)
Chief Complaint: Chemotherapy access and recurrent pleural effusion  Referring Physician(s): Brunetta Genera  Supervising Physician: Sandi Mariscal  Patient Status: [facility] - Out-pt  History of Present Illness: Heather [name] is a 44 y.o. female History of HIV. Found to have a pancreatic mass and left sided pleural effusion while being worked up for abdominal pain. 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 Team is requesting portacath placement for chemotherapy access and thoracentesis for recurrent pleural effusion   Past Medical History:  Diagnosis Date  . Anemia   . Diffuse large B-cell lymphoma of lymph nodes of multiple regions ([place]) 04/12/2020  . 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 History of blood transfusion    childhood  . HIV (human immunodeficiency virus infection) ([place])   . Immune deficiency disorder [facility])     Past Surgical History:  Procedure Laterality Date  . CHROMOPERTUBATION Bilateral 11/29/2016   Procedure: CHROMOPERTUBATION;  Surgeon: Waymon Amato, MD;  Location: [facility];  Service: Gynecology;  Laterality: Bilateral;  fallopian tubes  . ENDOMETRIAL BIOPSY    . MYOMECTOMY N/A 11/29/2016   Procedure: MYOMECTOMY;  Surgeon: Waymon Amato, MD;  Location: [facility];  Service: Gynecology;  Laterality: N/A;    Allergies: Patient has no known allergies.  Medications: Prior to Admission medications   Medication Sig Start Date End Date Taking? Authorizing Provider  bictegravir-emtricitabine-tenofovir AF (BIKTARVY) 50-200-25 MG TABS tablet Take 1 tablet by mouth daily.    [provider]  cholecalciferol (VITAMIN D3) 25 MCG (1000 UNIT) tablet Take 1,000 Units by mouth daily.    [provider]  lidocaine-prilocaine (EMLA) cream Apply to affected area once 04/12/20   Brunetta Genera, MD  Multiple Vitamin (MULTIVITAMIN WITH MINERALS) TABS tablet Take 1 tablet by mouth daily.    [provider]  pantoprazole (PROTONIX) 40 MG tablet  Take 1 tablet (40 mg total) by mouth daily for 14 days. 04/10/20 04/24/20  Arrien, Jimmy Picket, MD  predniSONE (DELTASONE) 20 MG tablet Take 3 tablets (60 mg total) by mouth daily with breakfast for 14 days. 04/10/20 04/24/20  Arrien, Jimmy Picket, MD     Family History  Problem Relation Age of Onset  . Diabetes Mother   . Hypertension Mother     Social History   Socioeconomic History  . Marital status: Married    Spouse name: Not on file  . Number of children: Not on file  . Years of education: Not on file  . Highest education level: Not on file  Occupational History  . Not on file  Tobacco Use  . Smoking status: Never Smoker  . Smokeless tobacco: Never Used  Substance and Sexual Activity  . Alcohol use: Yes    Comment: occ  . Drug use: No  . Sexual activity: Yes  Other Topics Concern  . Not on file  Social History Narrative  . Not on file   Social Determinants of Health   Financial Resource Strain:   . Difficulty of Paying Living Expenses:   Food Insecurity:   . Worried About Charity fundraiser in the Last Year:   . Arboriculturist in the Last Year:   Transportation Needs:   . Film/video editor (Medical):   Marland Kitchen Lack of Transportation (Non-Medical):   Physical Activity:   . Days of Exercise per Week:   . Minutes of Exercise per Session:   Stress:   . Feeling of Stress :   Social Connections:   .  Frequency of Communication with Friends and Family:   . Frequency of Social Gatherings with Friends and Family:   . Attends Religious Services:   . Active Member of Clubs or Organizations:   . Attends Archivist Meetings:   Marland Kitchen Marital Status:      Review of Systems: A 12 point ROS discussed and pertinent positives are indicated in the HPI above.  All other systems are negative.  Review of Systems  Constitutional: Negative for fatigue and fever.  HENT: Negative for congestion.   Respiratory: Negative for cough and shortness of breath.     Gastrointestinal: Positive for abdominal pain ( yesterday since resolved). Negative for diarrhea, nausea and vomiting.    Vital Signs: LMP 04/05/2020 (Exact Date) Comment: Neg U Preg on 04/06/20.  Physical Exam Vitals and nursing note reviewed.  Constitutional:      Appearance: She is well-developed.  HENT:     Head: Normocephalic and atraumatic.  Eyes:     Conjunctiva/sclera: Conjunctivae normal.  Cardiovascular:     Rate and Rhythm: Normal rate and regular rhythm.     Heart sounds: Normal heart sounds.  Pulmonary:     Effort: Pulmonary effort is normal.     Breath sounds: Normal breath sounds.  Musculoskeletal:        General: Normal range of motion.     Cervical back: Normal range of motion.  Skin:    General: Skin is warm.  Neurological:     Mental Status: She is alert and oriented to person, place, and time.     Imaging: DG Chest 1 View  Result Date: 04/08/2020 CLINICAL DATA:  Post thoracentesis EXAM: CHEST  1 VIEW COMPARISON:  CT 04/07/2020 FINDINGS: Moderate to large left pleural effusion remains following thoracentesis. No pneumothorax. Right lung clear. Heart is normal size. No effusions. IMPRESSION: Moderate to large left effusion with left base atelectasis. No pneumothorax. Electronically Signed   By: Rolm Baptise M.D.   On: 04/08/2020 16:55   DG Chest 2 View  Result Date: 04/14/2020 CLINICAL DATA:  Shortness of breath EXAM: CHEST - 2 VIEW COMPARISON:  04/08/2020 FINDINGS: Moderate to large left pleural effusion, slightly decreased since prior study. Left base atelectasis or infiltrate. Right lung clear. Heart is normal size. IMPRESSION: Moderate to large left effusion with left base atelectasis or infiltrate. Effusion slightly decreased since prior study. Electronically Signed   By: Rolm Baptise M.D.   On: 04/14/2020 14:39   DG Abd 1 View  Result Date: 04/14/2020 CLINICAL DATA:  Shortness of breath, abdominal swelling EXAM: ABDOMEN - 1 VIEW COMPARISON:  CT  04/06/2020 FINDINGS: Nonobstructive bowel gas pattern. No free air or suspicious calcification. Moderate stool burden throughout the colon. Prominent soft tissue density seen in the left upper abdomen corresponding to the previously seen large left upper quadrant mass involving the pancreas and spleen on prior CT. IMPRESSION: No evidence of bowel obstruction or free air. Prominent soft tissue density in the left upper quadrant corresponding to the large mass seen involving much of the pancreas and spleen on prior CT. Electronically Signed   By: Rolm Baptise M.D.   On: 04/14/2020 14:40   CT CHEST W CONTRAST  Result Date: 04/07/2020 CLINICAL DATA:  44 year old female with cancer of unknown primary. Pancreatic mass noted on the CT of the abdomen pelvis dated 04/06/2020. EXAM: CT CHEST WITH CONTRAST TECHNIQUE: Multidetector CT imaging of the chest was performed during intravenous contrast administration. CONTRAST:  85mL OMNIPAQUE IOHEXOL 300 MG/ML  SOLN  COMPARISON:  Chest CT dated 11/29/2017. FINDINGS: Cardiovascular: There is no cardiomegaly or pericardial effusion. The thoracic aorta is unremarkable. The origins of the great vessels of the aortic arch appear patent as visualized. Evaluation of the pulmonary arteries is very limited due to suboptimal opacification and timing of the contrast. No definite large central pulmonary artery embolus identified. V/Q scan may provide better evaluation if there is high clinical concern for acute PE. Mediastinum/Nodes: No hilar or mediastinal adenopathy. The esophagus and the thyroid gland are grossly unremarkable. No mediastinal fluid collection. Lungs/Pleura: Large left pleural effusion with near complete compressive atelectasis of the left lower lobe and partial right upper lobe. Pneumonia is not excluded. Clinical correlation is recommended. The right lung is clear. There is no pneumothorax. There is slight shift of the mediastinum to the right of the midline. The central  airways are patent. Upper Abdomen: Large heterogeneous upper abdominal mass with invasion of the spleen. Musculoskeletal: No acute osseous pathology. IMPRESSION: 1. Large left pleural effusion with near complete compressive atelectasis of the left lower lobe and partial right upper lobe. Pneumonia is not excluded. Clinical correlation is recommended. Consider thoracentesis for symptomatic relief. 2. Large heterogeneous upper abdominal mass with invasion of the spleen. Electronically Signed   By: Anner Crete M.D.   On: 04/07/2020 23:31   CT ABDOMEN PELVIS W CONTRAST  Result Date: 04/06/2020 CLINICAL DATA:  Periodically epigastric pain EXAM: CT ABDOMEN AND PELVIS WITH CONTRAST TECHNIQUE: Multidetector CT imaging of the abdomen and pelvis was performed using the standard protocol following bolus administration of intravenous contrast. CONTRAST:  146mL OMNIPAQUE IOHEXOL 300 MG/ML  SOLN COMPARISON:  None. FINDINGS: Lower chest: The visualized heart size within normal limits. No pericardial fluid/thickening. No hiatal hernia. There is a small to moderate left pleural effusion with basilar atelectasis. Hepatobiliary: The liver is normal in density without focal abnormality.The main portal vein is patent. No evidence of calcified gallstones, gallbladder wall thickening or biliary dilatation. Pancreas: There is a extensive heterogeneously enhancing mass involving nearly the entirety of the pancreatic body and tail. Only a small portion of the pancreatic head appears to be spared. The SMV and SMA appear to be patent. No areas of internal necrosis seen within the mass. The mass appears to extend into the splenic hilum. Spleen: Extensive large multiple heterogeneously enhancing hypodense masses seen throughout the splenic parenchyma likely from the extension of the pancreatic mass which extends through the splenic hilum. There is diffuse splenomegaly. The splenic vein appears to be narrowed wall entering the splenic  hilum. Adrenals/Urinary Tract: Both adrenal glands appear normal. The kidneys and collecting system appear normal without evidence of urinary tract calculus or hydronephrosis. Bladder is unremarkable. Stomach/Bowel: There appears to be diffuse wall thickening seen within the proximal stomach with with the heterogeneous mass seen encompassing the proximal portion. The remainder of the small-bowel and colon are unremarkable. There is a moderate amount of colonic stool present. Vascular/Lymphatic: There is a left periaortic nodule/lymph node causing mild displacement of the third portion of the duodenum measuring 2.8 x 2.7 cm. Scattered small left-sided periaortic lymph nodes are also noted. Reproductive: Extensively enlarged uterus with multiple hypodense fibroids are seen throughout as on a prior MRI dating back to 2017. Small amount of fluid within the endometrial canal. Other: Small amount of abdominopelvic ascites is seen. Musculoskeletal: No acute or significant osseous findings. IMPRESSION: Findings consistent with an extensively enlarged heterogeneous mass involving nearly the entirety of the pancreas, likely consistent with primary pancreatic neoplasm or  lymphoma There is extension into the splenic hilum with diffuse splenic metastases. Retroperitoneal adenopathy, consistent with metastatic disease Diffuse wall thickening seen within the proximal stomach which could either be related to gastritis or possible metastatic disease. Small amount of abdominopelvic ascites Enlarged uterus with multiple uterine fibroids These results were called by telephone at the time of interpretation on 04/06/2020 at 5:19 am to provider [facility] , who verbally acknowledged these results. Electronically Signed   By: Prudencio Pair M.D.   On: 04/06/2020 05:19   CT BIOPSY  Result Date: 04/08/2020 INDICATION: 44 year old female with pancreatic mass versus peripancreatic adenopathy and associated involvement of the spleen. Findings  are concerning for possible lymphoma. Patient presents for CT-guided core biopsy to obtain tissue diagnosis. EXAM: CT-guided core biopsy MEDICATIONS: None. ANESTHESIA/SEDATION: Moderate (conscious) sedation was employed during this procedure. A total of Versed 2 mg and Fentanyl 100 mcg was administered intravenously. Moderate Sedation Time: 12 minutes. The patient's level of consciousness and vital signs were monitored continuously by radiology nursing throughout the procedure under my direct supervision. FLUOROSCOPY TIME:  None. COMPLICATIONS: None immediate. PROCEDURE: Informed written consent was obtained from the patient after a thorough discussion of the procedural risks, benefits and alternatives. All questions were addressed. A timeout was performed prior to the initiation of the procedure. A planning axial CT scan was performed. The mass was identified. A suitable skin entry site was selected and marked. Local anesthesia was attained by infiltration with 1% lidocaine. A small dermatotomy was made. Under intermittent CT guidance, a 17 gauge introducer needle was carefully advanced into the margin of the mass. Multiple 18 gauge core biopsies were then obtained coaxially using the bio Pince automated biopsy device. Biopsy specimens were placed in saline and delivered to pathology for further analysis. Gel-Foam was injected through the introducer needle as it was removed. Post biopsy CT imaging demonstrates no evidence of hematoma or other immediate complication. The patient tolerated the procedure well. IMPRESSION: Technically successful CT-guided biopsy of pancreatic mass versus peripancreatic lymphadenopathy. Electronically Signed   By: Jacqulynn Cadet M.D.   On: 04/08/2020 16:32   US THORACENTESIS ASP PLEURAL SPACE W/IMG GUIDE  Result Date: 04/08/2020 INDICATION: Patient with history of HIV, pancreatic mass, retroperitoneal adenopathy, ascites, splenic masses, left pleural effusion. Request received for  diagnostic and therapeutic left thoracentesis. EXAM: ULTRASOUND GUIDED DIAGNOSTIC AND THERAPEUTIC LEFT THORACENTESIS MEDICATIONS: None COMPLICATIONS: None immediate. PROCEDURE: An ultrasound guided thoracentesis was thoroughly discussed with the patient and questions answered. The benefits, risks, alternatives and complications were also discussed. The patient understands and wishes to proceed with the procedure. Written consent was obtained. Ultrasound was performed to localize and mark an adequate pocket of fluid in the left chest. The area was then prepped and draped in the normal sterile fashion. 1% Lidocaine was used for local anesthesia. Under ultrasound guidance a 6 Fr Safe-T-Centesis catheter was introduced. Thoracentesis was performed. The catheter was removed and a dressing applied. FINDINGS: A total of approximately 1.5 liters of blood-tinged fluid was removed. Samples were sent to the laboratory as requested by the clinical team. Due to patient chest discomfort and coughing only the above amount of fluid was removed today. IMPRESSION: Successful ultrasound guided diagnostic and therapeutic left thoracentesis yielding 1.5 liters of pleural fluid. Read by: Rowe Robert, PA-C Electronically Signed   By: Jacqulynn Cadet M.D.   On: 04/08/2020 16:37    Labs:  CBC: Recent Labs    04/06/20 0350 04/07/20 0537 04/08/20 0516 04/12/20 1415  WBC 7.0  7.2 7.5 11.2*  HGB 9.3* 9.5* 9.1* 10.3*  HCT 29.5* 30.4* 29.3* 32.9*  PLT 554* 496* 486* 667*    COAGS: Recent Labs    04/08/20 0516  INR 1.3*    BMP: Recent Labs    04/06/20 0350 04/07/20 0537 04/12/20 1415  NA 137 135 141  K 3.9 4.0 4.2  CL 101 100 101  CO2 25 25 27   GLUCOSE 126* 120* 143*  BUN 10 <5* 14  CALCIUM 9.4 9.1 10.7*  CREATININE 0.74 0.68 0.85  GFRNONAA >60 >60 >60  GFRAA >60 >60 >60    LIVER FUNCTION TESTS: Recent Labs    04/06/20 0350 04/07/20 0537 04/12/20 1415  BILITOT 0.4 0.5 0.3  AST 21 25 28   ALT 12  11 13   ALKPHOS 87 83 99  PROT 7.2 6.5 7.5  ALBUMIN 3.0* 2.9* 3.1*    TUMOR MARKERS: No results for input(s): AFPTM, CEA, CA199, CHROMGRNA in the last 8760 hours.  Assessment and Plan:  44 y.o, female outpatient. History of HIV, Found to have a apancreatic mass and left sided pleural effusion while being worked up for abdominal pain. 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 Team is requesting portacath placement for chemotherapy access and thoracentesis for recurrent pleural effusion.  Pertinent Imaging 6.3.21 - CT Chest   Pertinent IR History 6.4.21 - Biopsy pancreatic mass 6.4.21 - Thoracentesis left sided   Pertinent Allergies NKDA  WBC is 11.2 Patient is on prednisone. All other labs from 6.8.21)  and medications are within acceptable parameters.  Patient is afebrile.  Risks and benefits of image guided port-a-catheter placement and thoracentesis was discussed with the patient including, but not limited to bleeding, infection, pneumothorax, or fibrin sheath development and need for additional procedures.  All of the patient's questions were answered, patient is agreeable to proceed. Consent signed and in chart.     Thank you for this interesting consult.  I greatly enjoyed meeting [facility] and look forward to participating in their care.  A copy of this report was sent to the requesting provider on this date.  Electronically Signed: Avel Peace, NP 04/15/2020, 8:29 AM   I spent a total of  40 Minutes   in face to face in clinical consultation, greater than 50% of which was counseling/coordinating care for portacath placement and thoracentesis

## 2020-04-15 NOTE — Progress Notes (Signed)
[facility] Progress Note   Heather [name] [id number] Aug 19, 1976 44 y.o.  [name] is managed by Dr. Sullivan Lone  Actively treated with chemotherapy/immunotherapy/hormonal therapy: The patient is pending start of chemotherapy.  Next scheduled appointment with provider: 04/25/2020 Assessment: Plan:    Pleural effusion - Plan: SARS Coronavirus 2 (TAT 6-24 hrs), IR THORACENTESIS ASP PLEURAL SPACE W/IMG GUIDE, IR THORACENTESIS ASP PLEURAL SPACE W/IMG GUIDE, CANCELED: US THORACENTESIS ASP PLEURAL SPACE W/IMG GUIDE, CANCELED: US THORACENTESIS ASP PLEURAL SPACE W/IMG GUIDE  Diffuse large B-cell lymphoma of lymph nodes of multiple regions ([place]) - Plan: IR THORACENTESIS ASP PLEURAL SPACE W/IMG GUIDE, IR THORACENTESIS ASP PLEURAL SPACE W/IMG GUIDE, CANCELED: US THORACENTESIS ASP PLEURAL SPACE W/IMG GUIDE, CANCELED: US THORACENTESIS ASP PLEURAL SPACE W/IMG GUIDE  Shortness of breath - Plan: SARS Coronavirus 2 (TAT 6-24 hrs)   Shortness of breath with a left pleural effusion: The patient was referred for a chest x-ray earlier which showed:  FINDINGS: Moderate to large left pleural effusion, slightly decreased since prior study.  Left base atelectasis or infiltrate.  Right lung clear. Heart is normal size.  IMPRESSION: Moderate to large left effusion with left base atelectasis or infiltrate.  Effusion slightly decreased since prior study.  Additionally she was referred for a KUB which returned showing:  FINDINGS: Nonobstructive bowel gas pattern.  No free air or suspicious calcification.  Moderate stool burden throughout the colon.  Prominent soft tissue density seen in the left upper abdomen corresponding to the previously seen large left upper quadrant mass involving the pancreas and spleen on prior CT.  IMPRESSION: No evidence of bowel obstruction or free air.  Prominent soft tissue density in the left upper quadrant corresponding to the large mass seen  involving much of the pancreas and spleen on prior CT.  A COVID-19 test was collected today.  The patient was referred for a left thoracentesis for tomorrow.    Diffuse large B-cell lymphoma: The patient is scheduled for a PET scan tomorrow and a port placement.  She has a patient education visit scheduled for a patient education visit on 04/20/2020 a follow-up to begin chemotherapy on 04/25/2020.   Please see After Visit Summary for patient specific instructions.  Future Appointments  Date Time Provider [place]  04/16/2020 11:10 AM [facility]-SCREENING [facility] None  04/20/2020 11:00 AM [facility]- ECHO 1-RUTH [facility]ARDUS [facility]  04/20/2020 12:00 PM [facility] CHEMO EDU [facility] None  04/25/2020  8:30 AM [facility] INFUSION [facility] None  04/25/2020  2:15 PM [facility] LAB 5 [facility] None    Orders Placed This Encounter  Procedures  . SARS Coronavirus 2 (TAT 6-24 hrs)  . IR THORACENTESIS ASP PLEURAL SPACE W/IMG GUIDE       Subjective:   Patient ID:  Heather [name] is a 44 y.o. (DOB 10/18/1976) female.  Chief Complaint:  Chief Complaint  Patient presents with  . Abdominal Pain    HPI Heather [name] is a 44 y.o. female with a diagnosis of a diffuse large B-cell lymphoma.  She is followed by Dr. Irene Limbo.  She has a history of a left pleural effusion.  She contacted her office today stating that she was having shortness of breath a sensation of fullness in her left upper quadrant.  She was referred for a chest x-ray which returned showing:  FINDINGS: Moderate to large left pleural effusion, slightly decreased since prior study.  Left base atelectasis or infiltrate.  Right lung clear. Heart is normal size.  IMPRESSION: Moderate to large left effusion with  left base atelectasis or infiltrate.  Effusion slightly decreased since prior study.  Additionally she was referred for a KUB which returned showing:  FINDINGS: Nonobstructive bowel gas pattern.  No free air or suspicious  calcification.  Moderate stool burden throughout the colon.  Prominent soft tissue density seen in the left upper abdomen corresponding to the previously seen large left upper quadrant mass involving the pancreas and spleen on prior CT.  IMPRESSION: No evidence of bowel obstruction or free air.  Prominent soft tissue density in the left upper quadrant corresponding to the large mass seen involving much of the pancreas and spleen on prior CT.  She is scheduled for a port placement and a PET scan tomorrow.  She denies any other issues of concern today.    Medications: I have reviewed the patient's current medications.  Allergies: No Known Allergies  Past Medical History:  Diagnosis Date  . Anemia   . Diffuse large B-cell lymphoma of lymph nodes of multiple regions ([place]) 04/12/2020  . 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 History of blood transfusion    childhood  . HIV (human immunodeficiency virus infection) ([place])   . Immune deficiency disorder [facility])     Past Surgical History:  Procedure Laterality Date  . CHROMOPERTUBATION Bilateral 11/29/2016   Procedure: CHROMOPERTUBATION;  Surgeon: Waymon Amato, MD;  Location: [facility];  Service: Gynecology;  Laterality: Bilateral;  fallopian tubes  . ENDOMETRIAL BIOPSY    . IR IMAGING GUIDED PORT INSERTION  04/15/2020  . MYOMECTOMY N/A 11/29/2016   Procedure: MYOMECTOMY;  Surgeon: Waymon Amato, MD;  Location: [facility];  Service: Gynecology;  Laterality: N/A;    Family History  Problem Relation Age of Onset  . Diabetes Mother   . Hypertension Mother     Social History   Socioeconomic History  . Marital status: Married    Spouse name: Not on file  . Number of children: Not on file  . Years of education: Not on file  . Highest education level: Not on file  Occupational History  . Not on file  Tobacco Use  . Smoking status: Never Smoker  . Smokeless tobacco: Never Used  Substance and Sexual Activity  . Alcohol use: Yes    Comment:  occ  . Drug use: No  . Sexual activity: Yes  Other Topics Concern  . Not on file  Social History Narrative  . Not on file   Social Determinants of Health   Financial Resource Strain:   . Difficulty of Paying Living Expenses:   Food Insecurity:   . Worried About Charity fundraiser in the Last Year:   . Arboriculturist in the Last Year:   Transportation Needs:   . Film/video editor (Medical):   Marland Kitchen Lack of Transportation (Non-Medical):   Physical Activity:   . Days of Exercise per Week:   . Minutes of Exercise per Session:   Stress:   . Feeling of Stress :   Social Connections:   . Frequency of Communication with Friends and Family:   . Frequency of Social Gatherings with Friends and Family:   . Attends Religious Services:   . Active Member of Clubs or Organizations:   . Attends Archivist Meetings:   Marland Kitchen Marital Status:   Intimate Partner Violence:   . Fear of Current or Ex-Partner:   . Emotionally Abused:   Marland Kitchen Physically Abused:   . Sexually Abused:     Past  Medical History, Surgical history, Social history, and Family history were reviewed and updated as appropriate.   Please see review of systems for further details on the patient's review from today.   Review of Systems:  Review of Systems  Constitutional: Negative for chills, diaphoresis and fever.  HENT: Negative for trouble swallowing.   Respiratory: Positive for shortness of breath. Negative for cough, choking, chest tightness, wheezing and stridor.   Cardiovascular: Negative for chest pain and palpitations.  Gastrointestinal:       Fullness in the left upper quadrant.    Objective:   Physical Exam:  BP (!) 143/92 (BP Location: Left Arm, Patient Position: Sitting)   Pulse (!) 104   Temp 97.9 F (36.6 C) (Temporal)   Resp 18   Ht 5' (1.524 m)   Wt 127 lb (57.6 kg)   LMP 04/05/2020 (Exact Date) Comment: Neg U Preg on 04/06/20.  SpO2 100%   BMI 24.80 kg/m  ECOG: 0  Physical  Exam Constitutional:      General: She is not in acute distress.    Appearance: She is not diaphoretic.  HENT:     Head: Normocephalic and atraumatic.  Cardiovascular:     Rate and Rhythm: Normal rate and regular rhythm.     Heart sounds: Normal heart sounds. No murmur heard.  No friction rub. No gallop.   Pulmonary:     Effort: Pulmonary effort is normal. No respiratory distress.     Breath sounds: Examination of the left-middle field reveals decreased breath sounds. Examination of the left-lower field reveals decreased breath sounds. Decreased breath sounds present. No wheezing or rales.  Skin:    General: Skin is warm and dry.     Findings: No erythema or rash.  Neurological:     Mental Status: She is alert.     Coordination: Coordination normal.     Gait: Gait normal.  Psychiatric:        Mood and Affect: Mood normal.        Behavior: Behavior normal.        Thought Content: Thought content normal.        Judgment: Judgment normal.     Lab Review:     Component Value Date/Time   NA 141 04/12/2020 1415   K 4.2 04/12/2020 1415   CL 101 04/12/2020 1415   CO2 27 04/12/2020 1415   GLUCOSE 143 (H) 04/12/2020 1415   BUN 14 04/12/2020 1415   CREATININE 0.85 04/12/2020 1415   CALCIUM 10.7 (H) 04/12/2020 1415   PROT 7.5 04/12/2020 1415   ALBUMIN 3.1 (L) 04/12/2020 1415   AST 28 04/12/2020 1415   ALT 13 04/12/2020 1415   ALKPHOS 99 04/12/2020 1415   BILITOT 0.3 04/12/2020 1415   GFRNONAA >60 04/12/2020 1415   GFRAA >60 04/12/2020 1415       Component Value Date/Time   WBC 11.2 (H) 04/12/2020 1415   RBC 4.75 04/12/2020 1415   HGB 10.3 (L) 04/12/2020 1415   HCT 32.9 (L) 04/12/2020 1415   PLT 667 (H) 04/12/2020 1415   MCV 69.3 (L) 04/12/2020 1415   MCH 21.7 (L) 04/12/2020 1415   MCHC 31.3 04/12/2020 1415   RDW 17.9 (H) 04/12/2020 1415   LYMPHSABS 0.3 (L) 04/12/2020 1415   MONOABS 0.7 04/12/2020 1415   EOSABS 0.0 04/12/2020 1415   BASOSABS 0.0 04/12/2020 1415    -------------------------------  Imaging from last 24 hours (if applicable):  Radiology interpretation: DG Chest 1 View  Result Date:  04/08/2020 CLINICAL DATA:  Post thoracentesis EXAM: CHEST  1 VIEW COMPARISON:  CT 04/07/2020 FINDINGS: Moderate to large left pleural effusion remains following thoracentesis. No pneumothorax. Right lung clear. Heart is normal size. No effusions. IMPRESSION: Moderate to large left effusion with left base atelectasis. No pneumothorax. Electronically Signed   By: Rolm Baptise M.D.   On: 04/08/2020 16:55   DG Chest 2 View  Result Date: 04/14/2020 CLINICAL DATA:  Shortness of breath EXAM: CHEST - 2 VIEW COMPARISON:  04/08/2020 FINDINGS: Moderate to large left pleural effusion, slightly decreased since prior study. Left base atelectasis or infiltrate. Right lung clear. Heart is normal size. IMPRESSION: Moderate to large left effusion with left base atelectasis or infiltrate. Effusion slightly decreased since prior study. Electronically Signed   By: Rolm Baptise M.D.   On: 04/14/2020 14:39   DG Abd 1 View  Result Date: 04/14/2020 CLINICAL DATA:  Shortness of breath, abdominal swelling EXAM: ABDOMEN - 1 VIEW COMPARISON:  CT 04/06/2020 FINDINGS: Nonobstructive bowel gas pattern. No free air or suspicious calcification. Moderate stool burden throughout the colon. Prominent soft tissue density seen in the left upper abdomen corresponding to the previously seen large left upper quadrant mass involving the pancreas and spleen on prior CT. IMPRESSION: No evidence of bowel obstruction or free air. Prominent soft tissue density in the left upper quadrant corresponding to the large mass seen involving much of the pancreas and spleen on prior CT. Electronically Signed   By: Rolm Baptise M.D.   On: 04/14/2020 14:40   CT CHEST W CONTRAST  Result Date: 04/07/2020 CLINICAL DATA:  45 year old female with cancer of unknown primary. Pancreatic mass noted on the CT of the abdomen pelvis  dated 04/06/2020. EXAM: CT CHEST WITH CONTRAST TECHNIQUE: Multidetector CT imaging of the chest was performed during intravenous contrast administration. CONTRAST:  45m OMNIPAQUE IOHEXOL 300 MG/ML  SOLN COMPARISON:  Chest CT dated 11/29/2017. FINDINGS: Cardiovascular: There is no cardiomegaly or pericardial effusion. The thoracic aorta is unremarkable. The origins of the great vessels of the aortic arch appear patent as visualized. Evaluation of the pulmonary arteries is very limited due to suboptimal opacification and timing of the contrast. No definite large central pulmonary artery embolus identified. V/Q scan may provide better evaluation if there is high clinical concern for acute PE. Mediastinum/Nodes: No hilar or mediastinal adenopathy. The esophagus and the thyroid gland are grossly unremarkable. No mediastinal fluid collection. Lungs/Pleura: Large left pleural effusion with near complete compressive atelectasis of the left lower lobe and partial right upper lobe. Pneumonia is not excluded. Clinical correlation is recommended. The right lung is clear. There is no pneumothorax. There is slight shift of the mediastinum to the right of the midline. The central airways are patent. Upper Abdomen: Large heterogeneous upper abdominal mass with invasion of the spleen. Musculoskeletal: No acute osseous pathology. IMPRESSION: 1. Large left pleural effusion with near complete compressive atelectasis of the left lower lobe and partial right upper lobe. Pneumonia is not excluded. Clinical correlation is recommended. Consider thoracentesis for symptomatic relief. 2. Large heterogeneous upper abdominal mass with invasion of the spleen. Electronically Signed   By: AAnner CreteM.D.   On: 04/07/2020 23:31   CT ABDOMEN PELVIS W CONTRAST  Result Date: 04/06/2020 CLINICAL DATA:  Periodically epigastric pain EXAM: CT ABDOMEN AND PELVIS WITH CONTRAST TECHNIQUE: Multidetector CT imaging of the abdomen and pelvis was  performed using the standard protocol following bolus administration of intravenous contrast. CONTRAST:  1042mOMNIPAQUE IOHEXOL 300 MG/ML  SOLN COMPARISON:  None. FINDINGS: Lower chest: The visualized heart size within normal limits. No pericardial fluid/thickening. No hiatal hernia. There is a small to moderate left pleural effusion with basilar atelectasis. Hepatobiliary: The liver is normal in density without focal abnormality.The main portal vein is patent. No evidence of calcified gallstones, gallbladder wall thickening or biliary dilatation. Pancreas: There is a extensive heterogeneously enhancing mass involving nearly the entirety of the pancreatic body and tail. Only a small portion of the pancreatic head appears to be spared. The SMV and SMA appear to be patent. No areas of internal necrosis seen within the mass. The mass appears to extend into the splenic hilum. Spleen: Extensive large multiple heterogeneously enhancing hypodense masses seen throughout the splenic parenchyma likely from the extension of the pancreatic mass which extends through the splenic hilum. There is diffuse splenomegaly. The splenic vein appears to be narrowed wall entering the splenic hilum. Adrenals/Urinary Tract: Both adrenal glands appear normal. The kidneys and collecting system appear normal without evidence of urinary tract calculus or hydronephrosis. Bladder is unremarkable. Stomach/Bowel: There appears to be diffuse wall thickening seen within the proximal stomach with with the heterogeneous mass seen encompassing the proximal portion. The remainder of the small-bowel and colon are unremarkable. There is a moderate amount of colonic stool present. Vascular/Lymphatic: There is a left periaortic nodule/lymph node causing mild displacement of the third portion of the duodenum measuring 2.8 x 2.7 cm. Scattered small left-sided periaortic lymph nodes are also noted. Reproductive: Extensively enlarged uterus with multiple  hypodense fibroids are seen throughout as on a prior MRI dating back to 2017. Small amount of fluid within the endometrial canal. Other: Small amount of abdominopelvic ascites is seen. Musculoskeletal: No acute or significant osseous findings. 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 Retroperitoneal adenopathy, consistent with metastatic disease Diffuse wall thickening seen within the proximal stomach which could either be related to gastritis or possible metastatic disease. Small amount of abdominopelvic ascites Enlarged uterus with multiple uterine fibroids These results were called by telephone at the time of interpretation on 04/06/2020 at 5:19 am to provider [facility] , who verbally acknowledged these results. Electronically Signed   By: Prudencio Pair M.D.   On: 04/06/2020 05:19   NM PET Image Initial (PI) Skull Base To Thigh  Result Date: 04/15/2020 CLINICAL DATA:  Initial treatment strategy for diffuse large B-cell lymphoma. EXAM: NUCLEAR MEDICINE PET SKULL BASE TO THIGH TECHNIQUE: 6.4 mCi F-18 FDG was injected intravenously. Full-ring PET imaging was performed from the skull base to thigh after the radiotracer. CT data was obtained and used for attenuation correction and anatomic localization. Fasting blood glucose: 101 mg/dl COMPARISON:  CT scan 04/06/2020 FINDINGS: Mediastinal blood pool activity: SUV max 1.84 Liver activity: SUV max 2.61 NECK: No hypermetabolic lymph nodes in the neck. Incidental CT findings: none CHEST: No hypermetabolic mediastinal or hilar nodes. No suspicious pulmonary nodules on the CT scan. No hypermetabolic breast lesions, supraclavicular or axillary adenopathy. Incidental CT findings: Persistent left pleural effusion and overlying left lower lobe  atelectasis. ABDOMEN/PELVIS: Large left upper quadrant abdominal mass involving the spleen, stomach and retroperitoneum is markedly hypermetabolic with SUV max of 56.81. Associated contralateral retroperitoneal lymphadenopathy is hypermetabolic with SUV max of 27.51. No pelvic lymphadenopathy. No inguinal lymphadenopathy. No worrisome hepatic lesions. Incidental CT findings: Markedly enlarged fibroid uterus is  noted. Simple appearing cyst associated with the left ovary. Subcutaneous lesions involving the anterior abdominal wall. One is near the umbilicus. The other is overlying the mid pelvis. These have the appearance of some type of skin blister. Recommend clinical correlation. Mild hypermetabolism could suggest an inflammatory process. Given patient's age IV status Kaposi's sarcoma would be another possibility. SKELETON: No findings suspicious for osseous metastatic disease. Incidental CT findings: none IMPRESSION: 1. Large left upper quadrant mass is markedly hypermetabolic and consistent with patient's known lymphoma. 2. No findings to suggest involvement of the neck, chest or pelvis or bony structures. 3. Enlarged fibroid uterus. 4. Midline anterior abdominal skin lesions. Mild hypermetabolism. These could reflect some type of skin blisters. I suppose Kaposi's sarcoma would be another consideration given the patient's HIV status. Electronically Signed   By: Marijo Sanes M.D.   On: 04/15/2020 14:20   CT BIOPSY  Result Date: 04/08/2020 INDICATION: 44 year old female with pancreatic mass versus peripancreatic adenopathy and associated involvement of the spleen. Findings are concerning for possible lymphoma. Patient presents for CT-guided core biopsy to obtain tissue diagnosis. EXAM: CT-guided core biopsy MEDICATIONS: None. ANESTHESIA/SEDATION: Moderate (conscious) sedation was employed during this procedure. A total of Versed 2 mg and Fentanyl 100 mcg was administered intravenously. Moderate Sedation Time: 12  minutes. The patient's level of consciousness and vital signs were monitored continuously by radiology nursing throughout the procedure under my direct supervision. FLUOROSCOPY TIME:  None. COMPLICATIONS: None immediate. PROCEDURE: Informed written consent was obtained from the patient after a thorough discussion of the procedural risks, benefits and alternatives. All questions were addressed. A timeout was performed prior to the initiation of the procedure. A planning axial CT scan was performed. The mass was identified. A suitable skin entry site was selected and marked. Local anesthesia was attained by infiltration with 1% lidocaine. A small dermatotomy was made. Under intermittent CT guidance, a 17 gauge introducer needle was carefully advanced into the margin of the mass. Multiple 18 gauge core biopsies were then obtained coaxially using the bio Pince automated biopsy device. Biopsy specimens were placed in saline and delivered to pathology for further analysis. Gel-Foam was injected through the introducer needle as it was removed. Post biopsy CT imaging demonstrates no evidence of hematoma or other immediate complication. The patient tolerated the procedure well. IMPRESSION: Technically successful CT-guided biopsy of pancreatic mass versus peripancreatic lymphadenopathy. Electronically Signed   By: Jacqulynn Cadet M.D.   On: 04/08/2020 16:32   IR IMAGING GUIDED PORT INSERTION  Result Date: 04/15/2020 INDICATION: History of diffuse large B-cell lymphoma. In need of durable intravenous access for chemotherapy administration. EXAM: IMPLANTED PORT A CATH PLACEMENT WITH ULTRASOUND AND FLUOROSCOPIC GUIDANCE COMPARISON:  Chest CT-04/07/2020 MEDICATIONS: Ancef 2 gm IV; The antibiotic was administered within an appropriate time interval prior to skin puncture. ANESTHESIA/SEDATION: Moderate (conscious) sedation was employed during this procedure. A total of Versed 4 mg and Fentanyl 100 mcg was administered  intravenously. Moderate Sedation Time: 26 minutes. The patient's level of consciousness and vital signs were monitored continuously by radiology nursing throughout the procedure under my direct supervision. CONTRAST:  None FLUOROSCOPY TIME:  30 seconds (5 mGy) COMPLICATIONS: None immediate. PROCEDURE: The procedure, risks, benefits, and alternatives were explained to the patient. Questions regarding the procedure were encouraged and answered. The patient understands and consents to the procedure. The right neck and chest were prepped with chlorhexidine in a sterile fashion, and a sterile drape was applied covering the operative field. Maximum barrier sterile technique with sterile  gowns and gloves were used for the procedure. A timeout was performed prior to the initiation of the procedure. Local anesthesia was provided with 1% lidocaine with epinephrine. After creating a small venotomy incision, a micropuncture kit was utilized to access the internal jugular vein. Real-time ultrasound guidance was utilized for vascular access including the acquisition of a permanent ultrasound image documenting patency of the accessed vessel. The microwire was utilized to measure appropriate catheter length. A subcutaneous port pocket was then created along the upper chest wall utilizing a combination of sharp and blunt dissection. The pocket was irrigated with sterile saline. A single lumen "ISP" sized power injectable port was chosen for placement. The 8 Fr catheter was tunneled from the port pocket site to the venotomy incision. The port was placed in the pocket. The external catheter was trimmed to appropriate length. At the venotomy, an 8 Fr peel-away sheath was placed over a guidewire under fluoroscopic guidance. The catheter was then placed through the sheath and the sheath was removed. Final catheter positioning was confirmed and documented with a fluoroscopic spot radiograph. The port was accessed with a Huber needle,  aspirated and flushed with heparinized saline. The venotomy site was closed with an interrupted 4-0 Vicryl suture. The port pocket incision was closed with interrupted 2-0 Vicryl suture. The skin was opposed with a running subcuticular 4-0 Vicryl suture. Dermabond and Steri-strips were applied to both incisions. Dressings were applied. The patient tolerated the procedure well without immediate post procedural complication. FINDINGS: After catheter placement, the tip lies within the superior cavoatrial junction. The catheter aspirates and flushes normally and is ready for immediate use. IMPRESSION: Successful placement of a right internal jugular approach power injectable Port-A-Cath. The catheter is ready for immediate use. Electronically Signed   By: Sandi Mariscal M.D.   On: 04/15/2020 15:28   US THORACENTESIS ASP PLEURAL SPACE W/IMG GUIDE  Result Date: 04/08/2020 INDICATION: Patient with history of HIV, pancreatic mass, retroperitoneal adenopathy, ascites, splenic masses, left pleural effusion. Request received for diagnostic and therapeutic left thoracentesis. EXAM: ULTRASOUND GUIDED DIAGNOSTIC AND THERAPEUTIC LEFT THORACENTESIS MEDICATIONS: None COMPLICATIONS: None immediate. PROCEDURE: An ultrasound guided thoracentesis was thoroughly discussed with the patient and questions answered. The benefits, risks, alternatives and complications were also discussed. The patient understands and wishes to proceed with the procedure. Written consent was obtained. Ultrasound was performed to localize and mark an adequate pocket of fluid in the left chest. The area was then prepped and draped in the normal sterile fashion. 1% Lidocaine was used for local anesthesia. Under ultrasound guidance a 6 Fr Safe-T-Centesis catheter was introduced. Thoracentesis was performed. The catheter was removed and a dressing applied. FINDINGS: A total of approximately 1.5 liters of blood-tinged fluid was removed. Samples were sent to the  laboratory as requested by the clinical team. Due to patient chest discomfort and coughing only the above amount of fluid was removed today. IMPRESSION: Successful ultrasound guided diagnostic and therapeutic left thoracentesis yielding 1.5 liters of pleural fluid. Read by: Rowe Robert, PA-C Electronically Signed   By: Jacqulynn Cadet M.D.   On: 04/08/2020 16:37

## 2020-04-16 ENCOUNTER — Other Ambulatory Visit (HOSPITAL_COMMUNITY)
Admission: RE | Admit: 2020-04-16 | Discharge: 2020-04-16 | Disposition: A | Discharge status: Home / Self Care | Payer: 59 | Source: Ambulatory Visit | Attending: Hematology | Admitting: Hematology

## 2020-04-16 DIAGNOSIS — Z20822 Contact with and (suspected) exposure to covid-19: Secondary | ICD-10-CM | POA: Insufficient documentation

## 2020-04-16 DIAGNOSIS — Z01812 Encounter for preprocedural laboratory examination: Secondary | ICD-10-CM | POA: Insufficient documentation

## 2020-04-16 LAB — SARS CORONAVIRUS 2 (TAT 6-24 HRS): SARS Coronavirus 2: NEGATIVE

## 2020-04-18 ENCOUNTER — Inpatient Hospital Stay (HOSPITAL_COMMUNITY)
Admit: 2020-04-18 | Discharge: 2020-04-22 | DRG: 847 | Disposition: A | Discharge status: Home / Self Care | Payer: 59 | Source: Ambulatory Visit | Attending: Hematology | Admitting: Hematology

## 2020-04-18 ENCOUNTER — Other Ambulatory Visit: Payer: Self-pay

## 2020-04-18 ENCOUNTER — Encounter (HOSPITAL_COMMUNITY): Payer: Self-pay | Admitting: Hematology

## 2020-04-18 ENCOUNTER — Inpatient Hospital Stay (HOSPITAL_COMMUNITY): Payer: 59

## 2020-04-18 DIAGNOSIS — K5900 Constipation, unspecified: Secondary | ICD-10-CM | POA: Diagnosis present

## 2020-04-18 DIAGNOSIS — Z20822 Contact with and (suspected) exposure to covid-19: Secondary | ICD-10-CM | POA: Diagnosis present

## 2020-04-18 DIAGNOSIS — E440 Moderate protein-calorie malnutrition: Secondary | ICD-10-CM | POA: Diagnosis present

## 2020-04-18 DIAGNOSIS — R630 Anorexia: Secondary | ICD-10-CM | POA: Diagnosis present

## 2020-04-18 DIAGNOSIS — Z95828 Presence of other vascular implants and grafts: Secondary | ICD-10-CM

## 2020-04-18 DIAGNOSIS — Z8249 Family history of ischemic heart disease and other diseases of the circulatory system: Secondary | ICD-10-CM

## 2020-04-18 DIAGNOSIS — C7889 Secondary malignant neoplasm of other digestive organs: Secondary | ICD-10-CM | POA: Diagnosis present

## 2020-04-18 DIAGNOSIS — Z7189 Other specified counseling: Secondary | ICD-10-CM

## 2020-04-18 DIAGNOSIS — E876 Hypokalemia: Secondary | ICD-10-CM | POA: Diagnosis not present

## 2020-04-18 DIAGNOSIS — Z9189 Other specified personal risk factors, not elsewhere classified: Secondary | ICD-10-CM | POA: Diagnosis not present

## 2020-04-18 DIAGNOSIS — Z6824 Body mass index (BMI) 24.0-24.9, adult: Secondary | ICD-10-CM

## 2020-04-18 DIAGNOSIS — Z833 Family history of diabetes mellitus: Secondary | ICD-10-CM

## 2020-04-18 DIAGNOSIS — C8338 Diffuse large B-cell lymphoma, lymph nodes of multiple sites: Secondary | ICD-10-CM | POA: Diagnosis present

## 2020-04-18 DIAGNOSIS — R7402 Elevation of levels of lactic acid dehydrogenase (LDH): Secondary | ICD-10-CM | POA: Diagnosis present

## 2020-04-18 DIAGNOSIS — B20 Human immunodeficiency virus [HIV] disease: Secondary | ICD-10-CM | POA: Diagnosis present

## 2020-04-18 DIAGNOSIS — Z0189 Encounter for other specified special examinations: Secondary | ICD-10-CM

## 2020-04-18 DIAGNOSIS — D509 Iron deficiency anemia, unspecified: Secondary | ICD-10-CM

## 2020-04-18 DIAGNOSIS — Z5111 Encounter for antineoplastic chemotherapy: Secondary | ICD-10-CM | POA: Diagnosis present

## 2020-04-18 LAB — COMPREHENSIVE METABOLIC PANEL
ALT: 16 U/L (ref 0–44)
AST: 34 U/L (ref 15–41)
Albumin: 3.2 g/dL — ABNORMAL LOW (ref 3.5–5.0)
Alkaline Phosphatase: 88 U/L (ref 38–126)
Anion gap: 14 (ref 5–15)
BUN: 18 mg/dL (ref 6–20)
CO2: 23 mmol/L (ref 22–32)
Calcium: 10.5 mg/dL — ABNORMAL HIGH (ref 8.9–10.3)
Chloride: 101 mmol/L (ref 98–111)
Creatinine, Ser: 0.81 mg/dL (ref 0.44–1.00)
GFR calc Af Amer: >60 mL/min (ref >60)
GFR calc non Af Amer: >60 mL/min (ref >60)
Glucose, Bld: 119 mg/dL — ABNORMAL HIGH (ref 70–99)
Potassium: 3.9 mmol/L (ref 3.5–5.1)
Sodium: 138 mmol/L (ref 135–145)
Total Bilirubin: 0.7 mg/dL (ref 0.3–1.2)
Total Protein: 7.2 g/dL (ref 6.5–8.1)

## 2020-04-18 LAB — CBC WITH DIFFERENTIAL/PLATELET
Abs Immature Granulocytes: 0.2 10*3/uL — ABNORMAL HIGH (ref 0.00–0.07)
Basophils Absolute: 0 10*3/uL (ref 0.0–0.1)
Basophils Relative: 0 %
Eosinophils Absolute: 0 10*3/uL (ref 0.0–0.5)
Eosinophils Relative: 0 %
HCT: 31.8 % — ABNORMAL LOW (ref 36.0–46.0)
Hemoglobin: 10.2 g/dL — ABNORMAL LOW (ref 12.0–15.0)
Immature Granulocytes: 2 %
Lymphocytes Relative: 3 %
Lymphs Abs: 0.4 10*3/uL — ABNORMAL LOW (ref 0.7–4.0)
MCH: 22.1 pg — ABNORMAL LOW (ref 26.0–34.0)
MCHC: 32.1 g/dL (ref 30.0–36.0)
MCV: 69 fL — ABNORMAL LOW (ref 80.0–100.0)
Monocytes Absolute: 1.5 10*3/uL — ABNORMAL HIGH (ref 0.1–1.0)
Monocytes Relative: 11 %
Neutro Abs: 11.3 10*3/uL — ABNORMAL HIGH (ref 1.7–7.7)
Neutrophils Relative %: 84 %
Platelets: 588 10*3/uL — ABNORMAL HIGH (ref 150–400)
RBC: 4.61 MIL/uL (ref 3.87–5.11)
RDW: 20.4 % — ABNORMAL HIGH (ref 11.5–15.5)
WBC: 13.5 10*3/uL — ABNORMAL HIGH (ref 4.0–10.5)
nRBC: 0.1 % (ref 0.0–0.2)

## 2020-04-18 LAB — ECHOCARDIOGRAM COMPLETE: Weight: 2001.6 oz

## 2020-04-18 MED ORDER — ALLOPURINOL 100 MG PO TABS
100.0000 mg | ORAL_TABLET | Freq: Two times a day (BID) | ORAL | Status: DC
  Administered 2020-04-18 – 2020-04-22 (×9): 100 mg via ORAL
  Filled 2020-04-18 (×9): qty 1

## 2020-04-18 MED ORDER — SODIUM CHLORIDE 0.9% FLUSH
10.0000 mL | Freq: Two times a day (BID) | INTRAVENOUS | Status: DC
  Administered 2020-04-19 – 2020-04-21 (×5): 10 mL

## 2020-04-18 MED ORDER — ADULT MULTIVITAMIN W/MINERALS CH
1.0000 | ORAL_TABLET | Freq: Every day | ORAL | Status: DC
  Administered 2020-04-18 – 2020-04-21 (×4): 1 via ORAL
  Filled 2020-04-18 (×4): qty 1

## 2020-04-18 MED ORDER — ENOXAPARIN SODIUM 40 MG/0.4ML ~~LOC~~ SOLN
40.0000 mg | SUBCUTANEOUS | Status: DC
  Administered 2020-04-18 – 2020-04-21 (×4): 40 mg via SUBCUTANEOUS
  Filled 2020-04-18 (×4): qty 0.4

## 2020-04-18 MED ORDER — IBUPROFEN 200 MG PO TABS
600.0000 mg | ORAL_TABLET | Freq: Once | ORAL | Status: AC
  Administered 2020-04-18: 600 mg via ORAL
  Filled 2020-04-18: qty 3

## 2020-04-18 MED ORDER — BICTEGRAVIR-EMTRICITAB-TENOFOV 50-200-25 MG PO TABS
1.0000 | ORAL_TABLET | Freq: Every day | ORAL | Status: DC
  Filled 2020-04-18: qty 1

## 2020-04-18 MED ORDER — PREDNISONE 20 MG PO TABS
60.0000 mg | ORAL_TABLET | Freq: Every day | ORAL | Status: DC
  Administered 2020-04-19 – 2020-04-22 (×4): 60 mg via ORAL
  Filled 2020-04-18 (×5): qty 3

## 2020-04-18 MED ORDER — SODIUM CHLORIDE 0.9 % IV SOLN: INTRAVENOUS | Status: DC

## 2020-04-18 MED ORDER — SODIUM CHLORIDE 0.9% FLUSH
10.0000 mL | INTRAVENOUS | Status: DC | PRN
  Administered 2020-04-18: 10 mL

## 2020-04-18 MED ORDER — VITAMIN D 25 MCG (1000 UNIT) PO TABS
1000.0000 [IU] | ORAL_TABLET | Freq: Every day | ORAL | Status: DC
  Administered 2020-04-19 – 2020-04-22 (×4): 1000 [IU] via ORAL
  Filled 2020-04-18 (×4): qty 1

## 2020-04-18 MED ORDER — SODIUM CHLORIDE 0.9 % IV SOLN
Freq: Once | INTRAVENOUS | Status: AC
  Administered 2020-04-18: 8 mg via INTRAVENOUS
  Filled 2020-04-18: qty 4

## 2020-04-18 MED ORDER — PANTOPRAZOLE SODIUM 40 MG PO TBEC
40.0000 mg | DELAYED_RELEASE_TABLET | Freq: Every day | ORAL | Status: DC
  Administered 2020-04-19 – 2020-04-22 (×4): 40 mg via ORAL
  Filled 2020-04-18 (×4): qty 1

## 2020-04-18 MED ORDER — CHLORHEXIDINE GLUCONATE CLOTH 2 % EX PADS
6.0000 | MEDICATED_PAD | Freq: Every day | CUTANEOUS | Status: DC
  Administered 2020-04-18 – 2020-04-22 (×5): 6 via TOPICAL

## 2020-04-18 MED ORDER — SODIUM CHLORIDE 0.9 % IV SOLN
8.0000 mg | Freq: Three times a day (TID) | INTRAVENOUS | Status: DC | PRN
  Filled 2020-04-18: qty 4

## 2020-04-18 MED ORDER — PREDNISONE 20 MG PO TABS: 60.0000 mg | ORAL_TABLET | Freq: Every day | ORAL | Status: DC

## 2020-04-18 MED ORDER — VINCRISTINE SULFATE CHEMO INJECTION 1 MG/ML
Freq: Once | INTRAVENOUS | Status: AC
  Filled 2020-04-18: qty 8

## 2020-04-18 MED ORDER — PROCHLORPERAZINE MALEATE 10 MG PO TABS: 10.0000 mg | ORAL_TABLET | Freq: Four times a day (QID) | ORAL | Status: DC | PRN

## 2020-04-18 NOTE — Progress Notes (Signed)
Chemotherapy education completed with patient and her husband at bedside.  Patient and husband asked great questions and provided them answers.  Encouraged them to write down any questions that they may think of to ask nurses or physicians.  Both verbalized understanding of education.  Consent signed and placed on shadow chart.

## 2020-04-18 NOTE — H&P (Addendum)
[place]  [phone] [phone]   MEDICAL ONCOLOGY - ADMISSION H&P  Reason for Referral: Cycle #1 EPOCH-R  HPI: Ms. Ungar is a wonderful 44 y.o. female with a past medical history significant for hepatitis B, HIV, chronic anemia who presented to [facility] with abdominal pain. She had a 2-week history of early satiety, poor appetite and, 5 pound weight loss. Abdominal pain is typically postprandial and mainly at night after dinner while laying in bed. Has been associated with nausea improves after vomiting. Labs on admission showed a hemoglobin of 9.3, MCV 67.4, platelet count 554,000. Lipase was mildly elevated at 123. 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  During her initial consult, she was having ongoingpain is primarily in her epigastric area and radiates to the left side of her abdomen. States pain is worse when laying down after she eats. She states that she has had this pain for at least 2 weeks. Her abdominal pain is worse after eating. She has had some nausea and intermittent vomiting. Vomiting makes her pain better. She was attributing her symptoms to some the medications that she was taking for fertility treatments. She reports a poor appetite and about a 5 pound weight loss. She is not having any fevers or chills. Denies night sweats. She has not noticed any palpable lymphadenopathy. The patient underwent a biopsy of her pancreatic mass on 04/08/2020 ([id number]) with results feeling diffuse  large B-cell lymphoma.  It was recommended for her to undergo systemic chemotherapy with EPOCH-R.   Today, the patient reports mild abdominal discomfort which comes and goes.  Reports that abdominal pain is better after vomiting.  She vomited last night, but no nausea or vomiting this morning.  Reports that breathing is better following thoracentesis this past Friday.  She had a Port-A-Cath placed in anticipation of chemotherapy and tolerated the procedure well.  She has no chest pain today.  She reports that her appetite is decreased.  She has no other complaints this morning.  She is here for admission for cycle #1 of EPOCH-R.    Past Medical History:  Diagnosis Date  . Anemia   . Diffuse large B-cell lymphoma of lymph nodes of multiple regions ([place]) 04/12/2020  . 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 History of blood transfusion    childhood  . HIV (human immunodeficiency virus infection) ([place])   . Immune deficiency disorder [facility])   :  Past Surgical History:  Procedure Laterality Date  . CHROMOPERTUBATION Bilateral 11/29/2016   Procedure: CHROMOPERTUBATION;  Surgeon: Waymon Amato, MD;  Location: [facility];  Service: Gynecology;  Laterality: Bilateral;  fallopian tubes  . ENDOMETRIAL BIOPSY    . IR IMAGING GUIDED PORT INSERTION  04/15/2020  . IR THORACENTESIS ASP PLEURAL SPACE W/IMG GUIDE  04/15/2020  . MYOMECTOMY N/A 11/29/2016   Procedure: MYOMECTOMY;  Surgeon: Waymon Amato, MD;  Location: [facility];  Service: Gynecology;  Laterality: N/A;  :  Current Facility-Administered Medications  Medication Dose Route Frequency Provider Last Rate Last Admin  . Chlorhexidine Gluconate Cloth 2 % PADS 6 each  6 each Topical Daily Mikey Bussing  R, NP      . enoxaparin (LOVENOX) injection 40 mg  40 mg Subcutaneous Q24H Curcio, Kristin R, NP      . ondansetron (ZOFRAN) 8 mg in sodium chloride 0.9 % 50 mL IVPB  8 mg Intravenous Q8H PRN Curcio, Roselie Awkward, NP      . prochlorperazine (COMPAZINE) tablet 10 mg   10 mg Oral Q6H PRN Curcio, Kristin R, NP      . sodium chloride flush (NS) 0.9 % injection 10-40 mL  10-40 mL Intracatheter Q12H Curcio, Kristin R, NP      . sodium chloride flush (NS) 0.9 % injection 10-40 mL  10-40 mL Intracatheter PRN Curcio, Roselie Awkward, NP         No Known Allergies:  Family History  Problem Relation Age of Onset  . Diabetes Mother   . Hypertension Mother   :  Social History   Socioeconomic History  . Marital status: Married    Spouse name: Not on file  . Number of children: Not on file  . Years of education: Not on file  . Highest education level: Not on file  Occupational History  . Not on file  Tobacco Use  . Smoking status: Never Smoker  . Smokeless tobacco: Never Used  Substance and Sexual Activity  . Alcohol use: Yes    Comment: occ  . Drug use: No  . Sexual activity: Yes  Other Topics Concern  . Not on file  Social History Narrative  . Not on file   Social Determinants of Health   Financial Resource Strain:   . Difficulty of Paying Living Expenses:   Food Insecurity:   . Worried About Charity fundraiser in the Last Year:   . Arboriculturist in the Last Year:   Transportation Needs:   . Film/video editor (Medical):   Marland Kitchen Lack of Transportation (Non-Medical):   Physical Activity:   . Days of Exercise per Week:   . Minutes of Exercise per Session:   Stress:   . Feeling of Stress :   Social Connections:   . Frequency of Communication with Friends and Family:   . Frequency of Social Gatherings with Friends and Family:   . Attends Religious Services:   . Active Member of Clubs or Organizations:   . Attends Archivist Meetings:   Marland Kitchen Marital Status:   Intimate Partner Violence:   . Fear of Current or Ex-Partner:   . Emotionally Abused:   Marland Kitchen Physically Abused:   . Sexually Abused:   :  Review of Systems: A comprehensive 14 point review of systems was negative except as noted in the HPI.  Exam: Patient Vitals for the  past 24 hrs:  BP Temp Temp src Pulse Resp SpO2 Weight  04/18/20 1104 (!) 149/98 98 F (36.7 C) Oral 94 14 100 % 56.7 kg    General:  well-nourished in no acute distress.   Eyes:  no scleral icterus.   ENT:  There were no oropharyngeal lesions.   Neck was without thyromegaly.   Lymphatics:  Negative cervical, supraclavicular or axillary adenopathy.   Respiratory: Diminished to the bilateral bases. Cardiovascular:  Regular rate and rhythm, S1/S2, without murmur, rub or gallop.  There was no pedal edema.   GI:  abdomen was soft, flat, nontender, nondistended, without organomegaly.   Musculoskeletal:  no spinal tenderness of palpation of vertebral spine.   Skin: No bruising or petechiae.  Keloids noted over her chest  and abdomen. Neuro exam was nonfocal. Patient was alert and oriented.  Attention was good.   Language was appropriate.  Mood was normal without depression.  Speech was not pressured.  Thought content was not tangential.     Lab Results  Component Value Date   WBC 11.2 (H) 04/12/2020   HGB 10.3 (L) 04/12/2020   HCT 32.9 (L) 04/12/2020   PLT 667 (H) 04/12/2020   GLUCOSE 143 (H) 04/12/2020   ALT 13 04/12/2020   AST 28 04/12/2020   NA 141 04/12/2020   K 4.2 04/12/2020   CL 101 04/12/2020   CREATININE 0.85 04/12/2020   BUN 14 04/12/2020   CO2 27 04/12/2020    DG Chest 1 View  Result Date: 04/08/2020 CLINICAL DATA:  Post thoracentesis EXAM: CHEST  1 VIEW COMPARISON:  CT 04/07/2020 FINDINGS: Moderate to large left pleural effusion remains following thoracentesis. No pneumothorax. Right lung clear. Heart is normal size. No effusions. IMPRESSION: Moderate to large left effusion with left base atelectasis. No pneumothorax. Electronically Signed   By: Rolm Baptise M.D.   On: 04/08/2020 16:55   DG Chest 2 View  Result Date: 04/14/2020 CLINICAL DATA:  Shortness of breath EXAM: CHEST - 2 VIEW COMPARISON:  04/08/2020 FINDINGS: Moderate to large left pleural effusion, slightly  decreased since prior study. Left base atelectasis or infiltrate. Right lung clear. Heart is normal size. IMPRESSION: Moderate to large left effusion with left base atelectasis or infiltrate. Effusion slightly decreased since prior study. Electronically Signed   By: Rolm Baptise M.D.   On: 04/14/2020 14:39   DG Abd 1 View  Result Date: 04/14/2020 CLINICAL DATA:  Shortness of breath, abdominal swelling EXAM: ABDOMEN - 1 VIEW COMPARISON:  CT 04/06/2020 FINDINGS: Nonobstructive bowel gas pattern. No free air or suspicious calcification. Moderate stool burden throughout the colon. Prominent soft tissue density seen in the left upper abdomen corresponding to the previously seen large left upper quadrant mass involving the pancreas and spleen on prior CT. IMPRESSION: No evidence of bowel obstruction or free air. Prominent soft tissue density in the left upper quadrant corresponding to the large mass seen involving much of the pancreas and spleen on prior CT. Electronically Signed   By: Rolm Baptise M.D.   On: 04/14/2020 14:40   CT CHEST W CONTRAST  Result Date: 04/07/2020 CLINICAL DATA:  44 year old female with cancer of unknown primary. Pancreatic mass noted on the CT of the abdomen pelvis dated 04/06/2020. EXAM: CT CHEST WITH CONTRAST TECHNIQUE: Multidetector CT imaging of the chest was performed during intravenous contrast administration. CONTRAST:  25m OMNIPAQUE IOHEXOL 300 MG/ML  SOLN COMPARISON:  Chest CT dated 11/29/2017. FINDINGS: Cardiovascular: There is no cardiomegaly or pericardial effusion. The thoracic aorta is unremarkable. The origins of the great vessels of the aortic arch appear patent as visualized. Evaluation of the pulmonary arteries is very limited due to suboptimal opacification and timing of the contrast. No definite large central pulmonary artery embolus identified. V/Q scan may provide better evaluation if there is high clinical concern for acute PE. Mediastinum/Nodes: No hilar or  mediastinal adenopathy. The esophagus and the thyroid gland are grossly unremarkable. No mediastinal fluid collection. Lungs/Pleura: Large left pleural effusion with near complete compressive atelectasis of the left lower lobe and partial right upper lobe. Pneumonia is not excluded. Clinical correlation is recommended. The right lung is clear. There is no pneumothorax. There is slight shift of the mediastinum to the right of the midline. The central airways are patent. Upper Abdomen:  Large heterogeneous upper abdominal mass with invasion of the spleen. Musculoskeletal: No acute osseous pathology. IMPRESSION: 1. Large left pleural effusion with near complete compressive atelectasis of the left lower lobe and partial right upper lobe. Pneumonia is not excluded. Clinical correlation is recommended. Consider thoracentesis for symptomatic relief. 2. Large heterogeneous upper abdominal mass with invasion of the spleen. Electronically Signed   By: Anner Crete M.D.   On: 04/07/2020 23:31   CT ABDOMEN PELVIS W CONTRAST  Result Date: 04/06/2020 CLINICAL DATA:  Periodically epigastric pain EXAM: CT ABDOMEN AND PELVIS WITH CONTRAST TECHNIQUE: Multidetector CT imaging of the abdomen and pelvis was performed using the standard protocol following bolus administration of intravenous contrast. CONTRAST:  171m OMNIPAQUE IOHEXOL 300 MG/ML  SOLN COMPARISON:  None. FINDINGS: Lower chest: The visualized heart size within normal limits. No pericardial fluid/thickening. No hiatal hernia. There is a small to moderate left pleural effusion with basilar atelectasis. Hepatobiliary: The liver is normal in density without focal abnormality.The main portal vein is patent. No evidence of calcified gallstones, gallbladder wall thickening or biliary dilatation. Pancreas: There is a extensive heterogeneously enhancing mass involving nearly the entirety of the pancreatic body and tail. Only a small portion of the pancreatic head appears to be  spared. The SMV and SMA appear to be patent. No areas of internal necrosis seen within the mass. The mass appears to extend into the splenic hilum. Spleen: Extensive large multiple heterogeneously enhancing hypodense masses seen throughout the splenic parenchyma likely from the extension of the pancreatic mass which extends through the splenic hilum. There is diffuse splenomegaly. The splenic vein appears to be narrowed wall entering the splenic hilum. Adrenals/Urinary Tract: Both adrenal glands appear normal. The kidneys and collecting system appear normal without evidence of urinary tract calculus or hydronephrosis. Bladder is unremarkable. Stomach/Bowel: There appears to be diffuse wall thickening seen within the proximal stomach with with the heterogeneous mass seen encompassing the proximal portion. The remainder of the small-bowel and colon are unremarkable. There is a moderate amount of colonic stool present. Vascular/Lymphatic: There is a left periaortic nodule/lymph node causing mild displacement of the third portion of the duodenum measuring 2.8 x 2.7 cm. Scattered small left-sided periaortic lymph nodes are also noted. Reproductive: Extensively enlarged uterus with multiple hypodense fibroids are seen throughout as on a prior MRI dating back to 2017. Small amount of fluid within the endometrial canal. Other: Small amount of abdominopelvic ascites is seen. Musculoskeletal: No acute or significant osseous findings. 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 Retroperitoneal adenopathy, consistent with metastatic disease Diffuse wall thickening seen within the proximal stomach which could either be related to gastritis or possible metastatic disease. Small amount of abdominopelvic ascites  Enlarged uterus with multiple uterine fibroids These results were called by telephone at the time of interpretation on 04/06/2020 at 5:19 am to provider [facility], who verbally acknowledged these results. Electronically Signed   By: BPrudencio PairM.D.   On: 04/06/2020 05:19   NM PET Image Initial (PI) Skull Base To Thigh  Result Date: 04/15/2020 CLINICAL DATA:  Initial treatment strategy for diffuse large B-cell lymphoma. EXAM: NUCLEAR MEDICINE PET SKULL BASE TO THIGH TECHNIQUE: 6.4 mCi F-18 FDG was injected intravenously. Full-ring PET imaging was performed from the skull base to thigh after the radiotracer. CT data was obtained and used  for attenuation correction and anatomic localization. Fasting blood glucose: 101 mg/dl COMPARISON:  CT scan 04/06/2020 FINDINGS: Mediastinal blood pool activity: SUV max 1.84 Liver activity: SUV max 2.61 NECK: No hypermetabolic lymph nodes in the neck. Incidental CT findings: none CHEST: No hypermetabolic mediastinal or hilar nodes. No suspicious pulmonary nodules on the CT scan. No hypermetabolic breast lesions, supraclavicular or axillary adenopathy. Incidental CT findings: Persistent left pleural effusion and overlying left lower lobe atelectasis. ABDOMEN/PELVIS: Large left upper quadrant abdominal mass involving the spleen, stomach and retroperitoneum is markedly hypermetabolic with SUV max of 27.25. Associated contralateral retroperitoneal lymphadenopathy is hypermetabolic with SUV max of 36.64. No pelvic lymphadenopathy. No inguinal lymphadenopathy. No worrisome hepatic lesions. Incidental CT findings: Markedly enlarged fibroid uterus is noted. Simple appearing cyst associated with the left ovary. Subcutaneous lesions involving the anterior abdominal wall. One is near the umbilicus. The other is overlying the mid pelvis. These have the appearance of some type of skin blister. Recommend clinical correlation. Mild hypermetabolism could suggest an inflammatory process.  Given patient's age IV status Kaposi's sarcoma would be another possibility. SKELETON: No findings suspicious for osseous metastatic disease. Incidental CT findings: none IMPRESSION: 1. Large left upper quadrant mass is markedly hypermetabolic and consistent with patient's known lymphoma. 2. No findings to suggest involvement of the neck, chest or pelvis or bony structures. 3. Enlarged fibroid uterus. 4. Midline anterior abdominal skin lesions. Mild hypermetabolism. These could reflect some type of skin blisters. I suppose Kaposi's sarcoma would be another consideration given the patient's HIV status. Electronically Signed   By: Marijo Sanes M.D.   On: 04/15/2020 14:20   CT BIOPSY  Result Date: 04/08/2020 INDICATION: 44 year old female with pancreatic mass versus peripancreatic adenopathy and associated involvement of the spleen. Findings are concerning for possible lymphoma. Patient presents for CT-guided core biopsy to obtain tissue diagnosis. EXAM: CT-guided core biopsy MEDICATIONS: None. ANESTHESIA/SEDATION: Moderate (conscious) sedation was employed during this procedure. A total of Versed 2 mg and Fentanyl 100 mcg was administered intravenously. Moderate Sedation Time: 12 minutes. The patient's level of consciousness and vital signs were monitored continuously by radiology nursing throughout the procedure under my direct supervision. FLUOROSCOPY TIME:  None. COMPLICATIONS: None immediate. PROCEDURE: Informed written consent was obtained from the patient after a thorough discussion of the procedural risks, benefits and alternatives. All questions were addressed. A timeout was performed prior to the initiation of the procedure. A planning axial CT scan was performed. The mass was identified. A suitable skin entry site was selected and marked. Local anesthesia was attained by infiltration with 1% lidocaine. A small dermatotomy was made. Under intermittent CT guidance, a 17 gauge introducer needle was  carefully advanced into the margin of the mass. Multiple 18 gauge core biopsies were then obtained coaxially using the bio Pince automated biopsy device. Biopsy specimens were placed in saline and delivered to pathology for further analysis. Gel-Foam was injected through the introducer needle as it was removed. Post biopsy CT imaging demonstrates no evidence of hematoma or other immediate complication. The patient tolerated the procedure well. IMPRESSION: Technically successful CT-guided biopsy of pancreatic mass versus peripancreatic lymphadenopathy. Electronically Signed   By: Jacqulynn Cadet M.D.   On: 04/08/2020 16:32   IR IMAGING GUIDED PORT INSERTION  Result Date: 04/15/2020 INDICATION: History of diffuse large B-cell lymphoma. In need of durable intravenous access for chemotherapy administration. EXAM: IMPLANTED PORT A CATH PLACEMENT WITH ULTRASOUND AND FLUOROSCOPIC GUIDANCE COMPARISON:  Chest CT-04/07/2020 MEDICATIONS: Ancef 2 gm IV; The antibiotic was administered  within an appropriate time interval prior to skin puncture. ANESTHESIA/SEDATION: Moderate (conscious) sedation was employed during this procedure. A total of Versed 4 mg and Fentanyl 100 mcg was administered intravenously. Moderate Sedation Time: 26 minutes. The patient's level of consciousness and vital signs were monitored continuously by radiology nursing throughout the procedure under my direct supervision. CONTRAST:  None FLUOROSCOPY TIME:  30 seconds (5 mGy) COMPLICATIONS: None immediate. PROCEDURE: The procedure, risks, benefits, and alternatives were explained to the patient. Questions regarding the procedure were encouraged and answered. The patient understands and consents to the procedure. The right neck and chest were prepped with chlorhexidine in a sterile fashion, and a sterile drape was applied covering the operative field. Maximum barrier sterile technique with sterile gowns and gloves were used for the procedure. A timeout  was performed prior to the initiation of the procedure. Local anesthesia was provided with 1% lidocaine with epinephrine. After creating a small venotomy incision, a micropuncture kit was utilized to access the internal jugular vein. Real-time ultrasound guidance was utilized for vascular access including the acquisition of a permanent ultrasound image documenting patency of the accessed vessel. The microwire was utilized to measure appropriate catheter length. A subcutaneous port pocket was then created along the upper chest wall utilizing a combination of sharp and blunt dissection. The pocket was irrigated with sterile saline. A single lumen "ISP" sized power injectable port was chosen for placement. The 8 Fr catheter was tunneled from the port pocket site to the venotomy incision. The port was placed in the pocket. The external catheter was trimmed to appropriate length. At the venotomy, an 8 Fr peel-away sheath was placed over a guidewire under fluoroscopic guidance. The catheter was then placed through the sheath and the sheath was removed. Final catheter positioning was confirmed and documented with a fluoroscopic spot radiograph. The port was accessed with a Huber needle, aspirated and flushed with heparinized saline. The venotomy site was closed with an interrupted 4-0 Vicryl suture. The port pocket incision was closed with interrupted 2-0 Vicryl suture. The skin was opposed with a running subcuticular 4-0 Vicryl suture. Dermabond and Steri-strips were applied to both incisions. Dressings were applied. The patient tolerated the procedure well without immediate post procedural complication. FINDINGS: After catheter placement, the tip lies within the superior cavoatrial junction. The catheter aspirates and flushes normally and is ready for immediate use. IMPRESSION: Successful placement of a right internal jugular approach power injectable Port-A-Cath. The catheter is ready for immediate use. Electronically  Signed   By: Sandi Mariscal M.D.   On: 04/15/2020 15:28   IR THORACENTESIS ASP PLEURAL SPACE W/IMG GUIDE  Result Date: 04/15/2020 INDICATION: Recent diagnosis diffuse large B-cell lymphoma, now with recurrent left-sided pleural effusion. Please perform ultrasound-guided thoracentesis for therapeutic purposes prior to PET-CT. EXAM: IR THORACENTESIS ASP PLEURAL SPACE W/IMG GUIDE COMPARISON:  Chest CT-04/07/2020 MEDICATIONS: None. COMPLICATIONS: None immediate. TECHNIQUE: Informed written consent was obtained from the patient after a discussion of the risks, benefits and alternatives to treatment. A timeout was performed prior to the initiation of the procedure. Initial ultrasound scanning demonstrates a small left-sided anechoic pleural effusion. The lower chest was prepped and draped in the usual sterile fashion. 1% lidocaine was used for local anesthesia. An ultrasound image was saved for documentation purposes. An 8 Fr Safe-T-Centesis catheter was introduced. The thoracentesis was performed. The catheter was removed and a dressing was applied. The patient tolerated the procedure well without immediate post procedural complication. The patient was escorted to have an  upright chest radiograph. FINDINGS: A total of approximately 200 cc of orange colored pleural fluid was removed. IMPRESSION: Successful ultrasound-guided left sided thoracentesis yielding 200 cc orange colored pleural fluid. Electronically Signed   By: Sandi Mariscal M.D.   On: 04/15/2020 16:18   US THORACENTESIS ASP PLEURAL SPACE W/IMG GUIDE  Result Date: 04/08/2020 INDICATION: Patient with history of HIV, pancreatic mass, retroperitoneal adenopathy, ascites, splenic masses, left pleural effusion. Request received for diagnostic and therapeutic left thoracentesis. EXAM: ULTRASOUND GUIDED DIAGNOSTIC AND THERAPEUTIC LEFT THORACENTESIS MEDICATIONS: None COMPLICATIONS: None immediate. PROCEDURE: An ultrasound guided thoracentesis was thoroughly discussed  with the patient and questions answered. The benefits, risks, alternatives and complications were also discussed. The patient understands and wishes to proceed with the procedure. Written consent was obtained. Ultrasound was performed to localize and mark an adequate pocket of fluid in the left chest. The area was then prepped and draped in the normal sterile fashion. 1% Lidocaine was used for local anesthesia. Under ultrasound guidance a 6 Fr Safe-T-Centesis catheter was introduced. Thoracentesis was performed. The catheter was removed and a dressing applied. FINDINGS: A total of approximately 1.5 liters of blood-tinged fluid was removed. Samples were sent to the laboratory as requested by the clinical team. Due to patient chest discomfort and coughing only the above amount of fluid was removed today. IMPRESSION: Successful ultrasound guided diagnostic and therapeutic left thoracentesis yielding 1.5 liters of pleural fluid. Read by: Rowe Robert, PA-C Electronically Signed   By: Jacqulynn Cadet M.D.   On: 04/08/2020 16:37     DG Chest 1 View  Result Date: 04/08/2020 CLINICAL DATA:  Post thoracentesis EXAM: CHEST  1 VIEW COMPARISON:  CT 04/07/2020 FINDINGS: Moderate to large left pleural effusion remains following thoracentesis. No pneumothorax. Right lung clear. Heart is normal size. No effusions. IMPRESSION: Moderate to large left effusion with left base atelectasis. No pneumothorax. Electronically Signed   By: Rolm Baptise M.D.   On: 04/08/2020 16:55   DG Chest 2 View  Result Date: 04/14/2020 CLINICAL DATA:  Shortness of breath EXAM: CHEST - 2 VIEW COMPARISON:  04/08/2020 FINDINGS: Moderate to large left pleural effusion, slightly decreased since prior study. Left base atelectasis or infiltrate. Right lung clear. Heart is normal size. IMPRESSION: Moderate to large left effusion with left base atelectasis or infiltrate. Effusion slightly decreased since prior study. Electronically Signed   By: Rolm Baptise M.D.   On: 04/14/2020 14:39   DG Abd 1 View  Result Date: 04/14/2020 CLINICAL DATA:  Shortness of breath, abdominal swelling EXAM: ABDOMEN - 1 VIEW COMPARISON:  CT 04/06/2020 FINDINGS: Nonobstructive bowel gas pattern. No free air or suspicious calcification. Moderate stool burden throughout the colon. Prominent soft tissue density seen in the left upper abdomen corresponding to the previously seen large left upper quadrant mass involving the pancreas and spleen on prior CT. IMPRESSION: No evidence of bowel obstruction or free air. Prominent soft tissue density in the left upper quadrant corresponding to the large mass seen involving much of the pancreas and spleen on prior CT. Electronically Signed   By: Rolm Baptise M.D.   On: 04/14/2020 14:40   CT CHEST W CONTRAST  Result Date: 04/07/2020 CLINICAL DATA:  44 year old female with cancer of unknown primary. Pancreatic mass noted on the CT of the abdomen pelvis dated 04/06/2020. EXAM: CT CHEST WITH CONTRAST TECHNIQUE: Multidetector CT imaging of the chest was performed during intravenous contrast administration. CONTRAST:  41m OMNIPAQUE IOHEXOL 300 MG/ML  SOLN COMPARISON:  Chest CT dated  11/29/2017. FINDINGS: Cardiovascular: There is no cardiomegaly or pericardial effusion. The thoracic aorta is unremarkable. The origins of the great vessels of the aortic arch appear patent as visualized. Evaluation of the pulmonary arteries is very limited due to suboptimal opacification and timing of the contrast. No definite large central pulmonary artery embolus identified. V/Q scan may provide better evaluation if there is high clinical concern for acute PE. Mediastinum/Nodes: No hilar or mediastinal adenopathy. The esophagus and the thyroid gland are grossly unremarkable. No mediastinal fluid collection. Lungs/Pleura: Large left pleural effusion with near complete compressive atelectasis of the left lower lobe and partial right upper lobe. Pneumonia is not  excluded. Clinical correlation is recommended. The right lung is clear. There is no pneumothorax. There is slight shift of the mediastinum to the right of the midline. The central airways are patent. Upper Abdomen: Large heterogeneous upper abdominal mass with invasion of the spleen. Musculoskeletal: No acute osseous pathology. IMPRESSION: 1. Large left pleural effusion with near complete compressive atelectasis of the left lower lobe and partial right upper lobe. Pneumonia is not excluded. Clinical correlation is recommended. Consider thoracentesis for symptomatic relief. 2. Large heterogeneous upper abdominal mass with invasion of the spleen. Electronically Signed   By: Anner Crete M.D.   On: 04/07/2020 23:31   CT ABDOMEN PELVIS W CONTRAST  Result Date: 04/06/2020 CLINICAL DATA:  Periodically epigastric pain EXAM: CT ABDOMEN AND PELVIS WITH CONTRAST TECHNIQUE: Multidetector CT imaging of the abdomen and pelvis was performed using the standard protocol following bolus administration of intravenous contrast. CONTRAST:  131m OMNIPAQUE IOHEXOL 300 MG/ML  SOLN COMPARISON:  None. FINDINGS: Lower chest: The visualized heart size within normal limits. No pericardial fluid/thickening. No hiatal hernia. There is a small to moderate left pleural effusion with basilar atelectasis. Hepatobiliary: The liver is normal in density without focal abnormality.The main portal vein is patent. No evidence of calcified gallstones, gallbladder wall thickening or biliary dilatation. Pancreas: There is a extensive heterogeneously enhancing mass involving nearly the entirety of the pancreatic body and tail. Only a small portion of the pancreatic head appears to be spared. The SMV and SMA appear to be patent. No areas of internal necrosis seen within the mass. The mass appears to extend into the splenic hilum. Spleen: Extensive large multiple heterogeneously enhancing hypodense masses seen throughout the splenic parenchyma likely  from the extension of the pancreatic mass which extends through the splenic hilum. There is diffuse splenomegaly. The splenic vein appears to be narrowed wall entering the splenic hilum. Adrenals/Urinary Tract: Both adrenal glands appear normal. The kidneys and collecting system appear normal without evidence of urinary tract calculus or hydronephrosis. Bladder is unremarkable. Stomach/Bowel: There appears to be diffuse wall thickening seen within the proximal stomach with with the heterogeneous mass seen encompassing the proximal portion. The remainder of the small-bowel and colon are unremarkable. There is a moderate amount of colonic stool present. Vascular/Lymphatic: There is a left periaortic nodule/lymph node causing mild displacement of the third portion of the duodenum measuring 2.8 x 2.7 cm. Scattered small left-sided periaortic lymph nodes are also noted. Reproductive: Extensively enlarged uterus with multiple hypodense fibroids are seen throughout as on a prior MRI dating back to 2017. Small amount of fluid within the endometrial canal. Other: Small amount of abdominopelvic ascites is seen. Musculoskeletal: No acute or significant osseous findings. 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 Retroperitoneal adenopathy, consistent with metastatic disease Diffuse wall thickening seen within the proximal stomach which could either be related to gastritis or possible metastatic disease. Small amount of abdominopelvic ascites Enlarged uterus with multiple uterine fibroids These results were called by telephone at the time of interpretation on 04/06/2020 at 5:19 am to provider [facility] , who verbally acknowledged these results. Electronically Signed   By: Prudencio Pair M.D.   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 EXAM: NUCLEAR MEDICINE PET SKULL BASE TO THIGH TECHNIQUE: 6.4 mCi F-18 FDG was injected intravenously. Full-ring PET imaging was performed from the skull base to thigh after the radiotracer. CT data was obtained and used for attenuation correction and anatomic localization. Fasting blood glucose: 101 mg/dl COMPARISON:  CT scan 04/06/2020 FINDINGS: Mediastinal blood pool activity: SUV max 1.84 Liver activity: SUV max 2.61 NECK: No hypermetabolic lymph nodes in the neck. Incidental CT findings: none CHEST: No hypermetabolic mediastinal or hilar nodes. No suspicious pulmonary nodules on the CT scan. No hypermetabolic breast lesions, supraclavicular or axillary adenopathy. Incidental CT findings: Persistent left pleural effusion and overlying left lower lobe atelectasis. ABDOMEN/PELVIS: Large left upper quadrant abdominal mass involving the spleen, stomach and retroperitoneum is markedly hypermetabolic with SUV max of 38.10. Associated contralateral retroperitoneal lymphadenopathy is hypermetabolic with SUV max of 17.51. No pelvic lymphadenopathy. No inguinal lymphadenopathy. No worrisome hepatic lesions. Incidental CT findings: Markedly enlarged fibroid uterus is noted. Simple appearing cyst associated with the left ovary. Subcutaneous lesions involving the anterior abdominal wall. One is near the umbilicus. The other is overlying the mid pelvis. These have the appearance of some type of skin blister. Recommend clinical correlation. Mild hypermetabolism could suggest an inflammatory process. Given patient's age IV status Kaposi's sarcoma would be another possibility. SKELETON: No findings suspicious for osseous metastatic disease. Incidental CT findings: none IMPRESSION: 1. Large left upper quadrant mass is markedly hypermetabolic and consistent with patient's  known lymphoma. 2. No findings to suggest involvement of the neck, chest or pelvis or bony structures. 3. Enlarged fibroid uterus. 4. Midline anterior abdominal skin lesions. Mild hypermetabolism. These could reflect some type of skin blisters. I suppose Kaposi's sarcoma would be another consideration given the patient's HIV status. Electronically Signed   By: Marijo Sanes M.D.   On: 04/15/2020 14:20   CT BIOPSY  Result Date: 04/08/2020 INDICATION: 44 year old female with pancreatic mass versus peripancreatic adenopathy and associated involvement of the spleen. Findings are concerning for possible lymphoma. Patient presents for CT-guided core biopsy to obtain tissue diagnosis. EXAM: CT-guided core biopsy MEDICATIONS: None. ANESTHESIA/SEDATION: Moderate (conscious) sedation was employed during this procedure. A total of Versed 2 mg and Fentanyl 100 mcg was administered intravenously. Moderate Sedation Time: 12 minutes. The patient's level of consciousness and vital signs were monitored continuously by radiology nursing throughout the procedure under my direct supervision. FLUOROSCOPY TIME:  None. COMPLICATIONS: None immediate. PROCEDURE: Informed written consent was obtained from the patient after a thorough discussion of the procedural risks, benefits and alternatives. All questions were addressed. A timeout was performed prior to the initiation of the procedure. A planning axial CT scan was performed. The mass was identified. A suitable skin entry site was selected and marked. Local anesthesia was attained by infiltration with 1% lidocaine. A small dermatotomy was made. Under intermittent CT guidance, a 17 gauge introducer  needle was carefully advanced into the margin of the mass. Multiple 18 gauge core biopsies were then obtained coaxially using the bio Pince automated biopsy device. Biopsy specimens were placed in saline and delivered to pathology for further analysis. Gel-Foam was injected through the  introducer needle as it was removed. Post biopsy CT imaging demonstrates no evidence of hematoma or other immediate complication. The patient tolerated the procedure well. IMPRESSION: Technically successful CT-guided biopsy of pancreatic mass versus peripancreatic lymphadenopathy. Electronically Signed   By: Jacqulynn Cadet M.D.   On: 04/08/2020 16:32   IR IMAGING GUIDED PORT INSERTION  Result Date: 04/15/2020 INDICATION: History of diffuse large B-cell lymphoma. In need of durable intravenous access for chemotherapy administration. EXAM: IMPLANTED PORT A CATH PLACEMENT WITH ULTRASOUND AND FLUOROSCOPIC GUIDANCE COMPARISON:  Chest CT-04/07/2020 MEDICATIONS: Ancef 2 gm IV; The antibiotic was administered within an appropriate time interval prior to skin puncture. ANESTHESIA/SEDATION: Moderate (conscious) sedation was employed during this procedure. A total of Versed 4 mg and Fentanyl 100 mcg was administered intravenously. Moderate Sedation Time: 26 minutes. The patient's level of consciousness and vital signs were monitored continuously by radiology nursing throughout the procedure under my direct supervision. CONTRAST:  None FLUOROSCOPY TIME:  30 seconds (5 mGy) COMPLICATIONS: None immediate. PROCEDURE: The procedure, risks, benefits, and alternatives were explained to the patient. Questions regarding the procedure were encouraged and answered. The patient understands and consents to the procedure. The right neck and chest were prepped with chlorhexidine in a sterile fashion, and a sterile drape was applied covering the operative field. Maximum barrier sterile technique with sterile gowns and gloves were used for the procedure. A timeout was performed prior to the initiation of the procedure. Local anesthesia was provided with 1% lidocaine with epinephrine. After creating a small venotomy incision, a micropuncture kit was utilized to access the internal jugular vein. Real-time ultrasound guidance was utilized  for vascular access including the acquisition of a permanent ultrasound image documenting patency of the accessed vessel. The microwire was utilized to measure appropriate catheter length. A subcutaneous port pocket was then created along the upper chest wall utilizing a combination of sharp and blunt dissection. The pocket was irrigated with sterile saline. A single lumen "ISP" sized power injectable port was chosen for placement. The 8 Fr catheter was tunneled from the port pocket site to the venotomy incision. The port was placed in the pocket. The external catheter was trimmed to appropriate length. At the venotomy, an 8 Fr peel-away sheath was placed over a guidewire under fluoroscopic guidance. The catheter was then placed through the sheath and the sheath was removed. Final catheter positioning was confirmed and documented with a fluoroscopic spot radiograph. The port was accessed with a Huber needle, aspirated and flushed with heparinized saline. The venotomy site was closed with an interrupted 4-0 Vicryl suture. The port pocket incision was closed with interrupted 2-0 Vicryl suture. The skin was opposed with a running subcuticular 4-0 Vicryl suture. Dermabond and Steri-strips were applied to both incisions. Dressings were applied. The patient tolerated the procedure well without immediate post procedural complication. FINDINGS: After catheter placement, the tip lies within the superior cavoatrial junction. The catheter aspirates and flushes normally and is ready for immediate use. IMPRESSION: Successful placement of a right internal jugular approach power injectable Port-A-Cath. The catheter is ready for immediate use. Electronically Signed   By: Sandi Mariscal M.D.   On: 04/15/2020 15:28   IR THORACENTESIS ASP PLEURAL SPACE W/IMG GUIDE  Result Date: 04/15/2020 INDICATION:  Recent diagnosis diffuse large B-cell lymphoma, now with recurrent left-sided pleural effusion. Please perform ultrasound-guided  thoracentesis for therapeutic purposes prior to PET-CT. EXAM: IR THORACENTESIS ASP PLEURAL SPACE W/IMG GUIDE COMPARISON:  Chest CT-04/07/2020 MEDICATIONS: None. COMPLICATIONS: None immediate. TECHNIQUE: Informed written consent was obtained from the patient after a discussion of the risks, benefits and alternatives to treatment. A timeout was performed prior to the initiation of the procedure. Initial ultrasound scanning demonstrates a small left-sided anechoic pleural effusion. The lower chest was prepped and draped in the usual sterile fashion. 1% lidocaine was used for local anesthesia. An ultrasound image was saved for documentation purposes. An 8 Fr Safe-T-Centesis catheter was introduced. The thoracentesis was performed. The catheter was removed and a dressing was applied. The patient tolerated the procedure well without immediate post procedural complication. The patient was escorted to have an upright chest radiograph. FINDINGS: A total of approximately 200 cc of orange colored pleural fluid was removed. IMPRESSION: Successful ultrasound-guided left sided thoracentesis yielding 200 cc orange colored pleural fluid. Electronically Signed   By: Sandi Mariscal M.D.   On: 04/15/2020 16:18   US THORACENTESIS ASP PLEURAL SPACE W/IMG GUIDE  Result Date: 04/08/2020 INDICATION: Patient with history of HIV, pancreatic mass, retroperitoneal adenopathy, ascites, splenic masses, left pleural effusion. Request received for diagnostic and therapeutic left thoracentesis. EXAM: ULTRASOUND GUIDED DIAGNOSTIC AND THERAPEUTIC LEFT THORACENTESIS MEDICATIONS: None COMPLICATIONS: None immediate. PROCEDURE: An ultrasound guided thoracentesis was thoroughly discussed with the patient and questions answered. The benefits, risks, alternatives and complications were also discussed. The patient understands and wishes to proceed with the procedure. Written consent was obtained. Ultrasound was performed to localize and mark an adequate  pocket of fluid in the left chest. The area was then prepped and draped in the normal sterile fashion. 1% Lidocaine was used for local anesthesia. Under ultrasound guidance a 6 Fr Safe-T-Centesis catheter was introduced. Thoracentesis was performed. The catheter was removed and a dressing applied. FINDINGS: A total of approximately 1.5 liters of blood-tinged fluid was removed. Samples were sent to the laboratory as requested by the clinical team. Due to patient chest discomfort and coughing only the above amount of fluid was removed today. IMPRESSION: Successful ultrasound guided diagnostic and therapeutic left thoracentesis yielding 1.5 liters of pleural fluid. Read by: Rowe Robert, PA-C Electronically Signed   By: Jacqulynn Cadet M.D.   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 Hemoglobin C], childhood malaria. Patient notes that she had a CT of the abdomen sometime in 2020 that showed no acute pathology.This was not [facility]. Not accessible to [place] at this point. She did have an MRI of the abdomen in July 2019 which showed indeterminate splenic lesions.No other acute abdominal pathology.No concern with hepatocellular carcinoma.  Patient is presenting now with  #1Pancreatic massalong with retroperitoneal lymphadenopathy and diffuse splenic lesions. No internal necrosis within the mass noted. Diffuse splenomegaly. Left periaortic lymph node causing mild displacement of the third portion of the duodenum.  Significantly elevated LDH levels. 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  # 2 Left sided large pleural effusion and lung  atelectasis. S/p diagnostic and therapeutic thoracentesis -- lymphocytic predominance @ 80%  HIV could be risk factors  for high grade EBV driven lymphomas  Patient symptomatology has developed rather quickly over the last few weeks.  #8mcrocytic anemia-chronic.Some element of iron deficiency versus hemoglobinopathy versus anemia of chronic disease related to malignancy. Patient has received 1 dose of IV Feraheme on 04/08/2020 She is due for a repeat dose and may consider giving this as an inpatient later this week  #4history of HIV/AIDS [facility]   PLAN: -PET scan results were reviewed today. -Repeat CBC with differential and CMET have been ordered for today.  Will review, but anticipate that we will go ahead with day 1 of cycle one of her chemotherapy today as planned. -Stat echocardiogram ordered to evaluate left ventricular ejection fraction. -Will need a daily CBC with differential, CMET, uric acid, and phosphorus. -Begin Allopurinol 100 mg BID -As needed antiemetics have been ordered. -Will need to be cautious with rituximab to prevent recurrent hepatitis B flare.  Repeat HBV DNA undetectable on 04/13/2020. -Will coordinate care with her Infectious Disease Physician to prevent any drug interactions with Biktarvy -Will consider Intrathecal Methotrexate for CNS prophylaxis with future cycles   KMikey Bussing DNP, AGPCNP-BC, AOCNP   ADDENDUM  .Patient was Personally and independently interviewed, examined and relevant elements of the history of present illness were reviewed in details and an assessment and plan was created. All elements of the patient's history of present illness , assessment and plan were discussed in details with KMikey Bussing DNP, AGPCNP-BC, AOCNP. The above documentation reflects our combined findings assessment and plan.  ECHO nl EF HBV PCR undetectable. Labs stable Proceeding with  C1 of EPOCH-R. Outpatient Rituxan and udenyca on 6/21 Monitoring for TLS Allopurinol 101mpo bid Continue Biktarvy for ART and Hep B suppression. Confirmed no significant drug interactions with EPOCH-R with pharmacy.  GaSullivan LoneD MS

## 2020-04-18 NOTE — Progress Notes (Signed)
  Echocardiogram 2D Echocardiogram has been performed.  Michiel Cowboy 04/18/2020, 11:55 AM

## 2020-04-19 DIAGNOSIS — Z5111 Encounter for antineoplastic chemotherapy: Secondary | ICD-10-CM

## 2020-04-19 DIAGNOSIS — Z9189 Other specified personal risk factors, not elsewhere classified: Secondary | ICD-10-CM

## 2020-04-19 DIAGNOSIS — E440 Moderate protein-calorie malnutrition: Secondary | ICD-10-CM | POA: Insufficient documentation

## 2020-04-19 LAB — CBC WITH DIFFERENTIAL/PLATELET
Abs Immature Granulocytes: 0.2 10*3/uL — ABNORMAL HIGH (ref 0.00–0.07)
Basophils Absolute: 0 10*3/uL (ref 0.0–0.1)
Basophils Relative: 0 %
Eosinophils Absolute: 0 10*3/uL (ref 0.0–0.5)
Eosinophils Relative: 0 %
HCT: 30.2 % — ABNORMAL LOW (ref 36.0–46.0)
Hemoglobin: 9.4 g/dL — ABNORMAL LOW (ref 12.0–15.0)
Immature Granulocytes: 2 %
Lymphocytes Relative: 2 %
Lymphs Abs: 0.3 10*3/uL — ABNORMAL LOW (ref 0.7–4.0)
MCH: 22.2 pg — ABNORMAL LOW (ref 26.0–34.0)
MCHC: 31.1 g/dL (ref 30.0–36.0)
MCV: 71.2 fL — ABNORMAL LOW (ref 80.0–100.0)
Monocytes Absolute: 1.3 10*3/uL — ABNORMAL HIGH (ref 0.1–1.0)
Monocytes Relative: 11 %
Neutro Abs: 10.2 10*3/uL — ABNORMAL HIGH (ref 1.7–7.7)
Neutrophils Relative %: 85 %
Platelets: 526 10*3/uL — ABNORMAL HIGH (ref 150–400)
RBC: 4.24 MIL/uL (ref 3.87–5.11)
RDW: 20.3 % — ABNORMAL HIGH (ref 11.5–15.5)
WBC: 11.9 10*3/uL — ABNORMAL HIGH (ref 4.0–10.5)
nRBC: 0 % (ref 0.0–0.2)

## 2020-04-19 LAB — COMPREHENSIVE METABOLIC PANEL
ALT: 13 U/L (ref 0–44)
AST: 27 U/L (ref 15–41)
Albumin: 2.8 g/dL — ABNORMAL LOW (ref 3.5–5.0)
Alkaline Phosphatase: 76 U/L (ref 38–126)
Anion gap: 12 (ref 5–15)
BUN: 14 mg/dL (ref 6–20)
CO2: 25 mmol/L (ref 22–32)
Calcium: 9.8 mg/dL (ref 8.9–10.3)
Chloride: 105 mmol/L (ref 98–111)
Creatinine, Ser: 0.66 mg/dL (ref 0.44–1.00)
GFR calc Af Amer: >60 mL/min (ref >60)
GFR calc non Af Amer: >60 mL/min (ref >60)
Glucose, Bld: 109 mg/dL — ABNORMAL HIGH (ref 70–99)
Potassium: 3.8 mmol/L (ref 3.5–5.1)
Sodium: 142 mmol/L (ref 135–145)
Total Bilirubin: 0.7 mg/dL (ref 0.3–1.2)
Total Protein: 6.3 g/dL — ABNORMAL LOW (ref 6.5–8.1)

## 2020-04-19 LAB — PHOSPHORUS: Phosphorus: 3.5 mg/dL (ref 2.5–4.6)

## 2020-04-19 LAB — URIC ACID: Uric Acid, Serum: 5.3 mg/dL (ref 2.5–7.1)

## 2020-04-19 MED ORDER — ENSURE ENLIVE PO LIQD
237.0000 mL | Freq: Two times a day (BID) | ORAL | Status: DC
  Administered 2020-04-19 – 2020-04-22 (×4): 237 mL via ORAL

## 2020-04-19 MED ORDER — SODIUM CHLORIDE 0.9 % IV SOLN
Freq: Once | INTRAVENOUS | Status: AC
  Administered 2020-04-19: 18 mg via INTRAVENOUS
  Filled 2020-04-19: qty 4

## 2020-04-19 MED ORDER — ACETAMINOPHEN 325 MG PO TABS: 650.0000 mg | ORAL_TABLET | ORAL | Status: DC | PRN

## 2020-04-19 MED ORDER — POLYETHYLENE GLYCOL 3350 17 G PO PACK
17.0000 g | PACK | Freq: Every day | ORAL | Status: DC
  Administered 2020-04-19 – 2020-04-22 (×3): 17 g via ORAL
  Filled 2020-04-19 (×3): qty 1

## 2020-04-19 MED ORDER — IBUPROFEN 200 MG PO TABS: 600.0000 mg | ORAL_TABLET | Freq: Four times a day (QID) | ORAL | Status: DC | PRN

## 2020-04-19 MED ORDER — VINCRISTINE SULFATE CHEMO INJECTION 1 MG/ML
Freq: Once | INTRAVENOUS | Status: AC
  Filled 2020-04-19: qty 8

## 2020-04-19 MED ORDER — BICTEGRAVIR-EMTRICITAB-TENOFOV 50-200-25 MG PO TABS
1.0000 | ORAL_TABLET | Freq: Every day | ORAL | Status: DC
  Administered 2020-04-20 – 2020-04-22 (×3): 1 via ORAL
  Filled 2020-04-19 (×3): qty 1

## 2020-04-19 NOTE — Progress Notes (Addendum)
HEMATOLOGY-ONCOLOGY PROGRESS NOTE  SUBJECTIVE: Already day 1 of cycle one of her chemotherapy well overall.  She had some mild abdominal discomfort overnight and required ibuprofen.  Pain is adequately controlled at this time.  Reports that pain overall is better.  She is not having any nausea or vomiting this morning.  Bowels are moving a small amount.  Denies chest pain and shortness of breath.  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9 % 250 mL chemo infusion, 750 mg/m2 = 1,180 mg, Intravenous,  Once, 1 of 4 cycles  for chemotherapy treatment.    04/25/2020 -  Chemotherapy   The patient had pegfilgrastim-cbqv (UDENYCA) injection 6 mg, 6 mg, Subcutaneous, Once, 0 of 4 cycles riTUXimab-pvvr (RUXIENCE) 600 mg in sodium chloride 0.9 % 250 mL (1.9355 mg/mL) infusion, 375 mg/m2 = 600 mg, Intravenous,  Once, 0 of 4 cycles  for chemotherapy treatment.       REVIEW OF SYSTEMS:   Constitutional: Denies fevers, chills  Ears, nose, mouth, throat, and face: Denies mucositis or sore throat Respiratory: Denies cough, dyspnea or wheezes Cardiovascular: Denies palpitation, chest discomfort Gastrointestinal: Intermittent abdominal discomfort, denies nausea vomiting, reports  only a small amount of stool. Skin: Has keloids to her chest and abdomen Lymphatics: Denies new lymphadenopathy or easy bruising Neurological:Denies numbness, tingling or new weaknesses Behavioral/Psych: Mood is stable, no new changes  Extremities: No lower extremity edema All other systems were reviewed with the patient and are negative.  I have reviewed the past medical history, past surgical history, social history and family history with the patient and they are unchanged from previous note.   PHYSICAL EXAMINATION: ECOG PERFORMANCE STATUS: 1 - Symptomatic but completely ambulatory  Vitals:   04/19/20 0004 04/19/20 0541  BP: (!) 145/90 (!) 153/93  Pulse: 84 88  Resp: 18 14  Temp: 98.3 F (36.8 C)   SpO2: 98% 97%   Filed Weights   04/18/20 1104  Weight: 56.7 kg    Intake/Output from previous day: 06/14 0701 - 06/15 0700 In: 602 [P.O.:360; I.V.:68; IV Piggyback:174] Out: -   GENERAL:alert, no distress and comfortable EYES: normal, Conjunctiva are pink and non-injected, sclera clear OROPHARYNX:no exudate, no erythema and lips, buccal mucosa, and tongue normal  NECK: supple, thyroid normal size, non-tender, without nodularity LYMPH:  no palpable lymphadenopathy in the cervical, axillary or inguinal LUNGS: Diminished to the bilateral bases HEART: regular rate & rhythm and no murmurs and no lower extremity edema ABDOMEN:abdomen soft, non-tender and normal bowel sounds Musculoskeletal:no cyanosis of digits and no clubbing  NEURO: alert & oriented x 3 with fluent speech, no focal motor/sensory deficits  LABORATORY DATA:  I have reviewed the data as listed CMP Latest Ref Rng & Units 04/19/2020 04/18/2020 04/12/2020  Glucose 70 - 99 mg/dL 109(H)  119(H) 143(H)  BUN 6 - 20 mg/dL _0 Creatinine 0.44 - 1.00 mg/dL 0.66 0.81 0.85  Sodium 135 - 145 mmol/L 142 138 141  Potassium 3.5 - 5.1 mmol/L 3.8 3.9 4.2  Chloride 98 - 111 mmol/L 105 101 101  CO2 22 - 32 mmol/L _1 Calcium 8.9 - 10.3 mg/dL 9.8 10.5(H) 10.7(H)  Total Protein 6.5 - 8.1 g/dL 6.3(L) 7.2 7.5  Total Bilirubin 0.3 - 1.2 mg/dL 0.7 0.7 0.3  Alkaline Phos 38 - 126 U/L 76 88 99  AST 15 - 41 U/L 27 34 28  ALT 0 - 44 U/L _2 Lab Results  Component Value Date   WBC 11.9 (H) 04/19/2020   HGB 9.4 (L) 04/19/2020   HCT 30.2 (L) 04/19/2020   MCV 71.2 (L) 04/19/2020   PLT 526 (H) 04/19/2020   NEUTROABS 10.2 (H) 04/19/2020    DG Chest 1 View  Result Date: 04/08/2020 CLINICAL DATA:  Post thoracentesis EXAM: CHEST  1 VIEW COMPARISON:  CT 04/07/2020 FINDINGS: Moderate to large left pleural effusion remains following thoracentesis. No pneumothorax. Right lung clear. Heart is normal size. No effusions. IMPRESSION: Moderate to large left effusion with left base atelectasis. No pneumothorax. Electronically Signed   By: Rolm Baptise M.D.   On: 04/08/2020 16:55   DG Chest 2 View  Result Date: 04/14/2020 CLINICAL DATA:  Shortness of breath EXAM: CHEST - 2 VIEW COMPARISON:  04/08/2020 FINDINGS: Moderate to large left pleural effusion, slightly decreased since prior study. Left base atelectasis or infiltrate. Right lung clear. Heart is normal size. IMPRESSION: Moderate to large left effusion with left base atelectasis or infiltrate. Effusion slightly decreased since prior study. Electronically Signed   By: Rolm Baptise M.D.   On: 04/14/2020 14:39   DG Abd 1 View  Result Date: 04/14/2020 CLINICAL DATA:  Shortness of breath, abdominal swelling EXAM: ABDOMEN - 1 VIEW COMPARISON:  CT 04/06/2020 FINDINGS: Nonobstructive bowel gas pattern. No free air or suspicious calcification. Moderate stool burden throughout the colon. Prominent soft tissue density seen in the left upper abdomen corresponding to the previously seen large left upper quadrant mass involving the pancreas and spleen on prior CT. IMPRESSION: No evidence of bowel obstruction or free air. Prominent soft tissue density in the left upper quadrant  corresponding to the large mass seen involving much of the pancreas and spleen on prior CT. Electronically Signed   By: Rolm Baptise M.D.   On: 04/14/2020 14:40   CT CHEST W CONTRAST  Result Date: 04/07/2020 CLINICAL DATA:  44 year old female with cancer of unknown primary. Pancreatic mass noted on the CT of the abdomen pelvis dated 04/06/2020. EXAM: CT CHEST WITH CONTRAST TECHNIQUE: Multidetector CT imaging of the chest was performed during intravenous contrast administration. CONTRAST:  57m OMNIPAQUE IOHEXOL 300 MG/ML  SOLN COMPARISON:  Chest CT dated 11/29/2017. FINDINGS: Cardiovascular: There is no cardiomegaly or pericardial effusion. The thoracic aorta is unremarkable. The origins of the great vessels of the aortic arch appear patent as visualized. Evaluation of the pulmonary arteries is very limited due to suboptimal opacification and timing of the contrast. No definite large central pulmonary artery embolus identified. V/Q scan may provide better evaluation if there is high clinical concern for acute PE. Mediastinum/Nodes: No hilar or mediastinal adenopathy. The esophagus and the thyroid gland are grossly unremarkable. No mediastinal fluid collection. Lungs/Pleura: Large left pleural effusion with near complete compressive atelectasis of the left lower lobe and  partial right upper lobe. Pneumonia is not excluded. Clinical correlation is recommended. The right lung is clear. There is no pneumothorax. There is slight shift of the mediastinum to the right of the midline. The central airways are patent. Upper Abdomen: Large heterogeneous upper abdominal mass with invasion of the spleen. Musculoskeletal: No acute osseous pathology. IMPRESSION: 1. Large left pleural effusion with near complete compressive atelectasis of the left lower lobe and partial right upper lobe. Pneumonia is not excluded. Clinical correlation is recommended. Consider thoracentesis for symptomatic relief. 2. Large heterogeneous upper  abdominal mass with invasion of the spleen. Electronically Signed   By: Anner Crete M.D.   On: 04/07/2020 23:31   CT ABDOMEN PELVIS W CONTRAST  Result Date: 04/06/2020 CLINICAL DATA:  Periodically epigastric pain EXAM: CT ABDOMEN AND PELVIS WITH CONTRAST TECHNIQUE: Multidetector CT imaging of the abdomen and pelvis was performed using the standard protocol following bolus administration of intravenous contrast. CONTRAST:  163m OMNIPAQUE IOHEXOL 300 MG/ML  SOLN COMPARISON:  None. FINDINGS: Lower chest: The visualized heart size within normal limits. No pericardial fluid/thickening. No hiatal hernia. There is a small to moderate left pleural effusion with basilar atelectasis. Hepatobiliary: The liver is normal in density without focal abnormality.The main portal vein is patent. No evidence of calcified gallstones, gallbladder wall thickening or biliary dilatation. Pancreas: There is a extensive heterogeneously enhancing mass involving nearly the entirety of the pancreatic body and tail. Only a small portion of the pancreatic head appears to be spared. The SMV and SMA appear to be patent. No areas of internal necrosis seen within the mass. The mass appears to extend into the splenic hilum. Spleen: Extensive large multiple heterogeneously enhancing hypodense masses seen throughout the splenic parenchyma likely from the extension of the pancreatic mass which extends through the splenic hilum. There is diffuse splenomegaly. The splenic vein appears to be narrowed wall entering the splenic hilum. Adrenals/Urinary Tract: Both adrenal glands appear normal. The kidneys and collecting system appear normal without evidence of urinary tract calculus or hydronephrosis. Bladder is unremarkable. Stomach/Bowel: There appears to be diffuse wall thickening seen within the proximal stomach with with the heterogeneous mass seen encompassing the proximal portion. The remainder of the small-bowel and colon are unremarkable. There  is a moderate amount of colonic stool present. Vascular/Lymphatic: There is a left periaortic nodule/lymph node causing mild displacement of the third portion of the duodenum measuring 2.8 x 2.7 cm. Scattered small left-sided periaortic lymph nodes are also noted. Reproductive: Extensively enlarged uterus with multiple hypodense fibroids are seen throughout as on a prior MRI dating back to 2017. Small amount of fluid within the endometrial canal. Other: Small amount of abdominopelvic ascites is seen. Musculoskeletal: No acute or significant osseous findings. 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 Retroperitoneal adenopathy, consistent with metastatic disease Diffuse wall thickening seen within the proximal stomach which could either be related to gastritis or possible metastatic disease. Small amount of abdominopelvic ascites Enlarged uterus with multiple uterine fibroids These results were called by telephone at the time of interpretation on 04/06/2020 at 5:19 am [facility] verbally acknowledged these results. Electronically Signed   By: BPrudencio PairM.D.   On: 04/06/2020 05:19   NM PET Image Initial (PI) Skull Base To Thigh  Result Date: 04/15/2020 CLINICAL DATA:  Initial treatment strategy  for diffuse large B-cell lymphoma. EXAM: NUCLEAR MEDICINE PET SKULL BASE TO THIGH TECHNIQUE: 6.4 mCi F-18 FDG was injected intravenously. Full-ring PET imaging was performed from the skull base to thigh after the radiotracer. CT data was obtained and used for attenuation correction and anatomic localization. Fasting blood glucose: 101 mg/dl COMPARISON:  CT scan 04/06/2020 FINDINGS: Mediastinal blood pool activity: SUV max 1.84 Liver activity: SUV max 2.61 NECK: No hypermetabolic lymph  nodes in the neck. Incidental CT findings: none CHEST: No hypermetabolic mediastinal or hilar nodes. No suspicious pulmonary nodules on the CT scan. No hypermetabolic breast lesions, supraclavicular or axillary adenopathy. Incidental CT findings: Persistent left pleural effusion and overlying left lower lobe atelectasis. ABDOMEN/PELVIS: Large left upper quadrant abdominal mass involving the spleen, stomach and retroperitoneum is markedly hypermetabolic with SUV max of 65.03. Associated contralateral retroperitoneal lymphadenopathy is hypermetabolic with SUV max of 54.65. No pelvic lymphadenopathy. No inguinal lymphadenopathy. No worrisome hepatic lesions. Incidental CT findings: Markedly enlarged fibroid uterus is noted. Simple appearing cyst associated with the left ovary. Subcutaneous lesions involving the anterior abdominal wall. One is near the umbilicus. The other is overlying the mid pelvis. These have the appearance of some type of skin blister. Recommend clinical correlation. Mild hypermetabolism could suggest an inflammatory process. Given patient's age IV status Kaposi's sarcoma would be another possibility. SKELETON: No findings suspicious for osseous metastatic disease. Incidental CT findings: none IMPRESSION: 1. Large left upper quadrant mass is markedly hypermetabolic and consistent with patient's known lymphoma. 2. No findings to suggest involvement of the neck, chest or pelvis or bony structures. 3. Enlarged fibroid uterus. 4. Midline anterior abdominal skin lesions. Mild hypermetabolism. These could reflect some type of skin blisters. I suppose Kaposi's sarcoma would be another consideration given the patient's HIV status. Electronically Signed   By: Marijo Sanes M.D.   On: 04/15/2020 14:20   CT BIOPSY  Result Date: 04/08/2020 INDICATION: 44 year old female with pancreatic mass versus peripancreatic adenopathy and associated involvement of the spleen. Findings are concerning for possible  lymphoma. Patient presents for CT-guided core biopsy to obtain tissue diagnosis. EXAM: CT-guided core biopsy MEDICATIONS: None. ANESTHESIA/SEDATION: Moderate (conscious) sedation was employed during this procedure. A total of Versed 2 mg and Fentanyl 100 mcg was administered intravenously. Moderate Sedation Time: 12 minutes. The patient's level of consciousness and vital signs were monitored continuously by radiology nursing throughout the procedure under my direct supervision. FLUOROSCOPY TIME:  None. COMPLICATIONS: None immediate. PROCEDURE: Informed written consent was obtained from the patient after a thorough discussion of the procedural risks, benefits and alternatives. All questions were addressed. A timeout was performed prior to the initiation of the procedure. A planning axial CT scan was performed. The mass was identified. A suitable skin entry site was selected and marked. Local anesthesia was attained by infiltration with 1% lidocaine. A small dermatotomy was made. Under intermittent CT guidance, a 17 gauge introducer needle was carefully advanced into the margin of the mass. Multiple 18 gauge core biopsies were then obtained coaxially using the bio Pince automated biopsy device. Biopsy specimens were placed in saline and delivered to pathology for further analysis. Gel-Foam was injected through the introducer needle as it was removed. Post biopsy CT imaging demonstrates no evidence of hematoma or other immediate complication. The patient tolerated the procedure well. IMPRESSION: Technically successful CT-guided biopsy of pancreatic mass versus peripancreatic lymphadenopathy. Electronically Signed   By: Jacqulynn Cadet M.D.   On: 04/08/2020 16:32   ECHOCARDIOGRAM COMPLETE  Result Date: 04/18/2020  ECHOCARDIOGRAM REPORT   Patient Name:   TRAYCE CARAVELLO Date of Exam: 04/18/2020 Medical Rec #:  [id number]       Height:       60.0 in Accession #:    [id number]      Weight:       127.0 lb Date of  Birth:  [birth year]        BSA:          1.539 m Patient Age:    65 years        BP:           142/98 mmHg Patient Gender: F               HR:           91 bpm. Exam Location:  Inpatient Procedure: 2D Echo, Cardiac Doppler, Color Doppler and Strain Analysis STAT ECHO Indications:    Chemotherapy evaluation v87.41 / v58.11  History:        Patient has no prior history of Echocardiogram examinations.                 Risk Factors:Non-Smoker. Pleural effusion.  Sonographer:    Vickie Epley RDCS Referring Phys: [place]  1. Left ventricular ejection fraction, by estimation, is 60 to 65%. The left ventricle has normal function. The left ventricle has no regional wall motion abnormalities. Left ventricular diastolic parameters were normal. The average left ventricular global longitudinal strain is -22.3 %.  2. Right ventricular systolic function is normal. The right ventricular size is normal. Tricuspid regurgitation signal is inadequate for assessing PA pressure.  3. The mitral valve is normal in structure. Trivial mitral valve regurgitation.  4. The aortic valve is tricuspid. Aortic valve regurgitation is not visualized. No aortic stenosis is present.  5. The inferior vena cava is normal in size with greater than 50% respiratory variability, suggesting right atrial pressure of 3 mmHg. FINDINGS  Left Ventricle: Left ventricular ejection fraction, by estimation, is 60 to 65%. The left ventricle has normal function. The left ventricle has no regional wall motion abnormalities. The average left ventricular global longitudinal strain is -22.3 %. The left ventricular internal cavity size was normal in size. There is no left ventricular hypertrophy. Left ventricular diastolic parameters were normal. Right Ventricle: The right ventricular size is normal. Right vetricular wall thickness was not assessed. Right ventricular systolic function is normal. Tricuspid regurgitation signal is inadequate for assessing  PA pressure. Left Atrium: Left atrial size was normal in size. Right Atrium: Right atrial size was normal in size. Pericardium: Trivial pericardial effusion is present. Mitral Valve: The mitral valve is normal in structure. Trivial mitral valve regurgitation. Tricuspid Valve: The tricuspid valve is normal in structure. Tricuspid valve regurgitation is not demonstrated. Aortic Valve: The aortic valve is tricuspid. Aortic valve regurgitation is not visualized. No aortic stenosis is present. Pulmonic Valve: The pulmonic valve was not well visualized. Pulmonic valve regurgitation is not visualized. Aorta: The aortic root is normal in size and structure. Venous: The inferior vena cava is normal in size with greater than 50% respiratory variability, suggesting right atrial pressure of 3 mmHg. IAS/Shunts: The interatrial septum was not well visualized. Additional Comments: There is pleural effusion in the left lateral region.  LEFT VENTRICLE PLAX 2D LVIDd:         4.00 cm     Diastology LVIDs:         2.80 cm     LV e' lateral:  12.40 cm/s LV PW:         0.70 cm     LV E/e' lateral: 7.3 LV IVS:        0.70 cm     LV e' medial:    8.70 cm/s LVOT diam:     1.60 cm     LV E/e' medial:  10.4 LV SV:         36 LV SV Index:   23          2D Longitudinal Strain LVOT Area:     2.01 cm    2D Strain GLS Avg:     -22.3 %  LV Volumes (MOD) LV vol d, MOD A2C: 83.6 ml LV vol d, MOD A4C: 74.0 ml LV vol s, MOD A2C: 33.0 ml LV vol s, MOD A4C: 37.0 ml LV SV MOD A2C:     50.6 ml LV SV MOD A4C:     74.0 ml LV SV MOD BP:      43.6 ml RIGHT VENTRICLE RV S prime:     13.80 cm/s TAPSE (M-mode): 2.2 cm LEFT ATRIUM             Index       RIGHT ATRIUM          Index LA diam:        4.00 cm 2.60 cm/m  RA Area:     7.07 cm LA Vol (A2C):   17.7 ml 11.50 ml/m RA Volume:   9.87 ml  6.41 ml/m LA Vol (A4C):   26.1 ml 16.96 ml/m LA Biplane Vol: 23.4 ml 15.20 ml/m  AORTIC VALVE LVOT Vmax:   94.60 cm/s LVOT Vmean:  59.100 cm/s LVOT VTI:    0.177 m   AORTA Ao Root diam: 2.90 cm MITRAL VALVE MV Area (PHT): 4.80 cm    SHUNTS MV Decel Time: 158 msec    Systemic VTI:  0.18 m MV E velocity: 90.80 cm/s  Systemic Diam: 1.60 cm MV A velocity: 85.90 cm/s MV E/A ratio:  1.06 Oswaldo Milian MD Electronically signed by Oswaldo Milian MD Signature Date/Time: 04/18/2020/12:07:09 PM    Final    IR IMAGING GUIDED PORT INSERTION  Result Date: 04/15/2020 INDICATION: History of diffuse large B-cell lymphoma. In need of durable intravenous access for chemotherapy administration. EXAM: IMPLANTED PORT A CATH PLACEMENT WITH ULTRASOUND AND FLUOROSCOPIC GUIDANCE COMPARISON:  Chest CT-04/07/2020 MEDICATIONS: Ancef 2 gm IV; The antibiotic was administered within an appropriate time interval prior to skin puncture. ANESTHESIA/SEDATION: Moderate (conscious) sedation was employed during this procedure. A total of Versed 4 mg and Fentanyl 100 mcg was administered intravenously. Moderate Sedation Time: 26 minutes. The patient's level of consciousness and vital signs were monitored continuously by radiology nursing throughout the procedure under my direct supervision. CONTRAST:  None FLUOROSCOPY TIME:  30 seconds (5 mGy) COMPLICATIONS: None immediate. PROCEDURE: The procedure, risks, benefits, and alternatives were explained to the patient. Questions regarding the procedure were encouraged and answered. The patient understands and consents to the procedure. The right neck and chest were prepped with chlorhexidine in a sterile fashion, and a sterile drape was applied covering the operative field. Maximum barrier sterile technique with sterile gowns and gloves were used for the procedure. A timeout was performed prior to the initiation of the procedure. Local anesthesia was provided with 1% lidocaine with epinephrine. After creating a small venotomy incision, a micropuncture kit was utilized to access the internal jugular vein. Real-time ultrasound guidance was utilized for  vascular access including the acquisition of a permanent ultrasound image documenting patency of the accessed vessel. The microwire was utilized to measure appropriate catheter length. A subcutaneous port pocket was then created along the upper chest wall utilizing a combination of sharp and blunt dissection. The pocket was irrigated with sterile saline. A single lumen "ISP" sized power injectable port was chosen for placement. The 8 Fr catheter was tunneled from the port pocket site to the venotomy incision. The port was placed in the pocket. The external catheter was trimmed to appropriate length. At the venotomy, an 8 Fr peel-away sheath was placed over a guidewire under fluoroscopic guidance. The catheter was then placed through the sheath and the sheath was removed. Final catheter positioning was confirmed and documented with a fluoroscopic spot radiograph. The port was accessed with a Huber needle, aspirated and flushed with heparinized saline. The venotomy site was closed with an interrupted 4-0 Vicryl suture. The port pocket incision was closed with interrupted 2-0 Vicryl suture. The skin was opposed with a running subcuticular 4-0 Vicryl suture. Dermabond and Steri-strips were applied to both incisions. Dressings were applied. The patient tolerated the procedure well without immediate post procedural complication. FINDINGS: After catheter placement, the tip lies within the superior cavoatrial junction. The catheter aspirates and flushes normally and is ready for immediate use. IMPRESSION: Successful placement of a right internal jugular approach power injectable Port-A-Cath. The catheter is ready for immediate use. Electronically Signed   By: Sandi Mariscal M.D.   On: 04/15/2020 15:28   IR THORACENTESIS ASP PLEURAL SPACE W/IMG GUIDE  Result Date: 04/15/2020 INDICATION: Recent diagnosis diffuse large B-cell lymphoma, now with recurrent left-sided pleural effusion. Please perform ultrasound-guided  thoracentesis for therapeutic purposes prior to PET-CT. EXAM: IR THORACENTESIS ASP PLEURAL SPACE W/IMG GUIDE COMPARISON:  Chest CT-04/07/2020 MEDICATIONS: None. COMPLICATIONS: None immediate. TECHNIQUE: Informed written consent was obtained from the patient after a discussion of the risks, benefits and alternatives to treatment. A timeout was performed prior to the initiation of the procedure. Initial ultrasound scanning demonstrates a small left-sided anechoic pleural effusion. The lower chest was prepped and draped in the usual sterile fashion. 1% lidocaine was used for local anesthesia. An ultrasound image was saved for documentation purposes. An 8 Fr Safe-T-Centesis catheter was introduced. The thoracentesis was performed. The catheter was removed and a dressing was applied. The patient tolerated the procedure well without immediate post procedural complication. The patient was escorted to have an upright chest radiograph. FINDINGS: A total of approximately 200 cc of orange colored pleural fluid was removed. IMPRESSION: Successful ultrasound-guided left sided thoracentesis yielding 200 cc orange colored pleural fluid. Electronically Signed   By: Sandi Mariscal M.D.   On: 04/15/2020 16:18   US THORACENTESIS ASP PLEURAL SPACE W/IMG GUIDE  Result Date: 04/08/2020 INDICATION: Patient with history of HIV, pancreatic mass, retroperitoneal adenopathy, ascites, splenic masses, left pleural effusion. Request received for diagnostic and therapeutic left thoracentesis. EXAM: ULTRASOUND GUIDED DIAGNOSTIC AND THERAPEUTIC LEFT THORACENTESIS MEDICATIONS: None COMPLICATIONS: None immediate. PROCEDURE: An ultrasound guided thoracentesis was thoroughly discussed with the patient and questions answered. The benefits, risks, alternatives and complications were also discussed. The patient understands and wishes to proceed with the procedure. Written consent was obtained. Ultrasound was performed to localize and mark an adequate  pocket of fluid in the left chest. The area was then prepped and draped in the normal sterile fashion. 1% Lidocaine was used for local anesthesia. Under ultrasound guidance a 6 Fr Safe-T-Centesis catheter was introduced. Thoracentesis was  performed. The catheter was removed and a dressing applied. FINDINGS: A total of approximately 1.5 liters of blood-tinged fluid was removed. Samples were sent to the laboratory as requested by the clinical team. Due to patient chest discomfort and coughing only the above amount of fluid was removed today. IMPRESSION: Successful ultrasound guided diagnostic and therapeutic left thoracentesis yielding 1.5 liters of pleural fluid. Read by: Rowe Robert, PA-C Electronically Signed   By: Jacqulynn Cadet M.D.   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 Patient notes that she had a CT of the abdomen sometime in 2020 that showed no acute pathology.This was not [facility] Not accessible to [place] at this point. She did have an MRI of the abdomen in July 2019 which showed indeterminate splenic lesions.No other acute abdominal pathology.No concern with hepatocellular carcinoma.  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 No internal necrosis within the mass noted. Diffuse splenomegaly. Left periaortic lymph node causing mild displacement of the third portion of the duodenum.  Significantly elevated LDH levels. 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  # 2 Left sided large pleural effusion and lung atelectasis. S/p diagnostic and therapeutic thoracentesis -- lymphocytic predominance @ 80%  HIV could be risk factors for high grade EBV driven lymphomas  Patient symptomatology has developed rather quickly over the last few weeks.  #32mcrocytic anemia-chronic.Some element of iron deficiency versus hemoglobinopathy versus anemia of chronic disease related to malignancy. Patient has received 1 dose of IV Feraheme on 04/08/2020 She is due for a repeat dose and may consider giving this as an inpatient later this week  #4history of HIV/AIDS follows with [facility] K[facility]da [facility]   PLAN: -Labs from this morning have been reviewed and are within normal limits except for WBC 11.9, hemoglobin 9.4, hematocrit 30.2, MCV 71.2, MCH 22.2, RDW 20.3, platelets 526,000, ANC 10.2, absolute lymphocytes 0.3, absolute monocytes 1.3, glucose 109, total protein 6.3, and albumin 2.8.  Her uric acid level and phosphorus level were normal. -We will follow CBC with differential, CMET, uric acid, and phosphorus levels daily. -Baseline echocardiogram showed an LVEF of 60 to 65%. -Continue allopurinol 100 mg twice a day. -Continue as needed antiemetics. -I have ordered MiraLAX 17 g daily. -I have ordered as needed Tylenol and ibuprofen for the patient for her abdominal pain.  She does not want anything stronger. -Will need to be cautious with rituximab to prevent recurrent hepatitis B flare.  Repeat HBV DNA undetectable on 04/13/2020. -Will coordinate care with her Infectious Disease Physician to prevent any drug interactions with Biktarvy -Will consider Intrathecal Methotrexate for CNS prophylaxis with future cycles    LOS: 1 day   KMikey Bussing DNP, AGPCNP-BC, AOCNP 04/19/20    ADDENDUM  .Patient was Personally and independently interviewed, examined and relevant  elements of the history of present illness were reviewed in details and an assessment and plan was created. All elements of the patient's history of present illness , assessment and plan were discussed in details with KMikey Bussing DNP, AGPCNP-BC,  AOCNP. The above documentation reflects our combined findings assessment and plan.  Sullivan Lone MD MS

## 2020-04-19 NOTE — Progress Notes (Signed)
Initial Nutrition Assessment  DOCUMENTATION CODES:   Non-severe (moderate) malnutrition in context of chronic illness  INTERVENTION:   -Ensure Enlive po BID, each supplement provides 350 kcal and 20 grams of protein -Daily snack -2pm  NUTRITION DIAGNOSIS:   Moderate Malnutrition related to chronic illness, cancer and cancer related treatments as evidenced by percent weight loss, energy intake < 75% for > 7 days, mild muscle depletion.  GOAL:   Patient will meet greater than or equal to 90% of their needs  MONITOR:   PO intake, Supplement acceptance, Labs, Weight trends, I & O's  REASON FOR ASSESSMENT:   Consult Assessment of nutrition requirement/status  ASSESSMENT:   44 y.o. female with a past medical history significant for hepatitis B, HIV, chronic anemia who presented to [place] with abdominal pain.  She had a 2-week history of early satiety, poor appetite and, 5 pound weight loss.  Abdominal pain is typically postprandial and mainly at night after dinner while laying in bed.  Has been associated with nausea improves after vomiting.  Patient in room, in good spirits. Pt reports her appetite and PO intake has fluctuated recently. States on a good day she tries to eat at least 3 times a day but not consuming full meals. Pt has been trying to consume smoothies and snacks. Interested in trying protein shakes while admitted, RD to order Ensure. This morning pt consumed some grits, cream of wheat and bacon, denies nausea at this time. Pt is also interested in receiving daily snack of grapes and cheese, RD to order.  Pt reports some off tastes with certain foods. Denies issues with chewing/swallowing.  Pt reports UBW of 142 lbs. She has seen a steady decrease in her weight recently. Per weight records, pt has lost 8 lbs since 6/2 (6% wt loss x 13 days, significant for time frame).  Medications: Vitamin D, Multivitamin with minerals daily Labs reviewed: CBGs:  101  NUTRITION - FOCUSED PHYSICAL EXAM:    Most Recent Value  Orbital Region No depletion  Upper Arm Region No depletion  Thoracic and Lumbar Region Unable to assess  Buccal Region No depletion  Temple Region Mild depletion  Clavicle Bone Region Mild depletion  Clavicle and Acromion Bone Region Mild depletion  Scapular Bone Region No depletion  Dorsal Hand No depletion  Patellar Region No depletion  Anterior Thigh Region No depletion  Posterior Calf Region No depletion  Edema (RD Assessment) None       Diet Order:   Diet Order            Diet regular Room service appropriate? Yes; Fluid consistency: Thin  Diet effective now                 EDUCATION NEEDS:   Education needs have been addressed  Skin:  Skin Assessment: Reviewed RN Assessment  Last BM:  6/14  Height:   Ht Readings from Last 1 Encounters:  04/18/20 5' (1.524 m)    Weight:   Wt Readings from Last 1 Encounters:  04/18/20 56.7 kg   BMI:  Body mass index is 24.43 kg/m.  Estimated Nutritional Needs:   Kcal:  7408-1448  Protein:  85-100g  Fluid:  1.8L/day   Clayton Bibles, MS, RD, LDN Inpatient Clinical Dietitian Contact information available via Amion

## 2020-04-20 ENCOUNTER — Inpatient Hospital Stay: Payer: 59

## 2020-04-20 ENCOUNTER — Inpatient Hospital Stay (HOSPITAL_COMMUNITY): Admission: RE | Admit: 2020-04-20 | Payer: 59 | Source: Ambulatory Visit

## 2020-04-20 LAB — CBC WITH DIFFERENTIAL/PLATELET
Abs Immature Granulocytes: 0.07 10*3/uL (ref 0.00–0.07)
Basophils Absolute: 0 10*3/uL (ref 0.0–0.1)
Basophils Relative: 0 %
Eosinophils Absolute: 0 10*3/uL (ref 0.0–0.5)
Eosinophils Relative: 0 %
HCT: 30.6 % — ABNORMAL LOW (ref 36.0–46.0)
Hemoglobin: 9.3 g/dL — ABNORMAL LOW (ref 12.0–15.0)
Immature Granulocytes: 1 %
Lymphocytes Relative: 2 %
Lymphs Abs: 0.2 10*3/uL — ABNORMAL LOW (ref 0.7–4.0)
MCH: 21.8 pg — ABNORMAL LOW (ref 26.0–34.0)
MCHC: 30.4 g/dL (ref 30.0–36.0)
MCV: 71.8 fL — ABNORMAL LOW (ref 80.0–100.0)
Monocytes Absolute: 0.9 10*3/uL (ref 0.1–1.0)
Monocytes Relative: 7 %
Neutro Abs: 11.6 10*3/uL — ABNORMAL HIGH (ref 1.7–7.7)
Neutrophils Relative %: 90 %
Platelets: 506 10*3/uL — ABNORMAL HIGH (ref 150–400)
RBC: 4.26 MIL/uL (ref 3.87–5.11)
RDW: 20.5 % — ABNORMAL HIGH (ref 11.5–15.5)
WBC: 12.8 10*3/uL — ABNORMAL HIGH (ref 4.0–10.5)
nRBC: 0 % (ref 0.0–0.2)

## 2020-04-20 LAB — COMPREHENSIVE METABOLIC PANEL
ALT: 15 U/L (ref 0–44)
AST: 30 U/L (ref 15–41)
Albumin: 2.9 g/dL — ABNORMAL LOW (ref 3.5–5.0)
Alkaline Phosphatase: 74 U/L (ref 38–126)
Anion gap: 13 (ref 5–15)
BUN: 17 mg/dL (ref 6–20)
CO2: 26 mmol/L (ref 22–32)
Calcium: 9.9 mg/dL (ref 8.9–10.3)
Chloride: 98 mmol/L (ref 98–111)
Creatinine, Ser: 0.5 mg/dL (ref 0.44–1.00)
GFR calc Af Amer: >60 mL/min (ref >60)
GFR calc non Af Amer: >60 mL/min (ref >60)
Glucose, Bld: 220 mg/dL — ABNORMAL HIGH (ref 70–99)
Potassium: 3.5 mmol/L (ref 3.5–5.1)
Sodium: 137 mmol/L (ref 135–145)
Total Bilirubin: 0.2 mg/dL — ABNORMAL LOW (ref 0.3–1.2)
Total Protein: 6.3 g/dL — ABNORMAL LOW (ref 6.5–8.1)

## 2020-04-20 LAB — URIC ACID: Uric Acid, Serum: 4.7 mg/dL (ref 2.5–7.1)

## 2020-04-20 LAB — PHOSPHORUS: Phosphorus: 3 mg/dL (ref 2.5–4.6)

## 2020-04-20 MED ORDER — SODIUM BICARBONATE/SODIUM CHLORIDE MOUTHWASH
Freq: Four times a day (QID) | OROMUCOSAL | Status: DC
  Filled 2020-04-20 (×2): qty 1000

## 2020-04-20 MED ORDER — SODIUM CHLORIDE 0.9 % IV SOLN
Freq: Once | INTRAVENOUS | Status: AC
  Administered 2020-04-20: 8 mg via INTRAVENOUS
  Filled 2020-04-20: qty 4

## 2020-04-20 MED ORDER — VINCRISTINE SULFATE CHEMO INJECTION 1 MG/ML
Freq: Once | INTRAVENOUS | Status: AC
  Filled 2020-04-20: qty 8

## 2020-04-21 ENCOUNTER — Telehealth: Payer: Self-pay

## 2020-04-21 LAB — CBC WITH DIFFERENTIAL/PLATELET
Abs Immature Granulocytes: 0.08 10*3/uL — ABNORMAL HIGH (ref 0.00–0.07)
Basophils Absolute: 0 10*3/uL (ref 0.0–0.1)
Basophils Relative: 0 %
Eosinophils Absolute: 0 10*3/uL (ref 0.0–0.5)
Eosinophils Relative: 0 %
HCT: 30.8 % — ABNORMAL LOW (ref 36.0–46.0)
Hemoglobin: 9.6 g/dL — ABNORMAL LOW (ref 12.0–15.0)
Immature Granulocytes: 1 %
Lymphocytes Relative: 2 %
Lymphs Abs: 0.2 10*3/uL — ABNORMAL LOW (ref 0.7–4.0)
MCH: 22.2 pg — ABNORMAL LOW (ref 26.0–34.0)
MCHC: 31.2 g/dL (ref 30.0–36.0)
MCV: 71.1 fL — ABNORMAL LOW (ref 80.0–100.0)
Monocytes Absolute: 0.2 10*3/uL (ref 0.1–1.0)
Monocytes Relative: 2 %
Neutro Abs: 8.6 10*3/uL — ABNORMAL HIGH (ref 1.7–7.7)
Neutrophils Relative %: 95 %
Platelets: 461 10*3/uL — ABNORMAL HIGH (ref 150–400)
RBC: 4.33 MIL/uL (ref 3.87–5.11)
RDW: 20.3 % — ABNORMAL HIGH (ref 11.5–15.5)
WBC: 9.1 10*3/uL (ref 4.0–10.5)
nRBC: 0 % (ref 0.0–0.2)

## 2020-04-21 LAB — COMPREHENSIVE METABOLIC PANEL
ALT: 14 U/L (ref 0–44)
AST: 33 U/L (ref 15–41)
Albumin: 2.9 g/dL — ABNORMAL LOW (ref 3.5–5.0)
Alkaline Phosphatase: 75 U/L (ref 38–126)
Anion gap: 12 (ref 5–15)
BUN: 17 mg/dL (ref 6–20)
CO2: 26 mmol/L (ref 22–32)
Calcium: 9.7 mg/dL (ref 8.9–10.3)
Chloride: 98 mmol/L (ref 98–111)
Creatinine, Ser: 0.59 mg/dL (ref 0.44–1.00)
GFR calc Af Amer: >60 mL/min (ref >60)
GFR calc non Af Amer: >60 mL/min (ref >60)
Glucose, Bld: 114 mg/dL — ABNORMAL HIGH (ref 70–99)
Potassium: 3.4 mmol/L — ABNORMAL LOW (ref 3.5–5.1)
Sodium: 136 mmol/L (ref 135–145)
Total Bilirubin: 0.5 mg/dL (ref 0.3–1.2)
Total Protein: 6.3 g/dL — ABNORMAL LOW (ref 6.5–8.1)

## 2020-04-21 LAB — URIC ACID: Uric Acid, Serum: 4.7 mg/dL (ref 2.5–7.1)

## 2020-04-21 LAB — PHOSPHORUS: Phosphorus: 3.5 mg/dL (ref 2.5–4.6)

## 2020-04-21 MED ORDER — PROCHLORPERAZINE MALEATE 10 MG PO TABS: 10.0000 mg | ORAL_TABLET | Freq: Four times a day (QID) | ORAL | 1 refills | Status: DC | PRN

## 2020-04-21 MED ORDER — ALLOPURINOL 100 MG PO TABS: 100.0000 mg | ORAL_TABLET | Freq: Two times a day (BID) | ORAL | 1 refills | Status: DC

## 2020-04-21 MED ORDER — ACETAMINOPHEN 325 MG PO TABS: 650.0000 mg | ORAL_TABLET | ORAL | Status: DC | PRN

## 2020-04-21 MED ORDER — VINCRISTINE SULFATE CHEMO INJECTION 1 MG/ML
Freq: Once | INTRAVENOUS | Status: AC
  Filled 2020-04-21: qty 8

## 2020-04-21 MED ORDER — LORAZEPAM 0.5 MG PO TABS
0.5000 mg | ORAL_TABLET | Freq: Four times a day (QID) | ORAL | 0 refills | Status: DC | PRN
Start: 2020-04-21 — End: 2022-06-15

## 2020-04-21 MED ORDER — SODIUM CHLORIDE 0.9 % IV SOLN
Freq: Once | INTRAVENOUS | Status: AC
  Administered 2020-04-21: 18 mg via INTRAVENOUS
  Filled 2020-04-21: qty 4

## 2020-04-21 MED ORDER — SODIUM CHLORIDE 0.9 % IV SOLN
510.0000 mg | Freq: Once | INTRAVENOUS | Status: AC
  Administered 2020-04-22: 510 mg via INTRAVENOUS
  Filled 2020-04-21: qty 510

## 2020-04-21 MED ORDER — POTASSIUM CHLORIDE CRYS ER 20 MEQ PO TBCR
20.0000 meq | EXTENDED_RELEASE_TABLET | Freq: Every day | ORAL | Status: DC
  Administered 2020-04-21 – 2020-04-22 (×2): 20 meq via ORAL
  Filled 2020-04-21 (×2): qty 1

## 2020-04-21 MED ORDER — ONDANSETRON HCL 8 MG PO TABS
8.0000 mg | ORAL_TABLET | Freq: Three times a day (TID) | ORAL | 0 refills | Status: DC | PRN
Start: 2020-04-21 — End: 2020-05-13

## 2020-04-21 NOTE — Progress Notes (Signed)
HEMATOLOGY-ONCOLOGY PROGRESS NOTE  SUBJECTIVE:   Patient was seen in f/u for aggressive large B cell lypmhoma. Tolerating C1D3 of EPOCH well without any acute new concerns. No nausea/vomiting. Good BM.Improving po intake. Notes some decrease in abdominal distension and discomfort. 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[place])   04/18/2020 -  Chemotherapy   The patient had dexamethasone (DECADRON) 4 MG tablet, 8 mg, Oral, 2 times daily with meals, 1 of 1 cycle, Start date: --, End date: -- DOXOrubicin (ADRIAMYCIN) 16 mg, etoposide (VEPESID) 80 mg, vinCRIStine (ONCOVIN) 0.6 mg in sodium chloride 0.9 % 1,000 mL chemo infusion, , Intravenous, Once, 1 of 4 cycles Administration:  (04/18/2020),  (04/19/2020) ondansetron (ZOFRAN) 8 mg, dexamethasone (DECADRON) 10 mg in sodium chloride 0.9 % 50 mL IVPB, , Intravenous,  Once, 1 of 4 cycles Administration: 8 mg (04/18/2020), 18 mg (04/19/2020) cyclophosphamide (CYTOXAN) 1,180 mg in sodium chloride 0.9 % 250 mL chemo infusion, 750 mg/m2 = 1,180 mg, Intravenous,  Once, 1 of 4 cycles  for chemotherapy treatment.    04/25/2020 -  Chemotherapy   The patient had pegfilgrastim-cbqv (UDENYCA) injection 6 mg, 6 mg, Subcutaneous, Once, 0 of 4 cycles riTUXimab-pvvr (RUXIENCE) 600 mg in sodium chloride 0.9 % 250 mL (1.9355 mg/mL) infusion, 375 mg/m2 = 600 mg, Intravenous,  Once, 0 of 4 cycles  for chemotherapy treatment.       REVIEW OF SYSTEMS:   .10 Point review of Systems was done is negative except as noted above.   I have reviewed the past medical history, past surgical history, social history and family history with the patient and they are unchanged from previous note.   PHYSICAL EXAMINATION: ECOG PERFORMANCE STATUS: 1 - Symptomatic but completely ambulatory  Vitals:   04/20/20  1231 04/20/20 2330  BP: (!) 147/99 (!) 152/91  Pulse: 84 87  Resp: 20 15  Temp: 98.1 F (36.7 C) 98.1 F (36.7 C)  SpO2: 97% 99%   Filed Weights   04/18/20 1104  Weight: 125 lb 1.6 oz (56.7 kg)    Intake/Output from previous day: 06/16 0701 - 06/17 0700 In: 986.8 [I.V.:383.3; IV Piggyback:603.5] Out: -   GENERAL:alert, in no acute distress and comfortable SKIN: no acute rashes, no significant lesions EYES: conjunctiva are pink and non-injected, sclera anicteric OROPHARYNX: MMM, no exudates, no oropharyngeal erythema or ulceration NECK: supple, no JVD LYMPH:  no palpable lymphadenopathy in the cervical, axillary or inguinal regions LUNGS: decreased air entry left lung base. HEART: regular rate & rhythm ABDOMEN:  normoactive bowel sounds , non tender, not distended. Extremity: no pedal edema PSYCH: alert & oriented x 3 with fluent speech NEURO: no focal motor/sensory deficits   LABORATORY DATA:  I have reviewed the data as listed CMP Latest Ref Rng & Units 04/20/2020 04/19/2020 04/18/2020  Glucose 70 - 99 mg/dL 220(H) 109(H) 119(H)  BUN 6 - 20 mg/dL _0 Creatinine 0.44 - 1.00 mg/dL 0.50 0.66 0.81  Sodium 135 - 145 mmol/L 137 142 138  Potassium 3.5 - 5.1 mmol/L 3.5 3.8 3.9  Chloride 98 - 111 mmol/L 98 105 101  CO2 22 - 32 mmol/L _1 Calcium 8.9 - 10.3 mg/dL 9.9 9.8 10.5(H)  Total Protein 6.5 - 8.1 g/dL 6.3(L) 6.3(L) 7.2  Total Bilirubin 0.3 - 1.2 mg/dL 0.2(L) 0.7 0.7  Alkaline Phos 38 - 126 U/L 74 76 88  AST 15 - 41 U/L 30 27 34  ALT 0 - 44 U/L _0 Lab Results  Component Value Date   WBC 12.8 (H) 04/20/2020   HGB 9.3 (L) 04/20/2020   HCT 30.6 (L) 04/20/2020   MCV 71.8 (L) 04/20/2020   PLT 506 (H) 04/20/2020   NEUTROABS 11.6 (H) 04/20/2020    DG Chest 1 View  Result Date: 04/08/2020 CLINICAL DATA:  Post thoracentesis EXAM: CHEST  1 VIEW COMPARISON:  CT 04/07/2020 FINDINGS: Moderate to large left pleural effusion remains following  thoracentesis. No pneumothorax. Right lung clear. Heart is normal size. No effusions. IMPRESSION: Moderate to large left effusion with left base atelectasis. No pneumothorax. Electronically Signed   By: Rolm Baptise M.D.   On: 04/08/2020 16:55   DG Chest 2 View  Result Date: 04/14/2020 CLINICAL DATA:  Shortness of breath EXAM: CHEST - 2 VIEW COMPARISON:  04/08/2020 FINDINGS: Moderate to large left pleural effusion, slightly decreased since prior study. Left base atelectasis or infiltrate. Right lung clear. Heart is normal size. IMPRESSION: Moderate to large left effusion with left base atelectasis or infiltrate. Effusion slightly decreased since prior study. Electronically Signed   By: Rolm Baptise M.D.   On: 04/14/2020 14:39   DG Abd 1 View  Result Date: 04/14/2020 CLINICAL DATA:  Shortness of breath, abdominal swelling EXAM: ABDOMEN - 1 VIEW COMPARISON:  CT 04/06/2020 FINDINGS: Nonobstructive bowel gas pattern. No free air or suspicious calcification. Moderate stool burden throughout the colon. Prominent soft tissue density seen in the left upper abdomen corresponding to the previously seen large left upper quadrant mass involving the pancreas and spleen on prior CT. IMPRESSION: No evidence of bowel obstruction or free air. Prominent soft tissue density in the left upper quadrant corresponding to the large mass seen involving much of the pancreas and spleen on prior CT. Electronically Signed   By: Rolm Baptise M.D.   On: 04/14/2020 14:40   CT CHEST W CONTRAST  Result Date: 04/07/2020 CLINICAL DATA:  44 year old female with cancer of unknown primary. Pancreatic mass noted on the CT of the abdomen pelvis dated 04/06/2020. EXAM: CT CHEST WITH CONTRAST TECHNIQUE: Multidetector CT imaging of the chest was performed during intravenous contrast administration. CONTRAST:  31m OMNIPAQUE IOHEXOL 300 MG/ML  SOLN COMPARISON:  Chest CT dated 11/29/2017. FINDINGS: Cardiovascular: There is no cardiomegaly or  pericardial effusion. The thoracic aorta is unremarkable. The origins of the great vessels of the aortic arch appear patent as visualized. Evaluation of the pulmonary arteries is very limited due to suboptimal opacification and timing of the contrast. No definite large central pulmonary artery embolus identified. V/Q scan may provide better evaluation if there is high clinical concern for acute PE. Mediastinum/Nodes: No hilar or mediastinal adenopathy. The esophagus and the thyroid gland are grossly unremarkable. No mediastinal fluid collection. Lungs/Pleura: Large left pleural effusion with near complete compressive atelectasis of the left lower lobe and partial right upper lobe. Pneumonia is not excluded. Clinical correlation is recommended. The right lung is clear. There is no pneumothorax. There is slight shift of the mediastinum to the right of the midline. The central airways are patent. Upper Abdomen: Large heterogeneous upper abdominal mass with invasion of the spleen. Musculoskeletal: No acute osseous pathology. IMPRESSION: 1. Large left pleural effusion with near complete compressive atelectasis of the left lower lobe and partial right upper lobe. Pneumonia is not excluded. Clinical correlation is recommended. Consider  thoracentesis for symptomatic relief. 2. Large heterogeneous upper abdominal mass with invasion of the spleen. Electronically Signed   By: Anner Crete M.D.   On: 04/07/2020 23:31   CT ABDOMEN PELVIS W CONTRAST  Result Date: 04/06/2020 CLINICAL DATA:  Periodically epigastric pain EXAM: CT ABDOMEN AND PELVIS WITH CONTRAST TECHNIQUE: Multidetector CT imaging of the abdomen and pelvis was performed using the standard protocol following bolus administration of intravenous contrast. CONTRAST:  160m OMNIPAQUE IOHEXOL 300 MG/ML  SOLN COMPARISON:  None. FINDINGS: Lower chest: The visualized heart size within normal limits. No pericardial fluid/thickening. No hiatal hernia. There is a small  to moderate left pleural effusion with basilar atelectasis. Hepatobiliary: The liver is normal in density without focal abnormality.The main portal vein is patent. No evidence of calcified gallstones, gallbladder wall thickening or biliary dilatation. Pancreas: There is a extensive heterogeneously enhancing mass involving nearly the entirety of the pancreatic body and tail. Only a small portion of the pancreatic head appears to be spared. The SMV and SMA appear to be patent. No areas of internal necrosis seen within the mass. The mass appears to extend into the splenic hilum. Spleen: Extensive large multiple heterogeneously enhancing hypodense masses seen throughout the splenic parenchyma likely from the extension of the pancreatic mass which extends through the splenic hilum. There is diffuse splenomegaly. The splenic vein appears to be narrowed wall entering the splenic hilum. Adrenals/Urinary Tract: Both adrenal glands appear normal. The kidneys and collecting system appear normal without evidence of urinary tract calculus or hydronephrosis. Bladder is unremarkable. Stomach/Bowel: There appears to be diffuse wall thickening seen within the proximal stomach with with the heterogeneous mass seen encompassing the proximal portion. The remainder of the small-bowel and colon are unremarkable. There is a moderate amount of colonic stool present. Vascular/Lymphatic: There is a left periaortic nodule/lymph node causing mild displacement of the third portion of the duodenum measuring 2.8 x 2.7 cm. Scattered small left-sided periaortic lymph nodes are also noted. Reproductive: Extensively enlarged uterus with multiple hypodense fibroids are seen throughout as on a prior MRI dating back to 2017. Small amount of fluid within the endometrial canal. Other: Small amount of abdominopelvic ascites is seen. Musculoskeletal: No acute or significant osseous findings. 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 Retroperitoneal adenopathy, consistent with metastatic disease Diffuse wall thickening seen within the proximal stomach which could either be related to gastritis or possible metastatic disease. Small amount of abdominopelvic ascites Enlarged uterus with multiple uterine fibroids These results were called by telephone at the time of interpretation on 04/06/2020 at 5:19 am [facility] verbally acknowledged these results. Electronically Signed   By: BPrudencio PairM.D.   On: 04/06/2020 05:19   NM PET Image Initial (PI) Skull Base To Thigh  Result Date: 04/15/2020 CLINICAL DATA:  Initial treatment strategy for diffuse large B-cell lymphoma. EXAM: NUCLEAR MEDICINE PET SKULL BASE TO THIGH TECHNIQUE: 6.4 mCi F-18 FDG was injected intravenously. Full-ring PET imaging was performed from the skull base to thigh after the radiotracer. CT data was obtained and used for attenuation correction and anatomic localization. Fasting blood glucose: 101 mg/dl COMPARISON:  CT scan 04/06/2020 FINDINGS: Mediastinal blood pool activity: SUV max 1.84 Liver activity: SUV max 2.61 NECK: No hypermetabolic lymph nodes in the neck. Incidental CT findings: none CHEST: No hypermetabolic mediastinal  or hilar nodes. No suspicious pulmonary nodules on the CT scan. No hypermetabolic breast lesions, supraclavicular or axillary adenopathy. Incidental CT findings: Persistent left pleural effusion and overlying left lower lobe atelectasis. ABDOMEN/PELVIS: Large left upper quadrant abdominal mass involving the spleen, stomach and retroperitoneum is markedly hypermetabolic with SUV max of 38.25. Associated contralateral retroperitoneal lymphadenopathy is hypermetabolic with SUV max of 05.39. No pelvic lymphadenopathy. No inguinal  lymphadenopathy. No worrisome hepatic lesions. Incidental CT findings: Markedly enlarged fibroid uterus is noted. Simple appearing cyst associated with the left ovary. Subcutaneous lesions involving the anterior abdominal wall. One is near the umbilicus. The other is overlying the mid pelvis. These have the appearance of some type of skin blister. Recommend clinical correlation. Mild hypermetabolism could suggest an inflammatory process. Given patient's age IV status Kaposi's sarcoma would be another possibility. SKELETON: No findings suspicious for osseous metastatic disease. Incidental CT findings: none IMPRESSION: 1. Large left upper quadrant mass is markedly hypermetabolic and consistent with patient's known lymphoma. 2. No findings to suggest involvement of the neck, chest or pelvis or bony structures. 3. Enlarged fibroid uterus. 4. Midline anterior abdominal skin lesions. Mild hypermetabolism. These could reflect some type of skin blisters. I suppose Kaposi's sarcoma would be another consideration given the patient's HIV status. Electronically Signed   By: Marijo Sanes M.D.   On: 04/15/2020 14:20   CT BIOPSY  Result Date: 04/08/2020 INDICATION: 44 year old female with pancreatic mass versus peripancreatic adenopathy and associated involvement of the spleen. Findings are concerning for possible lymphoma. Patient presents for CT-guided core biopsy to obtain tissue diagnosis. EXAM: CT-guided core biopsy MEDICATIONS: None. ANESTHESIA/SEDATION: Moderate (conscious) sedation was employed during this procedure. A total of Versed 2 mg and Fentanyl 100 mcg was administered intravenously. Moderate Sedation Time: 12 minutes. The patient's level of consciousness and vital signs were monitored continuously by radiology nursing throughout the procedure under my direct supervision. FLUOROSCOPY TIME:  None. COMPLICATIONS: None immediate. PROCEDURE: Informed written consent was obtained from the patient after a thorough  discussion of the procedural risks, benefits and alternatives. All questions were addressed. A timeout was performed prior to the initiation of the procedure. A planning axial CT scan was performed. The mass was identified. A suitable skin entry site was selected and marked. Local anesthesia was attained by infiltration with 1% lidocaine. A small dermatotomy was made. Under intermittent CT guidance, a 17 gauge introducer needle was carefully advanced into the margin of the mass. Multiple 18 gauge core biopsies were then obtained coaxially using the bio Pince automated biopsy device. Biopsy specimens were placed in saline and delivered to pathology for further analysis. Gel-Foam was injected through the introducer needle as it was removed. Post biopsy CT imaging demonstrates no evidence of hematoma or other immediate complication. The patient tolerated the procedure well. IMPRESSION: Technically successful CT-guided biopsy of pancreatic mass versus peripancreatic lymphadenopathy. Electronically Signed   By: Jacqulynn Cadet M.D.   On: 04/08/2020 16:32   ECHOCARDIOGRAM COMPLETE  Result Date: 04/18/2020    ECHOCARDIOGRAM REPORT   Patient Name:   ALIANA KREISCHER Date of Exam: 04/18/2020 Medical Rec #:  [id number]       Height:       60.0 in Accession #:    [id number]      Weight:       127.0 lb Date of Birth:  1976/05/20        BSA:          1.539 m Patient Age:    67 years  BP:           142/98 mmHg Patient Gender: F               HR:           91 bpm. Exam Location:  Inpatient Procedure: 2D Echo, Cardiac Doppler, Color Doppler and Strain Analysis STAT ECHO Indications:    Chemotherapy evaluation v87.41 / v58.11  History:        Patient has no prior history of Echocardiogram examinations.                 Risk Factors:Non-Smoker. Pleural effusion.  Sonographer:    Vickie Epley RDCS Referring Phys: [place]  1. Left ventricular ejection fraction, by estimation, is 60 to 65%. The left  ventricle has normal function. The left ventricle has no regional wall motion abnormalities. Left ventricular diastolic parameters were normal. The average left ventricular global longitudinal strain is -22.3 %.  2. Right ventricular systolic function is normal. The right ventricular size is normal. Tricuspid regurgitation signal is inadequate for assessing PA pressure.  3. The mitral valve is normal in structure. Trivial mitral valve regurgitation.  4. The aortic valve is tricuspid. Aortic valve regurgitation is not visualized. No aortic stenosis is present.  5. The inferior vena cava is normal in size with greater than 50% respiratory variability, suggesting right atrial pressure of 3 mmHg. FINDINGS  Left Ventricle: Left ventricular ejection fraction, by estimation, is 60 to 65%. The left ventricle has normal function. The left ventricle has no regional wall motion abnormalities. The average left ventricular global longitudinal strain is -22.3 %. The left ventricular internal cavity size was normal in size. There is no left ventricular hypertrophy. Left ventricular diastolic parameters were normal. Right Ventricle: The right ventricular size is normal. Right vetricular wall thickness was not assessed. Right ventricular systolic function is normal. Tricuspid regurgitation signal is inadequate for assessing PA pressure. Left Atrium: Left atrial size was normal in size. Right Atrium: Right atrial size was normal in size. Pericardium: Trivial pericardial effusion is present. Mitral Valve: The mitral valve is normal in structure. Trivial mitral valve regurgitation. Tricuspid Valve: The tricuspid valve is normal in structure. Tricuspid valve regurgitation is not demonstrated. Aortic Valve: The aortic valve is tricuspid. Aortic valve regurgitation is not visualized. No aortic stenosis is present. Pulmonic Valve: The pulmonic valve was not well visualized. Pulmonic valve regurgitation is not visualized. Aorta: The aortic  root is normal in size and structure. Venous: The inferior vena cava is normal in size with greater than 50% respiratory variability, suggesting right atrial pressure of 3 mmHg. IAS/Shunts: The interatrial septum was not well visualized. Additional Comments: There is pleural effusion in the left lateral region.  LEFT VENTRICLE PLAX 2D LVIDd:         4.00 cm     Diastology LVIDs:         2.80 cm     LV e' lateral:   12.40 cm/s LV PW:         0.70 cm     LV E/e' lateral: 7.3 LV IVS:        0.70 cm     LV e' medial:    8.70 cm/s LVOT diam:     1.60 cm     LV E/e' medial:  10.4 LV SV:         36 LV SV Index:   23          2D Longitudinal  Strain LVOT Area:     2.01 cm    2D Strain GLS Avg:     -22.3 %  LV Volumes (MOD) LV vol d, MOD A2C: 83.6 ml LV vol d, MOD A4C: 74.0 ml LV vol s, MOD A2C: 33.0 ml LV vol s, MOD A4C: 37.0 ml LV SV MOD A2C:     50.6 ml LV SV MOD A4C:     74.0 ml LV SV MOD BP:      43.6 ml RIGHT VENTRICLE RV S prime:     13.80 cm/s TAPSE (M-mode): 2.2 cm LEFT ATRIUM             Index       RIGHT ATRIUM          Index LA diam:        4.00 cm 2.60 cm/m  RA Area:     7.07 cm LA Vol (A2C):   17.7 ml 11.50 ml/m RA Volume:   9.87 ml  6.41 ml/m LA Vol (A4C):   26.1 ml 16.96 ml/m LA Biplane Vol: 23.4 ml 15.20 ml/m  AORTIC VALVE LVOT Vmax:   94.60 cm/s LVOT Vmean:  59.100 cm/s LVOT VTI:    0.177 m  AORTA Ao Root diam: 2.90 cm MITRAL VALVE MV Area (PHT): 4.80 cm    SHUNTS MV Decel Time: 158 msec    Systemic VTI:  0.18 m MV E velocity: 90.80 cm/s  Systemic Diam: 1.60 cm MV A velocity: 85.90 cm/s MV E/A ratio:  1.06 Oswaldo Milian MD Electronically signed by Oswaldo Milian MD Signature Date/Time: 04/18/2020/12:07:09 PM    Final    IR IMAGING GUIDED PORT INSERTION  Result Date: 04/15/2020 INDICATION: History of diffuse large B-cell lymphoma. In need of durable intravenous access for chemotherapy administration. EXAM: IMPLANTED PORT A CATH PLACEMENT WITH ULTRASOUND AND FLUOROSCOPIC GUIDANCE  COMPARISON:  Chest CT-04/07/2020 MEDICATIONS: Ancef 2 gm IV; The antibiotic was administered within an appropriate time interval prior to skin puncture. ANESTHESIA/SEDATION: Moderate (conscious) sedation was employed during this procedure. A total of Versed 4 mg and Fentanyl 100 mcg was administered intravenously. Moderate Sedation Time: 26 minutes. The patient's level of consciousness and vital signs were monitored continuously by radiology nursing throughout the procedure under my direct supervision. CONTRAST:  None FLUOROSCOPY TIME:  30 seconds (5 mGy) COMPLICATIONS: None immediate. PROCEDURE: The procedure, risks, benefits, and alternatives were explained to the patient. Questions regarding the procedure were encouraged and answered. The patient understands and consents to the procedure. The right neck and chest were prepped with chlorhexidine in a sterile fashion, and a sterile drape was applied covering the operative field. Maximum barrier sterile technique with sterile gowns and gloves were used for the procedure. A timeout was performed prior to the initiation of the procedure. Local anesthesia was provided with 1% lidocaine with epinephrine. After creating a small venotomy incision, a micropuncture kit was utilized to access the internal jugular vein. Real-time ultrasound guidance was utilized for vascular access including the acquisition of a permanent ultrasound image documenting patency of the accessed vessel. The microwire was utilized to measure appropriate catheter length. A subcutaneous port pocket was then created along the upper chest wall utilizing a combination of sharp and blunt dissection. The pocket was irrigated with sterile saline. A single lumen "ISP" sized power injectable port was chosen for placement. The 8 Fr catheter was tunneled from the port pocket site to the venotomy incision. The port was placed in the pocket. The external catheter was trimmed  to appropriate length. At the  venotomy, an 8 Fr peel-away sheath was placed over a guidewire under fluoroscopic guidance. The catheter was then placed through the sheath and the sheath was removed. Final catheter positioning was confirmed and documented with a fluoroscopic spot radiograph. The port was accessed with a Huber needle, aspirated and flushed with heparinized saline. The venotomy site was closed with an interrupted 4-0 Vicryl suture. The port pocket incision was closed with interrupted 2-0 Vicryl suture. The skin was opposed with a running subcuticular 4-0 Vicryl suture. Dermabond and Steri-strips were applied to both incisions. Dressings were applied. The patient tolerated the procedure well without immediate post procedural complication. FINDINGS: After catheter placement, the tip lies within the superior cavoatrial junction. The catheter aspirates and flushes normally and is ready for immediate use. IMPRESSION: Successful placement of a right internal jugular approach power injectable Port-A-Cath. The catheter is ready for immediate use. Electronically Signed   By: Sandi Mariscal M.D.   On: 04/15/2020 15:28   IR THORACENTESIS ASP PLEURAL SPACE W/IMG GUIDE  Result Date: 04/15/2020 INDICATION: Recent diagnosis diffuse large B-cell lymphoma, now with recurrent left-sided pleural effusion. Please perform ultrasound-guided thoracentesis for therapeutic purposes prior to PET-CT. EXAM: IR THORACENTESIS ASP PLEURAL SPACE W/IMG GUIDE COMPARISON:  Chest CT-04/07/2020 MEDICATIONS: None. COMPLICATIONS: None immediate. TECHNIQUE: Informed written consent was obtained from the patient after a discussion of the risks, benefits and alternatives to treatment. A timeout was performed prior to the initiation of the procedure. Initial ultrasound scanning demonstrates a small left-sided anechoic pleural effusion. The lower chest was prepped and draped in the usual sterile fashion. 1% lidocaine was used for local anesthesia. An ultrasound image was  saved for documentation purposes. An 8 Fr Safe-T-Centesis catheter was introduced. The thoracentesis was performed. The catheter was removed and a dressing was applied. The patient tolerated the procedure well without immediate post procedural complication. The patient was escorted to have an upright chest radiograph. FINDINGS: A total of approximately 200 cc of orange colored pleural fluid was removed. IMPRESSION: Successful ultrasound-guided left sided thoracentesis yielding 200 cc orange colored pleural fluid. Electronically Signed   By: Sandi Mariscal M.D.   On: 04/15/2020 16:18   US THORACENTESIS ASP PLEURAL SPACE W/IMG GUIDE  Result Date: 04/08/2020 INDICATION: Patient with history of HIV, pancreatic mass, retroperitoneal adenopathy, ascites, splenic masses, left pleural effusion. Request received for diagnostic and therapeutic left thoracentesis. EXAM: ULTRASOUND GUIDED DIAGNOSTIC AND THERAPEUTIC LEFT THORACENTESIS MEDICATIONS: None COMPLICATIONS: None immediate. PROCEDURE: An ultrasound guided thoracentesis was thoroughly discussed with the patient and questions answered. The benefits, risks, alternatives and complications were also discussed. The patient understands and wishes to proceed with the procedure. Written consent was obtained. Ultrasound was performed to localize and mark an adequate pocket of fluid in the left chest. The area was then prepped and draped in the normal sterile fashion. 1% Lidocaine was used for local anesthesia. Under ultrasound guidance a 6 Fr Safe-T-Centesis catheter was introduced. Thoracentesis was performed. The catheter was removed and a dressing applied. FINDINGS: A total of approximately 1.5 liters of blood-tinged fluid was removed. Samples were sent to the laboratory as requested by the clinical team. Due to patient chest discomfort and coughing only the above amount of fluid was removed today. IMPRESSION: Successful ultrasound guided diagnostic and therapeutic left  thoracentesis yielding 1.5 liters of pleural fluid. Read by: Rowe Robert, PA-C Electronically Signed   By: Jacqulynn Cadet M.D.   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 No internal necrosis within the mass noted. Diffuse splenomegaly. Left periaortic lymph node causing mild displacement of the third portion of the duodenum.  Significantly elevated LDH levels. 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 -Baseline echocardiogram showed an LVEF of 60 to 65%.  # 2 Left sided large pleural effusion and lung atelectasis. S/p diagnostic and therapeutic thoracentesis -- lymphocytic predominance @ 80%  HIV could be risk factors for high grade EBV driven lymphomas  Patient symptomatology has developed rather quickly over the last few weeks.  #81mcrocytic anemia-chronic.Some element of iron deficiency versus hemoglobinopathy versus anemia of chronic disease related to malignancy. Patient has received 1 dose of IV Feraheme on 04/08/2020 She is due for a repeat dose and may consider giving this as an inpatient later this week  #4history of HIV/AIDS [facility]   PLAN: -Labs from this morning have been reviewed and are within normal limits except for WBC 11.9, hemoglobin 9.4, hematocrit 30.2, MCV 71.2, MCH 22.2, RDW 20.3, platelets 526,000, ANC 10.2, absolute  lymphocytes 0.3, absolute monocytes 1.3, glucose 109, total protein 6.3, and albumin 2.8.  Her uric acid level and phosphorus level were normal. -We will follow CBC with differential, CMET, uric acid, and phosphorus levels daily. -Continue allopurinol 100 mg twice a day. -Continue as needed antiemetics. -Imild constipation -MiraLAX 17 g daily. -as needed Tylenol and ibuprofen for the patient for her abdominal pain.  She does not want anything stronger. -outpatient Udenyca and Rituxan on 04/25/2020 -no prohibitive toxicities that preclude continuing EPOCH C1D3 today. -shall continue to f/u daily   LOS: 3 days   GSullivan LoneMD MS

## 2020-04-21 NOTE — Telephone Encounter (Signed)
Per Dr. Irene Limbo: pt. would like to know the status of her FMLA application. Per Ros; forms and will be processed  ASAP, TCT pt. informing her of this, she verbalized understanding.

## 2020-04-21 NOTE — Progress Notes (Addendum)
HEMATOLOGY-ONCOLOGY PROGRESS NOTE  SUBJECTIVE:   Patient was seen in f/u for aggressive large B cell lypmhoma. Tolerating C1D3 of EPOCH well without any acute new concerns.  Abdominal pain/distention improved. No nausea/vomiting. Good BM.Improving po intake. 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[place])   04/18/2020 -  Chemotherapy   The patient had dexamethasone (DECADRON) 4 MG tablet, 8 mg, Oral, 2 times daily with meals, 1 of 1 cycle, Start date: --, End date: -- DOXOrubicin (ADRIAMYCIN) 16 mg, etoposide (VEPESID) 80 mg, vinCRIStine (ONCOVIN) 0.6 mg in sodium chloride 0.9 % 1,000 mL chemo infusion, , Intravenous, Once, 1 of 4 cycles Administration:  (04/18/2020),  (04/19/2020),  (04/20/2020) ondansetron (ZOFRAN) 8 mg, dexamethasone (DECADRON) 10 mg in sodium chloride 0.9 % 50 mL IVPB, , Intravenous,  Once, 1 of 4 cycles Administration: 8 mg (04/18/2020), 18 mg (04/19/2020), 8 mg (04/20/2020) cyclophosphamide (CYTOXAN) 1,180 mg in sodium chloride 0.9 % 250 mL chemo infusion, 750 mg/m2 = 1,180 mg, Intravenous,  Once, 1 of 4 cycles  for chemotherapy treatment.    04/25/2020 -  Chemotherapy   The patient had pegfilgrastim-cbqv (UDENYCA) injection 6 mg, 6 mg, Subcutaneous, Once, 0 of 4 cycles riTUXimab-pvvr (RUXIENCE) 600 mg in sodium chloride 0.9 % 250 mL (1.9355 mg/mL) infusion, 375 mg/m2 = 600 mg, Intravenous,  Once, 0 of 4 cycles  for chemotherapy treatment.       REVIEW OF SYSTEMS:   .10 Point review of Systems was done is negative except as noted above.   I have reviewed the past medical history, past surgical history, social history and family history with the patient and they are unchanged from previous note.   PHYSICAL EXAMINATION: ECOG PERFORMANCE STATUS: 1 - Symptomatic but completely ambulatory  Vitals:    04/20/20 2330 04/21/20 0634  BP: (!) 152/91 (!) 152/86  Pulse: 87 80  Resp: 15 16  Temp: 98.1 F (36.7 C) 98.3 F (36.8 C)  SpO2: 99% 97%   Filed Weights   04/18/20 1104  Weight: 56.7 kg    Intake/Output from previous day: 06/16 0701 - 06/17 0700 In: 986.8 [I.V.:383.3; IV Piggyback:603.5] Out: -   GENERAL:alert, in no acute distress and comfortable SKIN: no acute rashes, no significant lesions EYES: conjunctiva are pink and non-injected, sclera anicteric OROPHARYNX: MMM, no exudates, no oropharyngeal erythema or ulceration NECK: supple, no JVD LYMPH:  no palpable lymphadenopathy in the cervical, axillary or inguinal regions LUNGS: decreased air entry left lung base. HEART: regular rate & rhythm ABDOMEN:  normoactive bowel sounds , non tender, not distended. Extremity: no pedal edema PSYCH: alert & oriented x 3 with fluent speech NEURO: no focal motor/sensory deficits   LABORATORY DATA:  I have reviewed the data as listed CMP Latest Ref Rng & Units 04/21/2020 04/20/2020 04/19/2020  Glucose 70 - 99 mg/dL 114(H) 220(H) 109(H)  BUN 6 - 20 mg/dL _0 Creatinine 0.44 - 1.00 mg/dL 0.59 0.50 0.66  Sodium 135 - 145 mmol/L 136 137 142  Potassium 3.5 - 5.1 mmol/L 3.4(L) 3.5 3.8  Chloride 98 - 111 mmol/L 98 98 105  CO2 22 - 32 mmol/L _1 Calcium 8.9 - 10.3 mg/dL 9.7 9.9 9.8  Total Protein 6.5 - 8.1 g/dL 6.3(L) 6.3(L) 6.3(L)  Total Bilirubin 0.3 - 1.2 mg/dL 0.5 0.2(L) 0.7  Alkaline Phos  38 - 126 U/L 75 74 76  AST 15 - 41 U/L 33 30 27  ALT 0 - 44 U/L _0 Lab Results  Component Value Date   WBC 9.1 04/21/2020   HGB 9.6 (L) 04/21/2020   HCT 30.8 (L) 04/21/2020   MCV 71.1 (L) 04/21/2020   PLT 461 (H) 04/21/2020   NEUTROABS 8.6 (H) 04/21/2020    DG Chest 1 View  Result Date: 04/08/2020 CLINICAL DATA:  Post thoracentesis EXAM: CHEST  1 VIEW COMPARISON:  CT 04/07/2020 FINDINGS: Moderate to large left pleural effusion remains following thoracentesis. No  pneumothorax. Right lung clear. Heart is normal size. No effusions. IMPRESSION: Moderate to large left effusion with left base atelectasis. No pneumothorax. Electronically Signed   By: Rolm Baptise M.D.   On: 04/08/2020 16:55   DG Chest 2 View  Result Date: 04/14/2020 CLINICAL DATA:  Shortness of breath EXAM: CHEST - 2 VIEW COMPARISON:  04/08/2020 FINDINGS: Moderate to large left pleural effusion, slightly decreased since prior study. Left base atelectasis or infiltrate. Right lung clear. Heart is normal size. IMPRESSION: Moderate to large left effusion with left base atelectasis or infiltrate. Effusion slightly decreased since prior study. Electronically Signed   By: Rolm Baptise M.D.   On: 04/14/2020 14:39   DG Abd 1 View  Result Date: 04/14/2020 CLINICAL DATA:  Shortness of breath, abdominal swelling EXAM: ABDOMEN - 1 VIEW COMPARISON:  CT 04/06/2020 FINDINGS: Nonobstructive bowel gas pattern. No free air or suspicious calcification. Moderate stool burden throughout the colon. Prominent soft tissue density seen in the left upper abdomen corresponding to the previously seen large left upper quadrant mass involving the pancreas and spleen on prior CT. IMPRESSION: No evidence of bowel obstruction or free air. Prominent soft tissue density in the left upper quadrant corresponding to the large mass seen involving much of the pancreas and spleen on prior CT. Electronically Signed   By: Rolm Baptise M.D.   On: 04/14/2020 14:40   CT CHEST W CONTRAST  Result Date: 04/07/2020 CLINICAL DATA:  44 year old female with cancer of unknown primary. Pancreatic mass noted on the CT of the abdomen pelvis dated 04/06/2020. EXAM: CT CHEST WITH CONTRAST TECHNIQUE: Multidetector CT imaging of the chest was performed during intravenous contrast administration. CONTRAST:  62m OMNIPAQUE IOHEXOL 300 MG/ML  SOLN COMPARISON:  Chest CT dated 11/29/2017. FINDINGS: Cardiovascular: There is no cardiomegaly or pericardial effusion. The  thoracic aorta is unremarkable. The origins of the great vessels of the aortic arch appear patent as visualized. Evaluation of the pulmonary arteries is very limited due to suboptimal opacification and timing of the contrast. No definite large central pulmonary artery embolus identified. V/Q scan may provide better evaluation if there is high clinical concern for acute PE. Mediastinum/Nodes: No hilar or mediastinal adenopathy. The esophagus and the thyroid gland are grossly unremarkable. No mediastinal fluid collection. Lungs/Pleura: Large left pleural effusion with near complete compressive atelectasis of the left lower lobe and partial right upper lobe. Pneumonia is not excluded. Clinical correlation is recommended. The right lung is clear. There is no pneumothorax. There is slight shift of the mediastinum to the right of the midline. The central airways are patent. Upper Abdomen: Large heterogeneous upper abdominal mass with invasion of the spleen. Musculoskeletal: No acute osseous pathology. IMPRESSION: 1. Large left pleural effusion with near complete compressive atelectasis of the left lower lobe and partial right upper lobe. Pneumonia is not excluded. Clinical correlation is recommended. Consider thoracentesis for symptomatic  relief. 2. Large heterogeneous upper abdominal mass with invasion of the spleen. Electronically Signed   By: Anner Crete M.D.   On: 04/07/2020 23:31   CT ABDOMEN PELVIS W CONTRAST  Result Date: 04/06/2020 CLINICAL DATA:  Periodically epigastric pain EXAM: CT ABDOMEN AND PELVIS WITH CONTRAST TECHNIQUE: Multidetector CT imaging of the abdomen and pelvis was performed using the standard protocol following bolus administration of intravenous contrast. CONTRAST:  151m OMNIPAQUE IOHEXOL 300 MG/ML  SOLN COMPARISON:  None. FINDINGS: Lower chest: The visualized heart size within normal limits. No pericardial fluid/thickening. No hiatal hernia. There is a small to moderate left pleural  effusion with basilar atelectasis. Hepatobiliary: The liver is normal in density without focal abnormality.The main portal vein is patent. No evidence of calcified gallstones, gallbladder wall thickening or biliary dilatation. Pancreas: There is a extensive heterogeneously enhancing mass involving nearly the entirety of the pancreatic body and tail. Only a small portion of the pancreatic head appears to be spared. The SMV and SMA appear to be patent. No areas of internal necrosis seen within the mass. The mass appears to extend into the splenic hilum. Spleen: Extensive large multiple heterogeneously enhancing hypodense masses seen throughout the splenic parenchyma likely from the extension of the pancreatic mass which extends through the splenic hilum. There is diffuse splenomegaly. The splenic vein appears to be narrowed wall entering the splenic hilum. Adrenals/Urinary Tract: Both adrenal glands appear normal. The kidneys and collecting system appear normal without evidence of urinary tract calculus or hydronephrosis. Bladder is unremarkable. Stomach/Bowel: There appears to be diffuse wall thickening seen within the proximal stomach with with the heterogeneous mass seen encompassing the proximal portion. The remainder of the small-bowel and colon are unremarkable. There is a moderate amount of colonic stool present. Vascular/Lymphatic: There is a left periaortic nodule/lymph node causing mild displacement of the third portion of the duodenum measuring 2.8 x 2.7 cm. Scattered small left-sided periaortic lymph nodes are also noted. Reproductive: Extensively enlarged uterus with multiple hypodense fibroids are seen throughout as on a prior MRI dating back to 2017. Small amount of fluid within the endometrial canal. Other: Small amount of abdominopelvic ascites is seen. Musculoskeletal: No acute or significant osseous findings. 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 Retroperitoneal adenopathy, consistent with metastatic disease Diffuse wall thickening seen within the proximal stomach which could either be related to gastritis or possible metastatic disease. Small amount of abdominopelvic ascites Enlarged uterus with multiple uterine fibroids These results were called by telephone at the time of interpretation on 04/06/2020 at 5:19 am [facility] verbally acknowledged these results. Electronically Signed   By: BPrudencio PairM.D.   On: 04/06/2020 05:19   NM PET Image Initial (PI) Skull Base To Thigh  Result Date: 04/15/2020 CLINICAL DATA:  Initial treatment strategy for diffuse large B-cell lymphoma. EXAM: NUCLEAR MEDICINE PET SKULL BASE TO THIGH TECHNIQUE: 6.4 mCi F-18 FDG was injected intravenously. Full-ring PET imaging was performed from the skull base to thigh after the radiotracer. CT data was obtained and used for attenuation correction and anatomic localization. Fasting blood glucose: 101 mg/dl COMPARISON:  CT scan 04/06/2020 FINDINGS: Mediastinal blood pool activity: SUV max 1.84 Liver activity: SUV max 2.61 NECK: No hypermetabolic lymph nodes in the neck. Incidental CT findings: none CHEST: No hypermetabolic mediastinal or hilar nodes.  No suspicious pulmonary nodules on the CT scan. No hypermetabolic breast lesions, supraclavicular or axillary adenopathy. Incidental CT findings: Persistent left pleural effusion and overlying left lower lobe atelectasis. ABDOMEN/PELVIS: Large left upper quadrant abdominal mass involving the spleen, stomach and retroperitoneum is markedly hypermetabolic with SUV max of 31.49. Associated contralateral retroperitoneal lymphadenopathy is hypermetabolic with SUV max of 70.26. No pelvic lymphadenopathy. No inguinal lymphadenopathy. No worrisome hepatic  lesions. Incidental CT findings: Markedly enlarged fibroid uterus is noted. Simple appearing cyst associated with the left ovary. Subcutaneous lesions involving the anterior abdominal wall. One is near the umbilicus. The other is overlying the mid pelvis. These have the appearance of some type of skin blister. Recommend clinical correlation. Mild hypermetabolism could suggest an inflammatory process. Given patient's age IV status Kaposi's sarcoma would be another possibility. SKELETON: No findings suspicious for osseous metastatic disease. Incidental CT findings: none IMPRESSION: 1. Large left upper quadrant mass is markedly hypermetabolic and consistent with patient's known lymphoma. 2. No findings to suggest involvement of the neck, chest or pelvis or bony structures. 3. Enlarged fibroid uterus. 4. Midline anterior abdominal skin lesions. Mild hypermetabolism. These could reflect some type of skin blisters. I suppose Kaposi's sarcoma would be another consideration given the patient's HIV status. Electronically Signed   By: Marijo Sanes M.D.   On: 04/15/2020 14:20   CT BIOPSY  Result Date: 04/08/2020 INDICATION: 44 year old female with pancreatic mass versus peripancreatic adenopathy and associated involvement of the spleen. Findings are concerning for possible lymphoma. Patient presents for CT-guided core biopsy to obtain tissue diagnosis. EXAM: CT-guided core biopsy MEDICATIONS: None. ANESTHESIA/SEDATION: Moderate (conscious) sedation was employed during this procedure. A total of Versed 2 mg and Fentanyl 100 mcg was administered intravenously. Moderate Sedation Time: 12 minutes. The patient's level of consciousness and vital signs were monitored continuously by radiology nursing throughout the procedure under my direct supervision. FLUOROSCOPY TIME:  None. COMPLICATIONS: None immediate. PROCEDURE: Informed written consent was obtained from the patient after a thorough discussion of the procedural risks,  benefits and alternatives. All questions were addressed. A timeout was performed prior to the initiation of the procedure. A planning axial CT scan was performed. The mass was identified. A suitable skin entry site was selected and marked. Local anesthesia was attained by infiltration with 1% lidocaine. A small dermatotomy was made. Under intermittent CT guidance, a 17 gauge introducer needle was carefully advanced into the margin of the mass. Multiple 18 gauge core biopsies were then obtained coaxially using the bio Pince automated biopsy device. Biopsy specimens were placed in saline and delivered to pathology for further analysis. Gel-Foam was injected through the introducer needle as it was removed. Post biopsy CT imaging demonstrates no evidence of hematoma or other immediate complication. The patient tolerated the procedure well. IMPRESSION: Technically successful CT-guided biopsy of pancreatic mass versus peripancreatic lymphadenopathy. Electronically Signed   By: Jacqulynn Cadet M.D.   On: 04/08/2020 16:32   ECHOCARDIOGRAM COMPLETE  Result Date: 04/18/2020    ECHOCARDIOGRAM REPORT   Patient Name:   JEARLDINE CASSADY Date of Exam: 04/18/2020 Medical Rec #:  [id number]       Height:       60.0 in Accession #:    [id number]      Weight:       127.0 lb Date of Birth:  1976-09-25        BSA:          1.539 m Patient Age:    68 years  BP:           142/98 mmHg Patient Gender: F               HR:           91 bpm. Exam Location:  Inpatient Procedure: 2D Echo, Cardiac Doppler, Color Doppler and Strain Analysis STAT ECHO Indications:    Chemotherapy evaluation v87.41 / v58.11  History:        Patient has no prior history of Echocardiogram examinations.                 Risk Factors:Non-Smoker. Pleural effusion.  Sonographer:    Vickie Epley RDCS Referring Phys: [place]  1. Left ventricular ejection fraction, by estimation, is 60 to 65%. The left ventricle has normal function. The left  ventricle has no regional wall motion abnormalities. Left ventricular diastolic parameters were normal. The average left ventricular global longitudinal strain is -22.3 %.  2. Right ventricular systolic function is normal. The right ventricular size is normal. Tricuspid regurgitation signal is inadequate for assessing PA pressure.  3. The mitral valve is normal in structure. Trivial mitral valve regurgitation.  4. The aortic valve is tricuspid. Aortic valve regurgitation is not visualized. No aortic stenosis is present.  5. The inferior vena cava is normal in size with greater than 50% respiratory variability, suggesting right atrial pressure of 3 mmHg. FINDINGS  Left Ventricle: Left ventricular ejection fraction, by estimation, is 60 to 65%. The left ventricle has normal function. The left ventricle has no regional wall motion abnormalities. The average left ventricular global longitudinal strain is -22.3 %. The left ventricular internal cavity size was normal in size. There is no left ventricular hypertrophy. Left ventricular diastolic parameters were normal. Right Ventricle: The right ventricular size is normal. Right vetricular wall thickness was not assessed. Right ventricular systolic function is normal. Tricuspid regurgitation signal is inadequate for assessing PA pressure. Left Atrium: Left atrial size was normal in size. Right Atrium: Right atrial size was normal in size. Pericardium: Trivial pericardial effusion is present. Mitral Valve: The mitral valve is normal in structure. Trivial mitral valve regurgitation. Tricuspid Valve: The tricuspid valve is normal in structure. Tricuspid valve regurgitation is not demonstrated. Aortic Valve: The aortic valve is tricuspid. Aortic valve regurgitation is not visualized. No aortic stenosis is present. Pulmonic Valve: The pulmonic valve was not well visualized. Pulmonic valve regurgitation is not visualized. Aorta: The aortic root is normal in size and structure.  Venous: The inferior vena cava is normal in size with greater than 50% respiratory variability, suggesting right atrial pressure of 3 mmHg. IAS/Shunts: The interatrial septum was not well visualized. Additional Comments: There is pleural effusion in the left lateral region.  LEFT VENTRICLE PLAX 2D LVIDd:         4.00 cm     Diastology LVIDs:         2.80 cm     LV e' lateral:   12.40 cm/s LV PW:         0.70 cm     LV E/e' lateral: 7.3 LV IVS:        0.70 cm     LV e' medial:    8.70 cm/s LVOT diam:     1.60 cm     LV E/e' medial:  10.4 LV SV:         36 LV SV Index:   23          2D Longitudinal  Strain LVOT Area:     2.01 cm    2D Strain GLS Avg:     -22.3 %  LV Volumes (MOD) LV vol d, MOD A2C: 83.6 ml LV vol d, MOD A4C: 74.0 ml LV vol s, MOD A2C: 33.0 ml LV vol s, MOD A4C: 37.0 ml LV SV MOD A2C:     50.6 ml LV SV MOD A4C:     74.0 ml LV SV MOD BP:      43.6 ml RIGHT VENTRICLE RV S prime:     13.80 cm/s TAPSE (M-mode): 2.2 cm LEFT ATRIUM             Index       RIGHT ATRIUM          Index LA diam:        4.00 cm 2.60 cm/m  RA Area:     7.07 cm LA Vol (A2C):   17.7 ml 11.50 ml/m RA Volume:   9.87 ml  6.41 ml/m LA Vol (A4C):   26.1 ml 16.96 ml/m LA Biplane Vol: 23.4 ml 15.20 ml/m  AORTIC VALVE LVOT Vmax:   94.60 cm/s LVOT Vmean:  59.100 cm/s LVOT VTI:    0.177 m  AORTA Ao Root diam: 2.90 cm MITRAL VALVE MV Area (PHT): 4.80 cm    SHUNTS MV Decel Time: 158 msec    Systemic VTI:  0.18 m MV E velocity: 90.80 cm/s  Systemic Diam: 1.60 cm MV A velocity: 85.90 cm/s MV E/A ratio:  1.06 Oswaldo Milian MD Electronically signed by Oswaldo Milian MD Signature Date/Time: 04/18/2020/12:07:09 PM    Final    IR IMAGING GUIDED PORT INSERTION  Result Date: 04/15/2020 INDICATION: History of diffuse large B-cell lymphoma. In need of durable intravenous access for chemotherapy administration. EXAM: IMPLANTED PORT A CATH PLACEMENT WITH ULTRASOUND AND FLUOROSCOPIC GUIDANCE COMPARISON:  Chest CT-04/07/2020  MEDICATIONS: Ancef 2 gm IV; The antibiotic was administered within an appropriate time interval prior to skin puncture. ANESTHESIA/SEDATION: Moderate (conscious) sedation was employed during this procedure. A total of Versed 4 mg and Fentanyl 100 mcg was administered intravenously. Moderate Sedation Time: 26 minutes. The patient's level of consciousness and vital signs were monitored continuously by radiology nursing throughout the procedure under my direct supervision. CONTRAST:  None FLUOROSCOPY TIME:  30 seconds (5 mGy) COMPLICATIONS: None immediate. PROCEDURE: The procedure, risks, benefits, and alternatives were explained to the patient. Questions regarding the procedure were encouraged and answered. The patient understands and consents to the procedure. The right neck and chest were prepped with chlorhexidine in a sterile fashion, and a sterile drape was applied covering the operative field. Maximum barrier sterile technique with sterile gowns and gloves were used for the procedure. A timeout was performed prior to the initiation of the procedure. Local anesthesia was provided with 1% lidocaine with epinephrine. After creating a small venotomy incision, a micropuncture kit was utilized to access the internal jugular vein. Real-time ultrasound guidance was utilized for vascular access including the acquisition of a permanent ultrasound image documenting patency of the accessed vessel. The microwire was utilized to measure appropriate catheter length. A subcutaneous port pocket was then created along the upper chest wall utilizing a combination of sharp and blunt dissection. The pocket was irrigated with sterile saline. A single lumen "ISP" sized power injectable port was chosen for placement. The 8 Fr catheter was tunneled from the port pocket site to the venotomy incision. The port was placed in the pocket. The external catheter was trimmed  to appropriate length. At the venotomy, an 8 Fr peel-away sheath was  placed over a guidewire under fluoroscopic guidance. The catheter was then placed through the sheath and the sheath was removed. Final catheter positioning was confirmed and documented with a fluoroscopic spot radiograph. The port was accessed with a Huber needle, aspirated and flushed with heparinized saline. The venotomy site was closed with an interrupted 4-0 Vicryl suture. The port pocket incision was closed with interrupted 2-0 Vicryl suture. The skin was opposed with a running subcuticular 4-0 Vicryl suture. Dermabond and Steri-strips were applied to both incisions. Dressings were applied. The patient tolerated the procedure well without immediate post procedural complication. FINDINGS: After catheter placement, the tip lies within the superior cavoatrial junction. The catheter aspirates and flushes normally and is ready for immediate use. IMPRESSION: Successful placement of a right internal jugular approach power injectable Port-A-Cath. The catheter is ready for immediate use. Electronically Signed   By: Sandi Mariscal M.D.   On: 04/15/2020 15:28   IR THORACENTESIS ASP PLEURAL SPACE W/IMG GUIDE  Result Date: 04/15/2020 INDICATION: Recent diagnosis diffuse large B-cell lymphoma, now with recurrent left-sided pleural effusion. Please perform ultrasound-guided thoracentesis for therapeutic purposes prior to PET-CT. EXAM: IR THORACENTESIS ASP PLEURAL SPACE W/IMG GUIDE COMPARISON:  Chest CT-04/07/2020 MEDICATIONS: None. COMPLICATIONS: None immediate. TECHNIQUE: Informed written consent was obtained from the patient after a discussion of the risks, benefits and alternatives to treatment. A timeout was performed prior to the initiation of the procedure. Initial ultrasound scanning demonstrates a small left-sided anechoic pleural effusion. The lower chest was prepped and draped in the usual sterile fashion. 1% lidocaine was used for local anesthesia. An ultrasound image was saved for documentation purposes. An 8 Fr  Safe-T-Centesis catheter was introduced. The thoracentesis was performed. The catheter was removed and a dressing was applied. The patient tolerated the procedure well without immediate post procedural complication. The patient was escorted to have an upright chest radiograph. FINDINGS: A total of approximately 200 cc of orange colored pleural fluid was removed. IMPRESSION: Successful ultrasound-guided left sided thoracentesis yielding 200 cc orange colored pleural fluid. Electronically Signed   By: Sandi Mariscal M.D.   On: 04/15/2020 16:18   US THORACENTESIS ASP PLEURAL SPACE W/IMG GUIDE  Result Date: 04/08/2020 INDICATION: Patient with history of HIV, pancreatic mass, retroperitoneal adenopathy, ascites, splenic masses, left pleural effusion. Request received for diagnostic and therapeutic left thoracentesis. EXAM: ULTRASOUND GUIDED DIAGNOSTIC AND THERAPEUTIC LEFT THORACENTESIS MEDICATIONS: None COMPLICATIONS: None immediate. PROCEDURE: An ultrasound guided thoracentesis was thoroughly discussed with the patient and questions answered. The benefits, risks, alternatives and complications were also discussed. The patient understands and wishes to proceed with the procedure. Written consent was obtained. Ultrasound was performed to localize and mark an adequate pocket of fluid in the left chest. The area was then prepped and draped in the normal sterile fashion. 1% Lidocaine was used for local anesthesia. Under ultrasound guidance a 6 Fr Safe-T-Centesis catheter was introduced. Thoracentesis was performed. The catheter was removed and a dressing applied. FINDINGS: A total of approximately 1.5 liters of blood-tinged fluid was removed. Samples were sent to the laboratory as requested by the clinical team. Due to patient chest discomfort and coughing only the above amount of fluid was removed today. IMPRESSION: Successful ultrasound guided diagnostic and therapeutic left thoracentesis yielding 1.5 liters of pleural  fluid. Read by: Rowe Robert, PA-C Electronically Signed   By: Jacqulynn Cadet M.D.   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 No internal necrosis within the mass noted. Diffuse splenomegaly. Left periaortic lymph node causing mild displacement of the third portion of the duodenum.  Significantly elevated LDH levels. 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 -Baseline echocardiogram showed an LVEF of 60 to 65%.  # 2 Left sided large pleural effusion and lung atelectasis. S/p diagnostic and therapeutic thoracentesis -- lymphocytic predominance @ 80%  HIV could be risk factors for high grade EBV driven lymphomas  Patient symptomatology has developed rather quickly over the last few weeks.  #2mcrocytic anemia-chronic.Some element of iron deficiency versus hemoglobinopathy versus anemia of chronic disease related to malignancy. Patient has received 1 dose of IV Feraheme on 04/08/2020 She is due for a repeat dose and may consider giving this as an inpatient later this week  #4history of HIV/AIDS follows [facility]   PLAN: -Labs from this morning have been reviewed and are adequate to continue treatment.  She will proceed with day 4 of cycle one of her chemotherapy today as planned. -Potassium level is low today and will start K-Dur 20 mEq daily. -We will  follow CBC with differential, CMET, uric acid, and phosphorus levels daily. -Continue allopurinol 100 mg twice a day. -Continue as needed antiemetics. -mild constipation -MiraLAX 17 g daily. -as needed Tylenol and ibuprofen for the patient for her abdominal pain.  She does not want anything stronger. -outpatient Udenyca and Rituxan on 04/25/2020 -no prohibitive toxicities that preclude continuing EPOCH C1D4 today. -Plan for discharged home on Friday, 04/22/2020.  KMikey Bussing DNP, AGPCNP-BC, AOCNP Mon/Tues/Thurs/Fri 7am-5pm; Off Wednesdays Cell: [phone]    LOS: 3 days     ADDENDUM  .Patient was Personally and independently interviewed, examined and relevant elements of the history of present illness were reviewed in details and an assessment and plan was created. All elements of the patient's history of present illness , assessment and plan were discussed in details with KMikey Bussing DNP, AGPCNP-BC, AOCNP. The above documentation reflects our combined findings assessment and plan.  GSullivan LoneMD MS

## 2020-04-22 ENCOUNTER — Other Ambulatory Visit: Payer: Self-pay | Admitting: Oncology

## 2020-04-22 DIAGNOSIS — D509 Iron deficiency anemia, unspecified: Secondary | ICD-10-CM

## 2020-04-22 DIAGNOSIS — C8338 Diffuse large B-cell lymphoma, lymph nodes of multiple sites: Secondary | ICD-10-CM

## 2020-04-22 LAB — CBC WITH DIFFERENTIAL/PLATELET
Abs Immature Granulocytes: 0.04 10*3/uL (ref 0.00–0.07)
Basophils Absolute: 0 10*3/uL (ref 0.0–0.1)
Basophils Relative: 0 %
Eosinophils Absolute: 0 10*3/uL (ref 0.0–0.5)
Eosinophils Relative: 0 %
HCT: 30.4 % — ABNORMAL LOW (ref 36.0–46.0)
Hemoglobin: 9.5 g/dL — ABNORMAL LOW (ref 12.0–15.0)
Immature Granulocytes: 1 %
Lymphocytes Relative: 3 %
Lymphs Abs: 0.2 10*3/uL — ABNORMAL LOW (ref 0.7–4.0)
MCH: 22.2 pg — ABNORMAL LOW (ref 26.0–34.0)
MCHC: 31.3 g/dL (ref 30.0–36.0)
MCV: 71 fL — ABNORMAL LOW (ref 80.0–100.0)
Monocytes Absolute: 0 10*3/uL — ABNORMAL LOW (ref 0.1–1.0)
Monocytes Relative: 0 %
Neutro Abs: 6 10*3/uL (ref 1.7–7.7)
Neutrophils Relative %: 96 %
Platelets: 451 10*3/uL — ABNORMAL HIGH (ref 150–400)
RBC: 4.28 MIL/uL (ref 3.87–5.11)
RDW: 20.3 % — ABNORMAL HIGH (ref 11.5–15.5)
WBC: 6.3 10*3/uL (ref 4.0–10.5)
nRBC: 0 % (ref 0.0–0.2)

## 2020-04-22 LAB — COMPREHENSIVE METABOLIC PANEL
ALT: 14 U/L (ref 0–44)
AST: 31 U/L (ref 15–41)
Albumin: 2.9 g/dL — ABNORMAL LOW (ref 3.5–5.0)
Alkaline Phosphatase: 68 U/L (ref 38–126)
Anion gap: 9 (ref 5–15)
BUN: 15 mg/dL (ref 6–20)
CO2: 29 mmol/L (ref 22–32)
Calcium: 9.7 mg/dL (ref 8.9–10.3)
Chloride: 98 mmol/L (ref 98–111)
Creatinine, Ser: 0.59 mg/dL (ref 0.44–1.00)
GFR calc Af Amer: >60 mL/min (ref >60)
GFR calc non Af Amer: >60 mL/min (ref >60)
Glucose, Bld: 116 mg/dL — ABNORMAL HIGH (ref 70–99)
Potassium: 3.5 mmol/L (ref 3.5–5.1)
Sodium: 136 mmol/L (ref 135–145)
Total Bilirubin: 0.7 mg/dL (ref 0.3–1.2)
Total Protein: 6 g/dL — ABNORMAL LOW (ref 6.5–8.1)

## 2020-04-22 LAB — PHOSPHORUS: Phosphorus: 3.1 mg/dL (ref 2.5–4.6)

## 2020-04-22 LAB — URIC ACID: Uric Acid, Serum: 4.8 mg/dL (ref 2.5–7.1)

## 2020-04-22 MED ORDER — DEXAMETHASONE 4 MG PO TABS
ORAL_TABLET | ORAL | 0 refills | Status: DC
Start: 2020-04-22 — End: 2020-05-10

## 2020-04-22 MED ORDER — POTASSIUM CHLORIDE CRYS ER 20 MEQ PO TBCR: 20.0000 meq | EXTENDED_RELEASE_TABLET | Freq: Every day | ORAL | 0 refills | Status: DC

## 2020-04-22 MED ORDER — POLYETHYLENE GLYCOL 3350 17 G PO PACK: 17.0000 g | PACK | Freq: Every day | ORAL | 0 refills | Status: AC | PRN

## 2020-04-22 MED ORDER — SODIUM CHLORIDE 0.9 % IV SOLN
Freq: Once | INTRAVENOUS | Status: AC
  Administered 2020-04-22: 36 mg via INTRAVENOUS
  Filled 2020-04-22: qty 8

## 2020-04-22 MED ORDER — SODIUM BICARBONATE/SODIUM CHLORIDE MOUTHWASH: 1.0000 "application " | Freq: Four times a day (QID) | OROMUCOSAL | Status: DC

## 2020-04-22 MED ORDER — ENSURE ENLIVE PO LIQD: 237.0000 mL | Freq: Two times a day (BID) | ORAL | 12 refills | Status: AC

## 2020-04-22 MED ORDER — SODIUM CHLORIDE 0.9 % IV SOLN
750.0000 mg/m2 | Freq: Once | INTRAVENOUS | Status: AC
  Administered 2020-04-22: 1180 mg via INTRAVENOUS
  Filled 2020-04-22: qty 59

## 2020-04-22 NOTE — Progress Notes (Signed)
Pt discharged home with spouse in stable condition. Discharge instructions given. Scripts sent to pharmacy of choice. No immediate questions or concerns at this time. Discharged from unit via wheelchair.  

## 2020-04-22 NOTE — Discharge Summary (Addendum)
Physician Discharge Summary  Patient ID: Heather [name] MRN: [id number] DOB/AGE: January 22, 1976 44 y.o.  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)   At high risk of tumor lysis syndrome   Encounter for antineoplastic chemotherapy   Malnutrition of moderate degree   Discharged Condition: good  Discharge Labs:    CBC    Component Value Date/Time   WBC 6.3 04/22/2020 0621   RBC 4.28 04/22/2020 0621   HGB 9.5 (L) 04/22/2020 0621   HCT 30.4 (L) 04/22/2020 0621   PLT 451 (H) 04/22/2020 0621   MCV 71.0 (L) 04/22/2020 0621   MCH 22.2 (L) 04/22/2020 0621   MCHC 31.3 04/22/2020 0621   RDW 20.3 (H) 04/22/2020 0621   LYMPHSABS 0.2 (L) 04/22/2020 0621   MONOABS 0.0 (L) 04/22/2020 0621   EOSABS 0.0 04/22/2020 0621   BASOSABS 0.0 04/22/2020 0621   CMP Latest Ref Rng & Units 04/22/2020 04/21/2020 04/20/2020  Glucose 70 - 99 mg/dL 116(H) 114(H) 220(H)  BUN 6 - 20 mg/dL 15 17 17   Creatinine 0.44 - 1.00 mg/dL 0.59 0.59 0.50  Sodium 135 - 145 mmol/L 136 136 137  Potassium 3.5 - 5.1 mmol/L 3.5 3.4(L) 3.5  Chloride 98 - 111 mmol/L 98 98 98  CO2 22 - 32 mmol/L 29 26 26   Calcium 8.9 - 10.3 mg/dL 9.7 9.7 9.9  Total Protein 6.5 - 8.1 g/dL 6.0(L) 6.3(L) 6.3(L)  Total Bilirubin 0.3 - 1.2 mg/dL 0.7 0.5 0.2(L)  Alkaline Phos 38 - 126 U/L 68 75 74  AST 15 - 41 U/L 31 33 30  ALT 0 - 44 U/L 14 14 15     Consults: None  Procedures: Echocardiogram performed on 04/18/2020 showed LVEF of 60 to 65%  Disposition: Discharge disposition: 01-Home or Self Care      Allergies as of 04/22/2020   No Known Allergies     Medication List    STOP taking these medications   predniSONE 20 MG tablet Commonly known as: DELTASONE     TAKE these medications   acetaminophen 325 MG tablet Commonly known as: TYLENOL Take  2 tablets (650 mg total) by mouth every 4 (four) hours as needed for mild pain.   allopurinol 100 MG tablet Commonly known as: ZYLOPRIM Take 1 tablet (100 mg total) by mouth 2 (two) times daily.   Biktarvy 50-200-25 MG Tabs tablet Generic drug: bictegravir-emtricitabine-tenofovir AF Take 1 tablet by mouth daily with breakfast.   cholecalciferol 25 MCG (1000 UNIT) tablet Commonly known as: VITAMIN D3 Take 1,000 Units by mouth at bedtime.   dexamethasone 4 MG tablet Commonly known as: DECADRON Take 1 tab twice a day for 2 days following each cycle of chemo   feeding supplement (ENSURE ENLIVE) Liqd Take 237 mLs by mouth 2 (two) times daily between meals.   ibuprofen 200 MG tablet Commonly known as: ADVIL Take 400 mg by mouth every 6 (six) hours as needed for mild pain.   lidocaine-prilocaine cream Commonly known as: EMLA Apply to affected area once   LORazepam 0.5 MG tablet Commonly known as: ATIVAN Take 1 tablet (0.5 mg total) by mouth every 6 (six) hours as needed (for chemo induced nausea or vomiting).   multivitamin with minerals Tabs tablet Take 1 tablet by mouth at bedtime.   ondansetron 8 MG tablet Commonly known as: ZOFRAN  Take 1 tablet (8 mg total) by mouth every 8 (eight) hours as needed for nausea or vomiting.   pantoprazole 40 MG tablet Commonly known as: PROTONIX Take 1 tablet (40 mg total) by mouth daily for 14 days.   polyethylene glycol 17 g packet Commonly known as: MIRALAX / GLYCOLAX Take 17 g by mouth daily as needed for mild constipation.   potassium chloride SA 20 MEQ tablet Commonly known as: KLOR-CON Take 1 tablet (20 mEq total) by mouth daily. Start taking on: April 23, 2020   prochlorperazine 10 MG tablet Commonly known as: COMPAZINE Take 1 tablet (10 mg total) by mouth every 6 (six) hours as needed for nausea or vomiting.   sodium bicarbonate/sodium chloride Soln 1 application by Mouth Rinse route 4 (four) times daily.       HPI:  Heather [name] is a wonderful 44 y.o. female with a past medical history significant for hepatitis B, HIV, chronic anemia who presented to [facility] with abdominal pain. She had a 2-week history of early satiety, poor appetite and, 5 pound weight loss. Abdominal pain is typically postprandial and mainly at night after dinner while laying in bed. Has been associated with nausea improves after vomiting. Labs on admission showed a hemoglobin of 9.3, MCV 67.4, platelet count 554,000. Lipase was mildly elevated at 123. 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  During her initial consult, she was having ongoingpain is primarily in her epigastric area and radiates to the left side of her abdomen. States pain is worse when laying down after she eats. She states that she has had this pain for at least 2 weeks. Her abdominal pain is worse after eating. She has had some nausea and intermittent vomiting. Vomiting makes her pain better. She was attributing her symptoms to some the medications that she was taking for fertility treatments. She reports a poor appetite and about a 5 pound weight loss. She is not having any fevers or chills. Denies night sweats. She has not noticed any palpable lymphadenopathy. The patient underwent a biopsy of her pancreatic mass on 04/08/2020 ([id number]) with results feeling diffuse large B-cell lymphoma.  It was recommended for her to undergo systemic chemotherapy with EPOCH-R.   On the day of admission, the patient reported mild  abdominal discomfort which comes and goes.  Abdominal pain was improved after vomiting.  She had vomited the night prior to admission but did not have any nausea or vomiting on the day of admission.  Her shortness of breath had improved following thoracentesis the Friday before admission.  Appetite was improving and she had no other complaints on the morning of admission. She was admitted for cycle #1 of EPOCH-R.   Hospital Course:  A stat echocardiogram was performed on the day of admission in anticipation of starting chemotherapy.  LVEF was 60 to 65%.  Her chemotherapy was started as planned on the day of admission.  During her hospitalization, she had some mild abdominal discomfort which improved significantly by the day of discharge.  She initially required as needed ibuprofen but has not been needing this with continued improvement of her abdominal discomfort.  She  had mild constipation during admission and received MiraLAX.  She also had mild, intermittent nausea without any vomiting and use as needed antiemetics which were effective.  She developed mild hypokalemia during her hospitalization and was started on potassium chloride 20 mEq daily with improvement of her potassium.  The patient also has had persistent iron deficiency anemia and a dose of Feraheme 510 mg IV was administered on the day of discharge.  The patient will continue on allopurinol 100 mg twice a day-prescription sent to her local pharmacy.  Additionally prescriptions for as needed antiemetics including Compazine, Zofran, and Ativan have been sent to her pharmacy.  She will receive a dose of K-Dur 20 mEq prior to discharge and will take an additional dose on 04/23/2020 and a dose on 04/24/2020.  The patient will stop prednisone.  A prescription for dexamethasone 4 mg twice daily to be taken for 2 days following each cycle of chemotherapy has been sent to her pharmacy. The patient will return to the cancer center on Monday, 04/25/2020 for  Rituxan and Neulasta and she will have a follow-up visit with labs on 04/28/2020.   Discharge Instructions    Activity as tolerated - No restrictions   Complete by: As directed    Diet general   Complete by: As directed       Signed: Mikey Bussing 04/22/2020, 9:47 AM   ADDENDUM  .Patient was Personally and independently interviewed, examined and relevant elements of the labs and discharge plan were reviewed in details. All elements of the patient's discharge plan were discussed in details with Mikey Bussing. The above documentation reflects our combined findings assessment and plan.  -outpatient Rituxan and Udenyca on 6/21 -labs and clinic f/u with Dr Irene Limbo  On 6/24  Sullivan Lone MD MS TT spent discharging patient and co-ordinating cares >30 mins

## 2020-04-25 ENCOUNTER — Inpatient Hospital Stay: Payer: 59

## 2020-04-25 ENCOUNTER — Other Ambulatory Visit: Payer: Self-pay

## 2020-04-25 ENCOUNTER — Other Ambulatory Visit: Payer: Self-pay | Admitting: Hematology

## 2020-04-25 VITALS — BP 148/92 | HR 82 | Temp 98.9°F | Resp 16 | Wt 121.8 lb

## 2020-04-25 DIAGNOSIS — Z20822 Contact with and (suspected) exposure to covid-19: Secondary | ICD-10-CM | POA: Diagnosis not present

## 2020-04-25 DIAGNOSIS — Z5112 Encounter for antineoplastic immunotherapy: Secondary | ICD-10-CM | POA: Diagnosis present

## 2020-04-25 DIAGNOSIS — Z7189 Other specified counseling: Secondary | ICD-10-CM

## 2020-04-25 DIAGNOSIS — C8338 Diffuse large B-cell lymphoma, lymph nodes of multiple sites: Secondary | ICD-10-CM | POA: Diagnosis not present

## 2020-04-25 DIAGNOSIS — D649 Anemia, unspecified: Secondary | ICD-10-CM | POA: Diagnosis not present

## 2020-04-25 DIAGNOSIS — B20 Human immunodeficiency virus [HIV] disease: Secondary | ICD-10-CM | POA: Diagnosis not present

## 2020-04-25 DIAGNOSIS — Z5189 Encounter for other specified aftercare: Secondary | ICD-10-CM | POA: Diagnosis not present

## 2020-04-25 MED ORDER — ACETAMINOPHEN 325 MG PO TABS
650.0000 mg | ORAL_TABLET | Freq: Once | ORAL | Status: AC
  Administered 2020-04-25: 650 mg via ORAL

## 2020-04-25 MED ORDER — HEPARIN SOD (PORK) LOCK FLUSH 100 UNIT/ML IV SOLN
500.0000 [IU] | Freq: Once | INTRAVENOUS | Status: AC | PRN
  Administered 2020-04-25: 500 [IU]
  Filled 2020-04-25: qty 5

## 2020-04-25 MED ORDER — HYDROXYZINE HCL 25 MG PO TABS
25.0000 mg | ORAL_TABLET | Freq: Once | ORAL | Status: AC
  Administered 2020-04-25: 25 mg via ORAL
  Filled 2020-04-25: qty 1

## 2020-04-25 MED ORDER — DIPHENHYDRAMINE HCL 25 MG PO CAPS: 50.0000 mg | ORAL_CAPSULE | Freq: Once | ORAL | Status: DC

## 2020-04-25 MED ORDER — SODIUM CHLORIDE 0.9 % IV SOLN
375.0000 mg/m2 | Freq: Once | INTRAVENOUS | Status: AC
  Administered 2020-04-25: 600 mg via INTRAVENOUS
  Filled 2020-04-25: qty 10

## 2020-04-25 MED ORDER — ACETAMINOPHEN 325 MG PO TABS
ORAL_TABLET | ORAL | Status: AC
  Filled 2020-04-25: qty 2

## 2020-04-25 MED ORDER — SODIUM CHLORIDE 0.9 % IV SOLN
Freq: Once | INTRAVENOUS | Status: AC
  Filled 2020-04-25: qty 250

## 2020-04-25 MED ORDER — PEGFILGRASTIM-CBQV 6 MG/0.6ML ~~LOC~~ SOSY
6.0000 mg | PREFILLED_SYRINGE | Freq: Once | SUBCUTANEOUS | Status: AC
  Administered 2020-04-25: 6 mg via SUBCUTANEOUS

## 2020-04-25 MED ORDER — DIPHENHYDRAMINE HCL 25 MG PO CAPS
ORAL_CAPSULE | ORAL | Status: AC
  Filled 2020-04-25: qty 2

## 2020-04-25 MED ORDER — SODIUM CHLORIDE 0.9% FLUSH
10.0000 mL | INTRAVENOUS | Status: DC | PRN
  Administered 2020-04-25: 10 mL
  Filled 2020-04-25: qty 10

## 2020-04-25 MED ORDER — PEGFILGRASTIM-CBQV 6 MG/0.6ML ~~LOC~~ SOSY
PREFILLED_SYRINGE | SUBCUTANEOUS | Status: AC
  Filled 2020-04-25: qty 0.6

## 2020-04-25 NOTE — Progress Notes (Signed)
Patient states she has had side effects from diphenhydramine in the past consisting of feeling jittery. Per Irene Limbo, change to hydroxyzine today. If patient still has side effect from that and does okay with rituximab, consider changing to loratadine in the future.   Demetrius Charity, PharmD, BCPS, [facility] Pharmacist Pharmacy Phone: [phone] 04/25/2020

## 2020-04-25 NOTE — Patient Instructions (Signed)
[place] Discharge Instructions for Patients Receiving Chemotherapy  Today you received the following Immunotherapy agent: Rituximab  To help prevent nausea and vomiting after your treatment, we encourage you to take your nausea medication as directed by your MD.   If you develop nausea and vomiting that is not controlled by your nausea medication, call the clinic.   BELOW ARE SYMPTOMS THAT SHOULD BE REPORTED IMMEDIATELY:  *FEVER GREATER THAN 100.5 F  *CHILLS WITH OR WITHOUT FEVER  NAUSEA AND VOMITING THAT IS NOT CONTROLLED WITH YOUR NAUSEA MEDICATION  *UNUSUAL SHORTNESS OF BREATH  *UNUSUAL BRUISING OR BLEEDING  TENDERNESS IN MOUTH AND THROAT WITH OR WITHOUT PRESENCE OF ULCERS  *URINARY PROBLEMS  *BOWEL PROBLEMS  UNUSUAL RASH Items with * indicate a potential emergency and should be followed up as soon as possible.  Feel free to call the clinic should you have any questions or concerns. The clinic phone number is [phone].  Please show the [facility] at check-in to the Emergency Department and triage nurse.  Pegfilgrastim injection What is this medicine? PEGFILGRASTIM (PEG fil gra stim) is a long-acting granulocyte colony-stimulating factor that stimulates the growth of neutrophils, a type of white blood cell important in the body's fight against infection. It is used to reduce the incidence of fever and infection in patients with certain types of cancer who are receiving chemotherapy that affects the bone marrow, and to increase survival after being exposed to high doses of radiation. This medicine may be used for other purposes; ask your health care provider or pharmacist if you have questions. COMMON BRAND NAME(S): Steve Rattler, Ziextenzo What should I tell my health care provider before I take this medicine? They need to know if you have any of these conditions:  kidney disease  latex allergy  ongoing radiation  therapy  sickle cell disease  skin reactions to acrylic adhesives (On-Body Injector only)  an unusual or allergic reaction to pegfilgrastim, filgrastim, other medicines, foods, dyes, or preservatives  pregnant or trying to get pregnant  breast-feeding How should I use this medicine? This medicine is for injection under the skin. If you get this medicine at home, you will be taught how to prepare and give the pre-filled syringe or how to use the On-body Injector. Refer to the patient Instructions for Use for detailed instructions. Use exactly as directed. Tell your healthcare provider immediately if you suspect that the On-body Injector may not have performed as intended or if you suspect the use of the On-body Injector resulted in a missed or partial dose. It is important that you put your used needles and syringes in a special sharps container. Do not put them in a trash can. If you do not have a sharps container, call your pharmacist or healthcare provider to get one. Talk to your pediatrician regarding the use of this medicine in children. While this drug may be prescribed for selected conditions, precautions do apply. Overdosage: If you think you have taken too much of this medicine contact a poison control center or emergency room at once. NOTE: This medicine is only for you. Do not share this medicine with others. What if I miss a dose? It is important not to miss your dose. Call your doctor or health care professional if you miss your dose. If you miss a dose due to an On-body Injector failure or leakage, a new dose should be administered as soon as possible using a single prefilled syringe for manual use.  What may interact with this medicine? Interactions have not been studied. Give your health care provider a list of all the medicines, herbs, non-prescription drugs, or dietary supplements you use. Also tell them if you smoke, drink alcohol, or use illegal drugs. Some items may interact  with your medicine. This list may not describe all possible interactions. Give your health care provider a list of all the medicines, herbs, non-prescription drugs, or dietary supplements you use. Also tell them if you smoke, drink alcohol, or use illegal drugs. Some items may interact with your medicine. What should I watch for while using this medicine? You may need blood work done while you are taking this medicine. If you are going to need a MRI, CT scan, or other procedure, tell your doctor that you are using this medicine (On-Body Injector only). What side effects may I notice from receiving this medicine? Side effects that you should report to your doctor or health care professional as soon as possible:  allergic reactions like skin rash, itching or hives, swelling of the face, lips, or tongue  back pain  dizziness  fever  pain, redness, or irritation at site where injected  pinpoint red spots on the skin  red or dark-brown urine  shortness of breath or breathing problems  stomach or side pain, or pain at the shoulder  swelling  tiredness  trouble passing urine or change in the amount of urine Side effects that usually do not require medical attention (report to your doctor or health care professional if they continue or are bothersome):  bone pain  muscle pain This list may not describe all possible side effects. Call your doctor for medical advice about side effects. You may report side effects to FDA at 1-800-FDA-1088. Where should I keep my medicine? Keep out of the reach of children. If you are using this medicine at home, you will be instructed on how to store it. Throw away any unused medicine after the expiration date on the label. NOTE: This sheet is a summary. It may not cover all possible information. If you have questions about this medicine, talk to your doctor, pharmacist, or health care provider.  2020 Elsevier/Gold Standard (2018-01-27 16:57:08)

## 2020-04-25 NOTE — Progress Notes (Signed)
Addendum:   Rapid Infusion Rituximab Pharmacist Evaluation  Heather [name] is a 44 y.o. female being treated with rituximab for DLBCL. This patient may be considered for RIR.   A pharmacist has verified the patient tolerated rituximab infusions per the [facility] standard infusion protocol without grade 3-4 infusion reactions. The treatment plan will be updated to reflect RIR if the patient qualifies per the checklist below:   Age > 9 years old Yes   Clinically significant cardiovascular disease No   Circulating lymphocyte count < 5000/uL prior to cycle two Yes  Lab Results  Component Value Date   LYMPHSABS 0.2 (L) 04/22/2020    Prior documented grade 3-4 infusion reaction to rituximab No   Prior documented grade 1-2 infusion reaction to rituximab (If YES, Pharmacist will confirm with Physician if patient is still a candidate for RIR) No   Previous rituximab infusion within the past 6 months Yes   Treatment Plan updated orders to reflect RIR Yes    Heather [name] does meet the criteria for Rapid Infusion Rituximab. This patient is going to be switched to rapid infusion rituximab.   Of note, patient also tolerated hydroxyzine premedication well, without "jittery" sensation as previously experience with diphenhydramine.   Demetrius Charity, PharmD, BCPS, [facility] Pharmacist Pharmacy Phone: [phone] 04/25/2020

## 2020-04-26 ENCOUNTER — Encounter (HOSPITAL_COMMUNITY): Payer: Self-pay | Admitting: Hematology

## 2020-04-27 NOTE — Progress Notes (Signed)
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 female with a past medical history significant for hepatitis B, HIV, chronic anemia who presented to [facility] with abdominal pain.  She had a 2-week history of early satiety, poor appetite and, 5 pound weight loss.  Abdominal pain is typically postprandial and mainly at night after dinner while laying in bed.  Has been associated with nausea improves after vomiting.  Labs on admission showed a hemoglobin of 9.3, MCV 67.4, platelet count 554,000.  Lipase was mildly elevated at 123.  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  Husband was at the bedside at the time of the visit.  The patient reports that she is having ongoing abdominal discomfort today.  Pain is primarily in her epigastric area and radiates to the left side of her abdomen.  States pain is worse when laying down after she eats.  She states that she has had this pain for at least 2 weeks.  Her abdominal pain is worse after eating.  She has had some nausea and intermittent vomiting.  Vomiting makes her pain better.  She was attributing her symptoms to some the medications that she was taking for fertility treatments.  She reports a poor appetite and about a 5 pound weight loss.  She is not  having any fevers or chills.  Denies night sweats.  She has not noticed any palpable lymphadenopathy.  She denies headaches, dizziness, chest pain, shortness of breath, diarrhea.  She reports intermittent constipation.  Denies bleeding or lower extremity edema.  The patient is married and has no children.  She currently works as an Therapist, sports.  Denies alcohol tobacco use.  Family history significant for paternal grandmother with uterine cancer.  Medical oncology was asked see the patient to make recommendations regarding her abnormal CT scan findings.  INTERVAL HISTORY:  Heather [name] is a 44 year old female who is here for evaluation and management of Pancreatic mass with splenic metastases. We are joined by Mr. Collantes. The patient's last visit with [place] was on 04/12/20. The pt reports that she is doing well overall.  The pt reports she is good. The Rituxan made her very fatigued. She got Udenyca on 06/21 and has not had bone pains yet. Pt has been using mouth wash to help with mouth soars. She is eating better but has a less of an appetite. Pt has been having tingling in the fingertips.    Of note since the patient's last visit, pt has had PET Image Initial Skull Base to Thigh ([id number]) completed on 04/15/20 with results revealing "1. Large left upper quadrant mass is markedly hypermetabolic and consistent with patient's known lymphoma. 2. No findings to suggest involvement of the neck, chest or pelvis or bony structures. 3. Enlarged fibroid uterus. 4. Midline anterior abdominal skin lesions. Mild hypermetabolism. These could reflect some type of skin blisters. I suppose Kaposi's sarcoma would be  another consideration given the patient's HIV status." Pt had Echocardiogram ([id number])  completed on 04/18/20 with results revealing "1. Left ventricular ejection fraction, by estimation, is 60 to 65%. The left ventricle has normal function. The left ventricle has no regional wall motion abnormalities. Left  ventricular diastolic parameters were normal. The average left ventricular global longitudinal strain is -22.3 %. 2. Right ventricular systolic function is normal. The right ventricular size is normal. Tricuspid regurgitation signal is inadequate for assessing PA pressure. 3. The mitral valve is normal in structure. Trivial mitral valve regurgitation. 4. The aortic valve is tricuspid. Aortic valve regurgitation is not visualized. No aortic stenosis is present. 5. The inferior vena cava is normal in size with"  Lab results today (04/28/20) of CBC w/diff and CMP is as follows: all values are WNL except for WBC at 0.6K, Hemoglobin at 9.9, HCT at 31.4, MCV at 69.9, MCH at 22, RDW at 20, Neutro Abs at 0.3K, Lymph Abs at 0.2K, Potassium at 3.4, Glucose at 137, Total Protein at 6.3, Albumin at 3.3, AST at 11 04/28/20 of LDH at 443 04/28/20 of Uric Acid at 3.9: WNL  On review of systems, pt reports tingling in finger tips and denies constipation, irregular bowl movements, mouth soars, abdominal pain, changes in breathing, and any other symptoms.   MEDICAL HISTORY:  Past Medical History:  Diagnosis Date  . Anemia   . Diffuse large B-cell lymphoma of lymph nodes of multiple regions ([facility]) 04/12/2020  . 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 History of blood transfusion    childhood  . HIV (human immunodeficiency virus infection) ([place])   . Immune deficiency disorder [facility])     SURGICAL HISTORY: Past Surgical History:  Procedure Laterality Date  . CHROMOPERTUBATION Bilateral 11/29/2016   Procedure: CHROMOPERTUBATION;  Surgeon: Waymon Amato, MD;  Location: [facility];  Service: Gynecology;  Laterality: Bilateral;  fallopian tubes  . ENDOMETRIAL BIOPSY    . IR IMAGING GUIDED PORT INSERTION  04/15/2020  . IR THORACENTESIS ASP PLEURAL SPACE W/IMG GUIDE  04/15/2020  . MYOMECTOMY N/A 11/29/2016   Procedure: MYOMECTOMY;  Surgeon: Waymon Amato, MD;  Location: [facility];  Service: Gynecology;  Laterality: N/A;    SOCIAL  HISTORY: Social History   Socioeconomic History  . Marital status: Married    Spouse name: Not on file  . Number of children: Not on file  . Years of education: Not on file  . Highest education level: Not on file  Occupational History  . Not on file  Tobacco Use  . Smoking status: Never Smoker  . Smokeless tobacco: Never Used  Substance and Sexual Activity  . Alcohol use: Yes    Comment: occ  . Drug use: No  . Sexual activity: Yes  Other Topics Concern  . Not on file  Social History Narrative  . Not on file   Social Determinants of Health   Financial Resource Strain:   . Difficulty of Paying Living Expenses:   Food Insecurity:   . Worried About Charity fundraiser in the Last Year:   . Arboriculturist in the Last Year:   Transportation Needs:   . Film/video editor (Medical):   Marland Kitchen Lack of Transportation (Non-Medical):   Physical Activity:   . Days of Exercise per Week:   . Minutes of Exercise per Session:   Stress:   . Feeling of Stress :   Social Connections:   . Frequency of Communication with Friends  and Family:   . Frequency of Social Gatherings with Friends and Family:   . Attends Religious Services:   . Active Member of Clubs or Organizations:   . Attends Archivist Meetings:   Marland Kitchen Marital Status:   Intimate Partner Violence:   . Fear of Current or Ex-Partner:   . Emotionally Abused:   Marland Kitchen Physically Abused:   . Sexually Abused:     FAMILY HISTORY: Family History  Problem Relation Age of Onset  . Diabetes Mother   . Hypertension Mother     ALLERGIES:  has No Known Allergies.  MEDICATIONS:  Current Outpatient Medications  Medication Sig Dispense Refill  . acetaminophen (TYLENOL) 325 MG tablet Take 2 tablets (650 mg total) by mouth every 4 (four) hours as needed for mild pain.    Marland Kitchen allopurinol (ZYLOPRIM) 100 MG tablet Take 1 tablet (100 mg total) by mouth 2 (two) times daily. 60 tablet 1  . bictegravir-emtricitabine-tenofovir AF  (BIKTARVY) 50-200-25 MG TABS tablet Take 1 tablet by mouth daily with breakfast.     . cholecalciferol (VITAMIN D3) 25 MCG (1000 UNIT) tablet Take 1,000 Units by mouth at bedtime.     Marland Kitchen dexamethasone (DECADRON) 4 MG tablet Take 1 tab twice a day for 2 days following each cycle of chemo 30 tablet 0  . feeding supplement, ENSURE ENLIVE, (ENSURE ENLIVE) LIQD Take 237 mLs by mouth 2 (two) times daily between meals. 237 mL 12  . ibuprofen (ADVIL) 200 MG tablet Take 400 mg by mouth every 6 (six) hours as needed for mild pain.    Marland Kitchen lidocaine-prilocaine (EMLA) cream Apply to affected area once 30 g 3  . LORazepam (ATIVAN) 0.5 MG tablet Take 1 tablet (0.5 mg total) by mouth every 6 (six) hours as needed (for chemo induced nausea or vomiting). 30 tablet 0  . Multiple Vitamin (MULTIVITAMIN WITH MINERALS) TABS tablet Take 1 tablet by mouth at bedtime.     Marland Kitchen nystatin (MYCOSTATIN) 100000 UNIT/ML suspension Take 5 mLs (500,000 Units total) by mouth 4 (four) times daily for 10 days. Swish in the mouth/throat and swallow. 200 mL 0  . ondansetron (ZOFRAN) 8 MG tablet Take 1 tablet (8 mg total) by mouth every 8 (eight) hours as needed for nausea or vomiting. 20 tablet 0  . pantoprazole (PROTONIX) 40 MG tablet Take 1 tablet (40 mg total) by mouth daily for 14 days. 14 tablet 0  . polyethylene glycol (MIRALAX / GLYCOLAX) 17 g packet Take 17 g by mouth daily as needed for mild constipation. 14 each 0  . potassium chloride SA (KLOR-CON) 20 MEQ tablet Take 1 tablet (20 mEq total) by mouth daily. 2 tablet 0  . prochlorperazine (COMPAZINE) 10 MG tablet Take 1 tablet (10 mg total) by mouth every 6 (six) hours as needed for nausea or vomiting. 30 tablet 1  . Sodium Chloride-Sodium Bicarb (SODIUM BICARBONATE/SODIUM CHLORIDE) SOLN 1 application by Mouth Rinse route 4 (four) times daily.     No current facility-administered medications for this visit.    REVIEW OF SYSTEMS:   A 10+ POINT REVIEW OF SYSTEMS WAS OBTAINED including  neurology, dermatology, psychiatry, cardiac, respiratory, lymph, extremities, GI, GU, Musculoskeletal, constitutional, breasts, reproductive, HEENT.  All pertinent positives are noted in the HPI.  All others are negative.   PHYSICAL EXAMINATION: ECOG PERFORMANCE STATUS: 1 - Symptomatic but completely ambulatory  . Vitals:   04/28/20 1231  BP: 128/90  Pulse: (!) 112  Resp: 18  Temp: 97.7 F (36.5 C)  SpO2: 100%   Filed Weights   04/28/20 1231  Weight: 119 lb 9.6 oz (54.3 kg)   .Body mass index is 23.36 kg/m.  GENERAL:alert, in no acute distress and comfortable SKIN: no acute rashes, no significant lesions EYES: conjunctiva are pink and non-injected, sclera anicteric OROPHARYNX: MMM, no exudates, no oropharyngeal erythema or ulceration NECK: supple, no JVD LYMPH:  no palpable lymphadenopathy in the cervical, axillary or inguinal regions LUNGS: clear to auscultation b/l with normal respiratory effort HEART: regular rate & rhythm ABDOMEN:  normoactive bowel sounds , non tender, not distended. Extremity: no pedal edema PSYCH: alert & oriented x 3 with fluent speech NEURO: no focal motor/sensory deficits  LABORATORY DATA:  I have reviewed the data as listed  . CBC Latest Ref Rng & Units 04/28/2020 04/22/2020 04/21/2020  WBC 4.0 - 10.5 K/uL 0.6(LL) 6.3 9.1  Hemoglobin 12.0 - 15.0 g/dL 9.9(L) 9.5(L) 9.6(L)  Hematocrit 36 - 46 % 31.4(L) 30.4(L) 30.8(L)  Platelets 150 - 400 K/uL 198 451(H) 461(H)    . CMP Latest Ref Rng & Units 04/28/2020 04/22/2020 04/21/2020  Glucose 70 - 99 mg/dL 137(H) 116(H) 114(H)  BUN 6 - 20 mg/dL _0 Creatinine 0.44 - 1.00 mg/dL 0.69 0.59 0.59  Sodium 135 - 145 mmol/L 138 136 136  Potassium 3.5 - 5.1 mmol/L 3.4(L) 3.5 3.4(L)  Chloride 98 - 111 mmol/L 105 98 98  CO2 22 - 32 mmol/L _1 Calcium 8.9 - 10.3 mg/dL 9.4 9.7 9.7  Total Protein 6.5 - 8.1 g/dL 6.3(L) 6.0(L) 6.3(L)  Total Bilirubin 0.3 - 1.2 mg/dL 0.4 0.7 0.5  Alkaline Phos 38 - 126  U/L 79 68 75  AST 15 - 41 U/L 11(L) 31 33  ALT 0 - 44 U/L _2 04/08/2020 Pancreatic Mass Surgical Pathology Report [id number]     RADIOGRAPHIC STUDIES: I have personally reviewed the radiological images as listed and agreed with the findings in the report. DG Chest 1 View  Result Date: 04/08/2020 CLINICAL DATA:  Post thoracentesis EXAM: CHEST  1 VIEW COMPARISON:  CT 04/07/2020 FINDINGS: Moderate to large left pleural effusion remains following thoracentesis. No pneumothorax. Right lung clear. Heart is normal size. No effusions. IMPRESSION: Moderate to large left effusion with left base atelectasis. No pneumothorax. Electronically Signed   By: Rolm Baptise M.D.   On: 04/08/2020 16:55   DG Chest 2 View  Result Date: 04/14/2020 CLINICAL DATA:  Shortness of breath EXAM: CHEST - 2 VIEW COMPARISON:  04/08/2020 FINDINGS: Moderate to large left pleural effusion, slightly decreased since prior study. Left base atelectasis or infiltrate. Right lung clear. Heart is normal size. IMPRESSION: Moderate to large left effusion with left base atelectasis or infiltrate. Effusion slightly decreased since prior study. Electronically Signed   By: Rolm Baptise M.D.   On: 04/14/2020 14:39   DG Abd 1 View  Result Date: 04/14/2020 CLINICAL DATA:  Shortness of breath, abdominal swelling EXAM: ABDOMEN - 1 VIEW COMPARISON:  CT 04/06/2020 FINDINGS: Nonobstructive bowel gas pattern. No free air or suspicious calcification. Moderate stool burden throughout the colon. Prominent soft tissue density seen in the left upper abdomen corresponding to the previously seen large left upper quadrant mass involving the pancreas and spleen on prior CT. IMPRESSION: No evidence of bowel obstruction or free air. Prominent soft tissue density in the left upper quadrant corresponding to the large mass seen involving much of the pancreas and spleen on prior CT. Electronically Signed   By:  Rolm Baptise M.D.   On: 04/14/2020 14:40    CT CHEST W CONTRAST  Result Date: 04/07/2020 CLINICAL DATA:  44 year old female with cancer of unknown primary. Pancreatic mass noted on the CT of the abdomen pelvis dated 04/06/2020. EXAM: CT CHEST WITH CONTRAST TECHNIQUE: Multidetector CT imaging of the chest was performed during intravenous contrast administration. CONTRAST:  77m OMNIPAQUE IOHEXOL 300 MG/ML  SOLN COMPARISON:  Chest CT dated 11/29/2017. FINDINGS: Cardiovascular: There is no cardiomegaly or pericardial effusion. The thoracic aorta is unremarkable. The origins of the great vessels of the aortic arch appear patent as visualized. Evaluation of the pulmonary arteries is very limited due to suboptimal opacification and timing of the contrast. No definite large central pulmonary artery embolus identified. V/Q scan may provide better evaluation if there is high clinical concern for acute PE. Mediastinum/Nodes: No hilar or mediastinal adenopathy. The esophagus and the thyroid gland are grossly unremarkable. No mediastinal fluid collection. Lungs/Pleura: Large left pleural effusion with near complete compressive atelectasis of the left lower lobe and partial right upper lobe. Pneumonia is not excluded. Clinical correlation is recommended. The right lung is clear. There is no pneumothorax. There is slight shift of the mediastinum to the right of the midline. The central airways are patent. Upper Abdomen: Large heterogeneous upper abdominal mass with invasion of the spleen. Musculoskeletal: No acute osseous pathology. IMPRESSION: 1. Large left pleural effusion with near complete compressive atelectasis of the left lower lobe and partial right upper lobe. Pneumonia is not excluded. Clinical correlation is recommended. Consider thoracentesis for symptomatic relief. 2. Large heterogeneous upper abdominal mass with invasion of the spleen. Electronically Signed   By: AAnner CreteM.D.   On: 04/07/2020 23:31   CT ABDOMEN PELVIS W CONTRAST  Result  Date: 04/06/2020 CLINICAL DATA:  Periodically epigastric pain EXAM: CT ABDOMEN AND PELVIS WITH CONTRAST TECHNIQUE: Multidetector CT imaging of the abdomen and pelvis was performed using the standard protocol following bolus administration of intravenous contrast. CONTRAST:  1053mOMNIPAQUE IOHEXOL 300 MG/ML  SOLN COMPARISON:  None. FINDINGS: Lower chest: The visualized heart size within normal limits. No pericardial fluid/thickening. No hiatal hernia. There is a small to moderate left pleural effusion with basilar atelectasis. Hepatobiliary: The liver is normal in density without focal abnormality.The main portal vein is patent. No evidence of calcified gallstones, gallbladder wall thickening or biliary dilatation. Pancreas: There is a extensive heterogeneously enhancing mass involving nearly the entirety of the pancreatic body and tail. Only a small portion of the pancreatic head appears to be spared. The SMV and SMA appear to be patent. No areas of internal necrosis seen within the mass. The mass appears to extend into the splenic hilum. Spleen: Extensive large multiple heterogeneously enhancing hypodense masses seen throughout the splenic parenchyma likely from the extension of the pancreatic mass which extends through the splenic hilum. There is diffuse splenomegaly. The splenic vein appears to be narrowed wall entering the splenic hilum. Adrenals/Urinary Tract: Both adrenal glands appear normal. The kidneys and collecting system appear normal without evidence of urinary tract calculus or hydronephrosis. Bladder is unremarkable. Stomach/Bowel: There appears to be diffuse wall thickening seen within the proximal stomach with with the heterogeneous mass seen encompassing the proximal portion. The remainder of the small-bowel and colon are unremarkable. There is a moderate amount of colonic stool present. Vascular/Lymphatic: There is a left periaortic nodule/lymph node causing mild displacement of the third portion  of the duodenum measuring 2.8 x 2.7 cm. Scattered small left-sided periaortic lymph nodes  are also noted. Reproductive: Extensively enlarged uterus with multiple hypodense fibroids are seen throughout as on a prior MRI dating back to 2017. Small amount of fluid within the endometrial canal. Other: Small amount of abdominopelvic ascites is seen. Musculoskeletal: No acute or significant osseous findings. 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 Retroperitoneal adenopathy, consistent with metastatic disease Diffuse wall thickening seen within the proximal stomach which could either be related to gastritis or possible metastatic disease. Small amount of abdominopelvic ascites Enlarged uterus with multiple uterine fibroids These results were called by telephone at the time of interpretation on 04/06/2020 at 5:19 am to provider [facility] , who verbally acknowledged these results. Electronically Signed   By: Prudencio Pair M.D.   On: 04/06/2020 05:19   NM PET Image Initial (PI) Skull Base To Thigh  Result Date: 04/15/2020 CLINICAL DATA:  Initial treatment strategy for diffuse large B-cell lymphoma. EXAM: NUCLEAR MEDICINE PET SKULL BASE TO THIGH TECHNIQUE: 6.4 mCi F-18 FDG was injected intravenously. Full-ring PET imaging was performed from the skull base to thigh after the radiotracer. CT data was obtained and used for attenuation correction and anatomic localization. Fasting blood glucose: 101 mg/dl COMPARISON:  CT scan 04/06/2020 FINDINGS: Mediastinal blood pool activity: SUV max 1.84 Liver activity: SUV max 2.61 NECK: No hypermetabolic lymph nodes in the neck. Incidental CT findings: none CHEST: No hypermetabolic mediastinal or hilar nodes. No suspicious pulmonary nodules on the CT scan. No  hypermetabolic breast lesions, supraclavicular or axillary adenopathy. Incidental CT findings: Persistent left pleural effusion and overlying left lower lobe atelectasis. ABDOMEN/PELVIS: Large left upper quadrant abdominal mass involving the spleen, stomach and retroperitoneum is markedly hypermetabolic with SUV max of 25.95. Associated contralateral retroperitoneal lymphadenopathy is hypermetabolic with SUV max of 63.87. No pelvic lymphadenopathy. No inguinal lymphadenopathy. No worrisome hepatic lesions. Incidental CT findings: Markedly enlarged fibroid uterus is noted. Simple appearing cyst associated with the left ovary. Subcutaneous lesions involving the anterior abdominal wall. One is near the umbilicus. The other is overlying the mid pelvis. These have the appearance of some type of skin blister. Recommend clinical correlation. Mild hypermetabolism could suggest an inflammatory process. Given patient's age IV status Kaposi's sarcoma would be another possibility. SKELETON: No findings suspicious for osseous metastatic disease. Incidental CT findings: none IMPRESSION: 1. Large left upper quadrant mass is markedly hypermetabolic and consistent with patient's known lymphoma. 2. No findings to suggest involvement of the neck, chest or pelvis or bony structures. 3. Enlarged fibroid uterus. 4. Midline anterior abdominal skin lesions. Mild hypermetabolism. These could reflect some type of skin blisters. I suppose Kaposi's sarcoma would be another consideration given the patient's HIV status. Electronically Signed   By: Marijo Sanes M.D.   On: 04/15/2020 14:20   CT BIOPSY  Result Date: 04/08/2020 INDICATION: 44 year old female with pancreatic mass versus peripancreatic adenopathy and associated involvement of the spleen. Findings are concerning for possible lymphoma. Patient presents for CT-guided core biopsy to obtain tissue diagnosis. EXAM: CT-guided core biopsy MEDICATIONS: None. ANESTHESIA/SEDATION: Moderate  (conscious) sedation was employed during this procedure. A total of Versed 2 mg and Fentanyl 100 mcg was administered intravenously. Moderate Sedation Time: 12 minutes. The patient's level of consciousness and vital signs were monitored continuously by radiology nursing throughout the procedure under my direct supervision. FLUOROSCOPY TIME:  None. COMPLICATIONS: None immediate. PROCEDURE: Informed written  consent was obtained from the patient after a thorough discussion of the procedural risks, benefits and alternatives. All questions were addressed. A timeout was performed prior to the initiation of the procedure. A planning axial CT scan was performed. The mass was identified. A suitable skin entry site was selected and marked. Local anesthesia was attained by infiltration with 1% lidocaine. A small dermatotomy was made. Under intermittent CT guidance, a 17 gauge introducer needle was carefully advanced into the margin of the mass. Multiple 18 gauge core biopsies were then obtained coaxially using the bio Pince automated biopsy device. Biopsy specimens were placed in saline and delivered to pathology for further analysis. Gel-Foam was injected through the introducer needle as it was removed. Post biopsy CT imaging demonstrates no evidence of hematoma or other immediate complication. The patient tolerated the procedure well. IMPRESSION: Technically successful CT-guided biopsy of pancreatic mass versus peripancreatic lymphadenopathy. Electronically Signed   By: Jacqulynn Cadet M.D.   On: 04/08/2020 16:32   ECHOCARDIOGRAM COMPLETE  Result Date: 04/18/2020    ECHOCARDIOGRAM REPORT   Patient Name:   MISSI MCMACKIN Date of Exam: 04/18/2020 Medical Rec #:  [id number]       Height:       60.0 in Accession #:    [id number]      Weight:       127.0 lb Date of Birth:  [birth year]        BSA:          1.539 m Patient Age:    3 years        BP:           142/98 mmHg Patient Gender: F               HR:           91 bpm.  Exam Location:  Inpatient Procedure: 2D Echo, Cardiac Doppler, Color Doppler and Strain Analysis STAT ECHO Indications:    Chemotherapy evaluation v87.41 / v58.11  History:        Patient has no prior history of Echocardiogram examinations.                 Risk Factors:Non-Smoker. Pleural effusion.  Sonographer:    Vickie Epley RDCS Referring Phys: [facility]  1. Left ventricular ejection fraction, by estimation, is 60 to 65%. The left ventricle has normal function. The left ventricle has no regional wall motion abnormalities. Left ventricular diastolic parameters were normal. The average left ventricular global longitudinal strain is -22.3 %.  2. Right ventricular systolic function is normal. The right ventricular size is normal. Tricuspid regurgitation signal is inadequate for assessing PA pressure.  3. The mitral valve is normal in structure. Trivial mitral valve regurgitation.  4. The aortic valve is tricuspid. Aortic valve regurgitation is not visualized. No aortic stenosis is present.  5. The inferior vena cava is normal in size with greater than 50% respiratory variability, suggesting right atrial pressure of 3 mmHg. FINDINGS  Left Ventricle: Left ventricular ejection fraction, by estimation, is 60 to 65%. The left ventricle has normal function. The left ventricle has no regional wall motion abnormalities. The average left ventricular global longitudinal strain is -22.3 %. The left ventricular internal cavity size was normal in size. There is no left ventricular hypertrophy. Left ventricular diastolic parameters were normal. Right Ventricle: The right ventricular size is normal. Right vetricular wall thickness was not assessed. Right ventricular systolic function is normal. Tricuspid regurgitation signal is inadequate for  assessing PA pressure. Left Atrium: Left atrial size was normal in size. Right Atrium: Right atrial size was normal in size. Pericardium: Trivial pericardial effusion  is present. Mitral Valve: The mitral valve is normal in structure. Trivial mitral valve regurgitation. Tricuspid Valve: The tricuspid valve is normal in structure. Tricuspid valve regurgitation is not demonstrated. Aortic Valve: The aortic valve is tricuspid. Aortic valve regurgitation is not visualized. No aortic stenosis is present. Pulmonic Valve: The pulmonic valve was not well visualized. Pulmonic valve regurgitation is not visualized. Aorta: The aortic root is normal in size and structure. Venous: The inferior vena cava is normal in size with greater than 50% respiratory variability, suggesting right atrial pressure of 3 mmHg. IAS/Shunts: The interatrial septum was not well visualized. Additional Comments: There is pleural effusion in the left lateral region.  LEFT VENTRICLE PLAX 2D LVIDd:         4.00 cm     Diastology LVIDs:         2.80 cm     LV e' lateral:   12.40 cm/s LV PW:         0.70 cm     LV E/e' lateral: 7.3 LV IVS:        0.70 cm     LV e' medial:    8.70 cm/s LVOT diam:     1.60 cm     LV E/e' medial:  10.4 LV SV:         36 LV SV Index:   23          2D Longitudinal Strain LVOT Area:     2.01 cm    2D Strain GLS Avg:     -22.3 %  LV Volumes (MOD) LV vol d, MOD A2C: 83.6 ml LV vol d, MOD A4C: 74.0 ml LV vol s, MOD A2C: 33.0 ml LV vol s, MOD A4C: 37.0 ml LV SV MOD A2C:     50.6 ml LV SV MOD A4C:     74.0 ml LV SV MOD BP:      43.6 ml RIGHT VENTRICLE RV S prime:     13.80 cm/s TAPSE (M-mode): 2.2 cm LEFT ATRIUM             Index       RIGHT ATRIUM          Index LA diam:        4.00 cm 2.60 cm/m  RA Area:     7.07 cm LA Vol (A2C):   17.7 ml 11.50 ml/m RA Volume:   9.87 ml  6.41 ml/m LA Vol (A4C):   26.1 ml 16.96 ml/m LA Biplane Vol: 23.4 ml 15.20 ml/m  AORTIC VALVE LVOT Vmax:   94.60 cm/s LVOT Vmean:  59.100 cm/s LVOT VTI:    0.177 m  AORTA Ao Root diam: 2.90 cm MITRAL VALVE MV Area (PHT): 4.80 cm    SHUNTS MV Decel Time: 158 msec    Systemic VTI:  0.18 m MV E velocity: 90.80 cm/s   Systemic Diam: 1.60 cm MV A velocity: 85.90 cm/s MV E/A ratio:  1.06 Oswaldo Milian MD Electronically signed by Oswaldo Milian MD Signature Date/Time: 04/18/2020/12:07:09 PM    Final    IR IMAGING GUIDED PORT INSERTION  Result Date: 04/15/2020 INDICATION: History of diffuse large B-cell lymphoma. In need of durable intravenous access for chemotherapy administration. EXAM: IMPLANTED PORT A CATH PLACEMENT WITH ULTRASOUND AND FLUOROSCOPIC GUIDANCE COMPARISON:  Chest CT-04/07/2020 MEDICATIONS: Ancef 2 gm IV; The antibiotic  was administered within an appropriate time interval prior to skin puncture. ANESTHESIA/SEDATION: Moderate (conscious) sedation was employed during this procedure. A total of Versed 4 mg and Fentanyl 100 mcg was administered intravenously. Moderate Sedation Time: 26 minutes. The patient's level of consciousness and vital signs were monitored continuously by radiology nursing throughout the procedure under my direct supervision. CONTRAST:  None FLUOROSCOPY TIME:  30 seconds (5 mGy) COMPLICATIONS: None immediate. PROCEDURE: The procedure, risks, benefits, and alternatives were explained to the patient. Questions regarding the procedure were encouraged and answered. The patient understands and consents to the procedure. The right neck and chest were prepped with chlorhexidine in a sterile fashion, and a sterile drape was applied covering the operative field. Maximum barrier sterile technique with sterile gowns and gloves were used for the procedure. A timeout was performed prior to the initiation of the procedure. Local anesthesia was provided with 1% lidocaine with epinephrine. After creating a small venotomy incision, a micropuncture kit was utilized to access the internal jugular vein. Real-time ultrasound guidance was utilized for vascular access including the acquisition of a permanent ultrasound image documenting patency of the accessed vessel. The microwire was utilized to measure  appropriate catheter length. A subcutaneous port pocket was then created along the upper chest wall utilizing a combination of sharp and blunt dissection. The pocket was irrigated with sterile saline. A single lumen "ISP" sized power injectable port was chosen for placement. The 8 Fr catheter was tunneled from the port pocket site to the venotomy incision. The port was placed in the pocket. The external catheter was trimmed to appropriate length. At the venotomy, an 8 Fr peel-away sheath was placed over a guidewire under fluoroscopic guidance. The catheter was then placed through the sheath and the sheath was removed. Final catheter positioning was confirmed and documented with a fluoroscopic spot radiograph. The port was accessed with a Huber needle, aspirated and flushed with heparinized saline. The venotomy site was closed with an interrupted 4-0 Vicryl suture. The port pocket incision was closed with interrupted 2-0 Vicryl suture. The skin was opposed with a running subcuticular 4-0 Vicryl suture. Dermabond and Steri-strips were applied to both incisions. Dressings were applied. The patient tolerated the procedure well without immediate post procedural complication. FINDINGS: After catheter placement, the tip lies within the superior cavoatrial junction. The catheter aspirates and flushes normally and is ready for immediate use. IMPRESSION: Successful placement of a right internal jugular approach power injectable Port-A-Cath. The catheter is ready for immediate use. Electronically Signed   By: Sandi Mariscal M.D.   On: 04/15/2020 15:28   IR THORACENTESIS ASP PLEURAL SPACE W/IMG GUIDE  Result Date: 04/15/2020 INDICATION: Recent diagnosis diffuse large B-cell lymphoma, now with recurrent left-sided pleural effusion. Please perform ultrasound-guided thoracentesis for therapeutic purposes prior to PET-CT. EXAM: IR THORACENTESIS ASP PLEURAL SPACE W/IMG GUIDE COMPARISON:  Chest CT-04/07/2020 MEDICATIONS: None.  COMPLICATIONS: None immediate. TECHNIQUE: Informed written consent was obtained from the patient after a discussion of the risks, benefits and alternatives to treatment. A timeout was performed prior to the initiation of the procedure. Initial ultrasound scanning demonstrates a small left-sided anechoic pleural effusion. The lower chest was prepped and draped in the usual sterile fashion. 1% lidocaine was used for local anesthesia. An ultrasound image was saved for documentation purposes. An 8 Fr Safe-T-Centesis catheter was introduced. The thoracentesis was performed. The catheter was removed and a dressing was applied. The patient tolerated the procedure well without immediate post procedural complication. The patient was escorted to  have an upright chest radiograph. FINDINGS: A total of approximately 200 cc of orange colored pleural fluid was removed. IMPRESSION: Successful ultrasound-guided left sided thoracentesis yielding 200 cc orange colored pleural fluid. Electronically Signed   By: Sandi Mariscal M.D.   On: 04/15/2020 16:18   US THORACENTESIS ASP PLEURAL SPACE W/IMG GUIDE  Result Date: 04/08/2020 INDICATION: Patient with history of HIV, pancreatic mass, retroperitoneal adenopathy, ascites, splenic masses, left pleural effusion. Request received for diagnostic and therapeutic left thoracentesis. EXAM: ULTRASOUND GUIDED DIAGNOSTIC AND THERAPEUTIC LEFT THORACENTESIS MEDICATIONS: None COMPLICATIONS: None immediate. PROCEDURE: An ultrasound guided thoracentesis was thoroughly discussed with the patient and questions answered. The benefits, risks, alternatives and complications were also discussed. The patient understands and wishes to proceed with the procedure. Written consent was obtained. Ultrasound was performed to localize and mark an adequate pocket of fluid in the left chest. The area was then prepped and draped in the normal sterile fashion. 1% Lidocaine was used for local anesthesia. Under ultrasound  guidance a 6 Fr Safe-T-Centesis catheter was introduced. Thoracentesis was performed. The catheter was removed and a dressing applied. FINDINGS: A total of approximately 1.5 liters of blood-tinged fluid was removed. Samples were sent to the laboratory as requested by the clinical team. Due to patient chest discomfort and coughing only the above amount of fluid was removed today. IMPRESSION: Successful ultrasound guided diagnostic and therapeutic left thoracentesis yielding 1.5 liters of pleural fluid. Read by: Rowe Robert, PA-C Electronically Signed   By: Jacqulynn Cadet M.D.   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 Hemoglobin C], childhood malaria. Patient notes that she had a CT of the abdomen sometime in 2020 that showed no acute pathology.This was not [facility]. Not accessible to [place] at this point. She did have an MRI of the abdomen in July 2019 which showed indeterminate splenic lesions.No other acute abdominal pathology.No concern with hepatocellular carcinoma.  Patient is presenting now with  #1Pancreatic mass along with retroperitoneal lymphadenopathy and diffuse splenic lesions. No internal necrosis within the mass noted. Diffuse splenomegaly. Left periaortic lymph node causing mild displacement of the third portion of the duodenum.  Significantly elevated LDH levels. CA 19-9 and CEA levels unrevealing  # 2 Left sided large pleural effusion and lung atelectasis. S/p diagnostic and therapeutic thoracentesis -- lymphocytic predominance @ 80%  Overall picture concerning for possible  High grade B cell lymphoma vs primary pancreatic malignancy (though CA 19-9 levels indeterminate and  not significantly elevated.  HIV could be risk factors for high grade EBV driven lymphomas  Patient symptomatology has developed rather quickly over the last few weeks.  #3 microcytic anemia-chronic.Some element of iron deficiency versus hemoglobinopathy versus anemia of chronic disease related to malignancy. Patient has received 1 dose of IV Feraheme Will rpt 2nd dose in 1 week.  #4 history of HIV/AIDS follows with Dr. Melynda Keller and at [facility].   PLAN: -Discussed pt labwork today, 04/28/20; of CBC w/diff and CMP is as follows: all values are WNL except for WBC at 0.6K, Hemoglobin at 9.9, HCT at 31.4, MCV at 69.9, MCH at 22, RDW at 20, Neutro Abs at 0.3K, Lymph Abs at 0.2K, Potassium at 3.4, Glucose at 137, Total Protein at 6.3, Albumin at  3.3, AST at 11 -Discussed 04/28/20 of LDH at 443 -Discussed 04/28/20 of Uric Acid at 3.9: WNL -Discussed 04/18/20 of Echocardiogram ([id number] pt that we would complete up to 6 cycles and repeat scans after the third cycle  --Advised on tingling in fingertips- keep warm, usually goes away  -If any fever or signs of infection- need to immediately come to ED -Will get rid of allopurinol with next cycle  -Recommends eating well- no restrictions  -Recommends staying active -Recommends staying hydrated  -Advised on watching constipation  -Advised Udenyca will boost WBC -Will get labs next week -Advised avoiding uncooked foods -Recommend taking B-complex supplement  -Continue salt and baking soda mouth wash  -Will get labs next week -Will see back next week  FOLLOW UP: Plz schedule for portflush, labs and MD visit in 1 week   The total time spent in the appt was 30 minutes and more than 50% was on counseling and direct patient cares.  All of the patient's questions were answered with apparent satisfaction. The patient knows to call the clinic with any problems, questions or concerns.   Sullivan Lone MD  MS AAHIVMS [facility] Hematology/Oncology Physician [facility]  (Office):       [phone] (Work cell):  [phone] (Fax):           [phone]  04/28/2020 2:22 PM  I, Dawayne Cirri am acting as a Education administrator for Dr. Sullivan Lone.   .I have reviewed the above documentation for accuracy and completeness, and I agree with the above. Brunetta Genera MD

## 2020-04-28 ENCOUNTER — Inpatient Hospital Stay: Payer: 59 | Admitting: Hematology

## 2020-04-28 ENCOUNTER — Other Ambulatory Visit: Payer: Self-pay

## 2020-04-28 ENCOUNTER — Inpatient Hospital Stay: Payer: 59

## 2020-04-28 VITALS — BP 128/90 | HR 112 | Temp 97.7°F | Resp 18 | Ht 60.0 in | Wt 119.6 lb

## 2020-04-28 DIAGNOSIS — D702 Other drug-induced agranulocytosis: Secondary | ICD-10-CM

## 2020-04-28 DIAGNOSIS — C8338 Diffuse large B-cell lymphoma, lymph nodes of multiple sites: Secondary | ICD-10-CM | POA: Diagnosis not present

## 2020-04-28 DIAGNOSIS — Z5112 Encounter for antineoplastic immunotherapy: Secondary | ICD-10-CM | POA: Diagnosis not present

## 2020-04-28 DIAGNOSIS — J90 Pleural effusion, not elsewhere classified: Secondary | ICD-10-CM | POA: Diagnosis not present

## 2020-04-28 DIAGNOSIS — Z95828 Presence of other vascular implants and grafts: Secondary | ICD-10-CM

## 2020-04-28 LAB — CBC WITH DIFFERENTIAL (CANCER CENTER ONLY)
Abs Immature Granulocytes: 0 10*3/uL (ref 0.00–0.07)
Basophils Absolute: 0 10*3/uL (ref 0.0–0.1)
Basophils Relative: 2 %
Eosinophils Absolute: 0 10*3/uL (ref 0.0–0.5)
Eosinophils Relative: 7 %
HCT: 31.4 % — ABNORMAL LOW (ref 36.0–46.0)
Hemoglobin: 9.9 g/dL — ABNORMAL LOW (ref 12.0–15.0)
Immature Granulocytes: 0 %
Lymphocytes Relative: 33 %
Lymphs Abs: 0.2 10*3/uL — ABNORMAL LOW (ref 0.7–4.0)
MCH: 22 pg — ABNORMAL LOW (ref 26.0–34.0)
MCHC: 31.5 g/dL (ref 30.0–36.0)
MCV: 69.6 fL — ABNORMAL LOW (ref 80.0–100.0)
Monocytes Absolute: 0.1 10*3/uL (ref 0.1–1.0)
Monocytes Relative: 10 %
Neutro Abs: 0.3 10*3/uL — CL (ref 1.7–7.7)
Neutrophils Relative %: 48 %
Platelet Count: 198 10*3/uL (ref 150–400)
RBC: 4.51 MIL/uL (ref 3.87–5.11)
RDW: 20 % — ABNORMAL HIGH (ref 11.5–15.5)
WBC Count: 0.6 10*3/uL — CL (ref 4.0–10.5)
nRBC: 0 % (ref 0.0–0.2)

## 2020-04-28 LAB — CMP (CANCER CENTER ONLY)
ALT: 20 U/L (ref 0–44)
AST: 11 U/L — ABNORMAL LOW (ref 15–41)
Albumin: 3.3 g/dL — ABNORMAL LOW (ref 3.5–5.0)
Alkaline Phosphatase: 79 U/L (ref 38–126)
Anion gap: 10 (ref 5–15)
BUN: 10 mg/dL (ref 6–20)
CO2: 23 mmol/L (ref 22–32)
Calcium: 9.4 mg/dL (ref 8.9–10.3)
Chloride: 105 mmol/L (ref 98–111)
Creatinine: 0.69 mg/dL (ref 0.44–1.00)
GFR, Est AFR Am: >60 mL/min (ref >60)
GFR, Estimated: >60 mL/min (ref >60)
Glucose, Bld: 137 mg/dL — ABNORMAL HIGH (ref 70–99)
Potassium: 3.4 mmol/L — ABNORMAL LOW (ref 3.5–5.1)
Sodium: 138 mmol/L (ref 135–145)
Total Bilirubin: 0.4 mg/dL (ref 0.3–1.2)
Total Protein: 6.3 g/dL — ABNORMAL LOW (ref 6.5–8.1)

## 2020-04-28 LAB — LACTATE DEHYDROGENASE: LDH: 443 U/L — ABNORMAL HIGH (ref 98–192)

## 2020-04-28 LAB — URIC ACID: Uric Acid, Serum: 3.9 mg/dL (ref 2.5–7.1)

## 2020-04-28 MED ORDER — HEPARIN SOD (PORK) LOCK FLUSH 100 UNIT/ML IV SOLN
500.0000 [IU] | Freq: Once | INTRAVENOUS | Status: AC
  Administered 2020-04-28: 500 [IU]
  Filled 2020-04-28: qty 5

## 2020-04-28 MED ORDER — NYSTATIN 100000 UNIT/ML MT SUSP: 5.0000 mL | Freq: Four times a day (QID) | OROMUCOSAL | 0 refills | Status: AC

## 2020-04-28 MED ORDER — SODIUM CHLORIDE 0.9% FLUSH
10.0000 mL | Freq: Once | INTRAVENOUS | Status: AC
  Administered 2020-04-28: 10 mL
  Filled 2020-04-28: qty 10

## 2020-04-28 NOTE — Progress Notes (Signed)
CRITICAL VALUE STICKER  CRITICAL VALUE: ANC 0.3  RECEIVER (on-site recipient of call): Azrielle Springsteen-O, [place] NOTIFIED: 04/28/20 @ 1308  MESSENGER (representative from lab): Hassan Rowan, RN  MD NOTIFIED: Sullivan Lone  TIME OF NOTIFICATION: 1309  RESPONSE: MD will address with pt during visit

## 2020-04-28 NOTE — Progress Notes (Signed)
CRITICAL VALUE STICKER  CRITICAL VALUE: WBC 0.6 RECEIVER (on-site recipient of call): Medea Deines-O, RN  DATE & TIME NOTIFIED: 04/28/20 @ 1242  MESSENGER (representative from lab): Hassan Rowan, RN  MD NOTIFIED: Sullivan Lone  TIME OF NOTIFICATION: 1243  RESPONSE: MD will address during visit with pt.

## 2020-05-03 ENCOUNTER — Telehealth: Payer: Self-pay | Admitting: Hematology

## 2020-05-03 NOTE — Telephone Encounter (Signed)
Scheduled per 06/24 los, patient has been called and voicemail was left. 

## 2020-05-04 ENCOUNTER — Other Ambulatory Visit: Payer: Self-pay | Admitting: Hematology

## 2020-05-04 ENCOUNTER — Encounter: Payer: Self-pay | Admitting: Hematology

## 2020-05-05 ENCOUNTER — Telehealth: Payer: Self-pay | Admitting: *Deleted

## 2020-05-05 NOTE — Progress Notes (Signed)
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 female with a past medical history significant for hepatitis B, HIV, chronic anemia who presented to [place] with abdominal pain.  She had a 2-week history of early satiety, poor appetite and, 5 pound weight loss.  Abdominal pain is typically postprandial and mainly at night after dinner while laying in bed.  Has been associated with nausea improves after vomiting.  Labs on admission showed a hemoglobin of 9.3, MCV 67.4, platelet count 554,000.  Lipase was mildly elevated at 123.  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  Husband was at the bedside at the time of the visit.  The patient reports that she is having ongoing abdominal discomfort today.  Pain is primarily in her epigastric area and radiates to the left side of her abdomen.  States pain is worse when laying down after she eats.  She states that she has had this pain for at least 2 weeks.  Her abdominal pain is worse after eating.  She has had some nausea and intermittent vomiting.  Vomiting makes her pain better.  She was attributing her symptoms to some the medications that she was taking for fertility treatments.  She reports a poor appetite and about a 5 pound weight loss.  She is not  having any fevers or chills.  Denies night sweats.  She has not noticed any palpable lymphadenopathy.  She denies headaches, dizziness, chest pain, shortness of breath, diarrhea.  She reports intermittent constipation.  Denies bleeding or lower extremity edema.  The patient is married and has no children.  She currently works as an Therapist, sports.  Denies alcohol tobacco use.  Family history significant for paternal grandmother with uterine cancer.  Medical oncology was asked see the patient to make recommendations regarding her abnormal CT scan findings.  INTERVAL HISTORY:  Heather [name] is a 44 year old female who is here for evaluation and management of Pancreatic mass with splenic metastases. We are joined by Mr. Holcomb. The patient's last visit with [place] was on 04/28/20. The pt reports that she is doing well overall.  The pt reports she is good. She has more energy. Her appetite is better. She has gained weight since last visit. Her breathing has been fine, she felt a little pain this morning. The pain she had after her Udenyca shot was bad.   Lab results today (05/06/20) of CBC w/diff and CMP is as follows: all values are WNL except for WBC at 14.8K, Hemoglobin at 10.7, HCT at 33.9, MCV at 74.3, MCH at 23.5, RDW at 26.5, nRBC at 0.7, Neuro Abs at 10.3K, Monocytes Absolute at 1.2K, Abs Immature Granulocytes at 2.49, Glucose at 120, AST at 14, Alkaline Phosphatase at 137 05/06/20 of Uric Acid at 4.0: WNL 05/06/20 of Magnesium at 1.7: WNL  On review of systems, pt reports healthy appetite, and denies fatigue, nausea, vomiting, diarrhea, chills, mouth  soars, abdominal pain, skin rashes and any other symptoms.   MEDICAL HISTORY:  Past Medical History:  Diagnosis Date  . Anemia   . Diffuse large B-cell lymphoma of lymph nodes of multiple regions ([place]) 04/12/2020  . 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 History of blood transfusion    childhood  . HIV (human immunodeficiency virus infection) ([place])     . Immune deficiency disorder [facility])     SURGICAL HISTORY: Past Surgical History:  Procedure Laterality Date  . CHROMOPERTUBATION Bilateral 11/29/2016   Procedure: CHROMOPERTUBATION;  Surgeon: Waymon Amato, MD;  Location: [facility];  Service: Gynecology;  Laterality: Bilateral;  fallopian tubes  . ENDOMETRIAL BIOPSY    . IR IMAGING GUIDED PORT INSERTION  04/15/2020  . IR THORACENTESIS ASP PLEURAL SPACE W/IMG GUIDE  04/15/2020  . MYOMECTOMY N/A 11/29/2016   Procedure: MYOMECTOMY;  Surgeon: Waymon Amato, MD;  Location: [facility];  Service: Gynecology;  Laterality: N/A;    SOCIAL HISTORY: Social History   Socioeconomic History  . Marital status: Married    Spouse name: Not on file  . Number of children: Not on file  . Years of education: Not on file  . Highest education level: Not on file  Occupational History  . Not on file  Tobacco Use  . Smoking status: Never Smoker  . Smokeless tobacco: Never Used  Substance and Sexual Activity  . Alcohol use: Yes    Comment: occ  . Drug use: No  . Sexual activity: Yes  Other Topics Concern  . Not on file  Social History Narrative  . Not on file   Social Determinants of Health   Financial Resource Strain:   . Difficulty of Paying Living Expenses:   Food Insecurity:   . Worried About Charity fundraiser in the Last Year:   . Arboriculturist in the Last Year:   Transportation Needs:   . Film/video editor (Medical):   Marland Kitchen Lack of Transportation (Non-Medical):   Physical Activity:   . Days of Exercise per Week:   . Minutes of Exercise per Session:   Stress:   . Feeling of Stress :   Social Connections:   . Frequency of Communication with Friends and Family:   . Frequency of Social Gatherings with Friends and Family:   . Attends Religious Services:   . Active Member of Clubs or Organizations:   . Attends Archivist Meetings:   Marland Kitchen Marital Status:   Intimate Partner Violence:   . Fear of Current or Ex-Partner:   . Emotionally  Abused:   Marland Kitchen Physically Abused:   . Sexually Abused:     FAMILY HISTORY: Family History  Problem Relation Age of Onset  . Diabetes Mother   . Hypertension Mother     ALLERGIES:  has No Known Allergies.  MEDICATIONS:  Current Outpatient Medications  Medication Sig Dispense Refill  . acetaminophen (TYLENOL) 325 MG tablet Take 2 tablets (650 mg total) by mouth every 4 (four) hours as needed for mild pain.    Marland Kitchen allopurinol (ZYLOPRIM) 100 MG tablet Take 1 tablet (100 mg total) by mouth 2 (two) times daily. 60 tablet 1  . bictegravir-emtricitabine-tenofovir AF (BIKTARVY) 50-200-25 MG TABS tablet Take 1 tablet by mouth daily with breakfast.     . cholecalciferol (VITAMIN D3) 25 MCG (1000 UNIT) tablet Take 1,000 Units by mouth at bedtime.     Marland Kitchen dexamethasone (DECADRON) 4 MG tablet Take 1 tab twice  a day for 2 days following each cycle of chemo 30 tablet 0  . feeding supplement, ENSURE ENLIVE, (ENSURE ENLIVE) LIQD Take 237 mLs by mouth 2 (two) times daily between meals. 237 mL 12  . ibuprofen (ADVIL) 200 MG tablet Take 400 mg by mouth every 6 (six) hours as needed for mild pain.    Marland Kitchen lidocaine-prilocaine (EMLA) cream Apply to affected area once 30 g 3  . LORazepam (ATIVAN) 0.5 MG tablet Take 1 tablet (0.5 mg total) by mouth every 6 (six) hours as needed (for chemo induced nausea or vomiting). 30 tablet 0  . Multiple Vitamin (MULTIVITAMIN WITH MINERALS) TABS tablet Take 1 tablet by mouth at bedtime.     Marland Kitchen nystatin (MYCOSTATIN) 100000 UNIT/ML suspension Take 5 mLs (500,000 Units total) by mouth 4 (four) times daily for 10 days. Swish in the mouth/throat and swallow. 200 mL 0  . ondansetron (ZOFRAN) 8 MG tablet Take 1 tablet (8 mg total) by mouth every 8 (eight) hours as needed for nausea or vomiting. 20 tablet 0  . pantoprazole (PROTONIX) 40 MG tablet Take 1 tablet (40 mg total) by mouth daily for 14 days. 14 tablet 0  . polyethylene glycol (MIRALAX / GLYCOLAX) 17 g packet Take 17 g by mouth daily  as needed for mild constipation. 14 each 0  . potassium chloride SA (KLOR-CON) 20 MEQ tablet Take 1 tablet (20 mEq total) by mouth daily. 2 tablet 0  . prochlorperazine (COMPAZINE) 10 MG tablet Take 1 tablet (10 mg total) by mouth every 6 (six) hours as needed for nausea or vomiting. 30 tablet 1  . Sodium Chloride-Sodium Bicarb (SODIUM BICARBONATE/SODIUM CHLORIDE) SOLN 1 application by Mouth Rinse route 4 (four) times daily.     No current facility-administered medications for this visit.    REVIEW OF SYSTEMS:   A 10+ POINT REVIEW OF SYSTEMS WAS OBTAINED including neurology, dermatology, psychiatry, cardiac, respiratory, lymph, extremities, GI, GU, Musculoskeletal, constitutional, breasts, reproductive, HEENT.  All pertinent positives are noted in the HPI.  All others are negative.   PHYSICAL EXAMINATION: ECOG PERFORMANCE STATUS: 1 - Symptomatic but completely ambulatory  . Vitals:   05/06/20 1243  BP: 121/84  Pulse: 98  Resp: 17  Temp: 97.6 F (36.4 C)  SpO2: 100%   Filed Weights   05/06/20 1243  Weight: 122 lb 3.2 oz (55.4 kg)   .Body mass index is 23.87 kg/m.  GENERAL:alert, in no acute distress and comfortable SKIN: no acute rashes, no significant lesions EYES: conjunctiva are pink and non-injected, sclera anicteric OROPHARYNX: MMM, no exudates, no oropharyngeal erythema or ulceration NECK: supple, no JVD LYMPH:  no palpable lymphadenopathy in the cervical, axillary or inguinal regions LUNGS: clear to auscultation b/l with normal respiratory effort HEART: regular rate & rhythm ABDOMEN:  normoactive bowel sounds , non tender, not distended. Extremity: no pedal edema PSYCH: alert & oriented x 3 with fluent speech NEURO: no focal motor/sensory deficits  LABORATORY DATA:  I have reviewed the data as listed  . CBC Latest Ref Rng & Units 05/06/2020 04/28/2020 04/22/2020  WBC 4.0 - 10.5 K/uL 14.8(H) 0.6(LL) 6.3  Hemoglobin 12.0 - 15.0 g/dL 10.7(L) 9.9(L) 9.5(L)  Hematocrit  36 - 46 % 33.9(L) 31.4(L) 30.4(L)  Platelets 150 - 400 K/uL 365 198 451(H)    . CMP Latest Ref Rng & Units 05/06/2020 04/28/2020 04/22/2020  Glucose 70 - 99 mg/dL 120(H) 137(H) 116(H)  BUN 6 - 20 mg/dL 12 10 15   Creatinine 0.44 - 1.00 mg/dL 0.68  0.69 0.59  Sodium 135 - 145 mmol/L 138 138 136  Potassium 3.5 - 5.1 mmol/L 3.9 3.4(L) 3.5  Chloride 98 - 111 mmol/L 106 105 98  CO2 22 - 32 mmol/L 22 23 29   Calcium 8.9 - 10.3 mg/dL 10.1 9.4 9.7  Total Protein 6.5 - 8.1 g/dL 7.1 6.3(L) 6.0(L)  Total Bilirubin 0.3 - 1.2 mg/dL 0.3 0.4 0.7  Alkaline Phos 38 - 126 U/L 137(H) 79 68  AST 15 - 41 U/L 14(L) 11(L) 31  ALT 0 - 44 U/L 25 20 14    04/08/2020 Pancreatic Mass Surgical Pathology Report [id number]):     RADIOGRAPHIC STUDIES: I have personally reviewed the radiological images as listed and agreed with the findings in the report. DG Chest 1 View  Result Date: 04/08/2020 CLINICAL DATA:  Post thoracentesis EXAM: CHEST  1 VIEW COMPARISON:  CT 04/07/2020 FINDINGS: Moderate to large left pleural effusion remains following thoracentesis. No pneumothorax. Right lung clear. Heart is normal size. No effusions. IMPRESSION: Moderate to large left effusion with left base atelectasis. No pneumothorax. Electronically Signed   By: Rolm Baptise M.D.   On: 04/08/2020 16:55   DG Chest 2 View  Result Date: 04/14/2020 CLINICAL DATA:  Shortness of breath EXAM: CHEST - 2 VIEW COMPARISON:  04/08/2020 FINDINGS: Moderate to large left pleural effusion, slightly decreased since prior study. Left base atelectasis or infiltrate. Right lung clear. Heart is normal size. IMPRESSION: Moderate to large left effusion with left base atelectasis or infiltrate. Effusion slightly decreased since prior study. Electronically Signed   By: Rolm Baptise M.D.   On: 04/14/2020 14:39   DG Abd 1 View  Result Date: 04/14/2020 CLINICAL DATA:  Shortness of breath, abdominal swelling EXAM: ABDOMEN - 1 VIEW COMPARISON:  CT 04/06/2020  FINDINGS: Nonobstructive bowel gas pattern. No free air or suspicious calcification. Moderate stool burden throughout the colon. Prominent soft tissue density seen in the left upper abdomen corresponding to the previously seen large left upper quadrant mass involving the pancreas and spleen on prior CT. IMPRESSION: No evidence of bowel obstruction or free air. Prominent soft tissue density in the left upper quadrant corresponding to the large mass seen involving much of the pancreas and spleen on prior CT. Electronically Signed   By: Rolm Baptise M.D.   On: 04/14/2020 14:40   CT CHEST W CONTRAST  Result Date: 04/07/2020 CLINICAL DATA:  44 year old female with cancer of unknown primary. Pancreatic mass noted on the CT of the abdomen pelvis dated 04/06/2020. EXAM: CT CHEST WITH CONTRAST TECHNIQUE: Multidetector CT imaging of the chest was performed during intravenous contrast administration. CONTRAST:  47m OMNIPAQUE IOHEXOL 300 MG/ML  SOLN COMPARISON:  Chest CT dated 11/29/2017. FINDINGS: Cardiovascular: There is no cardiomegaly or pericardial effusion. The thoracic aorta is unremarkable. The origins of the great vessels of the aortic arch appear patent as visualized. Evaluation of the pulmonary arteries is very limited due to suboptimal opacification and timing of the contrast. No definite large central pulmonary artery embolus identified. V/Q scan may provide better evaluation if there is high clinical concern for acute PE. Mediastinum/Nodes: No hilar or mediastinal adenopathy. The esophagus and the thyroid gland are grossly unremarkable. No mediastinal fluid collection. Lungs/Pleura: Large left pleural effusion with near complete compressive atelectasis of the left lower lobe and partial right upper lobe. Pneumonia is not excluded. Clinical correlation is recommended. The right lung is clear. There is no pneumothorax. There is slight shift of the mediastinum to the right of the midline. The  central airways are  patent. Upper Abdomen: Large heterogeneous upper abdominal mass with invasion of the spleen. Musculoskeletal: No acute osseous pathology. IMPRESSION: 1. Large left pleural effusion with near complete compressive atelectasis of the left lower lobe and partial right upper lobe. Pneumonia is not excluded. Clinical correlation is recommended. Consider thoracentesis for symptomatic relief. 2. Large heterogeneous upper abdominal mass with invasion of the spleen. Electronically Signed   By: Anner Crete M.D.   On: 04/07/2020 23:31   NM PET Image Initial (PI) Skull Base To Thigh  Result Date: 04/15/2020 CLINICAL DATA:  Initial treatment strategy for diffuse large B-cell lymphoma. EXAM: NUCLEAR MEDICINE PET SKULL BASE TO THIGH TECHNIQUE: 6.4 mCi F-18 FDG was injected intravenously. Full-ring PET imaging was performed from the skull base to thigh after the radiotracer. CT data was obtained and used for attenuation correction and anatomic localization. Fasting blood glucose: 101 mg/dl COMPARISON:  CT scan 04/06/2020 FINDINGS: Mediastinal blood pool activity: SUV max 1.84 Liver activity: SUV max 2.61 NECK: No hypermetabolic lymph nodes in the neck. Incidental CT findings: none CHEST: No hypermetabolic mediastinal or hilar nodes. No suspicious pulmonary nodules on the CT scan. No hypermetabolic breast lesions, supraclavicular or axillary adenopathy. Incidental CT findings: Persistent left pleural effusion and overlying left lower lobe atelectasis. ABDOMEN/PELVIS: Large left upper quadrant abdominal mass involving the spleen, stomach and retroperitoneum is markedly hypermetabolic with SUV max of 64.33. Associated contralateral retroperitoneal lymphadenopathy is hypermetabolic with SUV max of 29.51. No pelvic lymphadenopathy. No inguinal lymphadenopathy. No worrisome hepatic lesions. Incidental CT findings: Markedly enlarged fibroid uterus is noted. Simple appearing cyst associated with the left ovary. Subcutaneous lesions  involving the anterior abdominal wall. One is near the umbilicus. The other is overlying the mid pelvis. These have the appearance of some type of skin blister. Recommend clinical correlation. Mild hypermetabolism could suggest an inflammatory process. Given patient's age IV status Kaposi's sarcoma would be another possibility. SKELETON: No findings suspicious for osseous metastatic disease. Incidental CT findings: none IMPRESSION: 1. Large left upper quadrant mass is markedly hypermetabolic and consistent with patient's known lymphoma. 2. No findings to suggest involvement of the neck, chest or pelvis or bony structures. 3. Enlarged fibroid uterus. 4. Midline anterior abdominal skin lesions. Mild hypermetabolism. These could reflect some type of skin blisters. I suppose Kaposi's sarcoma would be another consideration given the patient's HIV status. Electronically Signed   By: Marijo Sanes M.D.   On: 04/15/2020 14:20   CT BIOPSY  Result Date: 04/08/2020 INDICATION: 44 year old female with pancreatic mass versus peripancreatic adenopathy and associated involvement of the spleen. Findings are concerning for possible lymphoma. Patient presents for CT-guided core biopsy to obtain tissue diagnosis. EXAM: CT-guided core biopsy MEDICATIONS: None. ANESTHESIA/SEDATION: Moderate (conscious) sedation was employed during this procedure. A total of Versed 2 mg and Fentanyl 100 mcg was administered intravenously. Moderate Sedation Time: 12 minutes. The patient's level of consciousness and vital signs were monitored continuously by radiology nursing throughout the procedure under my direct supervision. FLUOROSCOPY TIME:  None. COMPLICATIONS: None immediate. PROCEDURE: Informed written consent was obtained from the patient after a thorough discussion of the procedural risks, benefits and alternatives. All questions were addressed. A timeout was performed prior to the initiation of the procedure. A planning axial CT scan was  performed. The mass was identified. A suitable skin entry site was selected and marked. Local anesthesia was attained by infiltration with 1% lidocaine. A small dermatotomy was made. Under intermittent CT guidance, a 17 gauge introducer needle was  carefully advanced into the margin of the mass. Multiple 18 gauge core biopsies were then obtained coaxially using the bio Pince automated biopsy device. Biopsy specimens were placed in saline and delivered to pathology for further analysis. Gel-Foam was injected through the introducer needle as it was removed. Post biopsy CT imaging demonstrates no evidence of hematoma or other immediate complication. The patient tolerated the procedure well. IMPRESSION: Technically successful CT-guided biopsy of pancreatic mass versus peripancreatic lymphadenopathy. Electronically Signed   By: Jacqulynn Cadet M.D.   On: 04/08/2020 16:32   ECHOCARDIOGRAM COMPLETE  Result Date: 04/18/2020    ECHOCARDIOGRAM REPORT   Patient Name:   PAULEEN GOLEMAN Date of Exam: 04/18/2020 Medical Rec #:  [id number]       Height:       60.0 in Accession #:    [id number]      Weight:       127.0 lb Date of Birth:  1976/03/13        BSA:          1.539 m Patient Age:    28 years        BP:           142/98 mmHg Patient Gender: F               HR:           91 bpm. Exam Location:  Inpatient Procedure: 2D Echo, Cardiac Doppler, Color Doppler and Strain Analysis STAT ECHO Indications:    Chemotherapy evaluation v87.41 / v58.11  History:        Patient has no prior history of Echocardiogram examinations.                 Risk Factors:Non-Smoker. Pleural effusion.  Sonographer:    Vickie Epley RDCS Referring Phys: [place]  1. Left ventricular ejection fraction, by estimation, is 60 to 65%. The left ventricle has normal function. The left ventricle has no regional wall motion abnormalities. Left ventricular diastolic parameters were normal. The average left ventricular global longitudinal  strain is -22.3 %.  2. Right ventricular systolic function is normal. The right ventricular size is normal. Tricuspid regurgitation signal is inadequate for assessing PA pressure.  3. The mitral valve is normal in structure. Trivial mitral valve regurgitation.  4. The aortic valve is tricuspid. Aortic valve regurgitation is not visualized. No aortic stenosis is present.  5. The inferior vena cava is normal in size with greater than 50% respiratory variability, suggesting right atrial pressure of 3 mmHg. FINDINGS  Left Ventricle: Left ventricular ejection fraction, by estimation, is 60 to 65%. The left ventricle has normal function. The left ventricle has no regional wall motion abnormalities. The average left ventricular global longitudinal strain is -22.3 %. The left ventricular internal cavity size was normal in size. There is no left ventricular hypertrophy. Left ventricular diastolic parameters were normal. Right Ventricle: The right ventricular size is normal. Right vetricular wall thickness was not assessed. Right ventricular systolic function is normal. Tricuspid regurgitation signal is inadequate for assessing PA pressure. Left Atrium: Left atrial size was normal in size. Right Atrium: Right atrial size was normal in size. Pericardium: Trivial pericardial effusion is present. Mitral Valve: The mitral valve is normal in structure. Trivial mitral valve regurgitation. Tricuspid Valve: The tricuspid valve is normal in structure. Tricuspid valve regurgitation is not demonstrated. Aortic Valve: The aortic valve is tricuspid. Aortic valve regurgitation is not visualized. No aortic stenosis is present. Pulmonic Valve: The  pulmonic valve was not well visualized. Pulmonic valve regurgitation is not visualized. Aorta: The aortic root is normal in size and structure. Venous: The inferior vena cava is normal in size with greater than 50% respiratory variability, suggesting right atrial pressure of 3 mmHg. IAS/Shunts: The  interatrial septum was not well visualized. Additional Comments: There is pleural effusion in the left lateral region.  LEFT VENTRICLE PLAX 2D LVIDd:         4.00 cm     Diastology LVIDs:         2.80 cm     LV e' lateral:   12.40 cm/s LV PW:         0.70 cm     LV E/e' lateral: 7.3 LV IVS:        0.70 cm     LV e' medial:    8.70 cm/s LVOT diam:     1.60 cm     LV E/e' medial:  10.4 LV SV:         36 LV SV Index:   23          2D Longitudinal Strain LVOT Area:     2.01 cm    2D Strain GLS Avg:     -22.3 %  LV Volumes (MOD) LV vol d, MOD A2C: 83.6 ml LV vol d, MOD A4C: 74.0 ml LV vol s, MOD A2C: 33.0 ml LV vol s, MOD A4C: 37.0 ml LV SV MOD A2C:     50.6 ml LV SV MOD A4C:     74.0 ml LV SV MOD BP:      43.6 ml RIGHT VENTRICLE RV S prime:     13.80 cm/s TAPSE (M-mode): 2.2 cm LEFT ATRIUM             Index       RIGHT ATRIUM          Index LA diam:        4.00 cm 2.60 cm/m  RA Area:     7.07 cm LA Vol (A2C):   17.7 ml 11.50 ml/m RA Volume:   9.87 ml  6.41 ml/m LA Vol (A4C):   26.1 ml 16.96 ml/m LA Biplane Vol: 23.4 ml 15.20 ml/m  AORTIC VALVE LVOT Vmax:   94.60 cm/s LVOT Vmean:  59.100 cm/s LVOT VTI:    0.177 m  AORTA Ao Root diam: 2.90 cm MITRAL VALVE MV Area (PHT): 4.80 cm    SHUNTS MV Decel Time: 158 msec    Systemic VTI:  0.18 m MV E velocity: 90.80 cm/s  Systemic Diam: 1.60 cm MV A velocity: 85.90 cm/s MV E/A ratio:  1.06 Oswaldo Milian MD Electronically signed by Oswaldo Milian MD Signature Date/Time: 04/18/2020/12:07:09 PM    Final    IR IMAGING GUIDED PORT INSERTION  Result Date: 04/15/2020 INDICATION: History of diffuse large B-cell lymphoma. In need of durable intravenous access for chemotherapy administration. EXAM: IMPLANTED PORT A CATH PLACEMENT WITH ULTRASOUND AND FLUOROSCOPIC GUIDANCE COMPARISON:  Chest CT-04/07/2020 MEDICATIONS: Ancef 2 gm IV; The antibiotic was administered within an appropriate time interval prior to skin puncture. ANESTHESIA/SEDATION: Moderate (conscious)  sedation was employed during this procedure. A total of Versed 4 mg and Fentanyl 100 mcg was administered intravenously. Moderate Sedation Time: 26 minutes. The patient's level of consciousness and vital signs were monitored continuously by radiology nursing throughout the procedure under my direct supervision. CONTRAST:  None FLUOROSCOPY TIME:  30 seconds (5 mGy) COMPLICATIONS: None immediate. PROCEDURE: The procedure, risks,  benefits, and alternatives were explained to the patient. Questions regarding the procedure were encouraged and answered. The patient understands and consents to the procedure. The right neck and chest were prepped with chlorhexidine in a sterile fashion, and a sterile drape was applied covering the operative field. Maximum barrier sterile technique with sterile gowns and gloves were used for the procedure. A timeout was performed prior to the initiation of the procedure. Local anesthesia was provided with 1% lidocaine with epinephrine. After creating a small venotomy incision, a micropuncture kit was utilized to access the internal jugular vein. Real-time ultrasound guidance was utilized for vascular access including the acquisition of a permanent ultrasound image documenting patency of the accessed vessel. The microwire was utilized to measure appropriate catheter length. A subcutaneous port pocket was then created along the upper chest wall utilizing a combination of sharp and blunt dissection. The pocket was irrigated with sterile saline. A single lumen "ISP" sized power injectable port was chosen for placement. The 8 Fr catheter was tunneled from the port pocket site to the venotomy incision. The port was placed in the pocket. The external catheter was trimmed to appropriate length. At the venotomy, an 8 Fr peel-away sheath was placed over a guidewire under fluoroscopic guidance. The catheter was then placed through the sheath and the sheath was removed. Final catheter positioning was  confirmed and documented with a fluoroscopic spot radiograph. The port was accessed with a Huber needle, aspirated and flushed with heparinized saline. The venotomy site was closed with an interrupted 4-0 Vicryl suture. The port pocket incision was closed with interrupted 2-0 Vicryl suture. The skin was opposed with a running subcuticular 4-0 Vicryl suture. Dermabond and Steri-strips were applied to both incisions. Dressings were applied. The patient tolerated the procedure well without immediate post procedural complication. FINDINGS: After catheter placement, the tip lies within the superior cavoatrial junction. The catheter aspirates and flushes normally and is ready for immediate use. IMPRESSION: Successful placement of a right internal jugular approach power injectable Port-A-Cath. The catheter is ready for immediate use. Electronically Signed   By: Sandi Mariscal M.D.   On: 04/15/2020 15:28   IR THORACENTESIS ASP PLEURAL SPACE W/IMG GUIDE  Result Date: 04/15/2020 INDICATION: Recent diagnosis diffuse large B-cell lymphoma, now with recurrent left-sided pleural effusion. Please perform ultrasound-guided thoracentesis for therapeutic purposes prior to PET-CT. EXAM: IR THORACENTESIS ASP PLEURAL SPACE W/IMG GUIDE COMPARISON:  Chest CT-04/07/2020 MEDICATIONS: None. COMPLICATIONS: None immediate. TECHNIQUE: Informed written consent was obtained from the patient after a discussion of the risks, benefits and alternatives to treatment. A timeout was performed prior to the initiation of the procedure. Initial ultrasound scanning demonstrates a small left-sided anechoic pleural effusion. The lower chest was prepped and draped in the usual sterile fashion. 1% lidocaine was used for local anesthesia. An ultrasound image was saved for documentation purposes. An 8 Fr Safe-T-Centesis catheter was introduced. The thoracentesis was performed. The catheter was removed and a dressing was applied. The patient tolerated the  procedure well without immediate post procedural complication. The patient was escorted to have an upright chest radiograph. FINDINGS: A total of approximately 200 cc of orange colored pleural fluid was removed. IMPRESSION: Successful ultrasound-guided left sided thoracentesis yielding 200 cc orange colored pleural fluid. Electronically Signed   By: Sandi Mariscal M.D.   On: 04/15/2020 16:18   US THORACENTESIS ASP PLEURAL SPACE W/IMG GUIDE  Result Date: 04/08/2020 INDICATION: Patient with history of HIV, pancreatic mass, retroperitoneal adenopathy, ascites, splenic masses, left pleural effusion. Request  received for diagnostic and therapeutic left thoracentesis. EXAM: ULTRASOUND GUIDED DIAGNOSTIC AND THERAPEUTIC LEFT THORACENTESIS MEDICATIONS: None COMPLICATIONS: None immediate. PROCEDURE: An ultrasound guided thoracentesis was thoroughly discussed with the patient and questions answered. The benefits, risks, alternatives and complications were also discussed. The patient understands and wishes to proceed with the procedure. Written consent was obtained. Ultrasound was performed to localize and mark an adequate pocket of fluid in the left chest. The area was then prepped and draped in the normal sterile fashion. 1% Lidocaine was used for local anesthesia. Under ultrasound guidance a 6 Fr Safe-T-Centesis catheter was introduced. Thoracentesis was performed. The catheter was removed and a dressing applied. FINDINGS: A total of approximately 1.5 liters of blood-tinged fluid was removed. Samples were sent to the laboratory as requested by the clinical team. Due to patient chest discomfort and coughing only the above amount of fluid was removed today. IMPRESSION: Successful ultrasound guided diagnostic and therapeutic left thoracentesis yielding 1.5 liters of pleural fluid. Read by: Rowe Robert, PA-C Electronically Signed   By: Jacqulynn Cadet M.D.   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 Hemoglobin C], childhood malaria. Patient notes that she had a CT of the abdomen sometime in 2020 that showed no acute pathology.This was not [facility]. Not accessible to [place] at this point. She did have an MRI of the abdomen in July 2019 which showed indeterminate splenic lesions.No other acute abdominal pathology.No concern with hepatocellular carcinoma.  Patient is presenting now with  #1Pancreatic mass along with retroperitoneal lymphadenopathy and diffuse splenic lesions. No internal necrosis within the mass noted. Diffuse splenomegaly. Left periaortic lymph node causing mild displacement of the third portion of the duodenum.  Significantly elevated LDH levels. CA 19-9 and CEA levels unrevealing  # 2 Left sided large pleural effusion and lung atelectasis. S/p diagnostic and therapeutic thoracentesis -- lymphocytic predominance @ 80%  Overall picture concerning for possible  High grade B cell lymphoma vs primary pancreatic malignancy (though CA 19-9 levels indeterminate and not significantly elevated.  HIV could be risk factors for high grade EBV driven lymphomas  Patient symptomatology has developed rather quickly over the last few weeks.  #3 microcytic anemia-chronic.Some element of iron deficiency versus hemoglobinopathy versus anemia of chronic disease related to malignancy. Patient has received 1 dose of IV Feraheme Will rpt 2nd dose in 1 week.  #4 history of HIV/AIDS follows with Dr. Melynda Keller and at [facility].   PLAN: -Discussed pt labwork today, 05/06/20;  of CBC w/diff and CMP is as follows: all values are WNL except for WBC at 14.8K, Hemoglobin at 10.7, HCT  at 33.9, MCV at 74.3, MCH at 23.5, RDW at 26.5, nRBC at 0.7, Neuro Abs at 10.3K, Monocytes Absolute at 1.2K, Abs Immature Granulocytes at 2.49, Glucose at 120, AST at 14, Alkaline Phosphatase at 137 -Discussed 05/06/20 of Uric Acid at 4.0: WNL -Discussed 05/06/20 of Magnesium at 1.7: WNL -Will get rid of allopurinol with next cycle   -Advised Udenyca will boost WBC  -Recommend taking B-complex supplement  -Continue salt and baking soda mouth wash  -If any fever or signs of infection- need to immediately come to ED -Recommends eating well- no restrictions  -Recommends staying active -Recommends staying hydrated -  Will get pain medication (norco) -[facility]  -Will see back on 05/23/20  FOLLOW UP: Inpatient EPOCH from 05/10/2020 for 5 days Outpatient Rituxan and Udenyca on 7/12 RTC with Dr Irene Limbo with portflush and labs on 05/23/2020  The total time spent in the appt was 20 minutes and more than 50% was on counseling and direct patient cares.  All of the patient's questions were answered with apparent satisfaction. The patient knows to call the clinic with any problems, questions or concerns.  Sullivan Lone MD MS AAHIVMS [facility] Hematology/Oncology Physician [facility]  (Office):       [phone] (Work cell):  [phone] (Fax):           [phone]  05/06/2020 1:15 PM  I, Dawayne Cirri am acting as a scribe for Dr. Sullivan Lone.   .I have reviewed the above documentation for accuracy and completeness, and I agree with the above. Brunetta Genera MD

## 2020-05-05 NOTE — Telephone Encounter (Signed)
Patient scheduled per Dr.Kale for inpatient admission 05/10/20 for 5 day EPOCH.  Dawn in bed placement contacted with patient diagnosis and contact information. Patient will be contacted with admission time on 05/10/20 Covid screening test scheduled for 05/07/20 @ 12n at [facility] testing site. Patient contacted, left voice mail to notify of all information and scheduled appointments

## 2020-05-06 ENCOUNTER — Other Ambulatory Visit: Payer: Self-pay

## 2020-05-06 ENCOUNTER — Inpatient Hospital Stay: Payer: 59 | Admitting: Hematology

## 2020-05-06 ENCOUNTER — Inpatient Hospital Stay: Payer: 59

## 2020-05-06 ENCOUNTER — Inpatient Hospital Stay: Payer: 59 | Attending: Hematology

## 2020-05-06 VITALS — BP 121/84 | HR 98 | Temp 97.6°F | Resp 17 | Ht 60.0 in | Wt 122.2 lb

## 2020-05-06 DIAGNOSIS — Z5112 Encounter for antineoplastic immunotherapy: Secondary | ICD-10-CM | POA: Insufficient documentation

## 2020-05-06 DIAGNOSIS — J90 Pleural effusion, not elsewhere classified: Secondary | ICD-10-CM

## 2020-05-06 DIAGNOSIS — C8330 Diffuse large B-cell lymphoma, unspecified site: Secondary | ICD-10-CM | POA: Insufficient documentation

## 2020-05-06 DIAGNOSIS — Z79899 Other long term (current) drug therapy: Secondary | ICD-10-CM | POA: Diagnosis not present

## 2020-05-06 DIAGNOSIS — B20 Human immunodeficiency virus [HIV] disease: Secondary | ICD-10-CM | POA: Insufficient documentation

## 2020-05-06 DIAGNOSIS — Z95828 Presence of other vascular implants and grafts: Secondary | ICD-10-CM

## 2020-05-06 DIAGNOSIS — C8338 Diffuse large B-cell lymphoma, lymph nodes of multiple sites: Secondary | ICD-10-CM

## 2020-05-06 DIAGNOSIS — D702 Other drug-induced agranulocytosis: Secondary | ICD-10-CM

## 2020-05-06 LAB — CBC WITH DIFFERENTIAL/PLATELET
Abs Immature Granulocytes: 2.49 10*3/uL — ABNORMAL HIGH (ref 0.00–0.07)
Basophils Absolute: 0.1 10*3/uL (ref 0.0–0.1)
Basophils Relative: 1 %
Eosinophils Absolute: 0 10*3/uL (ref 0.0–0.5)
Eosinophils Relative: 0 %
HCT: 33.9 % — ABNORMAL LOW (ref 36.0–46.0)
Hemoglobin: 10.7 g/dL — ABNORMAL LOW (ref 12.0–15.0)
Immature Granulocytes: 17 %
Lymphocytes Relative: 5 %
Lymphs Abs: 0.8 10*3/uL (ref 0.7–4.0)
MCH: 23.5 pg — ABNORMAL LOW (ref 26.0–34.0)
MCHC: 31.6 g/dL (ref 30.0–36.0)
MCV: 74.3 fL — ABNORMAL LOW (ref 80.0–100.0)
Monocytes Absolute: 1.2 10*3/uL — ABNORMAL HIGH (ref 0.1–1.0)
Monocytes Relative: 8 %
Neutro Abs: 10.3 10*3/uL — ABNORMAL HIGH (ref 1.7–7.7)
Neutrophils Relative %: 69 %
Platelets: 365 10*3/uL (ref 150–400)
RBC: 4.56 MIL/uL (ref 3.87–5.11)
RDW: 26.5 % — ABNORMAL HIGH (ref 11.5–15.5)
WBC: 14.8 10*3/uL — ABNORMAL HIGH (ref 4.0–10.5)
nRBC: 0.7 % — ABNORMAL HIGH (ref 0.0–0.2)

## 2020-05-06 LAB — CMP (CANCER CENTER ONLY)
ALT: 25 U/L (ref 0–44)
AST: 14 U/L — ABNORMAL LOW (ref 15–41)
Albumin: 3.7 g/dL (ref 3.5–5.0)
Alkaline Phosphatase: 137 U/L — ABNORMAL HIGH (ref 38–126)
Anion gap: 10 (ref 5–15)
BUN: 12 mg/dL (ref 6–20)
CO2: 22 mmol/L (ref 22–32)
Calcium: 10.1 mg/dL (ref 8.9–10.3)
Chloride: 106 mmol/L (ref 98–111)
Creatinine: 0.68 mg/dL (ref 0.44–1.00)
GFR, Est AFR Am: >60 mL/min (ref >60)
GFR, Estimated: >60 mL/min (ref >60)
Glucose, Bld: 120 mg/dL — ABNORMAL HIGH (ref 70–99)
Potassium: 3.9 mmol/L (ref 3.5–5.1)
Sodium: 138 mmol/L (ref 135–145)
Total Bilirubin: 0.3 mg/dL (ref 0.3–1.2)
Total Protein: 7.1 g/dL (ref 6.5–8.1)

## 2020-05-06 LAB — URIC ACID: Uric Acid, Serum: 4 mg/dL (ref 2.5–7.1)

## 2020-05-06 LAB — MAGNESIUM: Magnesium: 1.7 mg/dL (ref 1.7–2.4)

## 2020-05-06 MED ORDER — SODIUM CHLORIDE 0.9% FLUSH
10.0000 mL | Freq: Once | INTRAVENOUS | Status: AC
  Administered 2020-05-06: 10 mL
  Filled 2020-05-06: qty 10

## 2020-05-06 MED ORDER — HEPARIN SOD (PORK) LOCK FLUSH 100 UNIT/ML IV SOLN
500.0000 [IU] | Freq: Once | INTRAVENOUS | Status: AC
  Administered 2020-05-06: 500 [IU]
  Filled 2020-05-06: qty 5

## 2020-05-07 ENCOUNTER — Other Ambulatory Visit (HOSPITAL_COMMUNITY)
Admission: RE | Admit: 2020-05-07 | Discharge: 2020-05-07 | Disposition: A | Discharge status: Home / Self Care | Payer: 59 | Source: Ambulatory Visit | Attending: Hematology | Admitting: Hematology

## 2020-05-07 DIAGNOSIS — Z20822 Contact with and (suspected) exposure to covid-19: Secondary | ICD-10-CM | POA: Insufficient documentation

## 2020-05-07 DIAGNOSIS — Z01812 Encounter for preprocedural laboratory examination: Secondary | ICD-10-CM | POA: Insufficient documentation

## 2020-05-07 LAB — SARS CORONAVIRUS 2 (TAT 6-24 HRS): SARS Coronavirus 2: NEGATIVE

## 2020-05-10 ENCOUNTER — Encounter (HOSPITAL_COMMUNITY): Payer: Self-pay | Admitting: Hematology

## 2020-05-10 ENCOUNTER — Other Ambulatory Visit: Payer: Self-pay

## 2020-05-10 ENCOUNTER — Inpatient Hospital Stay (HOSPITAL_COMMUNITY)
Admission: AD | Admit: 2020-05-10 | Discharge: 2020-05-14 | DRG: 975 | Disposition: A | Discharge status: Home / Self Care | Payer: 59 | Source: Ambulatory Visit | Attending: Hematology | Admitting: Hematology

## 2020-05-10 DIAGNOSIS — C469 Kaposi's sarcoma, unspecified: Secondary | ICD-10-CM | POA: Diagnosis present

## 2020-05-10 DIAGNOSIS — Z79899 Other long term (current) drug therapy: Secondary | ICD-10-CM

## 2020-05-10 DIAGNOSIS — R112 Nausea with vomiting, unspecified: Secondary | ICD-10-CM | POA: Diagnosis present

## 2020-05-10 DIAGNOSIS — Z596 Low income: Secondary | ICD-10-CM

## 2020-05-10 DIAGNOSIS — C8338 Diffuse large B-cell lymphoma, lymph nodes of multiple sites: Secondary | ICD-10-CM | POA: Diagnosis present

## 2020-05-10 DIAGNOSIS — J9811 Atelectasis: Secondary | ICD-10-CM | POA: Diagnosis present

## 2020-05-10 DIAGNOSIS — C259 Malignant neoplasm of pancreas, unspecified: Secondary | ICD-10-CM | POA: Diagnosis present

## 2020-05-10 DIAGNOSIS — D630 Anemia in neoplastic disease: Secondary | ICD-10-CM | POA: Diagnosis present

## 2020-05-10 DIAGNOSIS — D509 Iron deficiency anemia, unspecified: Secondary | ICD-10-CM | POA: Diagnosis present

## 2020-05-10 DIAGNOSIS — Z7189 Other specified counseling: Secondary | ICD-10-CM

## 2020-05-10 DIAGNOSIS — K5900 Constipation, unspecified: Secondary | ICD-10-CM | POA: Diagnosis present

## 2020-05-10 DIAGNOSIS — Z7901 Long term (current) use of anticoagulants: Secondary | ICD-10-CM

## 2020-05-10 DIAGNOSIS — Z91410 Personal history of adult physical and sexual abuse: Secondary | ICD-10-CM

## 2020-05-10 DIAGNOSIS — J90 Pleural effusion, not elsewhere classified: Secondary | ICD-10-CM | POA: Diagnosis present

## 2020-05-10 DIAGNOSIS — Z6824 Body mass index (BMI) 24.0-24.9, adult: Secondary | ICD-10-CM | POA: Diagnosis not present

## 2020-05-10 DIAGNOSIS — B1910 Unspecified viral hepatitis B without hepatic coma: Secondary | ICD-10-CM | POA: Diagnosis present

## 2020-05-10 DIAGNOSIS — R6881 Early satiety: Secondary | ICD-10-CM | POA: Diagnosis present

## 2020-05-10 DIAGNOSIS — Z20822 Contact with and (suspected) exposure to covid-19: Secondary | ICD-10-CM | POA: Diagnosis present

## 2020-05-10 DIAGNOSIS — R7402 Elevation of levels of lactic acid dehydrogenase (LDH): Secondary | ICD-10-CM | POA: Diagnosis present

## 2020-05-10 DIAGNOSIS — C7889 Secondary malignant neoplasm of other digestive organs: Secondary | ICD-10-CM | POA: Diagnosis present

## 2020-05-10 DIAGNOSIS — Z9221 Personal history of antineoplastic chemotherapy: Secondary | ICD-10-CM | POA: Diagnosis not present

## 2020-05-10 DIAGNOSIS — Z5111 Encounter for antineoplastic chemotherapy: Secondary | ICD-10-CM

## 2020-05-10 DIAGNOSIS — R630 Anorexia: Secondary | ICD-10-CM | POA: Diagnosis present

## 2020-05-10 DIAGNOSIS — B20 Human immunodeficiency virus [HIV] disease: Secondary | ICD-10-CM | POA: Diagnosis present

## 2020-05-10 LAB — CBC
HCT: 30.5 % — ABNORMAL LOW (ref 36.0–46.0)
Hemoglobin: 9.5 g/dL — ABNORMAL LOW (ref 12.0–15.0)
MCH: 24.1 pg — ABNORMAL LOW (ref 26.0–34.0)
MCHC: 31.1 g/dL (ref 30.0–36.0)
MCV: 77.2 fL — ABNORMAL LOW (ref 80.0–100.0)
Platelets: 294 K/uL (ref 150–400)
RBC: 3.95 MIL/uL (ref 3.87–5.11)
RDW: 28.5 % — ABNORMAL HIGH (ref 11.5–15.5)
WBC: 8.1 K/uL (ref 4.0–10.5)
nRBC: 0.9 % — ABNORMAL HIGH (ref 0.0–0.2)

## 2020-05-10 LAB — CREATININE, SERUM
Creatinine, Ser: 0.62 mg/dL (ref 0.44–1.00)
GFR calc Af Amer: >60 mL/min
GFR calc non Af Amer: >60 mL/min

## 2020-05-10 LAB — PREGNANCY, URINE: Preg Test, Ur: NEGATIVE

## 2020-05-10 MED ORDER — POLYETHYLENE GLYCOL 3350 17 G PO PACK: 17.0000 g | PACK | Freq: Every day | ORAL | Status: DC | PRN

## 2020-05-10 MED ORDER — SODIUM BICARBONATE/SODIUM CHLORIDE MOUTHWASH
1.0000 "application " | Freq: Four times a day (QID) | OROMUCOSAL | Status: DC
  Administered 2020-05-10 – 2020-05-14 (×15): 1 via OROMUCOSAL
  Filled 2020-05-10: qty 1000

## 2020-05-10 MED ORDER — POTASSIUM CHLORIDE CRYS ER 20 MEQ PO TBCR
20.0000 meq | EXTENDED_RELEASE_TABLET | Freq: Every day | ORAL | Status: DC
  Administered 2020-05-11 – 2020-05-14 (×4): 20 meq via ORAL
  Filled 2020-05-10 (×4): qty 1

## 2020-05-10 MED ORDER — PROCHLORPERAZINE MALEATE 10 MG PO TABS
10.0000 mg | ORAL_TABLET | Freq: Four times a day (QID) | ORAL | Status: DC | PRN
  Administered 2020-05-12 – 2020-05-13 (×2): 10 mg via ORAL
  Filled 2020-05-10 (×2): qty 1

## 2020-05-10 MED ORDER — POLYETHYLENE GLYCOL 3350 17 G PO PACK
17.0000 g | PACK | Freq: Every day | ORAL | Status: DC
  Administered 2020-05-10 – 2020-05-14 (×5): 17 g via ORAL
  Filled 2020-05-10 (×5): qty 1

## 2020-05-10 MED ORDER — SODIUM CHLORIDE 0.9% FLUSH: 10.0000 mL | INTRAVENOUS | Status: DC | PRN

## 2020-05-10 MED ORDER — PANTOPRAZOLE SODIUM 40 MG PO TBEC
40.0000 mg | DELAYED_RELEASE_TABLET | Freq: Every day | ORAL | Status: DC
  Administered 2020-05-10 – 2020-05-14 (×5): 40 mg via ORAL
  Filled 2020-05-10 (×5): qty 1

## 2020-05-10 MED ORDER — VITAMIN D 25 MCG (1000 UNIT) PO TABS
1000.0000 [IU] | ORAL_TABLET | Freq: Every day | ORAL | Status: DC
  Administered 2020-05-10 – 2020-05-13 (×4): 1000 [IU] via ORAL
  Filled 2020-05-10 (×4): qty 1

## 2020-05-10 MED ORDER — ADULT MULTIVITAMIN W/MINERALS CH
1.0000 | ORAL_TABLET | Freq: Every day | ORAL | Status: DC
  Administered 2020-05-10 – 2020-05-13 (×4): 1 via ORAL
  Filled 2020-05-10 (×4): qty 1

## 2020-05-10 MED ORDER — ACETAMINOPHEN 325 MG PO TABS: 650.0000 mg | ORAL_TABLET | ORAL | Status: DC | PRN

## 2020-05-10 MED ORDER — PREDNISONE 20 MG PO TABS
60.0000 mg | ORAL_TABLET | Freq: Every day | ORAL | Status: AC
  Administered 2020-05-10 – 2020-05-14 (×5): 60 mg via ORAL
  Filled 2020-05-10 (×5): qty 3

## 2020-05-10 MED ORDER — COLD PACK MISC ONCOLOGY
1.0000 | Freq: Once | Status: DC | PRN
  Filled 2020-05-10: qty 1

## 2020-05-10 MED ORDER — IBUPROFEN 200 MG PO TABS: 400.0000 mg | ORAL_TABLET | Freq: Four times a day (QID) | ORAL | Status: DC | PRN

## 2020-05-10 MED ORDER — VINCRISTINE SULFATE CHEMO INJECTION 1 MG/ML
Freq: Once | INTRAVENOUS | Status: AC
  Filled 2020-05-10: qty 8

## 2020-05-10 MED ORDER — ENOXAPARIN SODIUM 40 MG/0.4ML ~~LOC~~ SOLN
40.0000 mg | SUBCUTANEOUS | Status: DC
  Administered 2020-05-10 – 2020-05-13 (×4): 40 mg via SUBCUTANEOUS
  Filled 2020-05-10 (×4): qty 0.4

## 2020-05-10 MED ORDER — HOT PACK MISC ONCOLOGY
1.0000 | Freq: Once | Status: DC | PRN
  Filled 2020-05-10: qty 1

## 2020-05-10 MED ORDER — SODIUM CHLORIDE 0.9% FLUSH
10.0000 mL | Freq: Two times a day (BID) | INTRAVENOUS | Status: DC
  Administered 2020-05-10: 20 mL
  Administered 2020-05-10 – 2020-05-11 (×3): 10 mL
  Administered 2020-05-12: 20 mL
  Administered 2020-05-14: 10 mL

## 2020-05-10 MED ORDER — LIDOCAINE-PRILOCAINE 2.5-2.5 % EX CREA
TOPICAL_CREAM | Freq: Once | CUTANEOUS | Status: AC
  Administered 2020-05-10: 1 via TOPICAL
  Filled 2020-05-10: qty 5

## 2020-05-10 MED ORDER — LORAZEPAM 0.5 MG PO TABS: 0.5000 mg | ORAL_TABLET | Freq: Four times a day (QID) | ORAL | Status: DC | PRN

## 2020-05-10 MED ORDER — SODIUM CHLORIDE 0.9 % IV SOLN
Freq: Once | INTRAVENOUS | Status: AC
  Administered 2020-05-10: 18 mg via INTRAVENOUS
  Filled 2020-05-10: qty 4

## 2020-05-10 MED ORDER — ONDANSETRON HCL 8 MG PO TABS: 8.0000 mg | ORAL_TABLET | Freq: Three times a day (TID) | ORAL | Status: DC | PRN

## 2020-05-10 MED ORDER — ENSURE ENLIVE PO LIQD
237.0000 mL | Freq: Two times a day (BID) | ORAL | Status: DC
  Administered 2020-05-10 – 2020-05-14 (×8): 237 mL via ORAL

## 2020-05-10 MED ORDER — SODIUM CHLORIDE 0.9 % IV SOLN: INTRAVENOUS | Status: DC

## 2020-05-10 MED ORDER — CHLORHEXIDINE GLUCONATE CLOTH 2 % EX PADS
6.0000 | MEDICATED_PAD | Freq: Every day | CUTANEOUS | Status: DC
  Administered 2020-05-10 – 2020-05-14 (×5): 6 via TOPICAL

## 2020-05-10 MED ORDER — BICTEGRAVIR-EMTRICITAB-TENOFOV 50-200-25 MG PO TABS
1.0000 | ORAL_TABLET | Freq: Every day | ORAL | Status: DC
  Administered 2020-05-11 – 2020-05-14 (×4): 1 via ORAL
  Filled 2020-05-10 (×4): qty 1

## 2020-05-10 NOTE — H&P (Addendum)
Heather [name]  [phone] Fax:([phone]) [facility]  Reason for Referral: Cycle #2 EPOCH-R  HPI: Heather [name] is a wonderful 44 y.o. female with a past medical history significant for hepatitis B, HIV, chronic anemia who presented to [place] with abdominal pain. She had a 2-week history of early satiety, poor appetite and, 5 pound weight loss. Abdominal pain is typically postprandial and mainly at night after dinner while laying in bed. Has been associated with nausea improves after vomiting. Labs on admission showed a hemoglobin of 9.3, MCV 67.4, platelet count 554,000. Lipase was mildly elevated at 123. 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  During her initial consult, she was having ongoingpain is primarily in her epigastric area and radiates to the left side of her abdomen. States pain is worse when laying down after she eats. She states that she has had this pain for at least 2 weeks. Her abdominal pain is worse after eating. She has had some nausea and intermittent vomiting. Vomiting makes her pain better. She was attributing her symptoms to some the medications that she was taking for fertility treatments. She reports a poor appetite and about a 5 pound weight loss. She is not having any fevers or chills. Denies night sweats. She has not noticed any palpable lymphadenopathy. The patient underwent a biopsy of her pancreatic mass on 04/08/2020 ([id number]) with results feeling diffuse  large B-cell lymphoma.  It was recommended for her to undergo systemic chemotherapy with EPOCH-R.   Today, the patient reports mild abdominal discomfort which comes and goes. She reported having some fatigue the week following chemotherapy but reports fatigue is now improving.  She is no longer having any abdominal pain, nausea, vomiting.  Denies chest pain or shortness of breath.  No bleeding reported.  Appetite is significantly improved. She is here for admission for cycle #2 of EPOCH-R.     Past Medical History:  Diagnosis Date  . Anemia   . Diffuse large B-cell lymphoma of lymph nodes of multiple regions ([place]) 04/12/2020  . 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 History of blood transfusion    childhood  . HIV (human immunodeficiency virus infection) ([place])   . Immune deficiency disorder [facility])   :   Past Surgical History:  Procedure Laterality Date  . CHROMOPERTUBATION Bilateral 11/29/2016   Procedure: CHROMOPERTUBATION;  Surgeon: Waymon Amato, MD;  Location: [facility];  Service: Gynecology;  Laterality: Bilateral;  fallopian tubes  . ENDOMETRIAL BIOPSY    . IR IMAGING GUIDED PORT INSERTION  04/15/2020  . IR THORACENTESIS ASP PLEURAL SPACE W/IMG GUIDE  04/15/2020  . MYOMECTOMY N/A 11/29/2016   Procedure: MYOMECTOMY;  Surgeon: Waymon Amato, MD;  Location: [facility];  Service: Gynecology;  Laterality: N/A;  :   Current Facility-Administered Medications  Medication Dose Route Frequency Provider Last Rate Last Admin  . acetaminophen (TYLENOL) tablet 650 mg  650 mg Oral Q4H PRN Maryanna Shape, NP      . Derrill Memo ON 05/11/2020] bictegravir-emtricitabine-tenofovir AF (BIKTARVY) 50-200-25 MG per tablet 1 tablet  1 tablet Oral Q breakfast Curcio,  Roselie Awkward, NP      . Chlorhexidine Gluconate Cloth 2 % PADS 6 each  6 each Topical Daily Curcio, Kristin R, NP      . cholecalciferol (VITAMIN D3) tablet 1,000 Units  1,000 Units Oral QHS Curcio, Kristin R, NP      . enoxaparin (LOVENOX) injection 40 mg  40  mg Subcutaneous Q24H Curcio, Kristin R, NP      . feeding supplement (ENSURE ENLIVE) (ENSURE ENLIVE) liquid 237 mL  237 mL Oral BID BM Curcio, Kristin R, NP      . ibuprofen (ADVIL) tablet 400 mg  400 mg Oral Q6H PRN Curcio, Kristin R, NP      . LORazepam (ATIVAN) tablet 0.5 mg  0.5 mg Oral Q6H PRN Curcio, Roselie Awkward, NP      . multivitamin with minerals tablet 1 tablet  1 tablet Oral QHS Curcio, Kristin R, NP      . ondansetron (ZOFRAN) tablet 8 mg  8 mg Oral Q8H PRN Curcio, Kristin R, NP      . pantoprazole (PROTONIX) EC tablet 40 mg  40 mg Oral Daily Curcio, Kristin R, NP      . polyethylene glycol (MIRALAX / GLYCOLAX) packet 17 g  17 g Oral Daily Curcio, Roselie Awkward, NP      . Derrill Memo ON 05/11/2020] potassium chloride SA (KLOR-CON) CR tablet 20 mEq  20 mEq Oral Daily Curcio, Kristin R, NP      . prochlorperazine (COMPAZINE) tablet 10 mg  10 mg Oral Q6H PRN Curcio, Kristin R, NP      . sodium bicarbonate/sodium chloride mouthwash 5631SH  1 application Mouth Rinse QID Curcio, Kristin R, NP      . sodium chloride flush (NS) 0.9 % injection 10-40 mL  10-40 mL Intracatheter Q12H Curcio, Kristin R, NP      . sodium chloride flush (NS) 0.9 % injection 10-40 mL  10-40 mL Intracatheter PRN Curcio, Roselie Awkward, NP         No Known Allergies:   Family History  Problem Relation Age of Onset  . Diabetes Mother   . Hypertension Mother   :   Social History   Socioeconomic History  . Marital status: Married    Spouse name: Not on file  . Number of children: Not on file  . Years of education: Not on file  . Highest education level: Not on file  Occupational History  . Not on file  Tobacco Use  . Smoking status: Never Smoker  . Smokeless tobacco: Never Used  Substance and Sexual Activity  . Alcohol use: Yes    Comment: occ  . Drug use: No  . Sexual activity: Yes  Other Topics Concern  . Not on file  Social History Narrative  . Not on file   Social Determinants of Health   Financial  Resource Strain:   . Difficulty of Paying Living Expenses:   Food Insecurity:   . Worried About Charity fundraiser in the Last Year:   . Arboriculturist in the Last Year:   Transportation Needs:   . Film/video editor (Medical):   Marland Kitchen Lack of Transportation (Non-Medical):   Physical Activity:   . Days of Exercise per Week:   . Minutes of Exercise per Session:   Stress:   . Feeling of Stress :   Social Connections:   . Frequency of Communication with Friends and Family:   . Frequency of Social Gatherings with Friends  and Family:   . Attends Religious Services:   . Active Member of Clubs or Organizations:   . Attends Archivist Meetings:   Marland Kitchen Marital Status:   Intimate Partner Violence:   . Fear of Current or Ex-Partner:   . Emotionally Abused:   Marland Kitchen Physically Abused:   . Sexually Abused:   :  Review of Systems: A comprehensive 14 point review of systems was negative except as noted in the HPI.  Exam: Patient Vitals for the past 24 hrs:  BP Temp Temp src Pulse Resp SpO2  05/10/20 1109 114/87 98.2 F (36.8 C) Oral 99 16 100 %    General:  well-nourished in no acute distress.   Eyes:  no scleral icterus.   ENT:  There were no oropharyngeal lesions.   Neck was without thyromegaly.   Lymphatics:  Negative cervical, supraclavicular or axillary adenopathy.   Respiratory: Minutes left base. Cardiovascular:  Regular rate and rhythm, S1/S2, without murmur, rub or gallop.  There was no pedal edema.   GI:  abdomen was soft, flat, nontender, nondistended, without organomegaly.   Musculoskeletal:  no spinal tenderness of palpation of vertebral spine.   Skin: No bruising or petechiae.  Keloids noted over her chest and abdomen. Neuro exam was nonfocal. Patient was alert and oriented.  Attention was good.   Language was appropriate.  Mood was normal without depression.  Speech was not pressured.  Thought content was not tangential.     Lab Results  Component Value Date    WBC 14.8 (H) 05/06/2020   HGB 10.7 (L) 05/06/2020   HCT 33.9 (L) 05/06/2020   PLT 365 05/06/2020   GLUCOSE 120 (H) 05/06/2020   ALT 25 05/06/2020   AST 14 (L) 05/06/2020   NA 138 05/06/2020   K 3.9 05/06/2020   CL 106 05/06/2020   CREATININE 0.68 05/06/2020   BUN 12 05/06/2020   CO2 22 05/06/2020    DG Chest 2 View  Result Date: 04/14/2020 CLINICAL DATA:  Shortness of breath EXAM: CHEST - 2 VIEW COMPARISON:  04/08/2020 FINDINGS: Moderate to large left pleural effusion, slightly decreased since prior study. Left base atelectasis or infiltrate. Right lung clear. Heart is normal size. IMPRESSION: Moderate to large left effusion with left base atelectasis or infiltrate. Effusion slightly decreased since prior study. Electronically Signed   By: Rolm Baptise M.D.   On: 04/14/2020 14:39   DG Abd 1 View  Result Date: 04/14/2020 CLINICAL DATA:  Shortness of breath, abdominal swelling EXAM: ABDOMEN - 1 VIEW COMPARISON:  CT 04/06/2020 FINDINGS: Nonobstructive bowel gas pattern. No free air or suspicious calcification. Moderate stool burden throughout the colon. Prominent soft tissue density seen in the left upper abdomen corresponding to the previously seen large left upper quadrant mass involving the pancreas and spleen on prior CT. IMPRESSION: No evidence of bowel obstruction or free air. Prominent soft tissue density in the left upper quadrant corresponding to the large mass seen involving much of the pancreas and spleen on prior CT. Electronically Signed   By: Rolm Baptise M.D.   On: 04/14/2020 14:40   NM PET Image Initial (PI) Skull Base To Thigh  Result Date: 04/15/2020 CLINICAL DATA:  Initial treatment strategy for diffuse large B-cell lymphoma. EXAM: NUCLEAR MEDICINE PET SKULL BASE TO THIGH TECHNIQUE: 6.4 mCi F-18 FDG was injected intravenously. Full-ring PET imaging was performed from the skull base to thigh after the radiotracer. CT data was obtained and used for attenuation correction and  anatomic  localization. Fasting blood glucose: 101 mg/dl COMPARISON:  CT scan 04/06/2020 FINDINGS: Mediastinal blood pool activity: SUV max 1.84 Liver activity: SUV max 2.61 NECK: No hypermetabolic lymph nodes in the neck. Incidental CT findings: none CHEST: No hypermetabolic mediastinal or hilar nodes. No suspicious pulmonary nodules on the CT scan. No hypermetabolic breast lesions, supraclavicular or axillary adenopathy. Incidental CT findings: Persistent left pleural effusion and overlying left lower lobe atelectasis. ABDOMEN/PELVIS: Large left upper quadrant abdominal mass involving the spleen, stomach and retroperitoneum is markedly hypermetabolic with SUV max of 99.24. Associated contralateral retroperitoneal lymphadenopathy is hypermetabolic with SUV max of 26.83. No pelvic lymphadenopathy. No inguinal lymphadenopathy. No worrisome hepatic lesions. Incidental CT findings: Markedly enlarged fibroid uterus is noted. Simple appearing cyst associated with the left ovary. Subcutaneous lesions involving the anterior abdominal wall. One is near the umbilicus. The other is overlying the mid pelvis. These have the appearance of some type of skin blister. Recommend clinical correlation. Mild hypermetabolism could suggest an inflammatory process. Given patient's age IV status Kaposi's sarcoma would be another possibility. SKELETON: No findings suspicious for osseous metastatic disease. Incidental CT findings: none IMPRESSION: 1. Large left upper quadrant mass is markedly hypermetabolic and consistent with patient's known lymphoma. 2. No findings to suggest involvement of the neck, chest or pelvis or bony structures. 3. Enlarged fibroid uterus. 4. Midline anterior abdominal skin lesions. Mild hypermetabolism. These could reflect some type of skin blisters. I suppose Kaposi's sarcoma would be another consideration given the patient's HIV status. Electronically Signed   By: Marijo Sanes M.D.   On: 04/15/2020 14:20    ECHOCARDIOGRAM COMPLETE  Result Date: 04/18/2020    ECHOCARDIOGRAM REPORT   Patient Name:   BLONDINE HOTTEL Date of Exam: 04/18/2020 Medical Rec #:  [id number]       Height:       60.0 in Accession #:    [id number]      Weight:       127.0 lb Date of Birth:  [birth year]        BSA:          1.539 m Patient Age:    10 years        BP:           142/98 mmHg Patient Gender: F               HR:           91 bpm. Exam Location:  Inpatient Procedure: 2D Echo, Cardiac Doppler, Color Doppler and Strain Analysis STAT ECHO Indications:    Chemotherapy evaluation v87.41 / v58.11  History:        Patient has no prior history of Echocardiogram examinations.                 Risk Factors:Non-Smoker. Pleural effusion.  Sonographer:    Vickie Epley RDCS Referring Phys: [place]  1. Left ventricular ejection fraction, by estimation, is 60 to 65%. The left ventricle has normal function. The left ventricle has no regional wall motion abnormalities. Left ventricular diastolic parameters were normal. The average left ventricular global longitudinal strain is -22.3 %.  2. Right ventricular systolic function is normal. The right ventricular size is normal. Tricuspid regurgitation signal is inadequate for assessing PA pressure.  3. The mitral valve is normal in structure. Trivial mitral valve regurgitation.  4. The aortic valve is tricuspid. Aortic valve regurgitation is not visualized. No aortic stenosis is present.  5. The inferior vena cava is normal  in size with greater than 50% respiratory variability, suggesting right atrial pressure of 3 mmHg. FINDINGS  Left Ventricle: Left ventricular ejection fraction, by estimation, is 60 to 65%. The left ventricle has normal function. The left ventricle has no regional wall motion abnormalities. The average left ventricular global longitudinal strain is -22.3 %. The left ventricular internal cavity size was normal in size. There is no left ventricular hypertrophy. Left  ventricular diastolic parameters were normal. Right Ventricle: The right ventricular size is normal. Right vetricular wall thickness was not assessed. Right ventricular systolic function is normal. Tricuspid regurgitation signal is inadequate for assessing PA pressure. Left Atrium: Left atrial size was normal in size. Right Atrium: Right atrial size was normal in size. Pericardium: Trivial pericardial effusion is present. Mitral Valve: The mitral valve is normal in structure. Trivial mitral valve regurgitation. Tricuspid Valve: The tricuspid valve is normal in structure. Tricuspid valve regurgitation is not demonstrated. Aortic Valve: The aortic valve is tricuspid. Aortic valve regurgitation is not visualized. No aortic stenosis is present. Pulmonic Valve: The pulmonic valve was not well visualized. Pulmonic valve regurgitation is not visualized. Aorta: The aortic root is normal in size and structure. Venous: The inferior vena cava is normal in size with greater than 50% respiratory variability, suggesting right atrial pressure of 3 mmHg. IAS/Shunts: The interatrial septum was not well visualized. Additional Comments: There is pleural effusion in the left lateral region.  LEFT VENTRICLE PLAX 2D LVIDd:         4.00 cm     Diastology LVIDs:         2.80 cm     LV e' lateral:   12.40 cm/s LV PW:         0.70 cm     LV E/e' lateral: 7.3 LV IVS:        0.70 cm     LV e' medial:    8.70 cm/s LVOT diam:     1.60 cm     LV E/e' medial:  10.4 LV SV:         36 LV SV Index:   23          2D Longitudinal Strain LVOT Area:     2.01 cm    2D Strain GLS Avg:     -22.3 %  LV Volumes (MOD) LV vol d, MOD A2C: 83.6 ml LV vol d, MOD A4C: 74.0 ml LV vol s, MOD A2C: 33.0 ml LV vol s, MOD A4C: 37.0 ml LV SV MOD A2C:     50.6 ml LV SV MOD A4C:     74.0 ml LV SV MOD BP:      43.6 ml RIGHT VENTRICLE RV S prime:     13.80 cm/s TAPSE (M-mode): 2.2 cm LEFT ATRIUM             Index       RIGHT ATRIUM          Index LA diam:        4.00 cm 2.60  cm/m  RA Area:     7.07 cm LA Vol (A2C):   17.7 ml 11.50 ml/m RA Volume:   9.87 ml  6.41 ml/m LA Vol (A4C):   26.1 ml 16.96 ml/m LA Biplane Vol: 23.4 ml 15.20 ml/m  AORTIC VALVE LVOT Vmax:   94.60 cm/s LVOT Vmean:  59.100 cm/s LVOT VTI:    0.177 m  AORTA Ao Root diam: 2.90 cm MITRAL VALVE MV Area (PHT): 4.80 cm  SHUNTS MV Decel Time: 158 msec    Systemic VTI:  0.18 m MV E velocity: 90.80 cm/s  Systemic Diam: 1.60 cm MV A velocity: 85.90 cm/s MV E/A ratio:  1.06 Oswaldo Milian MD Electronically signed by Oswaldo Milian MD Signature Date/Time: 04/18/2020/12:07:09 PM    Final    IR IMAGING GUIDED PORT INSERTION  Result Date: 04/15/2020 INDICATION: History of diffuse large B-cell lymphoma. In need of durable intravenous access for chemotherapy administration. EXAM: IMPLANTED PORT A CATH PLACEMENT WITH ULTRASOUND AND FLUOROSCOPIC GUIDANCE COMPARISON:  Chest CT-04/07/2020 MEDICATIONS: Ancef 2 gm IV; The antibiotic was administered within an appropriate time interval prior to skin puncture. ANESTHESIA/SEDATION: Moderate (conscious) sedation was employed during this procedure. A total of Versed 4 mg and Fentanyl 100 mcg was administered intravenously. Moderate Sedation Time: 26 minutes. The patient's level of consciousness and vital signs were monitored continuously by radiology nursing throughout the procedure under my direct supervision. CONTRAST:  None FLUOROSCOPY TIME:  30 seconds (5 mGy) COMPLICATIONS: None immediate. PROCEDURE: The procedure, risks, benefits, and alternatives were explained to the patient. Questions regarding the procedure were encouraged and answered. The patient understands and consents to the procedure. The right neck and chest were prepped with chlorhexidine in a sterile fashion, and a sterile drape was applied covering the operative field. Maximum barrier sterile technique with sterile gowns and gloves were used for the procedure. A timeout was performed prior to the  initiation of the procedure. Local anesthesia was provided with 1% lidocaine with epinephrine. After creating a small venotomy incision, a micropuncture kit was utilized to access the internal jugular vein. Real-time ultrasound guidance was utilized for vascular access including the acquisition of a permanent ultrasound image documenting patency of the accessed vessel. The microwire was utilized to measure appropriate catheter length. A subcutaneous port pocket was then created along the upper chest wall utilizing a combination of sharp and blunt dissection. The pocket was irrigated with sterile saline. A single lumen "ISP" sized power injectable port was chosen for placement. The 8 Fr catheter was tunneled from the port pocket site to the venotomy incision. The port was placed in the pocket. The external catheter was trimmed to appropriate length. At the venotomy, an 8 Fr peel-away sheath was placed over a guidewire under fluoroscopic guidance. The catheter was then placed through the sheath and the sheath was removed. Final catheter positioning was confirmed and documented with a fluoroscopic spot radiograph. The port was accessed with a Huber needle, aspirated and flushed with heparinized saline. The venotomy site was closed with an interrupted 4-0 Vicryl suture. The port pocket incision was closed with interrupted 2-0 Vicryl suture. The skin was opposed with a running subcuticular 4-0 Vicryl suture. Dermabond and Steri-strips were applied to both incisions. Dressings were applied. The patient tolerated the procedure well without immediate post procedural complication. FINDINGS: After catheter placement, the tip lies within the superior cavoatrial junction. The catheter aspirates and flushes normally and is ready for immediate use. IMPRESSION: Successful placement of a right internal jugular approach power injectable Port-A-Cath. The catheter is ready for immediate use. Electronically Signed   By: Sandi Mariscal  M.D.   On: 04/15/2020 15:28   IR THORACENTESIS ASP PLEURAL SPACE W/IMG GUIDE  Result Date: 04/15/2020 INDICATION: Recent diagnosis diffuse large B-cell lymphoma, now with recurrent left-sided pleural effusion. Please perform ultrasound-guided thoracentesis for therapeutic purposes prior to PET-CT. EXAM: IR THORACENTESIS ASP PLEURAL SPACE W/IMG GUIDE COMPARISON:  Chest CT-04/07/2020 MEDICATIONS: None. COMPLICATIONS: None immediate. TECHNIQUE: Informed written  consent was obtained from the patient after a discussion of the risks, benefits and alternatives to treatment. A timeout was performed prior to the initiation of the procedure. Initial ultrasound scanning demonstrates a small left-sided anechoic pleural effusion. The lower chest was prepped and draped in the usual sterile fashion. 1% lidocaine was used for local anesthesia. An ultrasound image was saved for documentation purposes. An 8 Fr Safe-T-Centesis catheter was introduced. The thoracentesis was performed. The catheter was removed and a dressing was applied. The patient tolerated the procedure well without immediate post procedural complication. The patient was escorted to have an upright chest radiograph. FINDINGS: A total of approximately 200 cc of orange colored pleural fluid was removed. IMPRESSION: Successful ultrasound-guided left sided thoracentesis yielding 200 cc orange colored pleural fluid. Electronically Signed   By: Sandi Mariscal M.D.   On: 04/15/2020 16:18     DG Chest 2 View  Result Date: 04/14/2020 CLINICAL DATA:  Shortness of breath EXAM: CHEST - 2 VIEW COMPARISON:  04/08/2020 FINDINGS: Moderate to large left pleural effusion, slightly decreased since prior study. Left base atelectasis or infiltrate. Right lung clear. Heart is normal size. IMPRESSION: Moderate to large left effusion with left base atelectasis or infiltrate. Effusion slightly decreased since prior study. Electronically Signed   By: Rolm Baptise M.D.   On: 04/14/2020  14:39   DG Abd 1 View  Result Date: 04/14/2020 CLINICAL DATA:  Shortness of breath, abdominal swelling EXAM: ABDOMEN - 1 VIEW COMPARISON:  CT 04/06/2020 FINDINGS: Nonobstructive bowel gas pattern. No free air or suspicious calcification. Moderate stool burden throughout the colon. Prominent soft tissue density seen in the left upper abdomen corresponding to the previously seen large left upper quadrant mass involving the pancreas and spleen on prior CT. IMPRESSION: No evidence of bowel obstruction or free air. Prominent soft tissue density in the left upper quadrant corresponding to the large mass seen involving much of the pancreas and spleen on prior CT. Electronically Signed   By: Rolm Baptise M.D.   On: 04/14/2020 14:40   NM PET Image Initial (PI) Skull Base To Thigh  Result Date: 04/15/2020 CLINICAL DATA:  Initial treatment strategy for diffuse large B-cell lymphoma. EXAM: NUCLEAR MEDICINE PET SKULL BASE TO THIGH TECHNIQUE: 6.4 mCi F-18 FDG was injected intravenously. Full-ring PET imaging was performed from the skull base to thigh after the radiotracer. CT data was obtained and used for attenuation correction and anatomic localization. Fasting blood glucose: 101 mg/dl COMPARISON:  CT scan 04/06/2020 FINDINGS: Mediastinal blood pool activity: SUV max 1.84 Liver activity: SUV max 2.61 NECK: No hypermetabolic lymph nodes in the neck. Incidental CT findings: none CHEST: No hypermetabolic mediastinal or hilar nodes. No suspicious pulmonary nodules on the CT scan. No hypermetabolic breast lesions, supraclavicular or axillary adenopathy. Incidental CT findings: Persistent left pleural effusion and overlying left lower lobe atelectasis. ABDOMEN/PELVIS: Large left upper quadrant abdominal mass involving the spleen, stomach and retroperitoneum is markedly hypermetabolic with SUV max of 17.79. Associated contralateral retroperitoneal lymphadenopathy is hypermetabolic with SUV max of 39.03. No pelvic  lymphadenopathy. No inguinal lymphadenopathy. No worrisome hepatic lesions. Incidental CT findings: Markedly enlarged fibroid uterus is noted. Simple appearing cyst associated with the left ovary. Subcutaneous lesions involving the anterior abdominal wall. One is near the umbilicus. The other is overlying the mid pelvis. These have the appearance of some type of skin blister. Recommend clinical correlation. Mild hypermetabolism could suggest an inflammatory process. Given patient's age IV status Kaposi's sarcoma would be another possibility. SKELETON:  No findings suspicious for osseous metastatic disease. Incidental CT findings: none IMPRESSION: 1. Large left upper quadrant mass is markedly hypermetabolic and consistent with patient's known lymphoma. 2. No findings to suggest involvement of the neck, chest or pelvis or bony structures. 3. Enlarged fibroid uterus. 4. Midline anterior abdominal skin lesions. Mild hypermetabolism. These could reflect some type of skin blisters. I suppose Kaposi's sarcoma would be another consideration given the patient's HIV status. Electronically Signed   By: Marijo Sanes M.D.   On: 04/15/2020 14:20   ECHOCARDIOGRAM COMPLETE  Result Date: 04/18/2020    ECHOCARDIOGRAM REPORT   Patient Name:   JAZLIN TAPSCOTT Date of Exam: 04/18/2020 Medical Rec #:  [id number]       Height:       60.0 in Accession #:    [id number]      Weight:       127.0 lb Date of Birth:  1975/12/19        BSA:          1.539 m Patient Age:    56 years        BP:           142/98 mmHg Patient Gender: F               HR:           91 bpm. Exam Location:  Inpatient Procedure: 2D Echo, Cardiac Doppler, Color Doppler and Strain Analysis STAT ECHO Indications:    Chemotherapy evaluation v87.41 / v58.11  History:        Patient has no prior history of Echocardiogram examinations.                 Risk Factors:Non-Smoker. Pleural effusion.  Sonographer:    Vickie Epley RDCS Referring Phys: [place]   1. Left ventricular ejection fraction, by estimation, is 60 to 65%. The left ventricle has normal function. The left ventricle has no regional wall motion abnormalities. Left ventricular diastolic parameters were normal. The average left ventricular global longitudinal strain is -22.3 %.  2. Right ventricular systolic function is normal. The right ventricular size is normal. Tricuspid regurgitation signal is inadequate for assessing PA pressure.  3. The mitral valve is normal in structure. Trivial mitral valve regurgitation.  4. The aortic valve is tricuspid. Aortic valve regurgitation is not visualized. No aortic stenosis is present.  5. The inferior vena cava is normal in size with greater than 50% respiratory variability, suggesting right atrial pressure of 3 mmHg. FINDINGS  Left Ventricle: Left ventricular ejection fraction, by estimation, is 60 to 65%. The left ventricle has normal function. The left ventricle has no regional wall motion abnormalities. The average left ventricular global longitudinal strain is -22.3 %. The left ventricular internal cavity size was normal in size. There is no left ventricular hypertrophy. Left ventricular diastolic parameters were normal. Right Ventricle: The right ventricular size is normal. Right vetricular wall thickness was not assessed. Right ventricular systolic function is normal. Tricuspid regurgitation signal is inadequate for assessing PA pressure. Left Atrium: Left atrial size was normal in size. Right Atrium: Right atrial size was normal in size. Pericardium: Trivial pericardial effusion is present. Mitral Valve: The mitral valve is normal in structure. Trivial mitral valve regurgitation. Tricuspid Valve: The tricuspid valve is normal in structure. Tricuspid valve regurgitation is not demonstrated. Aortic Valve: The aortic valve is tricuspid. Aortic valve regurgitation is not visualized. No aortic stenosis is present. Pulmonic Valve: The pulmonic valve was  not well  visualized. Pulmonic valve regurgitation is not visualized. Aorta: The aortic root is normal in size and structure. Venous: The inferior vena cava is normal in size with greater than 50% respiratory variability, suggesting right atrial pressure of 3 mmHg. IAS/Shunts: The interatrial septum was not well visualized. Additional Comments: There is pleural effusion in the left lateral region.  LEFT VENTRICLE PLAX 2D LVIDd:         4.00 cm     Diastology LVIDs:         2.80 cm     LV e' lateral:   12.40 cm/s LV PW:         0.70 cm     LV E/e' lateral: 7.3 LV IVS:        0.70 cm     LV e' medial:    8.70 cm/s LVOT diam:     1.60 cm     LV E/e' medial:  10.4 LV SV:         36 LV SV Index:   23          2D Longitudinal Strain LVOT Area:     2.01 cm    2D Strain GLS Avg:     -22.3 %  LV Volumes (MOD) LV vol d, MOD A2C: 83.6 ml LV vol d, MOD A4C: 74.0 ml LV vol s, MOD A2C: 33.0 ml LV vol s, MOD A4C: 37.0 ml LV SV MOD A2C:     50.6 ml LV SV MOD A4C:     74.0 ml LV SV MOD BP:      43.6 ml RIGHT VENTRICLE RV S prime:     13.80 cm/s TAPSE (M-mode): 2.2 cm LEFT ATRIUM             Index       RIGHT ATRIUM          Index LA diam:        4.00 cm 2.60 cm/m  RA Area:     7.07 cm LA Vol (A2C):   17.7 ml 11.50 ml/m RA Volume:   9.87 ml  6.41 ml/m LA Vol (A4C):   26.1 ml 16.96 ml/m LA Biplane Vol: 23.4 ml 15.20 ml/m  AORTIC VALVE LVOT Vmax:   94.60 cm/s LVOT Vmean:  59.100 cm/s LVOT VTI:    0.177 m  AORTA Ao Root diam: 2.90 cm MITRAL VALVE MV Area (PHT): 4.80 cm    SHUNTS MV Decel Time: 158 msec    Systemic VTI:  0.18 m MV E velocity: 90.80 cm/s  Systemic Diam: 1.60 cm MV A velocity: 85.90 cm/s MV E/A ratio:  1.06 Oswaldo Milian MD Electronically signed by Oswaldo Milian MD Signature Date/Time: 04/18/2020/12:07:09 PM    Final    IR IMAGING GUIDED PORT INSERTION  Result Date: 04/15/2020 INDICATION: History of diffuse large B-cell lymphoma. In need of durable intravenous access for chemotherapy administration. EXAM:  IMPLANTED PORT A CATH PLACEMENT WITH ULTRASOUND AND FLUOROSCOPIC GUIDANCE COMPARISON:  Chest CT-04/07/2020 MEDICATIONS: Ancef 2 gm IV; The antibiotic was administered within an appropriate time interval prior to skin puncture. ANESTHESIA/SEDATION: Moderate (conscious) sedation was employed during this procedure. A total of Versed 4 mg and Fentanyl 100 mcg was administered intravenously. Moderate Sedation Time: 26 minutes. The patient's level of consciousness and vital signs were monitored continuously by radiology nursing throughout the procedure under my direct supervision. CONTRAST:  None FLUOROSCOPY TIME:  30 seconds (5 mGy) COMPLICATIONS: None immediate. PROCEDURE: The procedure, risks, benefits, and alternatives  were explained to the patient. Questions regarding the procedure were encouraged and answered. The patient understands and consents to the procedure. The right neck and chest were prepped with chlorhexidine in a sterile fashion, and a sterile drape was applied covering the operative field. Maximum barrier sterile technique with sterile gowns and gloves were used for the procedure. A timeout was performed prior to the initiation of the procedure. Local anesthesia was provided with 1% lidocaine with epinephrine. After creating a small venotomy incision, a micropuncture kit was utilized to access the internal jugular vein. Real-time ultrasound guidance was utilized for vascular access including the acquisition of a permanent ultrasound image documenting patency of the accessed vessel. The microwire was utilized to measure appropriate catheter length. A subcutaneous port pocket was then created along the upper chest wall utilizing a combination of sharp and blunt dissection. The pocket was irrigated with sterile saline. A single lumen "ISP" sized power injectable port was chosen for placement. The 8 Fr catheter was tunneled from the port pocket site to the venotomy incision. The port was placed in the  pocket. The external catheter was trimmed to appropriate length. At the venotomy, an 8 Fr peel-away sheath was placed over a guidewire under fluoroscopic guidance. The catheter was then placed through the sheath and the sheath was removed. Final catheter positioning was confirmed and documented with a fluoroscopic spot radiograph. The port was accessed with a Huber needle, aspirated and flushed with heparinized saline. The venotomy site was closed with an interrupted 4-0 Vicryl suture. The port pocket incision was closed with interrupted 2-0 Vicryl suture. The skin was opposed with a running subcuticular 4-0 Vicryl suture. Dermabond and Steri-strips were applied to both incisions. Dressings were applied. The patient tolerated the procedure well without immediate post procedural complication. FINDINGS: After catheter placement, the tip lies within the superior cavoatrial junction. The catheter aspirates and flushes normally and is ready for immediate use. IMPRESSION: Successful placement of a right internal jugular approach power injectable Port-A-Cath. The catheter is ready for immediate use. Electronically Signed   By: Sandi Mariscal M.D.   On: 04/15/2020 15:28   IR THORACENTESIS ASP PLEURAL SPACE W/IMG GUIDE  Result Date: 04/15/2020 INDICATION: Recent diagnosis diffuse large B-cell lymphoma, now with recurrent left-sided pleural effusion. Please perform ultrasound-guided thoracentesis for therapeutic purposes prior to PET-CT. EXAM: IR THORACENTESIS ASP PLEURAL SPACE W/IMG GUIDE COMPARISON:  Chest CT-04/07/2020 MEDICATIONS: None. COMPLICATIONS: None immediate. TECHNIQUE: Informed written consent was obtained from the patient after a discussion of the risks, benefits and alternatives to treatment. A timeout was performed prior to the initiation of the procedure. Initial ultrasound scanning demonstrates a small left-sided anechoic pleural effusion. The lower chest was prepped and draped in the usual sterile fashion.  1% lidocaine was used for local anesthesia. An ultrasound image was saved for documentation purposes. An 8 Fr Safe-T-Centesis catheter was introduced. The thoracentesis was performed. The catheter was removed and a dressing was applied. The patient tolerated the procedure well without immediate post procedural complication. The patient was escorted to have an upright chest radiograph. FINDINGS: A total of approximately 200 cc of orange colored pleural fluid was removed. IMPRESSION: Successful ultrasound-guided left sided thoracentesis yielding 200 cc orange colored pleural fluid. Electronically Signed   By: Sandi Mariscal M.D.   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 Hemoglobin C], childhood malaria. Patient notes that she had a CT of the abdomen sometime in 2020 that showed no acute pathology.This was not [facility]. Not accessible to [place] at this point. She did have an MRI of the abdomen in July 2019 which showed indeterminate splenic lesions.No other acute abdominal pathology.No concern with hepatocellular carcinoma.  Patient is presenting now with  #1Pancreatic massalong with retroperitoneal lymphadenopathy and diffuse splenic lesions. No internal necrosis within the mass noted. Diffuse splenomegaly. Left periaortic lymph node causing mild displacement of the third portion of the duodenum.  Significantly elevated LDH levels. 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  # 2 Left sided large pleural effusion and lung atelectasis. S/p diagnostic and therapeutic thoracentesis -- lymphocytic predominance @ 80%  HIV could be risk  factors for high grade EBV driven lymphomas  Patient symptomatology has developed rather quickly over the last few weeks.  #62mcrocytic anemia-chronic.Some element of iron deficiency versus hemoglobinopathy versus anemia of chronic disease related to malignancy. Patient has received 1 dose of IV Feraheme on 04/08/2020 and repeated on 04/22/2020  #4history of HIV/AIDS [facility]   PLAN: -Labs from 05/06/2020 have been reviewed and are adequate to proceed with day 1 of cycle #2 of her chemotherapy today as scheduled. -Check daily CBC with differential and CMET -We will stop allopurinol -As needed antiemetics have been ordered. -Begin MiraLAX 17 g mixed in water daily. -Will need to be cautious with rituximab to prevent recurrent hepatitis B flare.  Repeat HBV DNA undetectable on 04/13/2020. -Will coordinate care with her Infectious Disease Physician to prevent any drug interactions with Biktarvy -Will consider Intrathecal Methotrexate for CNS prophylaxis with future cycles   KMikey Bussing DNP, AGPCNP-BC, AOCNP  ADDENDUM  .Patient was Personally and independently interviewed, examined and relevant elements of the history of present illness were reviewed in details and an assessment and plan was created. All elements of the patient's history of present illness , assessment and plan were discussed in details with KMikey Bussing DNP, AGPCNP-BC, AOCNP. The above documentation reflects our combined findings assessment and plan. labs stable. No acute new toxicities. Will proceed with EPOCH-R C2 with same doses as C1  GSullivan LoneMD MS

## 2020-05-10 NOTE — Progress Notes (Signed)
Patient due for chemotherapy today.  Chemotherapy dosages, total VTBI, 2 pharmacy signatures, and patient identifiers independently verified by me and by Aldean Baker, RN.  Heather [name] [facility]  05/10/2020  4:27 PM

## 2020-05-11 LAB — COMPREHENSIVE METABOLIC PANEL
ALT: 24 U/L (ref 0–44)
AST: 19 U/L (ref 15–41)
Albumin: 3.4 g/dL — ABNORMAL LOW (ref 3.5–5.0)
Alkaline Phosphatase: 92 U/L (ref 38–126)
Anion gap: 9 (ref 5–15)
BUN: 10 mg/dL (ref 6–20)
CO2: 23 mmol/L (ref 22–32)
Calcium: 9.4 mg/dL (ref 8.9–10.3)
Chloride: 103 mmol/L (ref 98–111)
Creatinine, Ser: 0.55 mg/dL (ref 0.44–1.00)
GFR calc Af Amer: >60 mL/min (ref >60)
GFR calc non Af Amer: >60 mL/min (ref >60)
Glucose, Bld: 183 mg/dL — ABNORMAL HIGH (ref 70–99)
Potassium: 3.9 mmol/L (ref 3.5–5.1)
Sodium: 135 mmol/L (ref 135–145)
Total Bilirubin: 0.4 mg/dL (ref 0.3–1.2)
Total Protein: 6.4 g/dL — ABNORMAL LOW (ref 6.5–8.1)

## 2020-05-11 LAB — CBC WITH DIFFERENTIAL/PLATELET
Abs Immature Granulocytes: 0.4 10*3/uL — ABNORMAL HIGH (ref 0.00–0.07)
Basophils Absolute: 0 10*3/uL (ref 0.0–0.1)
Basophils Relative: 0 %
Eosinophils Absolute: 0 10*3/uL (ref 0.0–0.5)
Eosinophils Relative: 0 %
HCT: 34 % — ABNORMAL LOW (ref 36.0–46.0)
Hemoglobin: 10.3 g/dL — ABNORMAL LOW (ref 12.0–15.0)
Immature Granulocytes: 4 %
Lymphocytes Relative: 5 %
Lymphs Abs: 0.5 10*3/uL — ABNORMAL LOW (ref 0.7–4.0)
MCH: 23.5 pg — ABNORMAL LOW (ref 26.0–34.0)
MCHC: 30.3 g/dL (ref 30.0–36.0)
MCV: 77.4 fL — ABNORMAL LOW (ref 80.0–100.0)
Monocytes Absolute: 0.1 10*3/uL (ref 0.1–1.0)
Monocytes Relative: 1 %
Neutro Abs: 10 10*3/uL — ABNORMAL HIGH (ref 1.7–7.7)
Neutrophils Relative %: 90 %
Platelets: 333 10*3/uL (ref 150–400)
RBC: 4.39 MIL/uL (ref 3.87–5.11)
RDW: 29.1 % — ABNORMAL HIGH (ref 11.5–15.5)
WBC: 11 10*3/uL — ABNORMAL HIGH (ref 4.0–10.5)
nRBC: 0.4 % — ABNORMAL HIGH (ref 0.0–0.2)

## 2020-05-11 MED ORDER — VINCRISTINE SULFATE CHEMO INJECTION 1 MG/ML
Freq: Once | INTRAVENOUS | Status: AC
  Filled 2020-05-11: qty 8

## 2020-05-11 MED ORDER — SODIUM CHLORIDE 0.9 % IV SOLN
Freq: Once | INTRAVENOUS | Status: AC
  Administered 2020-05-11: 15 mg via INTRAVENOUS
  Filled 2020-05-11: qty 4

## 2020-05-12 ENCOUNTER — Inpatient Hospital Stay (HOSPITAL_COMMUNITY): Payer: 59

## 2020-05-12 LAB — CBC WITH DIFFERENTIAL/PLATELET
Abs Immature Granulocytes: 0.2 10*3/uL — ABNORMAL HIGH (ref 0.00–0.07)
Basophils Absolute: 0 10*3/uL (ref 0.0–0.1)
Basophils Relative: 0 %
Eosinophils Absolute: 0 10*3/uL (ref 0.0–0.5)
Eosinophils Relative: 0 %
HCT: 31.6 % — ABNORMAL LOW (ref 36.0–46.0)
Hemoglobin: 10 g/dL — ABNORMAL LOW (ref 12.0–15.0)
Immature Granulocytes: 2 %
Lymphocytes Relative: 2 %
Lymphs Abs: 0.3 10*3/uL — ABNORMAL LOW (ref 0.7–4.0)
MCH: 24.3 pg — ABNORMAL LOW (ref 26.0–34.0)
MCHC: 31.6 g/dL (ref 30.0–36.0)
MCV: 76.9 fL — ABNORMAL LOW (ref 80.0–100.0)
Monocytes Absolute: 0.5 10*3/uL (ref 0.1–1.0)
Monocytes Relative: 4 %
Neutro Abs: 12 10*3/uL — ABNORMAL HIGH (ref 1.7–7.7)
Neutrophils Relative %: 92 %
Platelets: 319 10*3/uL (ref 150–400)
RBC: 4.11 MIL/uL (ref 3.87–5.11)
RDW: 29.6 % — ABNORMAL HIGH (ref 11.5–15.5)
WBC: 13.1 10*3/uL — ABNORMAL HIGH (ref 4.0–10.5)
nRBC: 0.3 % — ABNORMAL HIGH (ref 0.0–0.2)

## 2020-05-12 LAB — COMPREHENSIVE METABOLIC PANEL
ALT: 20 U/L (ref 0–44)
AST: 15 U/L (ref 15–41)
Albumin: 3.6 g/dL (ref 3.5–5.0)
Alkaline Phosphatase: 83 U/L (ref 38–126)
Anion gap: 8 (ref 5–15)
BUN: 12 mg/dL (ref 6–20)
CO2: 25 mmol/L (ref 22–32)
Calcium: 9.6 mg/dL (ref 8.9–10.3)
Chloride: 106 mmol/L (ref 98–111)
Creatinine, Ser: 0.56 mg/dL (ref 0.44–1.00)
GFR calc Af Amer: >60 mL/min (ref >60)
GFR calc non Af Amer: >60 mL/min (ref >60)
Glucose, Bld: 160 mg/dL — ABNORMAL HIGH (ref 70–99)
Potassium: 3.8 mmol/L (ref 3.5–5.1)
Sodium: 139 mmol/L (ref 135–145)
Total Bilirubin: 0.6 mg/dL (ref 0.3–1.2)
Total Protein: 6.4 g/dL — ABNORMAL LOW (ref 6.5–8.1)

## 2020-05-12 MED ORDER — SODIUM CHLORIDE 0.9 % IV SOLN
Freq: Once | INTRAVENOUS | Status: AC
  Administered 2020-05-12: 18 mg via INTRAVENOUS
  Filled 2020-05-12: qty 4

## 2020-05-12 MED ORDER — HYDROCODONE-ACETAMINOPHEN 5-325 MG PO TABS: 1.0000 | ORAL_TABLET | Freq: Four times a day (QID) | ORAL | 0 refills | Status: DC | PRN

## 2020-05-12 MED ORDER — VINCRISTINE SULFATE CHEMO INJECTION 1 MG/ML
Freq: Once | INTRAVENOUS | Status: AC
  Filled 2020-05-12: qty 8

## 2020-05-12 NOTE — Progress Notes (Signed)
HEMATOLOGY-ONCOLOGY PROGRESS NOTE  SUBJECTIVE:   Patient is tolerating cycle 2-day 2 of EPOCH-R chemotherapy well.  Labs are stable.  No shortness of breath.  No nausea vomiting or diarrhea.  In good spirits overall.  Notes that her brother recently got married and is happy about this.  Constipation controlled with current laxatives.  No mouth soreness.  Has been using her salt and baking soda mouthwashes. Good appetite and p.o. intake.   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[place])  04/12/2020 Initial Diagnosis   Diffuse large B-cell lymphoma of lymph nodes of multiple regions ([place])   04/18/2020 -  Chemotherapy   The patient had dexamethasone (DECADRON) 4 MG tablet, 8 mg, Oral, 2 times daily with meals, 1 of 1 cycle, Start date: --, End date: -- DOXOrubicin (ADRIAMYCIN) 16 mg, etoposide (VEPESID) 80 mg, vinCRIStine (ONCOVIN) 0.6 mg in sodium chloride 0.9 % 1,000 mL chemo infusion, , Intravenous, Once, 2 of 5 cycles Administration:  (04/18/2020),  (04/19/2020),  (05/10/2020),  (05/11/2020),  (04/20/2020),  (04/21/2020) ondansetron (ZOFRAN) 8 mg, dexamethasone (DECADRON) 10 mg in sodium chloride 0.9 % 50 mL IVPB, , Intravenous,  Once, 2 of 5 cycles Administration: 8 mg (04/18/2020), 18 mg (04/19/2020), 36 mg (04/22/2020), 18 mg (05/10/2020), 15 mg (05/11/2020), 8 mg (04/20/2020), 18 mg (04/21/2020) cyclophosphamide (CYTOXAN) 1,180 mg in sodium chloride 0.9 % 250 mL chemo infusion, 750 mg/m2 = 1,180 mg, Intravenous,  Once, 2 of 5 cycles Administration: 1,180 mg (04/22/2020)  for chemotherapy treatment.    04/25/2020 -  Chemotherapy   The patient had pegfilgrastim-cbqv (UDENYCA) injection 6 mg, 6 mg, Subcutaneous, Once, 1 of 4 cycles Administration: 6 mg (04/25/2020) riTUXimab-pvvr (RUXIENCE) 600 mg in sodium chloride 0.9 % 250 mL (1.9355 mg/mL) infusion, 375 mg/m2 = 600 mg, Intravenous,  Once, 1 of 1 cycle Administration: 600 mg (04/25/2020)  for chemotherapy treatment.        REVIEW OF SYSTEMS:   10 Point review of Systems was done is negative except as noted above.   I have reviewed the past medical history, past surgical history, social history and family history with the patient and they are unchanged from previous note.   PHYSICAL EXAMINATION: ECOG PERFORMANCE STATUS: 1 - Symptomatic but completely ambulatory  Vitals:   05/11/20 2131 05/12/20 0623  BP: 139/86 133/78  Pulse: 81 71  Resp: 14 14  Temp: 98.2 F (36.8 C) 98.1 F (36.7 C)  SpO2: 99% 97%   Filed Weights   05/10/20 1622  Weight: 126 lb 1.6 oz (57.2 kg)    Intake/Output from previous day: 07/07 0701 - 07/08 0700 In: 825.9 [P.O.:240; I.V.:250.7; IV Piggyback:335.3] Out: -   GENERAL:alert, in no acute distress and comfortable SKIN: no acute rashes, no significant lesions EYES: conjunctiva are pink and non-injected, sclera anicteric OROPHARYNX: MMM, no exudates, no oropharyngeal erythema or ulceration NECK: supple, no JVD LYMPH:  no palpable lymphadenopathy in the cervical, axillary or inguinal regions LUNGS: decreased air entry left lung base. HEART: regular rate & rhythm ABDOMEN:  normoactive bowel sounds , non tender, not distended. Extremity: no pedal edema PSYCH: alert & oriented x 3 with fluent speech NEURO: no focal motor/sensory deficits   LABORATORY DATA:  I have reviewed the data as listed CMP Latest Ref Rng & Units 05/12/2020 05/11/2020 05/10/2020  Glucose 70 - 99 mg/dL 160(H) 183(H) -  BUN 6 - 20 mg/dL 12 10 -  Creatinine 0.44 - 1.00 mg/dL 0.56 0.55 0.62  Sodium 135 -  145 mmol/L 139 135 -  Potassium 3.5 - 5.1 mmol/L 3.8 3.9 -  Chloride 98 - 111 mmol/L 106 103 -  CO2 22 - 32 mmol/L 25 23 -  Calcium 8.9 - 10.3 mg/dL 9.6 9.4 -  Total Protein 6.5 - 8.1 g/dL 6.4(L) 6.4(L) -  Total Bilirubin 0.3 - 1.2 mg/dL 0.6 0.4 -  Alkaline Phos 38 - 126 U/L 83 92 -  AST 15 - 41 U/L 15 19 -  ALT 0 - 44 U/L 20 24 -    Lab Results  Component Value Date   WBC 13.1 (H)  05/12/2020   HGB 10.0 (L) 05/12/2020   HCT 31.6 (L) 05/12/2020   MCV 76.9 (L) 05/12/2020   PLT 319 05/12/2020   NEUTROABS 12.0 (H) 05/12/2020    DG Chest 2 View  Result Date: 04/14/2020 CLINICAL DATA:  Shortness of breath EXAM: CHEST - 2 VIEW COMPARISON:  04/08/2020 FINDINGS: Moderate to large left pleural effusion, slightly decreased since prior study. Left base atelectasis or infiltrate. Right lung clear. Heart is normal size. IMPRESSION: Moderate to large left effusion with left base atelectasis or infiltrate. Effusion slightly decreased since prior study. Electronically Signed   By: Rolm Baptise M.D.   On: 04/14/2020 14:39   DG Abd 1 View  Result Date: 04/14/2020 CLINICAL DATA:  Shortness of breath, abdominal swelling EXAM: ABDOMEN - 1 VIEW COMPARISON:  CT 04/06/2020 FINDINGS: Nonobstructive bowel gas pattern. No free air or suspicious calcification. Moderate stool burden throughout the colon. Prominent soft tissue density seen in the left upper abdomen corresponding to the previously seen large left upper quadrant mass involving the pancreas and spleen on prior CT. IMPRESSION: No evidence of bowel obstruction or free air. Prominent soft tissue density in the left upper quadrant corresponding to the large mass seen involving much of the pancreas and spleen on prior CT. Electronically Signed   By: Rolm Baptise M.D.   On: 04/14/2020 14:40   NM PET Image Initial (PI) Skull Base To Thigh  Result Date: 04/15/2020 CLINICAL DATA:  Initial treatment strategy for diffuse large B-cell lymphoma. EXAM: NUCLEAR MEDICINE PET SKULL BASE TO THIGH TECHNIQUE: 6.4 mCi F-18 FDG was injected intravenously. Full-ring PET imaging was performed from the skull base to thigh after the radiotracer. CT data was obtained and used for attenuation correction and anatomic localization. Fasting blood glucose: 101 mg/dl COMPARISON:  CT scan 04/06/2020 FINDINGS: Mediastinal blood pool activity: SUV max 1.84 Liver activity: SUV  max 2.61 NECK: No hypermetabolic lymph nodes in the neck. Incidental CT findings: none CHEST: No hypermetabolic mediastinal or hilar nodes. No suspicious pulmonary nodules on the CT scan. No hypermetabolic breast lesions, supraclavicular or axillary adenopathy. Incidental CT findings: Persistent left pleural effusion and overlying left lower lobe atelectasis. ABDOMEN/PELVIS: Large left upper quadrant abdominal mass involving the spleen, stomach and retroperitoneum is markedly hypermetabolic with SUV max of 77.93. Associated contralateral retroperitoneal lymphadenopathy is hypermetabolic with SUV max of 90.30. No pelvic lymphadenopathy. No inguinal lymphadenopathy. No worrisome hepatic lesions. Incidental CT findings: Markedly enlarged fibroid uterus is noted. Simple appearing cyst associated with the left ovary. Subcutaneous lesions involving the anterior abdominal wall. One is near the umbilicus. The other is overlying the mid pelvis. These have the appearance of some type of skin blister. Recommend clinical correlation. Mild hypermetabolism could suggest an inflammatory process. Given patient's age IV status Kaposi's sarcoma would be another possibility. SKELETON: No findings suspicious for osseous metastatic disease. Incidental CT findings: none IMPRESSION: 1. Large left  upper quadrant mass is markedly hypermetabolic and consistent with patient's known lymphoma. 2. No findings to suggest involvement of the neck, chest or pelvis or bony structures. 3. Enlarged fibroid uterus. 4. Midline anterior abdominal skin lesions. Mild hypermetabolism. These could reflect some type of skin blisters. I suppose Kaposi's sarcoma would be another consideration given the patient's HIV status. Electronically Signed   By: Marijo Sanes M.D.   On: 04/15/2020 14:20   ECHOCARDIOGRAM COMPLETE  Result Date: 04/18/2020    ECHOCARDIOGRAM REPORT   Patient Name:   COLINDA BARTH Date of Exam: 04/18/2020 Medical Rec #:  [id number]        Height:       60.0 in Accession #:    [id number]      Weight:       127.0 lb Date of Birth:  August 20, 1976        BSA:          1.539 m Patient Age:    38 years        BP:           142/98 mmHg Patient Gender: F               HR:           91 bpm. Exam Location:  Inpatient Procedure: 2D Echo, Cardiac Doppler, Color Doppler and Strain Analysis STAT ECHO Indications:    Chemotherapy evaluation v87.41 / v58.11  History:        Patient has no prior history of Echocardiogram examinations.                 Risk Factors:Non-Smoker. Pleural effusion.  Sonographer:    Vickie Epley RDCS Referring Phys: [place]  1. Left ventricular ejection fraction, by estimation, is 60 to 65%. The left ventricle has normal function. The left ventricle has no regional wall motion abnormalities. Left ventricular diastolic parameters were normal. The average left ventricular global longitudinal strain is -22.3 %.  2. Right ventricular systolic function is normal. The right ventricular size is normal. Tricuspid regurgitation signal is inadequate for assessing PA pressure.  3. The mitral valve is normal in structure. Trivial mitral valve regurgitation.  4. The aortic valve is tricuspid. Aortic valve regurgitation is not visualized. No aortic stenosis is present.  5. The inferior vena cava is normal in size with greater than 50% respiratory variability, suggesting right atrial pressure of 3 mmHg. FINDINGS  Left Ventricle: Left ventricular ejection fraction, by estimation, is 60 to 65%. The left ventricle has normal function. The left ventricle has no regional wall motion abnormalities. The average left ventricular global longitudinal strain is -22.3 %. The left ventricular internal cavity size was normal in size. There is no left ventricular hypertrophy. Left ventricular diastolic parameters were normal. Right Ventricle: The right ventricular size is normal. Right vetricular wall thickness was not assessed. Right ventricular  systolic function is normal. Tricuspid regurgitation signal is inadequate for assessing PA pressure. Left Atrium: Left atrial size was normal in size. Right Atrium: Right atrial size was normal in size. Pericardium: Trivial pericardial effusion is present. Mitral Valve: The mitral valve is normal in structure. Trivial mitral valve regurgitation. Tricuspid Valve: The tricuspid valve is normal in structure. Tricuspid valve regurgitation is not demonstrated. Aortic Valve: The aortic valve is tricuspid. Aortic valve regurgitation is not visualized. No aortic stenosis is present. Pulmonic Valve: The pulmonic valve was not well visualized. Pulmonic valve regurgitation is not visualized. Aorta: The aortic root is  normal in size and structure. Venous: The inferior vena cava is normal in size with greater than 50% respiratory variability, suggesting right atrial pressure of 3 mmHg. IAS/Shunts: The interatrial septum was not well visualized. Additional Comments: There is pleural effusion in the left lateral region.  LEFT VENTRICLE PLAX 2D LVIDd:         4.00 cm     Diastology LVIDs:         2.80 cm     LV e' lateral:   12.40 cm/s LV PW:         0.70 cm     LV E/e' lateral: 7.3 LV IVS:        0.70 cm     LV e' medial:    8.70 cm/s LVOT diam:     1.60 cm     LV E/e' medial:  10.4 LV SV:         36 LV SV Index:   23          2D Longitudinal Strain LVOT Area:     2.01 cm    2D Strain GLS Avg:     -22.3 %  LV Volumes (MOD) LV vol d, MOD A2C: 83.6 ml LV vol d, MOD A4C: 74.0 ml LV vol s, MOD A2C: 33.0 ml LV vol s, MOD A4C: 37.0 ml LV SV MOD A2C:     50.6 ml LV SV MOD A4C:     74.0 ml LV SV MOD BP:      43.6 ml RIGHT VENTRICLE RV S prime:     13.80 cm/s TAPSE (M-mode): 2.2 cm LEFT ATRIUM             Index       RIGHT ATRIUM          Index LA diam:        4.00 cm 2.60 cm/m  RA Area:     7.07 cm LA Vol (A2C):   17.7 ml 11.50 ml/m RA Volume:   9.87 ml  6.41 ml/m LA Vol (A4C):   26.1 ml 16.96 ml/m LA Biplane Vol: 23.4 ml 15.20  ml/m  AORTIC VALVE LVOT Vmax:   94.60 cm/s LVOT Vmean:  59.100 cm/s LVOT VTI:    0.177 m  AORTA Ao Root diam: 2.90 cm MITRAL VALVE MV Area (PHT): 4.80 cm    SHUNTS MV Decel Time: 158 msec    Systemic VTI:  0.18 m MV E velocity: 90.80 cm/s  Systemic Diam: 1.60 cm MV A velocity: 85.90 cm/s MV E/A ratio:  1.06 Oswaldo Milian MD Electronically signed by Oswaldo Milian MD Signature Date/Time: 04/18/2020/12:07:09 PM    Final    IR IMAGING GUIDED PORT INSERTION  Result Date: 04/15/2020 INDICATION: History of diffuse large B-cell lymphoma. In need of durable intravenous access for chemotherapy administration. EXAM: IMPLANTED PORT A CATH PLACEMENT WITH ULTRASOUND AND FLUOROSCOPIC GUIDANCE COMPARISON:  Chest CT-04/07/2020 MEDICATIONS: Ancef 2 gm IV; The antibiotic was administered within an appropriate time interval prior to skin puncture. ANESTHESIA/SEDATION: Moderate (conscious) sedation was employed during this procedure. A total of Versed 4 mg and Fentanyl 100 mcg was administered intravenously. Moderate Sedation Time: 26 minutes. The patient's level of consciousness and vital signs were monitored continuously by radiology nursing throughout the procedure under my direct supervision. CONTRAST:  None FLUOROSCOPY TIME:  30 seconds (5 mGy) COMPLICATIONS: None immediate. PROCEDURE: The procedure, risks, benefits, and alternatives were explained to the patient. Questions regarding the procedure were encouraged and answered. The patient  understands and consents to the procedure. The right neck and chest were prepped with chlorhexidine in a sterile fashion, and a sterile drape was applied covering the operative field. Maximum barrier sterile technique with sterile gowns and gloves were used for the procedure. A timeout was performed prior to the initiation of the procedure. Local anesthesia was provided with 1% lidocaine with epinephrine. After creating a small venotomy incision, a micropuncture kit was utilized  to access the internal jugular vein. Real-time ultrasound guidance was utilized for vascular access including the acquisition of a permanent ultrasound image documenting patency of the accessed vessel. The microwire was utilized to measure appropriate catheter length. A subcutaneous port pocket was then created along the upper chest wall utilizing a combination of sharp and blunt dissection. The pocket was irrigated with sterile saline. A single lumen "ISP" sized power injectable port was chosen for placement. The 8 Fr catheter was tunneled from the port pocket site to the venotomy incision. The port was placed in the pocket. The external catheter was trimmed to appropriate length. At the venotomy, an 8 Fr peel-away sheath was placed over a guidewire under fluoroscopic guidance. The catheter was then placed through the sheath and the sheath was removed. Final catheter positioning was confirmed and documented with a fluoroscopic spot radiograph. The port was accessed with a Huber needle, aspirated and flushed with heparinized saline. The venotomy site was closed with an interrupted 4-0 Vicryl suture. The port pocket incision was closed with interrupted 2-0 Vicryl suture. The skin was opposed with a running subcuticular 4-0 Vicryl suture. Dermabond and Steri-strips were applied to both incisions. Dressings were applied. The patient tolerated the procedure well without immediate post procedural complication. FINDINGS: After catheter placement, the tip lies within the superior cavoatrial junction. The catheter aspirates and flushes normally and is ready for immediate use. IMPRESSION: Successful placement of a right internal jugular approach power injectable Port-A-Cath. The catheter is ready for immediate use. Electronically Signed   By: Sandi Mariscal M.D.   On: 04/15/2020 15:28   IR THORACENTESIS ASP PLEURAL SPACE W/IMG GUIDE  Result Date: 04/15/2020 INDICATION: Recent diagnosis diffuse large B-cell lymphoma, now  with recurrent left-sided pleural effusion. Please perform ultrasound-guided thoracentesis for therapeutic purposes prior to PET-CT. EXAM: IR THORACENTESIS ASP PLEURAL SPACE W/IMG GUIDE COMPARISON:  Chest CT-04/07/2020 MEDICATIONS: None. COMPLICATIONS: None immediate. TECHNIQUE: Informed written consent was obtained from the patient after a discussion of the risks, benefits and alternatives to treatment. A timeout was performed prior to the initiation of the procedure. Initial ultrasound scanning demonstrates a small left-sided anechoic pleural effusion. The lower chest was prepped and draped in the usual sterile fashion. 1% lidocaine was used for local anesthesia. An ultrasound image was saved for documentation purposes. An 8 Fr Safe-T-Centesis catheter was introduced. The thoracentesis was performed. The catheter was removed and a dressing was applied. The patient tolerated the procedure well without immediate post procedural complication. The patient was escorted to have an upright chest radiograph. FINDINGS: A total of approximately 200 cc of orange colored pleural fluid was removed. IMPRESSION: Successful ultrasound-guided left sided thoracentesis yielding 200 cc orange colored pleural fluid. Electronically Signed   By: Sandi Mariscal M.D.   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  Hemoglobin C], childhood malaria. Patient notes that she had a CT of the abdomen sometime in 2020 that showed no acute pathology.This was not [facility]. Not accessible to [place] at this point. She did have an MRI of the abdomen in July 2019 which showed indeterminate splenic lesions.No other acute abdominal pathology.No concern with  hepatocellular carcinoma.  Patient is presenting now with  #1Pancreatic massalong with retroperitoneal lymphadenopathy and diffuse splenic lesions. No internal necrosis within the mass noted. Diffuse splenomegaly. Left periaortic lymph node causing mild displacement of the third portion of the duodenum.  Significantly elevated LDH levels. 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  # 2 Left sided large pleural effusion and lung atelectasis. S/p diagnostic and therapeutic thoracentesis -- lymphocytic predominance @ 80%  HIV could be risk factors for high grade EBV driven lymphomas  Patient symptomatology has developed rather quickly over the last few weeks.  #24mcrocytic anemia-chronic.Some element of iron deficiency versus hemoglobinopathy versus anemia of chronic disease related to malignancy. Patient has received 1 dose of IV Feraheme on 04/08/2020 and repeated on 04/22/2020  #4history of HIV/AIDS [facility]   PLAN: -Labs are stable and patient has no symptoms of prohibitive toxicities from her chemotherapy and is stable to get cycle 2-day 2 of EPOCH-R -Continue as needed antiemetics. -Continue MiraLAX daily -Will need to be cautious with rituximab to prevent recurrent hepatitis B flare.  Repeat HBV DNA undetectable on 04/13/2020. -continue Biktarvy for HIV and Hep B control -Will consider Intrathecal Methotrexate for CNS prophylaxis with future cycles  -Outpatient Rituxan, Neulasta on 05/16/2020 -outpatient labs and f/u with Dr KIrene Limboon 05/23/2020 -We will repeat chest x-ray tomorrow to evaluate for resolution of pleural effusion  GSullivan LoneMD MS Hematology/Oncology Physician [facility]

## 2020-05-12 NOTE — Progress Notes (Addendum)
HEMATOLOGY-ONCOLOGY PROGRESS NOTE  SUBJECTIVE:   Tolerating chemotherapy well overall.  Reports mild nausea but still able to eat without difficulty.  Denies mucositis, abdominal pain, vomiting.  Constipation well controlled with MiraLAX.  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[place])   04/18/2020 -  Chemotherapy   The patient had dexamethasone (DECADRON) 4 MG tablet, 8 mg, Oral, 2 times daily with meals, 1 of 1 cycle, Start date: --, End date: -- DOXOrubicin (ADRIAMYCIN) 16 mg, etoposide (VEPESID) 80 mg, vinCRIStine (ONCOVIN) 0.6 mg in sodium chloride 0.9 % 1,000 mL chemo infusion, , Intravenous, Once, 2 of 5 cycles Administration:  (04/18/2020),  (04/19/2020),  (05/10/2020),  (05/11/2020),  (04/20/2020),  (04/21/2020) ondansetron (ZOFRAN) 8 mg, dexamethasone (DECADRON) 10 mg in sodium chloride 0.9 % 50 mL IVPB, , Intravenous,  Once, 2 of 5 cycles Administration: 8 mg (04/18/2020), 18 mg (04/19/2020), 36 mg (04/22/2020), 18 mg (05/10/2020), 15 mg (05/11/2020), 8 mg (04/20/2020), 18 mg (04/21/2020) cyclophosphamide (CYTOXAN) 1,180 mg in sodium chloride 0.9 % 250 mL chemo infusion, 750 mg/m2 = 1,180 mg, Intravenous,  Once, 2 of 5 cycles Administration: 1,180 mg (04/22/2020)  for chemotherapy treatment.    04/25/2020 -  Chemotherapy   The patient had pegfilgrastim-cbqv (UDENYCA) injection 6 mg, 6 mg, Subcutaneous, Once, 1 of 4 cycles Administration: 6 mg (04/25/2020) riTUXimab-pvvr (RUXIENCE) 600 mg in sodium chloride 0.9 % 250 mL (1.9355 mg/mL) infusion, 375 mg/m2 = 600 mg, Intravenous,  Once, 1 of 1 cycle Administration: 600 mg (04/25/2020)  for chemotherapy treatment.       REVIEW OF SYSTEMS:   10 Point review of Systems was done is negative except as noted above.   I have reviewed the past medical history, past surgical history, social history and family  history with the patient and they are unchanged from previous note.   PHYSICAL EXAMINATION: ECOG PERFORMANCE STATUS: 1 - Symptomatic but completely ambulatory  Vitals:   05/11/20 2131 05/12/20 0623  BP: 139/86 133/78  Pulse: 81 71  Resp: 14 14  Temp: 98.2 F (36.8 C) 98.1 F (36.7 C)  SpO2: 99% 97%   Filed Weights   05/10/20 1622  Weight: 57.2 kg    Intake/Output from previous day: 07/07 0701 - 07/08 0700 In: 825.9 [P.O.:240; I.V.:250.7; IV Piggyback:335.3] Out: -   GENERAL:alert, in no acute distress and comfortable SKIN: no acute rashes, no significant lesions EYES: conjunctiva are pink and non-injected, sclera anicteric OROPHARYNX: MMM, no exudates, no oropharyngeal erythema or ulceration NECK: supple, no JVD LYMPH:  no palpable lymphadenopathy in the cervical, axillary or inguinal regions LUNGS: decreased air entry left lung base. HEART: regular rate & rhythm ABDOMEN:  normoactive bowel sounds , non tender, not distended. Extremity: no pedal edema PSYCH: alert & oriented x 3 with fluent speech NEURO: no focal motor/sensory deficits   LABORATORY DATA:  I have reviewed the data as listed CMP Latest Ref Rng & Units 05/12/2020 05/11/2020 05/10/2020  Glucose 70 - 99 mg/dL 160(H) 183(H) -  BUN 6 - 20 mg/dL 12 10 -  Creatinine 0.44 - 1.00 mg/dL 0.56 0.55 0.62  Sodium 135 - 145 mmol/L 139 135 -  Potassium 3.5 - 5.1 mmol/L 3.8 3.9 -  Chloride 98 - 111 mmol/L 106 103 -  CO2 22 - 32 mmol/L 25 23 -  Calcium 8.9 - 10.3 mg/dL 9.6 9.4 -  Total Protein 6.5 -  8.1 g/dL 6.4(L) 6.4(L) -  Total Bilirubin 0.3 - 1.2 mg/dL 0.6 0.4 -  Alkaline Phos 38 - 126 U/L 83 92 -  AST 15 - 41 U/L 15 19 -  ALT 0 - 44 U/L 20 24 -    Lab Results  Component Value Date   WBC 13.1 (H) 05/12/2020   HGB 10.0 (L) 05/12/2020   HCT 31.6 (L) 05/12/2020   MCV 76.9 (L) 05/12/2020   PLT 319 05/12/2020   NEUTROABS 12.0 (H) 05/12/2020    DG Chest 2 View  Result Date: 04/14/2020 CLINICAL DATA:   Shortness of breath EXAM: CHEST - 2 VIEW COMPARISON:  04/08/2020 FINDINGS: Moderate to large left pleural effusion, slightly decreased since prior study. Left base atelectasis or infiltrate. Right lung clear. Heart is normal size. IMPRESSION: Moderate to large left effusion with left base atelectasis or infiltrate. Effusion slightly decreased since prior study. Electronically Signed   By: Rolm Baptise M.D.   On: 04/14/2020 14:39   DG Abd 1 View  Result Date: 04/14/2020 CLINICAL DATA:  Shortness of breath, abdominal swelling EXAM: ABDOMEN - 1 VIEW COMPARISON:  CT 04/06/2020 FINDINGS: Nonobstructive bowel gas pattern. No free air or suspicious calcification. Moderate stool burden throughout the colon. Prominent soft tissue density seen in the left upper abdomen corresponding to the previously seen large left upper quadrant mass involving the pancreas and spleen on prior CT. IMPRESSION: No evidence of bowel obstruction or free air. Prominent soft tissue density in the left upper quadrant corresponding to the large mass seen involving much of the pancreas and spleen on prior CT. Electronically Signed   By: Rolm Baptise M.D.   On: 04/14/2020 14:40   NM PET Image Initial (PI) Skull Base To Thigh  Result Date: 04/15/2020 CLINICAL DATA:  Initial treatment strategy for diffuse large B-cell lymphoma. EXAM: NUCLEAR MEDICINE PET SKULL BASE TO THIGH TECHNIQUE: 6.4 mCi F-18 FDG was injected intravenously. Full-ring PET imaging was performed from the skull base to thigh after the radiotracer. CT data was obtained and used for attenuation correction and anatomic localization. Fasting blood glucose: 101 mg/dl COMPARISON:  CT scan 04/06/2020 FINDINGS: Mediastinal blood pool activity: SUV max 1.84 Liver activity: SUV max 2.61 NECK: No hypermetabolic lymph nodes in the neck. Incidental CT findings: none CHEST: No hypermetabolic mediastinal or hilar nodes. No suspicious pulmonary nodules on the CT scan. No hypermetabolic breast  lesions, supraclavicular or axillary adenopathy. Incidental CT findings: Persistent left pleural effusion and overlying left lower lobe atelectasis. ABDOMEN/PELVIS: Large left upper quadrant abdominal mass involving the spleen, stomach and retroperitoneum is markedly hypermetabolic with SUV max of 24.23. Associated contralateral retroperitoneal lymphadenopathy is hypermetabolic with SUV max of 53.61. No pelvic lymphadenopathy. No inguinal lymphadenopathy. No worrisome hepatic lesions. Incidental CT findings: Markedly enlarged fibroid uterus is noted. Simple appearing cyst associated with the left ovary. Subcutaneous lesions involving the anterior abdominal wall. One is near the umbilicus. The other is overlying the mid pelvis. These have the appearance of some type of skin blister. Recommend clinical correlation. Mild hypermetabolism could suggest an inflammatory process. Given patient's age IV status Kaposi's sarcoma would be another possibility. SKELETON: No findings suspicious for osseous metastatic disease. Incidental CT findings: none IMPRESSION: 1. Large left upper quadrant mass is markedly hypermetabolic and consistent with patient's known lymphoma. 2. No findings to suggest involvement of the neck, chest or pelvis or bony structures. 3. Enlarged fibroid uterus. 4. Midline anterior abdominal skin lesions. Mild hypermetabolism. These could reflect some type of skin  blisters. I suppose Kaposi's sarcoma would be another consideration given the patient's HIV status. Electronically Signed   By: Marijo Sanes M.D.   On: 04/15/2020 14:20   ECHOCARDIOGRAM COMPLETE  Result Date: 04/18/2020    ECHOCARDIOGRAM REPORT   Patient Name:   LEEANDRA ELLERSON Date of Exam: 04/18/2020 Medical Rec #:  [id number]       Height:       60.0 in Accession #:    [id number]      Weight:       127.0 lb Date of Birth:  04-Dec-1975        BSA:          1.539 m Patient Age:    30 years        BP:           142/98 mmHg Patient Gender: F                HR:           91 bpm. Exam Location:  Inpatient Procedure: 2D Echo, Cardiac Doppler, Color Doppler and Strain Analysis STAT ECHO Indications:    Chemotherapy evaluation v87.41 / v58.11  History:        Patient has no prior history of Echocardiogram examinations.                 Risk Factors:Non-Smoker. Pleural effusion.  Sonographer:    Vickie Epley RDCS Referring Phys: [place]  1. Left ventricular ejection fraction, by estimation, is 60 to 65%. The left ventricle has normal function. The left ventricle has no regional wall motion abnormalities. Left ventricular diastolic parameters were normal. The average left ventricular global longitudinal strain is -22.3 %.  2. Right ventricular systolic function is normal. The right ventricular size is normal. Tricuspid regurgitation signal is inadequate for assessing PA pressure.  3. The mitral valve is normal in structure. Trivial mitral valve regurgitation.  4. The aortic valve is tricuspid. Aortic valve regurgitation is not visualized. No aortic stenosis is present.  5. The inferior vena cava is normal in size with greater than 50% respiratory variability, suggesting right atrial pressure of 3 mmHg. FINDINGS  Left Ventricle: Left ventricular ejection fraction, by estimation, is 60 to 65%. The left ventricle has normal function. The left ventricle has no regional wall motion abnormalities. The average left ventricular global longitudinal strain is -22.3 %. The left ventricular internal cavity size was normal in size. There is no left ventricular hypertrophy. Left ventricular diastolic parameters were normal. Right Ventricle: The right ventricular size is normal. Right vetricular wall thickness was not assessed. Right ventricular systolic function is normal. Tricuspid regurgitation signal is inadequate for assessing PA pressure. Left Atrium: Left atrial size was normal in size. Right Atrium: Right atrial size was normal in size. Pericardium:  Trivial pericardial effusion is present. Mitral Valve: The mitral valve is normal in structure. Trivial mitral valve regurgitation. Tricuspid Valve: The tricuspid valve is normal in structure. Tricuspid valve regurgitation is not demonstrated. Aortic Valve: The aortic valve is tricuspid. Aortic valve regurgitation is not visualized. No aortic stenosis is present. Pulmonic Valve: The pulmonic valve was not well visualized. Pulmonic valve regurgitation is not visualized. Aorta: The aortic root is normal in size and structure. Venous: The inferior vena cava is normal in size with greater than 50% respiratory variability, suggesting right atrial pressure of 3 mmHg. IAS/Shunts: The interatrial septum was not well visualized. Additional Comments: There is pleural effusion in the left lateral region.  LEFT VENTRICLE PLAX 2D LVIDd:         4.00 cm     Diastology LVIDs:         2.80 cm     LV e' lateral:   12.40 cm/s LV PW:         0.70 cm     LV E/e' lateral: 7.3 LV IVS:        0.70 cm     LV e' medial:    8.70 cm/s LVOT diam:     1.60 cm     LV E/e' medial:  10.4 LV SV:         36 LV SV Index:   23          2D Longitudinal Strain LVOT Area:     2.01 cm    2D Strain GLS Avg:     -22.3 %  LV Volumes (MOD) LV vol d, MOD A2C: 83.6 ml LV vol d, MOD A4C: 74.0 ml LV vol s, MOD A2C: 33.0 ml LV vol s, MOD A4C: 37.0 ml LV SV MOD A2C:     50.6 ml LV SV MOD A4C:     74.0 ml LV SV MOD BP:      43.6 ml RIGHT VENTRICLE RV S prime:     13.80 cm/s TAPSE (M-mode): 2.2 cm LEFT ATRIUM             Index       RIGHT ATRIUM          Index LA diam:        4.00 cm 2.60 cm/m  RA Area:     7.07 cm LA Vol (A2C):   17.7 ml 11.50 ml/m RA Volume:   9.87 ml  6.41 ml/m LA Vol (A4C):   26.1 ml 16.96 ml/m LA Biplane Vol: 23.4 ml 15.20 ml/m  AORTIC VALVE LVOT Vmax:   94.60 cm/s LVOT Vmean:  59.100 cm/s LVOT VTI:    0.177 m  AORTA Ao Root diam: 2.90 cm MITRAL VALVE MV Area (PHT): 4.80 cm    SHUNTS MV Decel Time: 158 msec    Systemic VTI:  0.18 m MV E  velocity: 90.80 cm/s  Systemic Diam: 1.60 cm MV A velocity: 85.90 cm/s MV E/A ratio:  1.06 Oswaldo Milian MD Electronically signed by Oswaldo Milian MD Signature Date/Time: 04/18/2020/12:07:09 PM    Final    IR IMAGING GUIDED PORT INSERTION  Result Date: 04/15/2020 INDICATION: History of diffuse large B-cell lymphoma. In need of durable intravenous access for chemotherapy administration. EXAM: IMPLANTED PORT A CATH PLACEMENT WITH ULTRASOUND AND FLUOROSCOPIC GUIDANCE COMPARISON:  Chest CT-04/07/2020 MEDICATIONS: Ancef 2 gm IV; The antibiotic was administered within an appropriate time interval prior to skin puncture. ANESTHESIA/SEDATION: Moderate (conscious) sedation was employed during this procedure. A total of Versed 4 mg and Fentanyl 100 mcg was administered intravenously. Moderate Sedation Time: 26 minutes. The patient's level of consciousness and vital signs were monitored continuously by radiology nursing throughout the procedure under my direct supervision. CONTRAST:  None FLUOROSCOPY TIME:  30 seconds (5 mGy) COMPLICATIONS: None immediate. PROCEDURE: The procedure, risks, benefits, and alternatives were explained to the patient. Questions regarding the procedure were encouraged and answered. The patient understands and consents to the procedure. The right neck and chest were prepped with chlorhexidine in a sterile fashion, and a sterile drape was applied covering the operative field. Maximum barrier sterile technique with sterile gowns and gloves were used for the procedure. A timeout was performed  prior to the initiation of the procedure. Local anesthesia was provided with 1% lidocaine with epinephrine. After creating a small venotomy incision, a micropuncture kit was utilized to access the internal jugular vein. Real-time ultrasound guidance was utilized for vascular access including the acquisition of a permanent ultrasound image documenting patency of the accessed vessel. The microwire was  utilized to measure appropriate catheter length. A subcutaneous port pocket was then created along the upper chest wall utilizing a combination of sharp and blunt dissection. The pocket was irrigated with sterile saline. A single lumen "ISP" sized power injectable port was chosen for placement. The 8 Fr catheter was tunneled from the port pocket site to the venotomy incision. The port was placed in the pocket. The external catheter was trimmed to appropriate length. At the venotomy, an 8 Fr peel-away sheath was placed over a guidewire under fluoroscopic guidance. The catheter was then placed through the sheath and the sheath was removed. Final catheter positioning was confirmed and documented with a fluoroscopic spot radiograph. The port was accessed with a Huber needle, aspirated and flushed with heparinized saline. The venotomy site was closed with an interrupted 4-0 Vicryl suture. The port pocket incision was closed with interrupted 2-0 Vicryl suture. The skin was opposed with a running subcuticular 4-0 Vicryl suture. Dermabond and Steri-strips were applied to both incisions. Dressings were applied. The patient tolerated the procedure well without immediate post procedural complication. FINDINGS: After catheter placement, the tip lies within the superior cavoatrial junction. The catheter aspirates and flushes normally and is ready for immediate use. IMPRESSION: Successful placement of a right internal jugular approach power injectable Port-A-Cath. The catheter is ready for immediate use. Electronically Signed   By: Sandi Mariscal M.D.   On: 04/15/2020 15:28   IR THORACENTESIS ASP PLEURAL SPACE W/IMG GUIDE  Result Date: 04/15/2020 INDICATION: Recent diagnosis diffuse large B-cell lymphoma, now with recurrent left-sided pleural effusion. Please perform ultrasound-guided thoracentesis for therapeutic purposes prior to PET-CT. EXAM: IR THORACENTESIS ASP PLEURAL SPACE W/IMG GUIDE COMPARISON:  Chest CT-04/07/2020  MEDICATIONS: None. COMPLICATIONS: None immediate. TECHNIQUE: Informed written consent was obtained from the patient after a discussion of the risks, benefits and alternatives to treatment. A timeout was performed prior to the initiation of the procedure. Initial ultrasound scanning demonstrates a small left-sided anechoic pleural effusion. The lower chest was prepped and draped in the usual sterile fashion. 1% lidocaine was used for local anesthesia. An ultrasound image was saved for documentation purposes. An 8 Fr Safe-T-Centesis catheter was introduced. The thoracentesis was performed. The catheter was removed and a dressing was applied. The patient tolerated the procedure well without immediate post procedural complication. The patient was escorted to have an upright chest radiograph. FINDINGS: A total of approximately 200 cc of orange colored pleural fluid was removed. IMPRESSION: Successful ultrasound-guided left sided thoracentesis yielding 200 cc orange colored pleural fluid. Electronically Signed   By: Sandi Mariscal M.D.   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 Hemoglobin C], childhood malaria. Patient notes that she had a CT of the abdomen sometime in 2020 that showed no acute pathology.This was not [facility]. Not accessible to [place] at this point. She did have an MRI of the abdomen in July 2019 which showed indeterminate  splenic lesions.No other acute abdominal pathology.No concern with hepatocellular carcinoma.  Patient is presenting now with  #1Pancreatic massalong with retroperitoneal lymphadenopathy and diffuse splenic lesions. No internal necrosis within the mass noted. Diffuse  splenomegaly. Left periaortic lymph node causing mild displacement of the third portion of the duodenum.  Significantly elevated LDH levels. 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  # 2 Left sided large pleural effusion and lung atelectasis. S/p diagnostic and therapeutic thoracentesis -- lymphocytic predominance @ 80%  HIV could be risk factors for high grade EBV driven lymphomas  Patient symptomatology has developed rather quickly over the last few weeks.  #29mcrocytic anemia-chronic.Some element of iron deficiency versus hemoglobinopathy versus anemia of chronic disease related to malignancy. Patient has received 1 dose of IV Feraheme on 04/08/2020 and repeated on 04/22/2020  #4history of HIV/AIDS [facility]   PLAN: -Labs from today been reviewed and are adequate to continue with treatment.  She will proceed with day 3 of cycle #2 of her chemotherapy today as scheduled. -Continue to check daily CBC with differential and CMET -Continue as needed antiemetics. -Continue MiraLAX daily -Will need to be cautious with rituximab to prevent recurrent hepatitis B flare.  Repeat HBV DNA undetectable on 04/13/2020. -Will coordinate care with her Infectious Disease Physician to prevent any drug interactions with Biktarvy -Will consider Intrathecal Methotrexate for CNS prophylaxis with future cycles.  We will need to arrange outpatient Rituxan, Neulasta, and a follow-up visit.  Scheduling message has been sent.  KMikey Bussing DNP, AGPCNP-BC, AOCNP Mon/Tues/Thurs/Fri 7am-5pm; Off Wednesdays Cell: [phone]    LOS: 2 days    ADDENDUM  .Patient was Personally and independently interviewed, examined and relevant elements of the history of present illness were reviewed in details and an assessment and plan was created. All elements of the  patient's history of present illness , assessment and plan were discussed in details with KMikey Bussing DNP, AGPCNP-BC, AOCNP. The above documentation reflects our combined findings assessment and plan.  GSullivan LoneMD MS

## 2020-05-13 ENCOUNTER — Telehealth: Payer: Self-pay | Admitting: Hematology

## 2020-05-13 ENCOUNTER — Ambulatory Visit: Payer: 59

## 2020-05-13 DIAGNOSIS — J90 Pleural effusion, not elsewhere classified: Secondary | ICD-10-CM

## 2020-05-13 LAB — CBC WITH DIFFERENTIAL/PLATELET
Abs Immature Granulocytes: 0.04 10*3/uL (ref 0.00–0.07)
Basophils Absolute: 0 10*3/uL (ref 0.0–0.1)
Basophils Relative: 0 %
Eosinophils Absolute: 0 10*3/uL (ref 0.0–0.5)
Eosinophils Relative: 0 %
HCT: 31.6 % — ABNORMAL LOW (ref 36.0–46.0)
Hemoglobin: 10.1 g/dL — ABNORMAL LOW (ref 12.0–15.0)
Immature Granulocytes: 1 %
Lymphocytes Relative: 5 %
Lymphs Abs: 0.2 10*3/uL — ABNORMAL LOW (ref 0.7–4.0)
MCH: 24.7 pg — ABNORMAL LOW (ref 26.0–34.0)
MCHC: 32 g/dL (ref 30.0–36.0)
MCV: 77.3 fL — ABNORMAL LOW (ref 80.0–100.0)
Monocytes Absolute: 0.2 10*3/uL (ref 0.1–1.0)
Monocytes Relative: 4 %
Neutro Abs: 4.5 10*3/uL (ref 1.7–7.7)
Neutrophils Relative %: 90 %
Platelets: 289 10*3/uL (ref 150–400)
RBC: 4.09 MIL/uL (ref 3.87–5.11)
RDW: 29.6 % — ABNORMAL HIGH (ref 11.5–15.5)
WBC: 5 10*3/uL (ref 4.0–10.5)
nRBC: 1 % — ABNORMAL HIGH (ref 0.0–0.2)

## 2020-05-13 LAB — COMPREHENSIVE METABOLIC PANEL
ALT: 21 U/L (ref 0–44)
AST: 17 U/L (ref 15–41)
Albumin: 3.7 g/dL (ref 3.5–5.0)
Alkaline Phosphatase: 74 U/L (ref 38–126)
Anion gap: 11 (ref 5–15)
BUN: 15 mg/dL (ref 6–20)
CO2: 25 mmol/L (ref 22–32)
Calcium: 9.4 mg/dL (ref 8.9–10.3)
Chloride: 100 mmol/L (ref 98–111)
Creatinine, Ser: 0.56 mg/dL (ref 0.44–1.00)
GFR calc Af Amer: >60 mL/min (ref >60)
GFR calc non Af Amer: >60 mL/min (ref >60)
Glucose, Bld: 126 mg/dL — ABNORMAL HIGH (ref 70–99)
Potassium: 3.6 mmol/L (ref 3.5–5.1)
Sodium: 136 mmol/L (ref 135–145)
Total Bilirubin: 0.8 mg/dL (ref 0.3–1.2)
Total Protein: 6.3 g/dL — ABNORMAL LOW (ref 6.5–8.1)

## 2020-05-13 MED ORDER — SODIUM CHLORIDE 0.9 % IV SOLN
Freq: Once | INTRAVENOUS | Status: AC
  Administered 2020-05-13: 18 mg via INTRAVENOUS
  Filled 2020-05-13: qty 4

## 2020-05-13 MED ORDER — SODIUM CHLORIDE 0.9 % IV SOLN
Freq: Once | INTRAVENOUS | Status: AC
  Administered 2020-05-14: 8 mg via INTRAVENOUS
  Filled 2020-05-13: qty 8

## 2020-05-13 MED ORDER — IBUPROFEN 200 MG PO TABS: 400.0000 mg | ORAL_TABLET | Freq: Four times a day (QID) | ORAL | 0 refills | Status: AC | PRN

## 2020-05-13 MED ORDER — SODIUM CHLORIDE 0.9 % IV SOLN
750.0000 mg/m2 | Freq: Once | INTRAVENOUS | Status: AC
  Administered 2020-05-14: 1180 mg via INTRAVENOUS
  Filled 2020-05-13: qty 59

## 2020-05-13 MED ORDER — POTASSIUM CHLORIDE CRYS ER 20 MEQ PO TBCR: 20.0000 meq | EXTENDED_RELEASE_TABLET | Freq: Every day | ORAL | 0 refills | Status: DC

## 2020-05-13 MED ORDER — ONDANSETRON HCL 8 MG PO TABS: 8.0000 mg | ORAL_TABLET | Freq: Three times a day (TID) | ORAL | 1 refills | Status: DC | PRN

## 2020-05-13 MED ORDER — VITAMIN D 25 MCG (1000 UNIT) PO TABS: 1000.0000 [IU] | ORAL_TABLET | Freq: Every day | ORAL | Status: AC

## 2020-05-13 MED ORDER — VINCRISTINE SULFATE CHEMO INJECTION 1 MG/ML
Freq: Once | INTRAVENOUS | Status: AC
  Filled 2020-05-13: qty 8

## 2020-05-13 NOTE — Telephone Encounter (Signed)
Called patient regarding upcoming appointment, patient is notified of time and date.

## 2020-05-13 NOTE — Progress Notes (Addendum)
HEMATOLOGY-ONCOLOGY PROGRESS NOTE  SUBJECTIVE:   Continues to tolerate her chemotherapy well overall.  Reports mild nausea but still able to eat without difficulty.  Denies mucositis, abdominal pain, vomiting.  Constipation well controlled with MiraLAX.  Repeat chest x-ray showed decrease in the degree of the left-sided pleural effusion compared to prior exam.  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[place])   04/18/2020 -  Chemotherapy   The patient had dexamethasone (DECADRON) 4 MG tablet, 8 mg, Oral, 2 times daily with meals, 1 of 1 cycle, Start date: --, End date: -- DOXOrubicin (ADRIAMYCIN) 16 mg, etoposide (VEPESID) 80 mg, vinCRIStine (ONCOVIN) 0.6 mg in sodium chloride 0.9 % 1,000 mL chemo infusion, , Intravenous, Once, 2 of 5 cycles Administration:  (04/18/2020),  (04/19/2020),  (05/10/2020),  (05/11/2020),  (04/20/2020),  (04/21/2020),  (05/12/2020) ondansetron (ZOFRAN) 8 mg, dexamethasone (DECADRON) 10 mg in sodium chloride 0.9 % 50 mL IVPB, , Intravenous,  Once, 2 of 5 cycles Administration: 8 mg (04/18/2020), 18 mg (04/19/2020), 36 mg (04/22/2020), 18 mg (05/10/2020), 15 mg (05/11/2020), 8 mg (04/20/2020), 18 mg (04/21/2020), 18 mg (05/12/2020) cyclophosphamide (CYTOXAN) 1,180 mg in sodium chloride 0.9 % 250 mL chemo infusion, 750 mg/m2 = 1,180 mg, Intravenous,  Once, 2 of 5 cycles Administration: 1,180 mg (04/22/2020)  for chemotherapy treatment.    04/25/2020 -  Chemotherapy   The patient had pegfilgrastim-cbqv (UDENYCA) injection 6 mg, 6 mg, Subcutaneous, Once, 1 of 4 cycles Administration: 6 mg (04/25/2020) riTUXimab-pvvr (RUXIENCE) 600 mg in sodium chloride 0.9 % 250 mL (1.9355 mg/mL) infusion, 375 mg/m2 = 600 mg, Intravenous,  Once, 1 of 1 cycle Administration: 600 mg (04/25/2020)  for chemotherapy treatment.       REVIEW OF SYSTEMS:   10 Point review  of Systems was done is negative except as noted above.   I have reviewed the past medical history, past surgical history, social history and family history with the patient and they are unchanged from previous note.   PHYSICAL EXAMINATION: ECOG PERFORMANCE STATUS: 1 - Symptomatic but completely ambulatory  Vitals:   05/12/20 2036 05/13/20 0556  BP: 136/84 (!) 145/91  Pulse: 73 64  Resp: 16 14  Temp: (!) 97.5 F (36.4 C) 98.2 F (36.8 C)  SpO2: 100% 100%   Filed Weights   05/10/20 1622  Weight: 57.2 kg    Intake/Output from previous day: 07/08 0701 - 07/09 0700 In: 478.6 [IV Piggyback:478.6] Out: -   GENERAL:alert, in no acute distress and comfortable SKIN: no acute rashes, no significant lesions EYES: conjunctiva are pink and non-injected, sclera anicteric OROPHARYNX: MMM, no exudates, no oropharyngeal erythema or ulceration NECK: supple, no JVD LYMPH:  no palpable lymphadenopathy in the cervical, axillary or inguinal regions LUNGS: decreased air entry left lung base. HEART: regular rate & rhythm ABDOMEN:  normoactive bowel sounds , non tender, not distended. Extremity: no pedal edema PSYCH: alert & oriented x 3 with fluent speech NEURO: no focal motor/sensory deficits   LABORATORY DATA:  I have reviewed the data as listed CMP Latest Ref Rng & Units 05/13/2020 05/12/2020 05/11/2020  Glucose 70 - 99 mg/dL 126(H) 160(H) 183(H)  BUN 6 - 20 mg/dL _0 Creatinine 0.44 - 1.00 mg/dL 0.56 0.56 0.55  Sodium 135 - 145 mmol/L 136 139 135  Potassium 3.5 - 5.1 mmol/L 3.6 3.8 3.9  Chloride 98 - 111  mmol/L 100 106 103  CO2 22 - 32 mmol/L _0 Calcium 8.9 - 10.3 mg/dL 9.4 9.6 9.4  Total Protein 6.5 - 8.1 g/dL 6.3(L) 6.4(L) 6.4(L)  Total Bilirubin 0.3 - 1.2 mg/dL 0.8 0.6 0.4  Alkaline Phos 38 - 126 U/L 74 83 92  AST 15 - 41 U/L _1 ALT 0 - 44 U/L _2 Lab Results  Component Value Date   WBC 5.0 05/13/2020   HGB 10.1 (L) 05/13/2020   HCT 31.6 (L)  05/13/2020   MCV 77.3 (L) 05/13/2020   PLT 289 05/13/2020   NEUTROABS 4.5 05/13/2020    DG Chest 2 View  Result Date: 05/12/2020 CLINICAL DATA:  Chronic anemia with weight loss and decreased appetite EXAM: CHEST - 2 VIEW COMPARISON:  04/14/2020 FINDINGS: Cardiac shadows within normal limits. Right chest wall port is noted in satisfactory position. Small left pleural effusion is noted but decreased from the prior exam. Underlying atelectatic changes are noted. No bony abnormality is seen. IMPRESSION: Decrease in the degree of left-sided pleural effusion when compared with the prior exam. Electronically Signed   By: Inez Catalina M.D.   On: 05/12/2020 19:01   DG Chest 2 View  Result Date: 04/14/2020 CLINICAL DATA:  Shortness of breath EXAM: CHEST - 2 VIEW COMPARISON:  04/08/2020 FINDINGS: Moderate to large left pleural effusion, slightly decreased since prior study. Left base atelectasis or infiltrate. Right lung clear. Heart is normal size. IMPRESSION: Moderate to large left effusion with left base atelectasis or infiltrate. Effusion slightly decreased since prior study. Electronically Signed   By: Rolm Baptise M.D.   On: 04/14/2020 14:39   DG Abd 1 View  Result Date: 04/14/2020 CLINICAL DATA:  Shortness of breath, abdominal swelling EXAM: ABDOMEN - 1 VIEW COMPARISON:  CT 04/06/2020 FINDINGS: Nonobstructive bowel gas pattern. No free air or suspicious calcification. Moderate stool burden throughout the colon. Prominent soft tissue density seen in the left upper abdomen corresponding to the previously seen large left upper quadrant mass involving the pancreas and spleen on prior CT. IMPRESSION: No evidence of bowel obstruction or free air. Prominent soft tissue density in the left upper quadrant corresponding to the large mass seen involving much of the pancreas and spleen on prior CT. Electronically Signed   By: Rolm Baptise M.D.   On: 04/14/2020 14:40   NM PET Image Initial (PI) Skull Base To  Thigh  Result Date: 04/15/2020 CLINICAL DATA:  Initial treatment strategy for diffuse large B-cell lymphoma. EXAM: NUCLEAR MEDICINE PET SKULL BASE TO THIGH TECHNIQUE: 6.4 mCi F-18 FDG was injected intravenously. Full-ring PET imaging was performed from the skull base to thigh after the radiotracer. CT data was obtained and used for attenuation correction and anatomic localization. Fasting blood glucose: 101 mg/dl COMPARISON:  CT scan 04/06/2020 FINDINGS: Mediastinal blood pool activity: SUV max 1.84 Liver activity: SUV max 2.61 NECK: No hypermetabolic lymph nodes in the neck. Incidental CT findings: none CHEST: No hypermetabolic mediastinal or hilar nodes. No suspicious pulmonary nodules on the CT scan. No hypermetabolic breast lesions, supraclavicular or axillary adenopathy. Incidental CT findings: Persistent left pleural effusion and overlying left lower lobe atelectasis. ABDOMEN/PELVIS: Large left upper quadrant abdominal mass involving the spleen, stomach and retroperitoneum is markedly hypermetabolic with SUV max of 35.59. Associated contralateral retroperitoneal lymphadenopathy is hypermetabolic with SUV max of 74.16. No pelvic lymphadenopathy. No inguinal lymphadenopathy. No worrisome hepatic lesions. Incidental CT findings: Markedly enlarged fibroid uterus is noted.  Simple appearing cyst associated with the left ovary. Subcutaneous lesions involving the anterior abdominal wall. One is near the umbilicus. The other is overlying the mid pelvis. These have the appearance of some type of skin blister. Recommend clinical correlation. Mild hypermetabolism could suggest an inflammatory process. Given patient's age IV status Kaposi's sarcoma would be another possibility. SKELETON: No findings suspicious for osseous metastatic disease. Incidental CT findings: none IMPRESSION: 1. Large left upper quadrant mass is markedly hypermetabolic and consistent with patient's known lymphoma. 2. No findings to suggest  involvement of the neck, chest or pelvis or bony structures. 3. Enlarged fibroid uterus. 4. Midline anterior abdominal skin lesions. Mild hypermetabolism. These could reflect some type of skin blisters. I suppose Kaposi's sarcoma would be another consideration given the patient's HIV status. Electronically Signed   By: Marijo Sanes M.D.   On: 04/15/2020 14:20   ECHOCARDIOGRAM COMPLETE  Result Date: 04/18/2020    ECHOCARDIOGRAM REPORT   Patient Name:   ANAH BILLARD Date of Exam: 04/18/2020 Medical Rec #:  [id number]       Height:       60.0 in Accession #:    [id number]      Weight:       127.0 lb Date of Birth:  1976-08-19        BSA:          1.539 m Patient Age:    43 years        BP:           142/98 mmHg Patient Gender: F               HR:           91 bpm. Exam Location:  Inpatient Procedure: 2D Echo, Cardiac Doppler, Color Doppler and Strain Analysis STAT ECHO Indications:    Chemotherapy evaluation v87.41 / v58.11  History:        Patient has no prior history of Echocardiogram examinations.                 Risk Factors:Non-Smoker. Pleural effusion.  Sonographer:    Vickie Epley RDCS Referring Phys: [place]  1. Left ventricular ejection fraction, by estimation, is 60 to 65%. The left ventricle has normal function. The left ventricle has no regional wall motion abnormalities. Left ventricular diastolic parameters were normal. The average left ventricular global longitudinal strain is -22.3 %.  2. Right ventricular systolic function is normal. The right ventricular size is normal. Tricuspid regurgitation signal is inadequate for assessing PA pressure.  3. The mitral valve is normal in structure. Trivial mitral valve regurgitation.  4. The aortic valve is tricuspid. Aortic valve regurgitation is not visualized. No aortic stenosis is present.  5. The inferior vena cava is normal in size with greater than 50% respiratory variability, suggesting right atrial pressure of 3 mmHg. FINDINGS   Left Ventricle: Left ventricular ejection fraction, by estimation, is 60 to 65%. The left ventricle has normal function. The left ventricle has no regional wall motion abnormalities. The average left ventricular global longitudinal strain is -22.3 %. The left ventricular internal cavity size was normal in size. There is no left ventricular hypertrophy. Left ventricular diastolic parameters were normal. Right Ventricle: The right ventricular size is normal. Right vetricular wall thickness was not assessed. Right ventricular systolic function is normal. Tricuspid regurgitation signal is inadequate for assessing PA pressure. Left Atrium: Left atrial size was normal in size. Right Atrium: Right atrial size was normal  in size. Pericardium: Trivial pericardial effusion is present. Mitral Valve: The mitral valve is normal in structure. Trivial mitral valve regurgitation. Tricuspid Valve: The tricuspid valve is normal in structure. Tricuspid valve regurgitation is not demonstrated. Aortic Valve: The aortic valve is tricuspid. Aortic valve regurgitation is not visualized. No aortic stenosis is present. Pulmonic Valve: The pulmonic valve was not well visualized. Pulmonic valve regurgitation is not visualized. Aorta: The aortic root is normal in size and structure. Venous: The inferior vena cava is normal in size with greater than 50% respiratory variability, suggesting right atrial pressure of 3 mmHg. IAS/Shunts: The interatrial septum was not well visualized. Additional Comments: There is pleural effusion in the left lateral region.  LEFT VENTRICLE PLAX 2D LVIDd:         4.00 cm     Diastology LVIDs:         2.80 cm     LV e' lateral:   12.40 cm/s LV PW:         0.70 cm     LV E/e' lateral: 7.3 LV IVS:        0.70 cm     LV e' medial:    8.70 cm/s LVOT diam:     1.60 cm     LV E/e' medial:  10.4 LV SV:         36 LV SV Index:   23          2D Longitudinal Strain LVOT Area:     2.01 cm    2D Strain GLS Avg:     -22.3 %  LV  Volumes (MOD) LV vol d, MOD A2C: 83.6 ml LV vol d, MOD A4C: 74.0 ml LV vol s, MOD A2C: 33.0 ml LV vol s, MOD A4C: 37.0 ml LV SV MOD A2C:     50.6 ml LV SV MOD A4C:     74.0 ml LV SV MOD BP:      43.6 ml RIGHT VENTRICLE RV S prime:     13.80 cm/s TAPSE (M-mode): 2.2 cm LEFT ATRIUM             Index       RIGHT ATRIUM          Index LA diam:        4.00 cm 2.60 cm/m  RA Area:     7.07 cm LA Vol (A2C):   17.7 ml 11.50 ml/m RA Volume:   9.87 ml  6.41 ml/m LA Vol (A4C):   26.1 ml 16.96 ml/m LA Biplane Vol: 23.4 ml 15.20 ml/m  AORTIC VALVE LVOT Vmax:   94.60 cm/s LVOT Vmean:  59.100 cm/s LVOT VTI:    0.177 m  AORTA Ao Root diam: 2.90 cm MITRAL VALVE MV Area (PHT): 4.80 cm    SHUNTS MV Decel Time: 158 msec    Systemic VTI:  0.18 m MV E velocity: 90.80 cm/s  Systemic Diam: 1.60 cm MV A velocity: 85.90 cm/s MV E/A ratio:  1.06 Oswaldo Milian MD Electronically signed by Oswaldo Milian MD Signature Date/Time: 04/18/2020/12:07:09 PM    Final    IR IMAGING GUIDED PORT INSERTION  Result Date: 04/15/2020 INDICATION: History of diffuse large B-cell lymphoma. In need of durable intravenous access for chemotherapy administration. EXAM: IMPLANTED PORT A CATH PLACEMENT WITH ULTRASOUND AND FLUOROSCOPIC GUIDANCE COMPARISON:  Chest CT-04/07/2020 MEDICATIONS: Ancef 2 gm IV; The antibiotic was administered within an appropriate time interval prior to skin puncture. ANESTHESIA/SEDATION: Moderate (conscious) sedation was employed during this procedure.  A total of Versed 4 mg and Fentanyl 100 mcg was administered intravenously. Moderate Sedation Time: 26 minutes. The patient's level of consciousness and vital signs were monitored continuously by radiology nursing throughout the procedure under my direct supervision. CONTRAST:  None FLUOROSCOPY TIME:  30 seconds (5 mGy) COMPLICATIONS: None immediate. PROCEDURE: The procedure, risks, benefits, and alternatives were explained to the patient. Questions regarding the procedure  were encouraged and answered. The patient understands and consents to the procedure. The right neck and chest were prepped with chlorhexidine in a sterile fashion, and a sterile drape was applied covering the operative field. Maximum barrier sterile technique with sterile gowns and gloves were used for the procedure. A timeout was performed prior to the initiation of the procedure. Local anesthesia was provided with 1% lidocaine with epinephrine. After creating a small venotomy incision, a micropuncture kit was utilized to access the internal jugular vein. Real-time ultrasound guidance was utilized for vascular access including the acquisition of a permanent ultrasound image documenting patency of the accessed vessel. The microwire was utilized to measure appropriate catheter length. A subcutaneous port pocket was then created along the upper chest wall utilizing a combination of sharp and blunt dissection. The pocket was irrigated with sterile saline. A single lumen "ISP" sized power injectable port was chosen for placement. The 8 Fr catheter was tunneled from the port pocket site to the venotomy incision. The port was placed in the pocket. The external catheter was trimmed to appropriate length. At the venotomy, an 8 Fr peel-away sheath was placed over a guidewire under fluoroscopic guidance. The catheter was then placed through the sheath and the sheath was removed. Final catheter positioning was confirmed and documented with a fluoroscopic spot radiograph. The port was accessed with a Huber needle, aspirated and flushed with heparinized saline. The venotomy site was closed with an interrupted 4-0 Vicryl suture. The port pocket incision was closed with interrupted 2-0 Vicryl suture. The skin was opposed with a running subcuticular 4-0 Vicryl suture. Dermabond and Steri-strips were applied to both incisions. Dressings were applied. The patient tolerated the procedure well without immediate post procedural  complication. FINDINGS: After catheter placement, the tip lies within the superior cavoatrial junction. The catheter aspirates and flushes normally and is ready for immediate use. IMPRESSION: Successful placement of a right internal jugular approach power injectable Port-A-Cath. The catheter is ready for immediate use. Electronically Signed   By: Sandi Mariscal M.D.   On: 04/15/2020 15:28   IR THORACENTESIS ASP PLEURAL SPACE W/IMG GUIDE  Result Date: 04/15/2020 INDICATION: Recent diagnosis diffuse large B-cell lymphoma, now with recurrent left-sided pleural effusion. Please perform ultrasound-guided thoracentesis for therapeutic purposes prior to PET-CT. EXAM: IR THORACENTESIS ASP PLEURAL SPACE W/IMG GUIDE COMPARISON:  Chest CT-04/07/2020 MEDICATIONS: None. COMPLICATIONS: None immediate. TECHNIQUE: Informed written consent was obtained from the patient after a discussion of the risks, benefits and alternatives to treatment. A timeout was performed prior to the initiation of the procedure. Initial ultrasound scanning demonstrates a small left-sided anechoic pleural effusion. The lower chest was prepped and draped in the usual sterile fashion. 1% lidocaine was used for local anesthesia. An ultrasound image was saved for documentation purposes. An 8 Fr Safe-T-Centesis catheter was introduced. The thoracentesis was performed. The catheter was removed and a dressing was applied. The patient tolerated the procedure well without immediate post procedural complication. The patient was escorted to have an upright chest radiograph. FINDINGS: A total of approximately 200 cc of orange colored pleural fluid was removed. IMPRESSION:  Successful ultrasound-guided left sided thoracentesis yielding 200 cc orange colored pleural fluid. Electronically Signed   By: Sandi Mariscal M.D.   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 Hemoglobin C], childhood malaria. Patient notes that she had a CT of the abdomen sometime in 2020 that showed no acute pathology.This was not [facility]. Not accessible to [place] at this point. She did have an MRI of the abdomen in July 2019 which showed indeterminate splenic lesions.No other acute abdominal pathology.No concern with hepatocellular carcinoma.  Patient is presenting now with  #1Pancreatic massalong with retroperitoneal lymphadenopathy and diffuse splenic lesions. No internal necrosis within the mass noted. Diffuse splenomegaly. Left periaortic lymph node causing mild displacement of the third portion of the duodenum.  Significantly elevated LDH levels. 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  # 2 Left sided large pleural effusion and lung atelectasis. S/p diagnostic and therapeutic thoracentesis -- lymphocytic predominance @ 80%  HIV could be risk factors for high grade EBV driven lymphomas  Patient symptomatology has developed rather quickly over the last few weeks.  #53mcrocytic anemia-chronic.Some element of iron deficiency versus hemoglobinopathy versus anemia of chronic disease related to malignancy. Patient has received 1 dose of IV Feraheme on 04/08/2020 and repeated on 04/22/2020  #4history of HIV/AIDS [facility]   PLAN: -Labs from today been reviewed and are adequate to continue with treatment.  She will proceed with day 4 of cycle #2 of her chemotherapy today as scheduled. -Continue to check daily CBC with differential and CMET -Continue as needed antiemetics. -Continue MiraLAX daily -Will need to be cautious with rituximab  to prevent recurrent hepatitis B flare.  Repeat HBV DNA undetectable on 04/13/2020. -Will coordinate care with her Infectious Disease Physician to prevent any drug interactions with Biktarvy -Will consider Intrathecal Methotrexate for CNS prophylaxis with future cycles.  She has been scheduled for outpatient Rituxan and Neulasta on 05/16/2020.  Outpatient follow-up with Dr. KIrene Limbohas not yet been scheduled and will follow up with scheduling about this.  KMikey Bussing DNP, AGPCNP-BC, AOCNP Mon/Tues/Thurs/Fri 7am-5pm; Off Wednesdays Cell: [phone]    LOS: 3 days     ADDENDUM  .Patient was Personally and independently interviewed, examined and relevant elements of the history of present illness were reviewed in details and an assessment and plan was created. All elements of the patient's history of present illness , assessment and plan were discussed in details with KMikey Bussing DNP, AGPCNP-BC, AOCNP. The above documentation reflects our combined findings assessment and plan.  GSullivan LoneMD MS

## 2020-05-13 NOTE — Discharge Summary (Signed)
Physician Discharge Summary  Patient ID: Heather [name] MRN: [id number] DOB/AGE: 1975/12/21 44 y.o.  Admit date: 05/10/2020 Discharge date: 05/13/2020  Discharge Diagnoses:  Active Problems:   Pleural effusion exudative   Diffuse large B-cell lymphoma of lymph nodes of multiple regions (HCC)   Diffuse large B-cell lymphoma of lymph nodes of multiple sites [facility])   Encounter for antineoplastic chemotherapy   Discharged Condition: good  Discharge Labs:    CBC    Component Value Date/Time   WBC 2.3 (L) 05/14/2020 0606   RBC 4.29 05/14/2020 0606   HGB 10.2 (L) 05/14/2020 0606   HGB 9.9 (L) 04/28/2020 1217   HCT 32.4 (L) 05/14/2020 0606   PLT 304 05/14/2020 0606   PLT 198 04/28/2020 1217   MCV 75.5 (L) 05/14/2020 0606   MCH 23.8 (L) 05/14/2020 0606   MCHC 31.5 05/14/2020 0606   RDW 28.9 (H) 05/14/2020 0606   LYMPHSABS 0.2 (L) 05/14/2020 0606   MONOABS 0.1 05/14/2020 0606   EOSABS 0.0 05/14/2020 0606   BASOSABS 0.0 05/14/2020 0606   CMP Latest Ref Rng & Units 05/14/2020 05/13/2020 05/12/2020  Glucose 70 - 99 mg/dL 135(H) 126(H) 160(H)  BUN 6 - 20 mg/dL 12 15 12   Creatinine 0.44 - 1.00 mg/dL 0.56 0.56 0.56  Sodium 135 - 145 mmol/L 133(L) 136 139  Potassium 3.5 - 5.1 mmol/L 3.4(L) 3.6 3.8  Chloride 98 - 111 mmol/L 100 100 106  CO2 22 - 32 mmol/L 27 25 25   Calcium 8.9 - 10.3 mg/dL 9.1 9.4 9.6  Total Protein 6.5 - 8.1 g/dL 6.5 6.3(L) 6.4(L)  Total Bilirubin 0.3 - 1.2 mg/dL 0.5 0.8 0.6  Alkaline Phos 38 - 126 U/L 72 74 83  AST 15 - 41 U/L 35 17 15  ALT 0 - 44 U/L 54(H) 21 20    Consults: None  Procedures: None  Disposition:  There are no questions and answers to display.       Allergies as of 05/14/2020   No Known Allergies     Medication List    TAKE these medications   acetaminophen 325 MG tablet Commonly known as: TYLENOL Take 2 tablets (650 mg total) by mouth every 4 (four) hours as needed for mild pain.   B COMPLEX PO Take 1 tablet by mouth daily.    Biktarvy 50-200-25 MG Tabs tablet Generic drug: bictegravir-emtricitabine-tenofovir AF Take 1 tablet by mouth daily with breakfast.   cholecalciferol 25 MCG (1000 UNIT) tablet Commonly known as: VITAMIN D3 Take 1 tablet (1,000 Units total) by mouth at bedtime.   feeding supplement (ENSURE ENLIVE) Liqd Take 237 mLs by mouth 2 (two) times daily between meals.   HYDROcodone-acetaminophen 5-325 MG tablet Commonly known as: Norco Take 1 tablet by mouth every 6 (six) hours as needed for moderate pain.   ibuprofen 200 MG tablet Commonly known as: ADVIL Take 2 tablets (400 mg total) by mouth every 6 (six) hours as needed for mild pain.   lidocaine-prilocaine cream Commonly known as: EMLA Apply to affected area once What changed:   how much to take  how to take this  when to take this  reasons to take this   LORazepam 0.5 MG tablet Commonly known as: ATIVAN Take 1 tablet (0.5 mg total) by mouth every 6 (six) hours as needed (for chemo induced nausea or vomiting).   multivitamin with minerals Tabs tablet Take 1 tablet by mouth at bedtime.   ondansetron 8 MG tablet Commonly known as: ZOFRAN Take 1 tablet (  8 mg total) by mouth every 8 (eight) hours as needed for nausea or vomiting.   pantoprazole 40 MG tablet Commonly known as: PROTONIX Take 1 tablet (40 mg total) by mouth daily for 14 days.   polyethylene glycol 17 g packet Commonly known as: MIRALAX / GLYCOLAX Take 17 g by mouth daily as needed for mild constipation.   potassium chloride SA 20 MEQ tablet Commonly known as: KLOR-CON Take 1 tablet (20 mEq total) by mouth daily. Take for 5 days following hospital discharge. What changed: additional instructions   prochlorperazine 10 MG tablet Commonly known as: COMPAZINE Take 1 tablet (10 mg total) by mouth every 6 (six) hours as needed for nausea or vomiting.   sodium bicarbonate/sodium chloride Soln 1 application by Mouth Rinse route 4 (four) times daily.        HPI: Heather [name] is a wonderful 44 y.o. female with a past medical history significant for hepatitis B, HIV, chronic anemia who presented to [place] with abdominal pain. She had a 2-week history of early satiety, poor appetite and, 5 pound weight loss. Abdominal pain is typically postprandial and mainly at night after dinner while laying in bed. Has been associated with nausea improves after vomiting. Labs on admission showed a hemoglobin of 9.3, MCV 67.4, platelet count 554,000. Lipase was mildly elevated at 123. 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  During her initial consult, she was having ongoingpain is primarily in her epigastric area and radiates to the left side of her abdomen. States pain is worse when laying down after she eats. She states that she has had this pain for at least 2 weeks. Her abdominal pain is worse after eating. She has had some nausea and intermittent vomiting. Vomiting makes her pain better. She was attributing her symptoms to some the medications that she was taking for fertility treatments. She reports a poor appetite and about a 5 pound weight loss. She is not having any fevers or chills. Denies night sweats. She has not noticed any palpable lymphadenopathy. The patient underwent a biopsy of her pancreatic mass on 04/08/2020 ([id number]) with results feeling diffuse large B-cell lymphoma.  It was recommended for her to undergo systemic chemotherapy with EPOCH-R.   On the day of admission, the patient was  feeling well.  She reports that her appetite was improving.  Abdominal pain resolved and she was not having any nausea or vomiting.  She had no chest pain or shortness of breath. She was admitted for cycle #2 of EPOCH-R.   Hospital Course:  The patient tolerated her second cycle of chemotherapy well overall.  She had mild nausea without any vomiting.  She also reported mild constipation and was placed on MiraLAX with resolution of the constipation.  She had no abdominal pain, chest pain, shortness of breath, or mucositis this admission.  A chest x-ray was obtained this admission which showed improvement in her left-sided pleural effusion.  On the day of discharge [place] has no acute new issues.   Refills for Zofran and potassium have been sent to her pharmacy.  The patient will return to the cancer center on Monday,05/16/2020  for Rituxan and Neulasta and she will have a follow-up visit with labs on 05/23/2020.  Exam:  GENERAL:alert, in no acute distress and comfortable SKIN: no acute rashes, no significant lesions EYES: conjunctiva are pink and non-injected, sclera anicteric OROPHARYNX: MMM, no exudates, no oropharyngeal erythema or ulceration NECK: supple, no JVD LYMPH:  no palpable lymphadenopathy in the cervical, axillary or inguinal regions LUNGS: decreased air entry left lung base. HEART: regular rate & rhythm ABDOMEN:  normoactive bowel sounds , non tender, not distended. Extremity: no pedal edema PSYCH: alert & oriented x 3 with fluent speech NEURO: no focal motor/sensory deficits   Signed: Mikey [name] 05/13/2020, 3:45 PM   ADDENDUM  .Patient was Personally and independently interviewed, examined and relevant elements of the discharge plan were reviewed in details and an assessment and plan was created. All elements of the patient'sdischarge plan were discussed in details with Heather [name]. The above documentation reflects our combined findings assessment and plan.  Heather Lone MD MS  TT spent discharging patient > 69mins

## 2020-05-13 NOTE — Telephone Encounter (Signed)
Scheduled appt per 7/8 sch msg - pt is aware of appts added for chemo on 7/12

## 2020-05-14 ENCOUNTER — Other Ambulatory Visit: Payer: Self-pay | Admitting: Oncology

## 2020-05-14 DIAGNOSIS — Z9189 Other specified personal risk factors, not elsewhere classified: Secondary | ICD-10-CM

## 2020-05-14 DIAGNOSIS — C8338 Diffuse large B-cell lymphoma, lymph nodes of multiple sites: Secondary | ICD-10-CM

## 2020-05-14 LAB — COMPREHENSIVE METABOLIC PANEL
ALT: 54 U/L — ABNORMAL HIGH (ref 0–44)
AST: 35 U/L (ref 15–41)
Albumin: 3.4 g/dL — ABNORMAL LOW (ref 3.5–5.0)
Alkaline Phosphatase: 72 U/L (ref 38–126)
Anion gap: 6 (ref 5–15)
BUN: 12 mg/dL (ref 6–20)
CO2: 27 mmol/L (ref 22–32)
Calcium: 9.1 mg/dL (ref 8.9–10.3)
Chloride: 100 mmol/L (ref 98–111)
Creatinine, Ser: 0.56 mg/dL (ref 0.44–1.00)
GFR calc Af Amer: >60 mL/min (ref >60)
GFR calc non Af Amer: >60 mL/min (ref >60)
Glucose, Bld: 135 mg/dL — ABNORMAL HIGH (ref 70–99)
Potassium: 3.4 mmol/L — ABNORMAL LOW (ref 3.5–5.1)
Sodium: 133 mmol/L — ABNORMAL LOW (ref 135–145)
Total Bilirubin: 0.5 mg/dL (ref 0.3–1.2)
Total Protein: 6.5 g/dL (ref 6.5–8.1)

## 2020-05-14 LAB — CBC WITH DIFFERENTIAL/PLATELET
Abs Immature Granulocytes: 0.02 10*3/uL (ref 0.00–0.07)
Basophils Absolute: 0 10*3/uL (ref 0.0–0.1)
Basophils Relative: 0 %
Eosinophils Absolute: 0 10*3/uL (ref 0.0–0.5)
Eosinophils Relative: 0 %
HCT: 32.4 % — ABNORMAL LOW (ref 36.0–46.0)
Hemoglobin: 10.2 g/dL — ABNORMAL LOW (ref 12.0–15.0)
Immature Granulocytes: 1 %
Lymphocytes Relative: 9 %
Lymphs Abs: 0.2 10*3/uL — ABNORMAL LOW (ref 0.7–4.0)
MCH: 23.8 pg — ABNORMAL LOW (ref 26.0–34.0)
MCHC: 31.5 g/dL (ref 30.0–36.0)
MCV: 75.5 fL — ABNORMAL LOW (ref 80.0–100.0)
Monocytes Absolute: 0.1 10*3/uL (ref 0.1–1.0)
Monocytes Relative: 3 %
Neutro Abs: 2 10*3/uL (ref 1.7–7.7)
Neutrophils Relative %: 87 %
Platelets: 304 10*3/uL (ref 150–400)
RBC: 4.29 MIL/uL (ref 3.87–5.11)
RDW: 28.9 % — ABNORMAL HIGH (ref 11.5–15.5)
WBC: 2.3 10*3/uL — ABNORMAL LOW (ref 4.0–10.5)
nRBC: 0 % (ref 0.0–0.2)

## 2020-05-14 MED ORDER — HEPARIN SOD (PORK) LOCK FLUSH 100 UNIT/ML IV SOLN
500.0000 [IU] | Freq: Once | INTRAVENOUS | Status: AC
  Administered 2020-05-14: 500 [IU] via INTRAVENOUS
  Filled 2020-05-14: qty 5

## 2020-05-16 ENCOUNTER — Inpatient Hospital Stay: Payer: 59

## 2020-05-16 ENCOUNTER — Other Ambulatory Visit: Payer: Self-pay | Admitting: Hematology

## 2020-05-16 ENCOUNTER — Other Ambulatory Visit: Payer: Self-pay

## 2020-05-16 VITALS — BP 123/74 | HR 93 | Temp 98.1°F | Resp 17 | Wt 127.2 lb

## 2020-05-16 DIAGNOSIS — C8330 Diffuse large B-cell lymphoma, unspecified site: Secondary | ICD-10-CM | POA: Diagnosis not present

## 2020-05-16 DIAGNOSIS — C8338 Diffuse large B-cell lymphoma, lymph nodes of multiple sites: Secondary | ICD-10-CM

## 2020-05-16 DIAGNOSIS — Z5112 Encounter for antineoplastic immunotherapy: Secondary | ICD-10-CM | POA: Diagnosis not present

## 2020-05-16 DIAGNOSIS — B20 Human immunodeficiency virus [HIV] disease: Secondary | ICD-10-CM | POA: Diagnosis not present

## 2020-05-16 DIAGNOSIS — Z7189 Other specified counseling: Secondary | ICD-10-CM

## 2020-05-16 DIAGNOSIS — Z79899 Other long term (current) drug therapy: Secondary | ICD-10-CM | POA: Diagnosis not present

## 2020-05-16 MED ORDER — SODIUM CHLORIDE 0.9% FLUSH
10.0000 mL | INTRAVENOUS | Status: DC | PRN
  Administered 2020-05-16: 10 mL
  Filled 2020-05-16: qty 10

## 2020-05-16 MED ORDER — ACETAMINOPHEN 325 MG PO TABS
ORAL_TABLET | ORAL | Status: AC
  Filled 2020-05-16: qty 2

## 2020-05-16 MED ORDER — ACETAMINOPHEN 325 MG PO TABS
650.0000 mg | ORAL_TABLET | Freq: Once | ORAL | Status: AC
  Administered 2020-05-16: 650 mg via ORAL

## 2020-05-16 MED ORDER — HYDROXYZINE HCL 25 MG PO TABS
25.0000 mg | ORAL_TABLET | Freq: Once | ORAL | Status: AC
  Administered 2020-05-16: 25 mg via ORAL
  Filled 2020-05-16: qty 1

## 2020-05-16 MED ORDER — SODIUM CHLORIDE 0.9 % IV SOLN
375.0000 mg/m2 | Freq: Once | INTRAVENOUS | Status: AC
  Administered 2020-05-16: 600 mg via INTRAVENOUS
  Filled 2020-05-16: qty 50

## 2020-05-16 MED ORDER — HEPARIN SOD (PORK) LOCK FLUSH 100 UNIT/ML IV SOLN
500.0000 [IU] | Freq: Once | INTRAVENOUS | Status: AC | PRN
  Administered 2020-05-16: 500 [IU]
  Filled 2020-05-16: qty 5

## 2020-05-16 MED ORDER — PEGFILGRASTIM-CBQV 6 MG/0.6ML ~~LOC~~ SOSY
6.0000 mg | PREFILLED_SYRINGE | Freq: Once | SUBCUTANEOUS | Status: AC
  Administered 2020-05-16: 6 mg via SUBCUTANEOUS

## 2020-05-16 MED ORDER — SODIUM CHLORIDE 0.9 % IV SOLN
Freq: Once | INTRAVENOUS | Status: AC
  Filled 2020-05-16: qty 250

## 2020-05-16 MED ORDER — PEGFILGRASTIM-CBQV 6 MG/0.6ML ~~LOC~~ SOSY
PREFILLED_SYRINGE | SUBCUTANEOUS | Status: AC
  Filled 2020-05-16: qty 0.6

## 2020-05-16 NOTE — Patient Instructions (Signed)
[facility] Discharge Instructions for Patients Receiving Chemotherapy  Today you received the following chemotherapy agents:  Rituxan.  To help prevent nausea and vomiting after your treatment, we encourage you to take your nausea medication as directed.   If you develop nausea and vomiting that is not controlled by your nausea medication, call the clinic.   BELOW ARE SYMPTOMS THAT SHOULD BE REPORTED IMMEDIATELY:  *FEVER GREATER THAN 100.5 F  *CHILLS WITH OR WITHOUT FEVER  NAUSEA AND VOMITING THAT IS NOT CONTROLLED WITH YOUR NAUSEA MEDICATION  *UNUSUAL SHORTNESS OF BREATH  *UNUSUAL BRUISING OR BLEEDING  TENDERNESS IN MOUTH AND THROAT WITH OR WITHOUT PRESENCE OF ULCERS  *URINARY PROBLEMS  *BOWEL PROBLEMS  UNUSUAL RASH Items with * indicate a potential emergency and should be followed up as soon as possible.  Feel free to call the clinic should you have any questions or concerns. The clinic phone number is [phone].  Please show the CHEMO ALERT CARD at check-in to the Emergency Department and triage nurse.   

## 2020-05-23 ENCOUNTER — Inpatient Hospital Stay: Payer: 59 | Admitting: Hematology

## 2020-05-23 ENCOUNTER — Inpatient Hospital Stay: Payer: 59

## 2020-05-23 ENCOUNTER — Other Ambulatory Visit: Payer: Self-pay

## 2020-05-23 VITALS — BP 115/81 | HR 105 | Temp 97.7°F | Resp 18 | Ht 60.0 in | Wt 127.9 lb

## 2020-05-23 DIAGNOSIS — C8338 Diffuse large B-cell lymphoma, lymph nodes of multiple sites: Secondary | ICD-10-CM | POA: Diagnosis not present

## 2020-05-23 DIAGNOSIS — Z95828 Presence of other vascular implants and grafts: Secondary | ICD-10-CM

## 2020-05-23 DIAGNOSIS — Z5112 Encounter for antineoplastic immunotherapy: Secondary | ICD-10-CM | POA: Diagnosis not present

## 2020-05-23 DIAGNOSIS — J90 Pleural effusion, not elsewhere classified: Secondary | ICD-10-CM

## 2020-05-23 LAB — CMP (CANCER CENTER ONLY)
ALT: 21 U/L (ref 0–44)
AST: 20 U/L (ref 15–41)
Albumin: 3.7 g/dL (ref 3.5–5.0)
Alkaline Phosphatase: 80 U/L (ref 38–126)
Anion gap: 12 (ref 5–15)
BUN: 10 mg/dL (ref 6–20)
CO2: 23 mmol/L (ref 22–32)
Calcium: 9.8 mg/dL (ref 8.9–10.3)
Chloride: 105 mmol/L (ref 98–111)
Creatinine: 0.79 mg/dL (ref 0.44–1.00)
GFR, Est AFR Am: >60 mL/min (ref >60)
GFR, Estimated: >60 mL/min (ref >60)
Glucose, Bld: 174 mg/dL — ABNORMAL HIGH (ref 70–99)
Potassium: 3.5 mmol/L (ref 3.5–5.1)
Sodium: 140 mmol/L (ref 135–145)
Total Bilirubin: 0.3 mg/dL (ref 0.3–1.2)
Total Protein: 6.4 g/dL — ABNORMAL LOW (ref 6.5–8.1)

## 2020-05-23 LAB — CBC WITH DIFFERENTIAL/PLATELET
Abs Immature Granulocytes: 4.33 10*3/uL — ABNORMAL HIGH (ref 0.00–0.07)
Basophils Absolute: 0 10*3/uL (ref 0.0–0.1)
Basophils Relative: 0 %
Eosinophils Absolute: 0.1 10*3/uL (ref 0.0–0.5)
Eosinophils Relative: 0 %
HCT: 30.6 % — ABNORMAL LOW (ref 36.0–46.0)
Hemoglobin: 9.7 g/dL — ABNORMAL LOW (ref 12.0–15.0)
Immature Granulocytes: 16 %
Lymphocytes Relative: 3 %
Lymphs Abs: 0.8 10*3/uL (ref 0.7–4.0)
MCH: 24.7 pg — ABNORMAL LOW (ref 26.0–34.0)
MCHC: 31.7 g/dL (ref 30.0–36.0)
MCV: 77.9 fL — ABNORMAL LOW (ref 80.0–100.0)
Monocytes Absolute: 3.4 10*3/uL — ABNORMAL HIGH (ref 0.1–1.0)
Monocytes Relative: 13 %
Neutro Abs: 18.5 10*3/uL — ABNORMAL HIGH (ref 1.7–7.7)
Neutrophils Relative %: 68 %
Platelets: 148 10*3/uL — ABNORMAL LOW (ref 150–400)
RBC: 3.93 MIL/uL (ref 3.87–5.11)
RDW: 30.9 % — ABNORMAL HIGH (ref 11.5–15.5)
WBC: 27.1 10*3/uL — ABNORMAL HIGH (ref 4.0–10.5)
nRBC: 1.7 % — ABNORMAL HIGH (ref 0.0–0.2)

## 2020-05-23 LAB — LACTATE DEHYDROGENASE: LDH: 549 U/L — ABNORMAL HIGH (ref 98–192)

## 2020-05-23 LAB — URIC ACID: Uric Acid, Serum: 6.5 mg/dL (ref 2.5–7.1)

## 2020-05-23 MED ORDER — HEPARIN SOD (PORK) LOCK FLUSH 100 UNIT/ML IV SOLN
500.0000 [IU] | Freq: Once | INTRAVENOUS | Status: AC
  Administered 2020-05-23: 500 [IU]
  Filled 2020-05-23: qty 5

## 2020-05-23 MED ORDER — SODIUM CHLORIDE 0.9% FLUSH
10.0000 mL | Freq: Once | INTRAVENOUS | Status: AC
  Administered 2020-05-23: 10 mL
  Filled 2020-05-23: qty 10

## 2020-05-23 NOTE — Progress Notes (Signed)
HEMATOLOGY/ONCOLOGY CLINIC NOTE  Date of Service: 05/23/2020  Patient Care Team: [name], MD as PCP - General (Internal Medicine)  CHIEF COMPLAINTS/PURPOSE OF CONSULTATION:  F/u for continued Mx of large B cell lymphoma  HISTORY OF PRESENTING ILLNESS:  Heather [name] is a wonderful 44 y.o. female with a past medical history significant for hepatitis B, HIV, chronic anemia who presented to [place] with abdominal pain.  She had a 2-week history of early satiety, poor appetite and, 5 pound weight loss.  Abdominal pain is typically postprandial and mainly at night after dinner while laying in bed.  Has been associated with nausea improves after vomiting.  Labs on admission showed a hemoglobin of 9.3, MCV 67.4, platelet count 554,000.  Lipase was mildly elevated at 123.  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  Husband was at the bedside at the time of the visit.  The patient reports that she is having ongoing abdominal discomfort today.  Pain is primarily in her epigastric area and radiates to the left side of her abdomen.  States pain is worse when laying down after she eats.  She states that she has had this pain for at least 2 weeks.  Her abdominal pain is worse after eating.  She has had some nausea and intermittent vomiting.  Vomiting makes her pain better.  She was attributing her symptoms to some the medications that she was taking for fertility treatments.  She reports a poor appetite and about a 5 pound weight loss.  She is not having  any fevers or chills.  Denies night sweats.  She has not noticed any palpable lymphadenopathy.  She denies headaches, dizziness, chest pain, shortness of breath, diarrhea.  She reports intermittent constipation.  Denies bleeding or lower extremity edema.  The patient is married and has no children.  She currently works as an Therapist, sports.  Denies alcohol tobacco use.  Family history significant for paternal grandmother with uterine cancer.  Medical oncology was asked see the patient to make recommendations regarding her abnormal CT scan findings.  INTERVAL HISTORY: Heather [name] is a 44 year old female who is here for evaluation and management of Pancreatic mass with splenic metastases. We are joined by Mr. Fresquez. The patient's last visit with [place] was on 05/06/2020. The pt reports that she is doing well overall.  The pt reports she did have bone pain with her last Neulasta injection, but was able to mitigate most of the discomfort with Narco. She has had some runny nose and throat soreness. Pt has continued walking daily and maintaining her regular activities as much as possible. Pt has been using salt & baking soda mouth rinses regularly.   Lab results today (05/23/20) of CBC w/diff and CMP is as follows: all values are WNL except for WBC at 27.1K, Hgb at 9.7, HCT at 30.6, MCV at 77.9, MCH at 24.7, RDW at 30.9, PLT at 148K, nRBC at 1.7, Neutro Abs at 18.5K, Mono Abs at 3.4K, Abs Immature Granulocytes at 4.33K, Glucose at 174, Total Protein at 6.4. 05/23/2020 Uric acid at 6.5 05/23/2020 LDH at 549  On review of systems, pt reports  rhinorrhea, sore throat and denies fevers, chills, difficulty swallowing, constipation, SOB, chest pain, abdominal pain, black/bloody stools, nausea, pain at port site and any other symptoms.   MEDICAL HISTORY:  Past Medical History:  Diagnosis Date  . Anemia   . Diffuse large B-cell lymphoma of lymph nodes of multiple regions ([place]) 04/12/2020  . 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 History of blood transfusion    childhood  . HIV (human immunodeficiency virus infection) ([place])   . Immune deficiency disorder [facility])     SURGICAL HISTORY: Past Surgical History:  Procedure Laterality Date  . CHROMOPERTUBATION Bilateral 11/29/2016   Procedure: CHROMOPERTUBATION;  Surgeon: Waymon Amato, MD;  Location: [facility];  Service: Gynecology;  Laterality: Bilateral;  fallopian tubes  . ENDOMETRIAL BIOPSY    . IR IMAGING GUIDED PORT INSERTION  04/15/2020  . IR THORACENTESIS ASP PLEURAL SPACE W/IMG GUIDE  04/15/2020  . MYOMECTOMY N/A 11/29/2016   Procedure: MYOMECTOMY;  Surgeon: Waymon Amato, MD;  Location: [facility];  Service: Gynecology;  Laterality: N/A;    SOCIAL HISTORY: Social History   Socioeconomic History  . Marital status: Married    Spouse name: Not on file  . Number of children: Not on file  . Years of education: Not on file  . Highest education level: Not on file  Occupational History  . Not on file  Tobacco Use  . Smoking status: Never Smoker  . Smokeless tobacco: Never Used  Substance and Sexual Activity  . Alcohol use: Yes    Comment: occ  . Drug use: No  . Sexual activity: Yes  Other Topics Concern  . Not on file  Social History Narrative  . Not on file   Social Determinants of Health   Financial Resource Strain:   . Difficulty of Paying Living Expenses:   Food Insecurity:   . Worried About Charity fundraiser in the Last Year:   . Arboriculturist in the Last Year:   Transportation Needs:   . Film/video editor (Medical):   Marland Kitchen Lack of Transportation (Non-Medical):   Physical Activity:   . Days of Exercise per Week:   . Minutes of Exercise per Session:   Stress:   . Feeling of Stress :   Social Connections:   . Frequency of Communication with Friends and Family:   . Frequency of Social Gatherings with Friends and Family:   . Attends Religious Services:   . Active Member of Clubs or Organizations:   . Attends Archivist  Meetings:   Marland Kitchen Marital Status:   Intimate Partner Violence:   . Fear of Current or Ex-Partner:   . Emotionally Abused:   Marland Kitchen Physically Abused:   . Sexually Abused:     FAMILY HISTORY: Family History  Problem Relation Age of Onset  . Diabetes Mother   . Hypertension Mother     ALLERGIES:  has No Known Allergies.  MEDICATIONS:  Current Outpatient Medications  Medication Sig Dispense Refill  . acetaminophen (TYLENOL) 325 MG tablet Take 2 tablets (650 mg total) by mouth every 4 (four) hours as needed for mild pain.    . B Complex Vitamins (B COMPLEX PO) Take 1 tablet by mouth daily.    . bictegravir-emtricitabine-tenofovir AF (BIKTARVY) 50-200-25 MG TABS tablet Take 1 tablet by mouth daily with breakfast.     . cholecalciferol (VITAMIN D3) 25 MCG (1000 UNIT) tablet Take 1 tablet (1,000 Units total) by mouth at bedtime.    Marland Kitchen  feeding supplement, ENSURE ENLIVE, (ENSURE ENLIVE) LIQD Take 237 mLs by mouth 2 (two) times daily between meals. 237 mL 12  . HYDROcodone-acetaminophen (NORCO) 5-325 MG tablet Take 1 tablet by mouth every 6 (six) hours as needed for moderate pain. 30 tablet 0  . ibuprofen (ADVIL) 200 MG tablet Take 2 tablets (400 mg total) by mouth every 6 (six) hours as needed for mild pain. 30 tablet 0  . lidocaine-prilocaine (EMLA) cream Apply to affected area once (Patient taking differently: Apply 1 application topically as needed (port access). Apply to affected area once) 30 g 3  . LORazepam (ATIVAN) 0.5 MG tablet Take 1 tablet (0.5 mg total) by mouth every 6 (six) hours as needed (for chemo induced nausea or vomiting). 30 tablet 0  . Multiple Vitamin (MULTIVITAMIN WITH MINERALS) TABS tablet Take 1 tablet by mouth at bedtime.     . ondansetron (ZOFRAN) 8 MG tablet Take 1 tablet (8 mg total) by mouth every 8 (eight) hours as needed for nausea or vomiting. 20 tablet 1  . pantoprazole (PROTONIX) 40 MG tablet Take 1 tablet (40 mg total) by mouth daily for 14 days. 14 tablet 0  .  polyethylene glycol (MIRALAX / GLYCOLAX) 17 g packet Take 17 g by mouth daily as needed for mild constipation. 14 each 0  . potassium chloride SA (KLOR-CON) 20 MEQ tablet Take 1 tablet (20 mEq total) by mouth daily. Take for 5 days following hospital discharge. 5 tablet 0  . prochlorperazine (COMPAZINE) 10 MG tablet Take 1 tablet (10 mg total) by mouth every 6 (six) hours as needed for nausea or vomiting. 30 tablet 1  . Sodium Chloride-Sodium Bicarb (SODIUM BICARBONATE/SODIUM CHLORIDE) SOLN 1 application by Mouth Rinse route 4 (four) times daily.     No current facility-administered medications for this visit.    REVIEW OF SYSTEMS:   A 10+ POINT REVIEW OF SYSTEMS WAS OBTAINED including neurology, dermatology, psychiatry, cardiac, respiratory, lymph, extremities, GI, GU, Musculoskeletal, constitutional, breasts, reproductive, HEENT.  All pertinent positives are noted in the HPI.  All others are negative.    PHYSICAL EXAMINATION: ECOG PERFORMANCE STATUS: 1 - Symptomatic but completely ambulatory  . Vitals:   05/23/20 1019  BP: 115/81  Pulse: (!) 105  Resp: 18  Temp: 97.7 F (36.5 C)  SpO2: 100%   Filed Weights   05/23/20 1019  Weight: 127 lb 14.4 oz (58 kg)   .Body mass index is 24.98 kg/m.   GENERAL:alert, in no acute distress and comfortable SKIN: no acute rashes, no significant lesions EYES: conjunctiva are pink and non-injected, sclera anicteric OROPHARYNX: MMM, no exudates, no oropharyngeal erythema or ulceration NECK: supple, no JVD LYMPH:  no palpable lymphadenopathy in the cervical, axillary or inguinal regions LUNGS: clear to auscultation b/l with normal respiratory effort HEART: regular rate & rhythm ABDOMEN:  normoactive bowel sounds , non tender, not distended. No palpable hepatosplenomegaly.  Extremity: no pedal edema PSYCH: alert & oriented x 3 with fluent speech NEURO: no focal motor/sensory deficits  LABORATORY DATA:  I have reviewed the data as  listed  . CBC Latest Ref Rng & Units 05/23/2020 05/14/2020 05/13/2020  WBC 4.0 - 10.5 K/uL 27.1(H) 2.3(L) 5.0  Hemoglobin 12.0 - 15.0 g/dL 9.7(L) 10.2(L) 10.1(L)  Hematocrit 36 - 46 % 30.6(L) 32.4(L) 31.6(L)  Platelets 150 - 400 K/uL 148(L) 304 289    . CMP Latest Ref Rng & Units 05/23/2020 05/14/2020 05/13/2020  Glucose 70 - 99 mg/dL 174(H) 135(H) 126(H)  BUN 6 -  20 mg/dL 10 12 15   Creatinine 0.44 - 1.00 mg/dL 0.79 0.56 0.56  Sodium 135 - 145 mmol/L 140 133(L) 136  Potassium 3.5 - 5.1 mmol/L 3.5 3.4(L) 3.6  Chloride 98 - 111 mmol/L 105 100 100  CO2 22 - 32 mmol/L 23 27 25   Calcium 8.9 - 10.3 mg/dL 9.8 9.1 9.4  Total Protein 6.5 - 8.1 g/dL 6.4(L) 6.5 6.3(L)  Total Bilirubin 0.3 - 1.2 mg/dL 0.3 0.5 0.8  Alkaline Phos 38 - 126 U/L 80 72 74  AST 15 - 41 U/L 20 35 17  ALT 0 - 44 U/L 21 54(H) 21   04/08/2020 Pancreatic Mass Surgical Pathology Report [id number]):     RADIOGRAPHIC STUDIES: I have personally reviewed the radiological images as listed and agreed with the findings in the report. DG Chest 2 View  Result Date: 05/12/2020 CLINICAL DATA:  Chronic anemia with weight loss and decreased appetite EXAM: CHEST - 2 VIEW COMPARISON:  04/14/2020 FINDINGS: Cardiac shadows within normal limits. Right chest wall port is noted in satisfactory position. Small left pleural effusion is noted but decreased from the prior exam. Underlying atelectatic changes are noted. No bony abnormality is seen. IMPRESSION: Decrease in the degree of left-sided pleural effusion when compared with the prior exam. Electronically Signed   By: Inez Catalina M.D.   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 Hemoglobin C], childhood  malaria. Patient notes that she had a CT of the abdomen sometime in 2020 that showed no acute pathology.This was not [facility]. Not accessible to [place] at this point. She did have an MRI of the abdomen in July 2019 which showed indeterminate splenic lesions.No other acute abdominal pathology.No concern with hepatocellular carcinoma.  Patient is presenting now with  #1Pancreatic mass along with retroperitoneal lymphadenopathy and diffuse splenic lesions. No internal necrosis within the mass noted. Diffuse splenomegaly. Left periaortic lymph node causing mild displacement of the third portion of the duodenum.  Significantly elevated LDH levels. CA 19-9 and CEA levels unrevealing  # 2 Left sided large pleural effusion and lung atelectasis. S/p diagnostic and therapeutic thoracentesis -- lymphocytic predominance @ 80%  Overall picture concerning for possible  High grade B cell lymphoma vs primary pancreatic malignancy (though CA 19-9 levels indeterminate and not significantly elevated.  HIV could be risk factors for high grade EBV driven lymphomas  Patient symptomatology has developed rather quickly over the last few weeks.  #3 microcytic anemia-chronic.Some element of iron deficiency versus hemoglobinopathy versus anemia of chronic disease related to malignancy. Patient has received 1 dose of IV Feraheme Will rpt 2nd dose in 1 week.  #4 history of HIV/AIDS follows with Dr. Melynda Keller and at [facility].   PLAN: -Discussed pt labwork today, 05/23/20; blood counts show that she is responding well to last Neulasta injection, blood chemistries are stable, Uric acid is WNL, LDH is greatly elevated - likely caused by recent Neulasta injection -Advised pt that the risk of CNS recurrence is between 10-20% for patients who have Large B-cell lymphomas. The risk drops to about 5-10% after completing 4 cycles of IT MTX -Advised pt that the risks associated  with IT MTX include: spinal leak, nerve injury, drop in blood counts, or inflammation of  the spinal cord in the brain.  -The pt has no prohibitive toxicities from continuing C3 EPOCH in 1 week. Will begin IT MTX on day 2 or 3 of C3.  -Rx Valtrex -Will repeat PET/CT in 3 weeks  -Will see back in 3 weeks with labs    FOLLOW UP: Plz schedule for inpatient [facility] for 5 days starting on 05/30/2020 Plz schedule outpatient Rituxan and Udenyca on 06/06/2020 PET/CT in 3 weeks Outpatient f/u with Dr Irene Limbo with labs on 06/13/2020   The total time spent in the appt was 30 minutes and more than 50% was on counseling and direct patient cares, ordering and co-ordinating chemotherapy  All of the patient's questions were answered with apparent satisfaction. The patient knows to call the clinic with any problems, questions or concerns.   Sullivan Lone MD [place] AAHIVMS [facility] Hematology/Oncology Physician [facility]  (Office):       [phone] (Work cell):  [phone] (Fax):           [phone]  05/23/2020 11:00 AM  I, Yevette Edwards, am acting as a scribe for Dr. Sullivan Lone.   .I have reviewed the above documentation for accuracy and completeness, and I agree with the above. Brunetta Genera MD

## 2020-05-26 ENCOUNTER — Telehealth: Payer: Self-pay | Admitting: *Deleted

## 2020-05-26 NOTE — Telephone Encounter (Signed)
Patient scheduled per Dr.Kale for inpatient admission 05/30/20 for 5 day EPOCH.  Shawn in bed placement contacted with patient diagnosis and contact information. Patient will be contacted with admission time on 05/30/20. Covid screening test scheduled for 05/28/20 @ 1035 at [facility] testing site. Patient contacted, left voice mail to notify of all information and scheduled appointments

## 2020-05-28 ENCOUNTER — Other Ambulatory Visit (HOSPITAL_COMMUNITY)
Admission: RE | Admit: 2020-05-28 | Discharge: 2020-05-28 | Disposition: A | Discharge status: Home / Self Care | Payer: 59 | Source: Ambulatory Visit | Attending: Hematology | Admitting: Hematology

## 2020-05-28 DIAGNOSIS — Z01812 Encounter for preprocedural laboratory examination: Secondary | ICD-10-CM | POA: Insufficient documentation

## 2020-05-28 DIAGNOSIS — Z20822 Contact with and (suspected) exposure to covid-19: Secondary | ICD-10-CM | POA: Insufficient documentation

## 2020-05-28 LAB — SARS CORONAVIRUS 2 (TAT 6-24 HRS): SARS Coronavirus 2: NEGATIVE

## 2020-05-30 ENCOUNTER — Encounter (HOSPITAL_COMMUNITY): Payer: Self-pay | Admitting: Hematology

## 2020-05-30 ENCOUNTER — Inpatient Hospital Stay (HOSPITAL_COMMUNITY)
Admission: RE | Admit: 2020-05-30 | Discharge: 2020-06-03 | DRG: 847 | Disposition: A | Discharge status: Home / Self Care | Payer: 59 | Source: Ambulatory Visit | Attending: Hematology | Admitting: Hematology

## 2020-05-30 DIAGNOSIS — Z5111 Encounter for antineoplastic chemotherapy: Principal | ICD-10-CM

## 2020-05-30 DIAGNOSIS — C8338 Diffuse large B-cell lymphoma, lymph nodes of multiple sites: Secondary | ICD-10-CM | POA: Diagnosis present

## 2020-05-30 DIAGNOSIS — B181 Chronic viral hepatitis B without delta-agent: Secondary | ICD-10-CM | POA: Diagnosis present

## 2020-05-30 DIAGNOSIS — B20 Human immunodeficiency virus [HIV] disease: Secondary | ICD-10-CM | POA: Diagnosis present

## 2020-05-30 DIAGNOSIS — Z20822 Contact with and (suspected) exposure to covid-19: Secondary | ICD-10-CM | POA: Diagnosis present

## 2020-05-30 DIAGNOSIS — B1910 Unspecified viral hepatitis B without hepatic coma: Secondary | ICD-10-CM | POA: Diagnosis not present

## 2020-05-30 DIAGNOSIS — D509 Iron deficiency anemia, unspecified: Secondary | ICD-10-CM | POA: Diagnosis present

## 2020-05-30 DIAGNOSIS — C8330 Diffuse large B-cell lymphoma, unspecified site: Secondary | ICD-10-CM

## 2020-05-30 DIAGNOSIS — K5903 Drug induced constipation: Secondary | ICD-10-CM

## 2020-05-30 DIAGNOSIS — K5900 Constipation, unspecified: Secondary | ICD-10-CM | POA: Diagnosis present

## 2020-05-30 DIAGNOSIS — Z7189 Other specified counseling: Secondary | ICD-10-CM

## 2020-05-30 DIAGNOSIS — J910 Malignant pleural effusion: Secondary | ICD-10-CM | POA: Diagnosis present

## 2020-05-30 DIAGNOSIS — Z5112 Encounter for antineoplastic immunotherapy: Secondary | ICD-10-CM | POA: Diagnosis not present

## 2020-05-30 DIAGNOSIS — D649 Anemia, unspecified: Secondary | ICD-10-CM | POA: Diagnosis not present

## 2020-05-30 LAB — CBC WITH DIFFERENTIAL/PLATELET
Abs Immature Granulocytes: 0.33 10*3/uL — ABNORMAL HIGH (ref 0.00–0.07)
Basophils Absolute: 0.1 10*3/uL (ref 0.0–0.1)
Basophils Relative: 1 %
Eosinophils Absolute: 0.1 10*3/uL (ref 0.0–0.5)
Eosinophils Relative: 1 %
HCT: 32 % — ABNORMAL LOW (ref 36.0–46.0)
Hemoglobin: 10 g/dL — ABNORMAL LOW (ref 12.0–15.0)
Immature Granulocytes: 4 %
Lymphocytes Relative: 8 %
Lymphs Abs: 0.6 10*3/uL — ABNORMAL LOW (ref 0.7–4.0)
MCH: 25.3 pg — ABNORMAL LOW (ref 26.0–34.0)
MCHC: 31.3 g/dL (ref 30.0–36.0)
MCV: 81 fL (ref 80.0–100.0)
Monocytes Absolute: 0.9 10*3/uL (ref 0.1–1.0)
Monocytes Relative: 12 %
Neutro Abs: 5.5 10*3/uL (ref 1.7–7.7)
Neutrophils Relative %: 74 %
Platelets: 233 10*3/uL (ref 150–400)
RBC: 3.95 MIL/uL (ref 3.87–5.11)
WBC: 7.4 10*3/uL (ref 4.0–10.5)
nRBC: 0.9 % — ABNORMAL HIGH (ref 0.0–0.2)

## 2020-05-30 LAB — COMPREHENSIVE METABOLIC PANEL
ALT: 20 U/L (ref 0–44)
AST: 17 U/L (ref 15–41)
Albumin: 3.7 g/dL (ref 3.5–5.0)
Alkaline Phosphatase: 83 U/L (ref 38–126)
Anion gap: 10 (ref 5–15)
BUN: 11 mg/dL (ref 6–20)
CO2: 27 mmol/L (ref 22–32)
Calcium: 9.7 mg/dL (ref 8.9–10.3)
Chloride: 101 mmol/L (ref 98–111)
Creatinine, Ser: 0.53 mg/dL (ref 0.44–1.00)
GFR calc Af Amer: >60 mL/min (ref >60)
GFR calc non Af Amer: >60 mL/min (ref >60)
Glucose, Bld: 87 mg/dL (ref 70–99)
Potassium: 3.8 mmol/L (ref 3.5–5.1)
Sodium: 138 mmol/L (ref 135–145)
Total Bilirubin: 0.3 mg/dL (ref 0.3–1.2)
Total Protein: 6.7 g/dL (ref 6.5–8.1)

## 2020-05-30 LAB — PREGNANCY, URINE: Preg Test, Ur: NEGATIVE

## 2020-05-30 MED ORDER — PROCHLORPERAZINE MALEATE 10 MG PO TABS: 10.0000 mg | ORAL_TABLET | Freq: Four times a day (QID) | ORAL | Status: DC | PRN

## 2020-05-30 MED ORDER — PANTOPRAZOLE SODIUM 40 MG PO TBEC
40.0000 mg | DELAYED_RELEASE_TABLET | Freq: Every day | ORAL | Status: DC
  Administered 2020-05-30 – 2020-06-03 (×5): 40 mg via ORAL
  Filled 2020-05-30 (×6): qty 1

## 2020-05-30 MED ORDER — LIDOCAINE-PRILOCAINE 2.5-2.5 % EX CREA: 1.0000 "application " | TOPICAL_CREAM | CUTANEOUS | Status: DC | PRN

## 2020-05-30 MED ORDER — LORAZEPAM 0.5 MG PO TABS: 0.5000 mg | ORAL_TABLET | Freq: Four times a day (QID) | ORAL | Status: DC | PRN

## 2020-05-30 MED ORDER — VINCRISTINE SULFATE CHEMO INJECTION 1 MG/ML
Freq: Once | INTRAVENOUS | Status: AC
  Filled 2020-05-30: qty 8

## 2020-05-30 MED ORDER — SODIUM CHLORIDE 0.9 % IV SOLN
Freq: Once | INTRAVENOUS | Status: AC
  Administered 2020-05-30: 18 mg via INTRAVENOUS
  Filled 2020-05-30: qty 4

## 2020-05-30 MED ORDER — SODIUM CHLORIDE 0.9% FLUSH
10.0000 mL | Freq: Two times a day (BID) | INTRAVENOUS | Status: DC
  Administered 2020-05-30 – 2020-06-01 (×2): 10 mL

## 2020-05-30 MED ORDER — CHLORHEXIDINE GLUCONATE CLOTH 2 % EX PADS
6.0000 | MEDICATED_PAD | Freq: Every day | CUTANEOUS | Status: DC
  Administered 2020-05-30 – 2020-06-02 (×4): 6 via TOPICAL

## 2020-05-30 MED ORDER — POLYETHYLENE GLYCOL 3350 17 G PO PACK: 17.0000 g | PACK | Freq: Every day | ORAL | Status: DC | PRN

## 2020-05-30 MED ORDER — HYDROCODONE-ACETAMINOPHEN 5-325 MG PO TABS: 1.0000 | ORAL_TABLET | Freq: Four times a day (QID) | ORAL | Status: DC | PRN

## 2020-05-30 MED ORDER — SODIUM BICARBONATE/SODIUM CHLORIDE MOUTHWASH
1.0000 "application " | Freq: Four times a day (QID) | OROMUCOSAL | Status: DC
  Administered 2020-05-30 – 2020-06-02 (×14): 1 via OROMUCOSAL
  Filled 2020-05-30: qty 1000

## 2020-05-30 MED ORDER — PREDNISONE 20 MG PO TABS
60.0000 mg | ORAL_TABLET | Freq: Every day | ORAL | Status: AC
  Administered 2020-05-30 – 2020-06-03 (×5): 60 mg via ORAL
  Filled 2020-05-30 (×7): qty 3

## 2020-05-30 MED ORDER — ACETAMINOPHEN 325 MG PO TABS: 650.0000 mg | ORAL_TABLET | ORAL | Status: DC | PRN

## 2020-05-30 MED ORDER — POTASSIUM CHLORIDE CRYS ER 20 MEQ PO TBCR
20.0000 meq | EXTENDED_RELEASE_TABLET | Freq: Once | ORAL | Status: AC
  Administered 2020-05-30: 20 meq via ORAL
  Filled 2020-05-30: qty 1

## 2020-05-30 MED ORDER — BICTEGRAVIR-EMTRICITAB-TENOFOV 50-200-25 MG PO TABS
1.0000 | ORAL_TABLET | Freq: Every day | ORAL | Status: DC
  Administered 2020-05-31 – 2020-06-03 (×4): 1 via ORAL
  Filled 2020-05-30 (×5): qty 1

## 2020-05-30 MED ORDER — ONDANSETRON HCL 8 MG PO TABS: 8.0000 mg | ORAL_TABLET | Freq: Three times a day (TID) | ORAL | Status: DC | PRN

## 2020-05-30 MED ORDER — ADULT MULTIVITAMIN W/MINERALS CH
1.0000 | ORAL_TABLET | Freq: Every day | ORAL | Status: DC
  Administered 2020-05-30 – 2020-06-02 (×4): 1 via ORAL
  Filled 2020-05-30 (×4): qty 1

## 2020-05-30 MED ORDER — ENSURE ENLIVE PO LIQD
237.0000 mL | Freq: Two times a day (BID) | ORAL | Status: DC
  Administered 2020-05-30 – 2020-06-03 (×7): 237 mL via ORAL

## 2020-05-30 MED ORDER — VITAMIN D 25 MCG (1000 UNIT) PO TABS
1000.0000 [IU] | ORAL_TABLET | Freq: Every day | ORAL | Status: DC
  Administered 2020-05-30 – 2020-06-02 (×4): 1000 [IU] via ORAL
  Filled 2020-05-30 (×4): qty 1

## 2020-05-30 MED ORDER — SODIUM CHLORIDE 0.9% FLUSH: 10.0000 mL | INTRAVENOUS | Status: DC | PRN

## 2020-05-30 NOTE — H&P (Addendum)
[place]  Telephone:([phone] Fax:([phone]) [phone]   MEDICAL ONCOLOGY - ADMISSION H&P  Reason for Referral: Cycle #3 EPOCH-R  HPI: Ms. Durio is a wonderful 44 y.o. female with a past medical history significant for hepatitis B, HIV, chronic anemia who presented to [place] with abdominal pain. She had a 2-week history of early satiety, poor appetite and, 5 pound weight loss. Abdominal pain is typically postprandial and mainly at night after dinner while laying in bed. Has been associated with nausea improves after vomiting. Labs on admission showed a hemoglobin of 9.3, MCV 67.4, platelet count 554,000. Lipase was mildly elevated at 123. 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  During her initial consult, she was having ongoingpain is primarily in her epigastric area and radiates to the left side of her abdomen. States pain is worse when laying down after she eats. She states that she has had this pain for at least 2 weeks. Her abdominal pain is worse after eating. She has had some nausea and intermittent vomiting. Vomiting makes her pain better. She was attributing her symptoms to some the medications that she was taking for fertility treatments. She reports a poor appetite and about a 5 pound weight loss. She is not having any fevers or chills. Denies night sweats. She has not noticed any palpable lymphadenopathy. The patient underwent a biopsy of her pancreatic mass on 04/08/2020 ([id number]) with results feeling diffuse  large B-cell lymphoma.  It was recommended for her to undergo systemic chemotherapy with EPOCH-R.   The patient reports that she is feeling well overall today.  She reported having some mouth sores at her visit at the cancer next week but these have now resolved.  Reports a good appetite.  Denies abdominal pain, nausea, vomiting.  Denies chest pain or shortness of breath.  No bleeding reported.  Appetite is significantly improved. She is here for admission for cycle #3 of EPOCH-R.      Past Medical History:  Diagnosis Date  . Anemia   . Diffuse large B-cell lymphoma of lymph nodes of multiple regions ([place]) 04/12/2020  . 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 History of blood transfusion    childhood  . HIV (human immunodeficiency virus infection) ([place])   . Immune deficiency disorder ([place])                            04/15/2020  . MYOMECTOMY N/A 11/29/2016   Procedure: MYOMECTOMY;  Surgeon: Waymon Amato, MD;  Location: [facility];  Service: Gynecology;  Laterality: N/A;    Medication Dose Route Frequency Provider Last Rate Last Admin  . acetaminophen (TYLENOL) tablet 650 mg  650 mg Oral Q4H PRN Maryanna Shape, NP      . Derrill Memo ON 05/31/2020] bictegravir-emtricitabine-tenofovir AF (BIKTARVY) 50-200-25 MG per tablet 1 tablet  1 tablet Oral Q breakfast Curcio, Roselie Awkward, NP      . Chlorhexidine Gluconate Cloth 2 % PADS 6 each  6 each Topical Daily Curcio, Kristin R, NP      . cholecalciferol (VITAMIN D3) tablet 1,000  Units  1,000 Units Oral QHS Curcio, Kristin R, NP      . feeding supplement (ENSURE ENLIVE) (ENSURE ENLIVE) liquid 237 mL  237 mL Oral BID BM Curcio, Kristin R, NP      . HYDROcodone-acetaminophen (NORCO/VICODIN) 5-325 MG per tablet 1 tablet  1 tablet Oral Q6H PRN Curcio, Roselie Awkward, NP      . lidocaine-prilocaine (EMLA) cream 1 application  1 application Topical PRN Curcio, Roselie Awkward, NP      . LORazepam (ATIVAN) tablet 0.5 mg  0.5 mg Oral Q6H PRN Curcio, Roselie Awkward, NP      .  multivitamin with minerals tablet 1 tablet  1 tablet Oral QHS Curcio, Kristin R, NP      . ondansetron (ZOFRAN) tablet 8 mg  8 mg Oral Q8H PRN Curcio, Kristin R, NP      . pantoprazole (PROTONIX) EC tablet 40 mg  40 mg Oral Daily Curcio, Kristin R, NP      . polyethylene glycol (MIRALAX / GLYCOLAX) packet 17 g  17 g Oral Daily PRN Curcio, Roselie Awkward, NP      . potassium chloride SA (KLOR-CON) CR tablet 20 mEq  20 mEq Oral Once Mikey Bussing R, NP      . prochlorperazine (COMPAZINE) tablet 10 mg  10 mg Oral Q6H PRN Curcio, Kristin R, NP      . sodium bicarbonate/sodium chloride mouthwash 1610RU  1 application Mouth Rinse QID Curcio, Kristin R, NP      . sodium chloride flush (NS) 0.9 % injection 10-40 mL  10-40 mL Intracatheter Q12H Curcio, Kristin R, NP      . sodium chloride flush (NS) 0.9 % injection 10-40 mL  10-40 mL Intracatheter PRN Curcio, Roselie Awkward, NP           Family History  Problem Relation Age of Onset  . Diabetes Mother   . Hypertension Mother   Relation Age of Onset  . Diabetes Mother   . Hypertension Mother      Socioeconomic History  . Marital status: Married    Spouse name: Not on file  . Number of children: Not on file  . Years of education: Not on file  . Highest education level: Not on file  Occupational History  . Not on file  Tobacco Use  . Smoking status: Never Smoker  . Smokeless tobacco: Never Used  Substance and Sexual Activity  . Alcohol use: Yes    Comment: occ  . Drug use: No  . Sexual activity: Yes  Other Topics Concern  . Not on file  Social History Narrative  . Not on file   Social Determinants of Health   Financial Resource Strain:   . Difficulty of Paying Living Expenses:   Food Insecurity:   . Worried About Charity fundraiser in the Last Year:   . Arboriculturist in the Last Year:   Transportation Needs:   . Film/video editor (Medical):   Marland Kitchen Lack of Transportation (Non-Medical):   Physical Activity:   . Days of Exercise  per Week:   . Minutes of Exercise per Session:   Stress:   . Feeling of Stress :   Social Connections:   . Frequency of Communication with Friends and Family:   . Frequency of Social Gatherings with Friends and Family:   . Attends Religious Services:   . Active Member of Clubs or Organizations:   . Attends Archivist Meetings:   .  Marital Status:   Intimate Partner Violence:   . Fear of Current or Ex-Partner:   . Emotionally Abused:   Marland Kitchen Physically Abused:   . Sexually Abused:   :  Review of Systems: A comprehensive 14 point review of systems was negative except as noted in the HPI.  Exam: Patient Vitals for the past 24 hrs:  BP Temp Temp src Pulse Resp SpO2 Height Weight  05/30/20 1127 119/85 98.3 F (36.8 C) Oral 93 16 99 % 5' (1.524 m) 58.3 kg    General:  well-nourished in no acute distress.   Eyes:  no scleral icterus.   ENT:  There were no oropharyngeal lesions.   Neck was without thyromegaly.   Lymphatics:  Negative cervical, supraclavicular or axillary adenopathy.   Respiratory: Minutes left base. Cardiovascular:  Regular rate and rhythm, S1/S2, without murmur, rub or gallop.  There was no pedal edema.   GI:  abdomen was soft, flat, nontender, nondistended, without organomegaly.   Musculoskeletal:  no spinal tenderness of palpation of vertebral spine.   Skin: No bruising or petechiae.  Keloids noted over her chest and abdomen. Neuro exam was nonfocal. Patient was alert and oriented.  Attention was good.   Language was appropriate.  Mood was normal without depression.  Speech was not pressured.  Thought content was not tangential.     Lab Results  Component Value Date   WBC 7.4 05/30/2020   HGB 10.0 (L) 05/30/2020   HCT 32.0 (L) 05/30/2020   PLT 233 05/30/2020   GLUCOSE 87 05/30/2020   ALT 20 05/30/2020   AST 17 05/30/2020   NA 138 05/30/2020   K 3.8 05/30/2020   CL 101 05/30/2020   CREATININE 0.53 05/30/2020   BUN 11 05/30/2020   CO2 27  05/30/2020    DG Chest 2 View  Result Date: 05/12/2020 CLINICAL DATA:  Chronic anemia with weight loss and decreased appetite EXAM: CHEST - 2 VIEW COMPARISON:  04/14/2020 FINDINGS: Cardiac shadows within normal limits. Right chest wall port is noted in satisfactory position. Small left pleural effusion is noted but decreased from the prior exam. Underlying atelectatic changes are noted. No bony abnormality is seen. IMPRESSION: Decrease in the degree of left-sided pleural effusion when compared with the prior exam. Electronically Signed   By: Inez Catalina M.D.   On: 05/12/2020 19:01     DG Chest 2 View  Result Date: 05/12/2020 CLINICAL DATA:  Chronic anemia with weight loss and decreased appetite EXAM: CHEST - 2 VIEW COMPARISON:  04/14/2020 FINDINGS: Cardiac shadows within normal limits. Right chest wall port is noted in satisfactory position. Small left pleural effusion is noted but decreased from the prior exam. Underlying atelectatic changes are noted. No bony abnormality is seen. IMPRESSION: Decrease in the degree of left-sided pleural effusion when compared with the prior exam. Electronically Signed   By: Inez Catalina M.D.   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 Hemoglobin C], childhood malaria. Patient notes that she had a CT of the abdomen sometime in 2020 that showed no acute pathology.This was not [facility]. Not accessible to [place] at this point. She did have an MRI of the abdomen in July 2019 which showed indeterminate splenic lesions.No other acute abdominal  pathology.No concern with hepatocellular carcinoma.  Patient is presenting now with  #1Pancreatic massalong with retroperitoneal  lymphadenopathy and diffuse splenic lesions. No internal necrosis within the mass noted. Diffuse splenomegaly. Left periaortic lymph node causing mild displacement of the third portion of the duodenum.  Significantly elevated LDH levels. 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  # 2 Left sided large pleural effusion and lung atelectasis. S/p diagnostic and therapeutic thoracentesis -- lymphocytic predominance @ 80%  HIV could be risk factors for high grade EBV driven lymphomas  Patient symptomatology has developed rather quickly over the last few weeks.  #32microcytic anemia-chronic.Some element of iron deficiency versus hemoglobinopathy versus anemia of chronic disease related to malignancy. Patient has received 1 dose of IV Feraheme on 04/08/2020 and repeated on 04/22/2020  #4history of HIV/AIDS follows with Dr. Melynda Keller and at [facility].   PLAN: -Obtain stat CBC with differential, CMET, and urine pregnancy test today prior to chemotherapy. -Check daily CBC with differential and CMET -Continue as needed antiemetics. -As needed MiraLAX has been ordered. -Will need to be cautious with rituximab to prevent recurrent hepatitis B flare.  Repeat HBV DNA undetectable on 04/13/2020. -continue with Biktarvy - no interactions - this was confirmed with patient ID physician and our pharmacy. -We will begin intrathecal methotrexate with this cycle of chemotherapy.  The patient is scheduled the cancer center on 06/06/2020 for Rituxan and Neulasta and will return for a follow-up visit on 06/13/2020.  Mikey Bussing, DNP, AGPCNP-BC, AOCNP   ADDENDUM  .Patient was Personally and independently interviewed, examined and relevant elements of the history of present illness were reviewed in details and an assessment and plan was created. All elements of the patient's history of present illness ,  assessment and plan were discussed in details with Mikey Bussing, DNP, AGPCNP-BC, AOCNP. The above documentation reflects our combined findings assessment and plan.  Sullivan Lone MD MS

## 2020-05-31 ENCOUNTER — Inpatient Hospital Stay (HOSPITAL_COMMUNITY): Payer: 59

## 2020-05-31 ENCOUNTER — Other Ambulatory Visit: Payer: Self-pay

## 2020-05-31 DIAGNOSIS — C8330 Diffuse large B-cell lymphoma, unspecified site: Secondary | ICD-10-CM | POA: Diagnosis not present

## 2020-05-31 DIAGNOSIS — Z5111 Encounter for antineoplastic chemotherapy: Secondary | ICD-10-CM | POA: Diagnosis not present

## 2020-05-31 LAB — CSF CELL COUNT WITH DIFFERENTIAL
RBC Count, CSF: 0 /mm3
Tube #: 4
WBC, CSF: 1 /mm3 (ref 0–5)

## 2020-05-31 LAB — CBC WITH DIFFERENTIAL/PLATELET
Abs Immature Granulocytes: 0.47 10*3/uL — ABNORMAL HIGH (ref 0.00–0.07)
Basophils Absolute: 0 10*3/uL (ref 0.0–0.1)
Basophils Relative: 0 %
Eosinophils Absolute: 0 10*3/uL (ref 0.0–0.5)
Eosinophils Relative: 0 %
HCT: 33.2 % — ABNORMAL LOW (ref 36.0–46.0)
Hemoglobin: 10.3 g/dL — ABNORMAL LOW (ref 12.0–15.0)
Immature Granulocytes: 5 %
Lymphocytes Relative: 4 %
Lymphs Abs: 0.4 10*3/uL — ABNORMAL LOW (ref 0.7–4.0)
MCH: 25.1 pg — ABNORMAL LOW (ref 26.0–34.0)
MCHC: 31 g/dL (ref 30.0–36.0)
MCV: 81 fL (ref 80.0–100.0)
Monocytes Absolute: 0.2 10*3/uL (ref 0.1–1.0)
Monocytes Relative: 2 %
Neutro Abs: 8.9 10*3/uL — ABNORMAL HIGH (ref 1.7–7.7)
Neutrophils Relative %: 89 %
Platelets: 239 10*3/uL (ref 150–400)
RBC: 4.1 MIL/uL (ref 3.87–5.11)
WBC: 9.9 10*3/uL (ref 4.0–10.5)
nRBC: 0.2 % (ref 0.0–0.2)

## 2020-05-31 LAB — COMPREHENSIVE METABOLIC PANEL
ALT: 20 U/L (ref 0–44)
AST: 17 U/L (ref 15–41)
Albumin: 3.5 g/dL (ref 3.5–5.0)
Alkaline Phosphatase: 84 U/L (ref 38–126)
Anion gap: 9 (ref 5–15)
BUN: 12 mg/dL (ref 6–20)
CO2: 25 mmol/L (ref 22–32)
Calcium: 9.8 mg/dL (ref 8.9–10.3)
Chloride: 103 mmol/L (ref 98–111)
Creatinine, Ser: 0.44 mg/dL (ref 0.44–1.00)
GFR calc Af Amer: >60 mL/min (ref >60)
GFR calc non Af Amer: >60 mL/min (ref >60)
Glucose, Bld: 161 mg/dL — ABNORMAL HIGH (ref 70–99)
Potassium: 4 mmol/L (ref 3.5–5.1)
Sodium: 137 mmol/L (ref 135–145)
Total Bilirubin: 0.4 mg/dL (ref 0.3–1.2)
Total Protein: 7 g/dL (ref 6.5–8.1)

## 2020-05-31 LAB — PROTEIN, CSF: Total  Protein, CSF: 26 mg/dL (ref 15–45)

## 2020-05-31 LAB — GLUCOSE, CSF: Glucose, CSF: 100 mg/dL — ABNORMAL HIGH (ref 40–70)

## 2020-05-31 MED ORDER — VINCRISTINE SULFATE CHEMO INJECTION 1 MG/ML
Freq: Once | INTRAVENOUS | Status: AC
  Filled 2020-05-31: qty 8

## 2020-05-31 MED ORDER — SODIUM CHLORIDE 0.9 % IV SOLN
Freq: Once | INTRAVENOUS | Status: AC
  Administered 2020-05-31: 14 mg via INTRAVENOUS
  Filled 2020-05-31: qty 4

## 2020-05-31 MED ORDER — SODIUM CHLORIDE (PF) 0.9 % IJ SOLN
Freq: Once | INTRAMUSCULAR | Status: AC
  Filled 2020-05-31: qty 0.48

## 2020-05-31 MED ORDER — ACETAMINOPHEN 325 MG PO TABS
650.0000 mg | ORAL_TABLET | Freq: Once | ORAL | Status: AC
  Administered 2020-05-31: 650 mg via ORAL
  Filled 2020-05-31: qty 2

## 2020-05-31 MED ORDER — SENNOSIDES-DOCUSATE SODIUM 8.6-50 MG PO TABS
2.0000 | ORAL_TABLET | Freq: Every day | ORAL | Status: DC
  Administered 2020-05-31 – 2020-06-02 (×3): 2 via ORAL
  Filled 2020-05-31 (×3): qty 2

## 2020-05-31 NOTE — Plan of Care (Signed)
  Problem: Coping: Goal: Ability to identify and develop effective coping behavior will improve Outcome: Progressing   Problem: Nutritional: Goal: Maintenance of adequate nutrition will improve Outcome: Progressing

## 2020-05-31 NOTE — Progress Notes (Signed)
Chemo doses, dilutions,  and calculations verified by 2 chemo RNs.

## 2020-05-31 NOTE — Progress Notes (Addendum)
HEMATOLOGY-ONCOLOGY PROGRESS NOTE  SUBJECTIVE:   Patient is tolerated cycle 3-day 1 of EPOCH-R chemotherapy well.  Labs are stable.  No shortness of breath.  No nausea vomiting or diarrhea. Constipation controlled with current laxatives.  No mouth soreness.  Has been using her salt and baking soda mouthwashes. Good appetite and p.o. intake.   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[place])  04/12/2020 Initial Diagnosis   Diffuse large B-cell lymphoma of lymph nodes of multiple regions ([place])   04/18/2020 -  Chemotherapy   The patient had dexamethasone (DECADRON) 4 MG tablet, 8 mg, Oral, 2 times daily with meals, 1 of 1 cycle, Start date: --, End date: -- DOXOrubicin (ADRIAMYCIN) 16 mg, etoposide (VEPESID) 80 mg, vinCRIStine (ONCOVIN) 0.6 mg in sodium chloride 0.9 % 1,000 mL chemo infusion, , Intravenous, Once, 3 of 6 cycles Administration:  (04/18/2020),  (04/19/2020),  (05/10/2020),  (05/11/2020),  (04/20/2020),  (04/21/2020),  (05/12/2020),  (05/13/2020) ondansetron (ZOFRAN) 8 mg, dexamethasone (DECADRON) 10 mg in sodium chloride 0.9 % 50 mL IVPB, , Intravenous,  Once, 3 of 6 cycles Administration: 8 mg (04/18/2020), 18 mg (04/19/2020), 36 mg (04/22/2020), 18 mg (05/10/2020), 15 mg (05/11/2020), 8 mg (05/14/2020), 8 mg (04/20/2020), 18 mg (04/21/2020), 18 mg (05/12/2020), 18 mg (05/13/2020) methotrexate (PF) 12 mg, hydrocortisone sodium succinate (SOLU-CORTEF) 50 mg in sodium chloride (PF) 0.9 % INTRATHECAL chemo injection, , Intrathecal,  Once, 1 of 4 cycles cyclophosphamide (CYTOXAN) 1,180 mg in sodium chloride 0.9 % 250 mL chemo infusion, 750 mg/m2 = 1,180 mg, Intravenous,  Once, 3 of 6 cycles Administration: 1,180 mg (04/22/2020), 1,180 mg (05/14/2020)  for chemotherapy treatment.    04/25/2020 -  Chemotherapy   The patient had pegfilgrastim-cbqv [facility]) injection 6 mg, 6 mg, Subcutaneous, Once, 2 of 6 cycles Administration: 6 mg (04/25/2020), 6 mg (05/16/2020) riTUXimab-pvvr  (RUXIENCE) 600 mg in sodium chloride 0.9 % 250 mL (1.9355 mg/mL) infusion, 375 mg/m2 = 600 mg, Intravenous,  Once, 1 of 1 cycle Administration: 600 mg (04/25/2020)  for chemotherapy treatment.       REVIEW OF SYSTEMS:   10 Point review of Systems was done is negative except as noted above.   I have reviewed the past medical history, past surgical history, social history and family history with the patient and they are unchanged from previous note.   PHYSICAL EXAMINATION: ECOG PERFORMANCE STATUS: 1 - Symptomatic but completely ambulatory  Vitals:   05/30/20 2358 05/31/20 0527  BP: 115/73 121/81  Pulse: 89 77  Resp: 18 18  Temp: 97.8 F (36.6 C) 98.1 F (36.7 C)  SpO2: 98% 100%   Filed Weights   05/30/20 1127  Weight: 58.3 kg    Intake/Output from previous day: 07/26 0701 - 07/27 0700 In: 1020.9 [P.O.:340; IV Piggyback:680.9] Out: -   GENERAL:alert, in no acute distress and comfortable SKIN: no acute rashes, no significant lesions EYES: conjunctiva are pink and non-injected, sclera anicteric OROPHARYNX: MMM, no exudates, no oropharyngeal erythema or ulceration NECK: supple, no JVD LYMPH:  no palpable lymphadenopathy in the cervical, axillary or inguinal regions LUNGS: decreased air entry left lung base. HEART: regular rate & rhythm ABDOMEN:  normoactive bowel sounds , non tender, not distended. Extremity: no pedal edema PSYCH: alert & oriented x 3 with fluent speech NEURO: no focal motor/sensory deficits   LABORATORY DATA:  I have reviewed the data as listed CMP Latest Ref Rng & Units 05/31/2020 05/30/2020 05/23/2020  Glucose 70 - 99 mg/dL 161(H) 87  174(H)  BUN 6 - 20 mg/dL 12 11 10   Creatinine 0.44 - 1.00 mg/dL 0.44 0.53 0.79  Sodium 135 - 145 mmol/L 137 138 140  Potassium 3.5 - 5.1 mmol/L 4.0 3.8 3.5  Chloride 98 - 111 mmol/L 103 101 105  CO2 22 - 32 mmol/L 25 27 23   Calcium 8.9 - 10.3 mg/dL 9.8 9.7 9.8  Total Protein 6.5 - 8.1 g/dL 7.0 6.7 6.4(L)  Total  Bilirubin 0.3 - 1.2 mg/dL 0.4 0.3 0.3  Alkaline Phos 38 - 126 U/L 84 83 80  AST 15 - 41 U/L 17 17 20   ALT 0 - 44 U/L 20 20 21     Lab Results  Component Value Date   WBC 9.9 05/31/2020   HGB 10.3 (L) 05/31/2020   HCT 33.2 (L) 05/31/2020   MCV 81.0 05/31/2020   PLT 239 05/31/2020   NEUTROABS 8.9 (H) 05/31/2020    DG Chest 2 View  Result Date: 05/12/2020 CLINICAL DATA:  Chronic anemia with weight loss and decreased appetite EXAM: CHEST - 2 VIEW COMPARISON:  04/14/2020 FINDINGS: Cardiac shadows within normal limits. Right chest wall port is noted in satisfactory position. Small left pleural effusion is noted but decreased from the prior exam. Underlying atelectatic changes are noted. No bony abnormality is seen. IMPRESSION: Decrease in the degree of left-sided pleural effusion when compared with the prior exam. Electronically Signed   By: Inez Catalina M.D.   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 Hemoglobin C], childhood malaria. Patient notes that she had a CT of the abdomen sometime in 2020 that showed no acute pathology.This was not [facility]. Not accessible to [place] at this point. She did have an MRI of the abdomen in July 2019 which showed indeterminate splenic lesions.No other acute abdominal pathology.No concern with hepatocellular carcinoma.  Patient is presenting now with  #1Pancreatic massalong with retroperitoneal lymphadenopathy and diffuse splenic lesions. No internal necrosis within the mass noted. Diffuse splenomegaly. Left periaortic lymph node causing mild displacement of the third portion of the duodenum.  Significantly elevated LDH levels. CA 19-9 and CEA levels  unrevealing  Pancreatic mass biopsy consistent with diffuse large B-cell lymphoma.  # 2 Left sided large pleural effusion and lung atelectasis. S/p diagnostic and therapeutic thoracentesis -- lymphocytic predominance @ 80%  HIV could be risk factors for high grade EBV driven lymphomas  Patient symptomatology has developed rather quickly over the last few weeks.  #74microcytic anemia-chronic.Some element of iron deficiency versus hemoglobinopathy versus anemia of chronic disease related to malignancy. Patient has received 1 dose of IV Feraheme on 04/08/2020 and repeated on 04/22/2020  #4history of HIV/AIDS follows with Dr. Melynda Keller and at [facility].   PLAN: -Labs are stable and patient has no symptoms of prohibitive toxicities from her chemotherapy and is stable to get cycle 3-day 2 of EPOCH-R -Continue as needed antiemetics. -Continue MiraLAX daily -Will need to be cautious with rituximab to prevent recurrent hepatitis B flare.  Repeat HBV DNA undetectable on 04/13/2020. -continue Biktarvy for HIV and Hep B control -She will have intrathecal methotrexate for CNS prophylaxis today.   The patient is scheduled at the cath center on 06/06/2020 for rituximab and Neulasta and will return for labs  and a follow-up visit on 06/13/2020.  Mikey Bussing, DNP, AGPCNP-BC, AOCNP Mon/Tues/Thurs/Fri 7am-5pm; Off Wednesdays Cell: [phone]   ADDENDUM  .Patient was Personally and independently interviewed, examined and relevant elements of the history of present illness were reviewed in details and an assessment and plan was created. All elements of the patient's history of present illness , assessment and plan were discussed in details with Mikey Bussing, DNP, AGPCNP-BC, AOCNP. The above documentation reflects our combined findings assessment and plan.  Sullivan Lone MD MS

## 2020-06-01 ENCOUNTER — Telehealth: Payer: Self-pay | Admitting: Hematology

## 2020-06-01 DIAGNOSIS — C8330 Diffuse large B-cell lymphoma, unspecified site: Secondary | ICD-10-CM | POA: Diagnosis not present

## 2020-06-01 DIAGNOSIS — Z5111 Encounter for antineoplastic chemotherapy: Secondary | ICD-10-CM | POA: Diagnosis not present

## 2020-06-01 LAB — CBC WITH DIFFERENTIAL/PLATELET
Abs Immature Granulocytes: 0.15 10*3/uL — ABNORMAL HIGH (ref 0.00–0.07)
Basophils Absolute: 0 10*3/uL (ref 0.0–0.1)
Basophils Relative: 0 %
Eosinophils Absolute: 0 10*3/uL (ref 0.0–0.5)
Eosinophils Relative: 0 %
HCT: 33 % — ABNORMAL LOW (ref 36.0–46.0)
Hemoglobin: 10.2 g/dL — ABNORMAL LOW (ref 12.0–15.0)
Immature Granulocytes: 2 %
Lymphocytes Relative: 3 %
Lymphs Abs: 0.3 10*3/uL — ABNORMAL LOW (ref 0.7–4.0)
MCH: 25.2 pg — ABNORMAL LOW (ref 26.0–34.0)
MCHC: 30.9 g/dL (ref 30.0–36.0)
MCV: 81.5 fL (ref 80.0–100.0)
Monocytes Absolute: 0.4 10*3/uL (ref 0.1–1.0)
Monocytes Relative: 4 %
Neutro Abs: 9.1 10*3/uL — ABNORMAL HIGH (ref 1.7–7.7)
Neutrophils Relative %: 91 %
Platelets: 279 10*3/uL (ref 150–400)
RBC: 4.05 MIL/uL (ref 3.87–5.11)
WBC: 9.9 10*3/uL (ref 4.0–10.5)
nRBC: 0.4 % — ABNORMAL HIGH (ref 0.0–0.2)

## 2020-06-01 LAB — COMPREHENSIVE METABOLIC PANEL
ALT: 17 U/L (ref 0–44)
AST: 15 U/L (ref 15–41)
Albumin: 3.6 g/dL (ref 3.5–5.0)
Alkaline Phosphatase: 71 U/L (ref 38–126)
Anion gap: 6 (ref 5–15)
BUN: 15 mg/dL (ref 6–20)
CO2: 26 mmol/L (ref 22–32)
Calcium: 9.6 mg/dL (ref 8.9–10.3)
Chloride: 106 mmol/L (ref 98–111)
Creatinine, Ser: 0.51 mg/dL (ref 0.44–1.00)
GFR calc Af Amer: >60 mL/min (ref >60)
GFR calc non Af Amer: >60 mL/min (ref >60)
Glucose, Bld: 162 mg/dL — ABNORMAL HIGH (ref 70–99)
Potassium: 3.7 mmol/L (ref 3.5–5.1)
Sodium: 138 mmol/L (ref 135–145)
Total Bilirubin: 0.4 mg/dL (ref 0.3–1.2)
Total Protein: 6.7 g/dL (ref 6.5–8.1)

## 2020-06-01 LAB — CYTOLOGY - NON PAP

## 2020-06-01 LAB — FLOW CYTOMETRY REQUEST - FLUID (INPATIENT): Lymphocyte subsets: UNDETERMINED

## 2020-06-01 MED ORDER — POTASSIUM CHLORIDE CRYS ER 20 MEQ PO TBCR
20.0000 meq | EXTENDED_RELEASE_TABLET | Freq: Two times a day (BID) | ORAL | Status: DC
  Administered 2020-06-01 – 2020-06-03 (×5): 20 meq via ORAL
  Filled 2020-06-01 (×5): qty 1

## 2020-06-01 MED ORDER — SODIUM CHLORIDE 0.9 % IV SOLN
Freq: Once | INTRAVENOUS | Status: AC
  Administered 2020-06-01: 68 mg via INTRAVENOUS
  Filled 2020-06-01: qty 4

## 2020-06-01 MED ORDER — VINCRISTINE SULFATE CHEMO INJECTION 1 MG/ML
Freq: Once | INTRAVENOUS | Status: AC
  Filled 2020-06-01: qty 8

## 2020-06-01 MED ORDER — ENOXAPARIN SODIUM 40 MG/0.4ML ~~LOC~~ SOLN
40.0000 mg | SUBCUTANEOUS | Status: DC
  Administered 2020-06-01 – 2020-06-02 (×2): 40 mg via SUBCUTANEOUS
  Filled 2020-06-01 (×2): qty 0.4

## 2020-06-01 MED ORDER — POLYETHYLENE GLYCOL 3350 17 G PO PACK
17.0000 g | PACK | Freq: Every day | ORAL | Status: DC
  Administered 2020-06-01 – 2020-06-03 (×3): 17 g via ORAL
  Filled 2020-06-01 (×3): qty 1

## 2020-06-01 NOTE — Telephone Encounter (Signed)
Scheduled per 07/19 los, patient has been called and voicemail was left. 

## 2020-06-02 DIAGNOSIS — B20 Human immunodeficiency virus [HIV] disease: Secondary | ICD-10-CM

## 2020-06-02 DIAGNOSIS — D649 Anemia, unspecified: Secondary | ICD-10-CM

## 2020-06-02 DIAGNOSIS — B1910 Unspecified viral hepatitis B without hepatic coma: Secondary | ICD-10-CM

## 2020-06-02 DIAGNOSIS — Z5112 Encounter for antineoplastic immunotherapy: Secondary | ICD-10-CM | POA: Diagnosis not present

## 2020-06-02 DIAGNOSIS — Z5111 Encounter for antineoplastic chemotherapy: Secondary | ICD-10-CM | POA: Diagnosis not present

## 2020-06-02 DIAGNOSIS — C8330 Diffuse large B-cell lymphoma, unspecified site: Secondary | ICD-10-CM | POA: Diagnosis not present

## 2020-06-02 LAB — CBC WITH DIFFERENTIAL/PLATELET
Abs Immature Granulocytes: 0.03 10*3/uL (ref 0.00–0.07)
Basophils Absolute: 0 10*3/uL (ref 0.0–0.1)
Basophils Relative: 0 %
Eosinophils Absolute: 0 10*3/uL (ref 0.0–0.5)
Eosinophils Relative: 0 %
HCT: 33.3 % — ABNORMAL LOW (ref 36.0–46.0)
Hemoglobin: 10.5 g/dL — ABNORMAL LOW (ref 12.0–15.0)
Immature Granulocytes: 1 %
Lymphocytes Relative: 6 %
Lymphs Abs: 0.3 10*3/uL — ABNORMAL LOW (ref 0.7–4.0)
MCH: 25.3 pg — ABNORMAL LOW (ref 26.0–34.0)
MCHC: 31.5 g/dL (ref 30.0–36.0)
MCV: 80.2 fL (ref 80.0–100.0)
Monocytes Absolute: 0.2 10*3/uL (ref 0.1–1.0)
Monocytes Relative: 4 %
Neutro Abs: 4.2 10*3/uL (ref 1.7–7.7)
Neutrophils Relative %: 89 %
Platelets: 278 10*3/uL (ref 150–400)
RBC: 4.15 MIL/uL (ref 3.87–5.11)
WBC: 4.7 10*3/uL (ref 4.0–10.5)
nRBC: 0.6 % — ABNORMAL HIGH (ref 0.0–0.2)

## 2020-06-02 LAB — COMPREHENSIVE METABOLIC PANEL
ALT: 17 U/L (ref 0–44)
AST: 15 U/L (ref 15–41)
Albumin: 3.8 g/dL (ref 3.5–5.0)
Alkaline Phosphatase: 72 U/L (ref 38–126)
Anion gap: 8 (ref 5–15)
BUN: 12 mg/dL (ref 6–20)
CO2: 28 mmol/L (ref 22–32)
Calcium: 9.8 mg/dL (ref 8.9–10.3)
Chloride: 102 mmol/L (ref 98–111)
Creatinine, Ser: 0.45 mg/dL (ref 0.44–1.00)
GFR calc Af Amer: >60 mL/min (ref >60)
GFR calc non Af Amer: >60 mL/min (ref >60)
Glucose, Bld: 145 mg/dL — ABNORMAL HIGH (ref 70–99)
Potassium: 3.5 mmol/L (ref 3.5–5.1)
Sodium: 138 mmol/L (ref 135–145)
Total Bilirubin: 0.4 mg/dL (ref 0.3–1.2)
Total Protein: 6.8 g/dL (ref 6.5–8.1)

## 2020-06-02 MED ORDER — VINCRISTINE SULFATE CHEMO INJECTION 1 MG/ML
Freq: Once | INTRAVENOUS | Status: AC
  Filled 2020-06-02: qty 8

## 2020-06-02 MED ORDER — SODIUM CHLORIDE 0.9 % IV SOLN
Freq: Once | INTRAVENOUS | Status: AC
  Administered 2020-06-02: 18 mg via INTRAVENOUS
  Filled 2020-06-02: qty 4

## 2020-06-02 NOTE — Progress Notes (Signed)
HEMATOLOGY-ONCOLOGY PROGRESS NOTE  SUBJECTIVE:   Patient is tolerating cycle 3 of EPOCH-R chemotherapy well.  Labs are stable.  No shortness of breath.  No mucositis, nausea, vomiting or diarrhea. Constipation controlled with current laxatives.  Denies headaches this morning. No other acute new symptoms today.  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[place])   04/18/2020 -  Chemotherapy   The patient had dexamethasone (DECADRON) 4 MG tablet, 8 mg, Oral, 2 times daily with meals, 1 of 1 cycle, Start date: --, End date: -- DOXOrubicin (ADRIAMYCIN) 16 mg, etoposide (VEPESID) 80 mg, vinCRIStine (ONCOVIN) 0.6 mg in sodium chloride 0.9 % 1,000 mL chemo infusion, , Intravenous, Once, 3 of 6 cycles Administration:  (04/18/2020),  (04/19/2020),  (05/10/2020),  (05/11/2020),  (04/20/2020),  (04/21/2020),  (05/12/2020),  (05/13/2020),  (05/30/2020),  (05/31/2020),  (06/01/2020) ondansetron (ZOFRAN) 8 mg, dexamethasone (DECADRON) 10 mg in sodium chloride 0.9 % 50 mL IVPB, , Intravenous,  Once, 3 of 6 cycles Administration: 8 mg (04/18/2020), 18 mg (04/19/2020), 36 mg (04/22/2020), 18 mg (05/10/2020), 15 mg (05/11/2020), 8 mg (05/14/2020), 8 mg (04/20/2020), 18 mg (04/21/2020), 18 mg (05/12/2020), 18 mg (05/13/2020), 18 mg (05/30/2020), 14 mg (05/31/2020), 68 mg (06/01/2020) methotrexate (PF) 12 mg, hydrocortisone sodium succinate (SOLU-CORTEF) 50 mg in sodium chloride (PF) 0.9 % INTRATHECAL chemo injection, , Intrathecal,  Once, 1 of 4 cycles Administration:  (05/31/2020) cyclophosphamide (CYTOXAN) 1,180 mg in sodium chloride 0.9 % 250 mL chemo infusion, 750 mg/m2 = 1,180 mg, Intravenous,  Once, 3 of 6 cycles Administration: 1,180 mg (04/22/2020), 1,180 mg (05/14/2020)  for chemotherapy treatment.    04/25/2020 -  Chemotherapy   The patient had pegfilgrastim-cbqv [facility]) injection 6 mg, 6 mg,  Subcutaneous, Once, 2 of 6 cycles Administration: 6 mg (04/25/2020), 6 mg (05/16/2020) riTUXimab-pvvr (RUXIENCE) 600 mg in sodium chloride 0.9 % 250 mL (1.9355 mg/mL) infusion, 375 mg/m2 = 600 mg, Intravenous,  Once, 1 of 1 cycle Administration: 600 mg (04/25/2020)  for chemotherapy treatment.       REVIEW OF SYSTEMS:   10 Point review of Systems was done is negative except as noted above.   I have reviewed the past medical history, past surgical history, social history and family history with the patient and they are unchanged from previous note.   PHYSICAL EXAMINATION: ECOG PERFORMANCE STATUS: 1 - Symptomatic but completely ambulatory  Vitals:   06/01/20 2156 06/02/20 0541  BP: (!) 139/80 (!) 145/89  Pulse: 56 51  Resp: 18 18  Temp: 98.3 F (36.8 C) 97.9 F (36.6 C)  SpO2: 99% 100%   Filed Weights   05/30/20 1127  Weight: 58.3 kg    Intake/Output from previous day: 07/28 0701 - 07/29 0700 In: 1300 [P.O.:800; IV Piggyback:500] Out: 1300 [Urine:1300]  . GENERAL:alert, in no acute distress and comfortable SKIN: no acute rashes, no significant lesions EYES: conjunctiva are pink and non-injected, sclera anicteric OROPHARYNX: MMM, no exudates, no oropharyngeal erythema or ulceration NECK: supple, no JVD LYMPH:  no palpable lymphadenopathy in the cervical, axillary or inguinal regions LUNGS: clear to auscultation b/l with normal respiratory effort, decreased Breath sounds left lung base. HEART: regular rate & rhythm ABDOMEN:  normoactive bowel sounds , non tender, not distended. Extremity: no pedal edema PSYCH: alert & oriented x 3 with fluent speech NEURO: no focal motor/sensory deficits    LABORATORY DATA:  I have reviewed  the data as listed CMP Latest Ref Rng & Units 06/02/2020 06/01/2020 05/31/2020  Glucose 70 - 99 mg/dL 145(H) 162(H) 161(H)  BUN 6 - 20 mg/dL 12 15 12   Creatinine 0.44 - 1.00 mg/dL 0.45 0.51 0.44  Sodium 135 - 145 mmol/L 138 138 137  Potassium  3.5 - 5.1 mmol/L 3.5 3.7 4.0  Chloride 98 - 111 mmol/L 102 106 103  CO2 22 - 32 mmol/L 28 26 25   Calcium 8.9 - 10.3 mg/dL 9.8 9.6 9.8  Total Protein 6.5 - 8.1 g/dL 6.8 6.7 7.0  Total Bilirubin 0.3 - 1.2 mg/dL 0.4 0.4 0.4  Alkaline Phos 38 - 126 U/L 72 71 84  AST 15 - 41 U/L 15 15 17   ALT 0 - 44 U/L 17 17 20     Lab Results  Component Value Date   WBC 4.7 06/02/2020   HGB 10.5 (L) 06/02/2020   HCT 33.3 (L) 06/02/2020   MCV 80.2 06/02/2020   PLT 278 06/02/2020   NEUTROABS 4.2 06/02/2020   . CBC Latest Ref Rng & Units 06/02/2020 06/01/2020 05/31/2020  WBC 4.0 - 10.5 K/uL 4.7 9.9 9.9  Hemoglobin 12.0 - 15.0 g/dL 10.5(L) 10.2(L) 10.3(L)  Hematocrit 36 - 46 % 33.3(L) 33.0(L) 33.2(L)  Platelets 150 - 400 K/uL 278 279 239    DG Chest 2 View  Result Date: 05/12/2020 CLINICAL DATA:  Chronic anemia with weight loss and decreased appetite EXAM: CHEST - 2 VIEW COMPARISON:  04/14/2020 FINDINGS: Cardiac shadows within normal limits. Right chest wall port is noted in satisfactory position. Small left pleural effusion is noted but decreased from the prior exam. Underlying atelectatic changes are noted. No bony abnormality is seen. IMPRESSION: Decrease in the degree of left-sided pleural effusion when compared with the prior exam. Electronically Signed   By: Inez Catalina M.D.   On: 05/12/2020 19:01   DG FLUORO GUIDE LUMBAR PUNCTURE  Result Date: 05/31/2020 CLINICAL DATA:  Large B-cell lymphoma. EXAM: DIAGNOSTIC LUMBAR PUNCTURE UNDER FLUOROSCOPIC GUIDANCE WITH CHEMOTHERAPY INJECTION FLUOROSCOPY TIME:  Fluoroscopy Time:  48 seconds Radiation Exposure Index (if provided by the fluoroscopic device): 14.6 mGy Number of Acquired Spot Images: 0 PROCEDURE: Informed consent was obtained from the patient prior to the procedure, including potential complications of headache, allergy, and pain. With the patient prone, the lower back was prepped with Betadine. 1% Lidocaine was used for local anesthesia. Lumbar  puncture was performed at the L3-4 level using a 20 gauge needle with return of clear CSF. Eight ml of CSF were obtained for laboratory studies. Subsequently, 12 mg of methotrexate and 50 mg of hydrocortisone were slowly injected into the thecal sac. The patient described pain with injection, but CSF flow was good both prior and subsequent to methotrexate injection. IMPRESSION: Lumbar puncture for intrathecal chemotherapy as detailed above. Electronically Signed   By: Abigail Miyamoto M.D.   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 Patient notes that she had a CT of the abdomen sometime in 2020 that showed no acute pathology.This was not [facility]. Not accessible to [place] at this point. She did have an MRI of the abdomen in July 2019 which showed indeterminate splenic lesions.No other acute abdominal pathology.No concern with hepatocellular carcinoma.  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 Diffuse splenomegaly. Left periaortic lymph node causing mild displacement of the third portion of the duodenum.  Significantly elevated LDH levels. CA 19-9 and CEA levels unrevealing  # 2 Left sided large pleural effusion and lung atelectasis. S/p diagnostic and therapeutic thoracentesis -- lymphocytic predominance @ 80%  HIV could be risk factors for high grade EBV driven lymphomas  Patient symptomatology has developed rather quickly over the last few weeks.  #3microcytic anemia-chronic.Some element of iron deficiency versus  hemoglobinopathy versus anemia of chronic disease related to malignancy. Patient has received 1 dose of IV Feraheme on 04/08/2020 and repeated on 04/22/2020  #4history of HIV/AIDS follows with Dr. Melynda Keller and at [facility].  #5 Chronic Hep B - VL undetectable on Biktarvy  PLAN: -Labs are stable and patient has no symptoms of prohibitive toxicities from her chemotherapy and is stable to get cycle 4-day 3 of EPOCH-R today. -tolerated IT MTX on D2 well though had significant transient bodyaches during intrathecal injection.Will add pain medications to premedications with next IT MTX and bump up Dexamethasone premed. --Continue as needed antiemetics. -Continue MiraLAX and Senna-s for constipation daily -Will need to be cautious with rituximab to prevent recurrent hepatitis B flare.  Repeat HBV DNA undetectable on 04/13/2020.  Repeat hepatitis B viral DNA pending. -continue Biktarvy for HIV and Hep B control -The patient is scheduled at the cancer center on 06/06/2020 for rituximab and Neulasta and will return for labs and a follow-up visit with Dr Irene Limbo on 06/13/2020. -PET/CT ordered - to be scheduled prior to C4 of chemotherapy  Mikey Bussing, DNP, AGPCNP-BC, AOCNP Mon/Tues/Thurs/Fri 7am-5pm; Off Wednesdays Cell: [phone]

## 2020-06-02 NOTE — Progress Notes (Signed)
HEMATOLOGY-ONCOLOGY PROGRESS NOTE  SUBJECTIVE:   Patient is tolerated cycle 3-day 3 of EPOCH-R chemotherapy well.  Labs are stable.  No shortness of breath.  No nausea vomiting or diarrhea. Constipation controlled with current laxatives.  Tolerated her IT MTX well overall yesterday though she did have significant transient bodyaches that resolved in <5 mins after injection of the MTX. Mild headache yesterday evening from LP -- resolved completely with hydration and caffeine use. No other acute new symptoms today.  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[place])   04/18/2020 -  Chemotherapy   The patient had dexamethasone (DECADRON) 4 MG tablet, 8 mg, Oral, 2 times daily with meals, 1 of 1 cycle, Start date: --, End date: -- DOXOrubicin (ADRIAMYCIN) 16 mg, etoposide (VEPESID) 80 mg, vinCRIStine (ONCOVIN) 0.6 mg in sodium chloride 0.9 % 1,000 mL chemo infusion, , Intravenous, Once, 3 of 6 cycles Administration:  (04/18/2020),  (04/19/2020),  (05/10/2020),  (05/11/2020),  (04/20/2020),  (04/21/2020),  (05/12/2020),  (05/13/2020),  (05/30/2020),  (05/31/2020) ondansetron (ZOFRAN) 8 mg, dexamethasone (DECADRON) 10 mg in sodium chloride 0.9 % 50 mL IVPB, , Intravenous,  Once, 3 of 6 cycles Administration: 8 mg (04/18/2020), 18 mg (04/19/2020), 36 mg (04/22/2020), 18 mg (05/10/2020), 15 mg (05/11/2020), 8 mg (05/14/2020), 8 mg (04/20/2020), 18 mg (04/21/2020), 18 mg (05/12/2020), 18 mg (05/13/2020), 18 mg (05/30/2020), 14 mg (05/31/2020) methotrexate (PF) 12 mg, hydrocortisone sodium succinate (SOLU-CORTEF) 50 mg in sodium chloride (PF) 0.9 % INTRATHECAL chemo injection, , Intrathecal,  Once, 1 of 4 cycles Administration:  (05/31/2020) cyclophosphamide (CYTOXAN) 1,180 mg in sodium chloride 0.9 % 250 mL chemo infusion, 750 mg/m2 = 1,180 mg, Intravenous,  Once, 3 of 6 cycles Administration: 1,180  mg (04/22/2020), 1,180 mg (05/14/2020)  for chemotherapy treatment.    04/25/2020 -  Chemotherapy   The patient had pegfilgrastim-cbqv [facility]) injection 6 mg, 6 mg, Subcutaneous, Once, 2 of 6 cycles Administration: 6 mg (04/25/2020), 6 mg (05/16/2020) riTUXimab-pvvr (RUXIENCE) 600 mg in sodium chloride 0.9 % 250 mL (1.9355 mg/mL) infusion, 375 mg/m2 = 600 mg, Intravenous,  Once, 1 of 1 cycle Administration: 600 mg (04/25/2020)  for chemotherapy treatment.       REVIEW OF SYSTEMS:   10 Point review of Systems was done is negative except as noted above.   I have reviewed the past medical history, past surgical history, social history and family history with the patient and they are unchanged from previous note.   PHYSICAL EXAMINATION: ECOG PERFORMANCE STATUS: 1 - Symptomatic but completely ambulatory  Vitals:   06/01/20 1548 06/01/20 2156  BP: 122/80 (!) 139/80  Pulse: 74 56  Resp: 16 18  Temp: 98 F (36.7 C) 98.3 F (36.8 C)  SpO2: 99% 99%   Filed Weights   05/30/20 1127  Weight: 128 lb 8 oz (58.3 kg)    Intake/Output from previous day: 07/28 0701 - 07/29 0700 In: 1300 [P.O.:800; IV Piggyback:500] Out: 1300 [Urine:1300]  . GENERAL:alert, in no acute distress and comfortable SKIN: no acute rashes, no significant lesions EYES: conjunctiva are pink and non-injected, sclera anicteric OROPHARYNX: MMM, no exudates, no oropharyngeal erythema or ulceration NECK: supple, no JVD LYMPH:  no palpable lymphadenopathy in the cervical, axillary or inguinal regions LUNGS: clear to auscultation b/l with normal respiratory effort, decreased Breath sounds left lung base. HEART: regular rate & rhythm ABDOMEN:  normoactive bowel sounds ,  non tender, not distended. Extremity: no pedal edema PSYCH: alert & oriented x 3 with fluent speech NEURO: no focal motor/sensory deficits    LABORATORY DATA:  I have reviewed the data as listed CMP Latest Ref Rng & Units 06/01/2020 05/31/2020  05/30/2020  Glucose 70 - 99 mg/dL 162(H) 161(H) 87  BUN 6 - 20 mg/dL 15 12 11   Creatinine 0.44 - 1.00 mg/dL 0.51 0.44 0.53  Sodium 135 - 145 mmol/L 138 137 138  Potassium 3.5 - 5.1 mmol/L 3.7 4.0 3.8  Chloride 98 - 111 mmol/L 106 103 101  CO2 22 - 32 mmol/L 26 25 27   Calcium 8.9 - 10.3 mg/dL 9.6 9.8 9.7  Total Protein 6.5 - 8.1 g/dL 6.7 7.0 6.7  Total Bilirubin 0.3 - 1.2 mg/dL 0.4 0.4 0.3  Alkaline Phos 38 - 126 U/L 71 84 83  AST 15 - 41 U/L 15 17 17   ALT 0 - 44 U/L 17 20 20     Lab Results  Component Value Date   WBC 9.9 06/01/2020   HGB 10.2 (L) 06/01/2020   HCT 33.0 (L) 06/01/2020   MCV 81.5 06/01/2020   PLT 279 06/01/2020   NEUTROABS 9.1 (H) 06/01/2020   . CBC Latest Ref Rng & Units 06/01/2020 05/31/2020 05/30/2020  WBC 4.0 - 10.5 K/uL 9.9 9.9 7.4  Hemoglobin 12.0 - 15.0 g/dL 10.2(L) 10.3(L) 10.0(L)  Hematocrit 36 - 46 % 33.0(L) 33.2(L) 32.0(L)  Platelets 150 - 400 K/uL 279 239 233    DG Chest 2 View  Result Date: 05/12/2020 CLINICAL DATA:  Chronic anemia with weight loss and decreased appetite EXAM: CHEST - 2 VIEW COMPARISON:  04/14/2020 FINDINGS: Cardiac shadows within normal limits. Right chest wall port is noted in satisfactory position. Small left pleural effusion is noted but decreased from the prior exam. Underlying atelectatic changes are noted. No bony abnormality is seen. IMPRESSION: Decrease in the degree of left-sided pleural effusion when compared with the prior exam. Electronically Signed   By: Inez Catalina M.D.   On: 05/12/2020 19:01   DG FLUORO GUIDE LUMBAR PUNCTURE  Result Date: 05/31/2020 CLINICAL DATA:  Large B-cell lymphoma. EXAM: DIAGNOSTIC LUMBAR PUNCTURE UNDER FLUOROSCOPIC GUIDANCE WITH CHEMOTHERAPY INJECTION FLUOROSCOPY TIME:  Fluoroscopy Time:  48 seconds Radiation Exposure Index (if provided by the fluoroscopic device): 14.6 mGy Number of Acquired Spot Images: 0 PROCEDURE: Informed consent was obtained from the patient prior to the procedure, including  potential complications of headache, allergy, and pain. With the patient prone, the lower back was prepped with Betadine. 1% Lidocaine was used for local anesthesia. Lumbar puncture was performed at the L3-4 level using a 20 gauge needle with return of clear CSF. Eight ml of CSF were obtained for laboratory studies. Subsequently, 12 mg of methotrexate and 50 mg of hydrocortisone were slowly injected into the thecal sac. The patient described pain with injection, but CSF flow was good both prior and subsequent to methotrexate injection. IMPRESSION: Lumbar puncture for intrathecal chemotherapy as detailed above. Electronically Signed   By: Abigail Miyamoto M.D.   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 Patient notes that she had a CT of the abdomen sometime in 2020 that showed no acute pathology.This was not [facility] Not accessible to [place] at  this point. She did have an MRI of the abdomen in July 2019 which showed indeterminate splenic lesions.No other acute abdominal pathology.No concern with hepatocellular carcinoma.  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 Diffuse splenomegaly. Left periaortic lymph node causing mild displacement of the third portion of the duodenum.  Significantly elevated LDH levels. CA 19-9 and CEA levels unrevealing  # 2 Left sided large pleural effusion and lung atelectasis. S/p diagnostic and therapeutic thoracentesis -- lymphocytic predominance @ 80%  HIV could be risk factors for high grade EBV driven  lymphomas  Patient symptomatology has developed rather quickly over the last few weeks.  #16microcytic anemia-chronic.Some element of iron deficiency versus hemoglobinopathy versus anemia of chronic disease related to malignancy. Patient has received 1 dose of IV Feraheme on 04/08/2020 and repeated on 04/22/2020  #4history of HIV/AIDS follows with Dr. Melynda Keller and at [facility].  #5 Chronic Hep B - VL undetectable on Biktarvy  PLAN: -Labs are stable and patient has no symptoms of prohibitive toxicities from her chemotherapy and is stable to get cycle 3-day 3 of EPOCH-R today. -tolerated IT MTX on D2 well though had significant transient bodyaches during intrathecal injection.Will add pain medications to premedications with next IT MTX and bump up Dexamethasone premed. --Continue as needed antiemetics. -Continue MiraLAX and Senna-s for constipation daily -Will need to be cautious with rituximab to prevent recurrent hepatitis B flare.  Repeat HBV DNA undetectable on 04/13/2020. Will rpt again prior to discharge this admission. -continue Biktarvy for HIV and Hep B control -The patient is scheduled at the cancer center on 06/06/2020 for rituximab and Neulasta and will return for labs and a follow-up visit with Dr Irene Limbo on 06/13/2020. -PET/CT ordered - to be scheduled prior to C4 of chemotherapy -Mikey Bussing and Dr Armandina Gemma shall be rounding on Thursday and Friday since I shall be out of town.   Sullivan Lone MD MS

## 2020-06-03 DIAGNOSIS — B20 Human immunodeficiency virus [HIV] disease: Secondary | ICD-10-CM | POA: Diagnosis not present

## 2020-06-03 DIAGNOSIS — Z5111 Encounter for antineoplastic chemotherapy: Secondary | ICD-10-CM | POA: Diagnosis not present

## 2020-06-03 DIAGNOSIS — C8330 Diffuse large B-cell lymphoma, unspecified site: Secondary | ICD-10-CM | POA: Diagnosis not present

## 2020-06-03 DIAGNOSIS — Z5112 Encounter for antineoplastic immunotherapy: Secondary | ICD-10-CM | POA: Diagnosis not present

## 2020-06-03 LAB — HEPATITIS B DNA, ULTRAQUANTITATIVE, PCR
HBV DNA SERPL PCR-ACNC: NOT DETECTED IU/mL
HBV DNA SERPL PCR-LOG IU: UNDETERMINED log10 IU/mL

## 2020-06-03 LAB — CBC WITH DIFFERENTIAL/PLATELET
Abs Immature Granulocytes: 0.03 10*3/uL (ref 0.00–0.07)
Basophils Absolute: 0 10*3/uL (ref 0.0–0.1)
Basophils Relative: 0 %
Eosinophils Absolute: 0 10*3/uL (ref 0.0–0.5)
Eosinophils Relative: 0 %
HCT: 33.5 % — ABNORMAL LOW (ref 36.0–46.0)
Hemoglobin: 10.6 g/dL — ABNORMAL LOW (ref 12.0–15.0)
Immature Granulocytes: 1 %
Lymphocytes Relative: 12 %
Lymphs Abs: 0.3 10*3/uL — ABNORMAL LOW (ref 0.7–4.0)
MCH: 25.1 pg — ABNORMAL LOW (ref 26.0–34.0)
MCHC: 31.6 g/dL (ref 30.0–36.0)
MCV: 79.2 fL — ABNORMAL LOW (ref 80.0–100.0)
Monocytes Absolute: 0.1 10*3/uL (ref 0.1–1.0)
Monocytes Relative: 3 %
Neutro Abs: 2 10*3/uL (ref 1.7–7.7)
Neutrophils Relative %: 84 %
Platelets: 316 10*3/uL (ref 150–400)
RBC: 4.23 MIL/uL (ref 3.87–5.11)
WBC: 2.4 10*3/uL — ABNORMAL LOW (ref 4.0–10.5)
nRBC: 0.8 % — ABNORMAL HIGH (ref 0.0–0.2)

## 2020-06-03 LAB — COMPREHENSIVE METABOLIC PANEL
ALT: 37 U/L (ref 0–44)
AST: 28 U/L (ref 15–41)
Albumin: 3.8 g/dL (ref 3.5–5.0)
Alkaline Phosphatase: 67 U/L (ref 38–126)
Anion gap: 9 (ref 5–15)
BUN: 13 mg/dL (ref 6–20)
CO2: 28 mmol/L (ref 22–32)
Calcium: 9.6 mg/dL (ref 8.9–10.3)
Chloride: 99 mmol/L (ref 98–111)
Creatinine, Ser: 0.53 mg/dL (ref 0.44–1.00)
GFR calc Af Amer: >60 mL/min (ref >60)
GFR calc non Af Amer: >60 mL/min (ref >60)
Glucose, Bld: 119 mg/dL — ABNORMAL HIGH (ref 70–99)
Potassium: 3.7 mmol/L (ref 3.5–5.1)
Sodium: 136 mmol/L (ref 135–145)
Total Bilirubin: 0.6 mg/dL (ref 0.3–1.2)
Total Protein: 7 g/dL (ref 6.5–8.1)

## 2020-06-03 MED ORDER — HEPARIN SOD (PORK) LOCK FLUSH 100 UNIT/ML IV SOLN
500.0000 [IU] | INTRAVENOUS | Status: DC | PRN
  Filled 2020-06-03: qty 5

## 2020-06-03 MED ORDER — POTASSIUM CHLORIDE CRYS ER 20 MEQ PO TBCR: 20.0000 meq | EXTENDED_RELEASE_TABLET | Freq: Every day | ORAL | 0 refills | Status: DC

## 2020-06-03 MED ORDER — HEPARIN SOD (PORK) LOCK FLUSH 100 UNIT/ML IV SOLN
500.0000 [IU] | INTRAVENOUS | Status: DC
  Administered 2020-06-03: 500 [IU]

## 2020-06-03 MED ORDER — PREDNISONE 20 MG PO TABS: 60.0000 mg | ORAL_TABLET | Freq: Once | ORAL | Status: AC

## 2020-06-03 MED ORDER — SENNOSIDES-DOCUSATE SODIUM 8.6-50 MG PO TABS: 2.0000 | ORAL_TABLET | Freq: Every evening | ORAL | Status: AC | PRN

## 2020-06-03 MED ORDER — SODIUM CHLORIDE 0.9 % IV SOLN
Freq: Once | INTRAVENOUS | Status: AC
  Administered 2020-06-03: 36 mg via INTRAVENOUS
  Filled 2020-06-03: qty 8

## 2020-06-03 MED ORDER — SODIUM CHLORIDE 0.9 % IV SOLN
750.0000 mg/m2 | Freq: Once | INTRAVENOUS | Status: AC
  Administered 2020-06-03: 1180 mg via INTRAVENOUS
  Filled 2020-06-03: qty 59

## 2020-06-03 NOTE — Discharge Summary (Signed)
Physician Discharge Summary  Patient ID: Heather [name] MRN: [id number] DOB/AGE: Nov 24, 1975 44 y.o.  Admit date: 05/30/2020 Discharge date: 06/03/2020  Discharge Diagnoses:  Active Problems:   Diffuse large B-cell lymphoma of lymph nodes of multiple regions (HCC)   Large cell (diffuse) non-Hodgkin's lymphoma (HCC)  Discharged Condition: good  Discharge Labs:    CBC    Component Value Date/Time   WBC 2.4 (L) 06/03/2020 0707   RBC 4.23 06/03/2020 0707   HGB 10.6 (L) 06/03/2020 0707   HGB 9.9 (L) 04/28/2020 1217   HCT 33.5 (L) 06/03/2020 0707   PLT 316 06/03/2020 0707   PLT 198 04/28/2020 1217   MCV 79.2 (L) 06/03/2020 0707   MCH 25.1 (L) 06/03/2020 0707   MCHC 31.6 06/03/2020 0707   RDW Not Measured 06/03/2020 0707   LYMPHSABS 0.3 (L) 06/03/2020 0707   MONOABS 0.1 06/03/2020 0707   EOSABS 0.0 06/03/2020 0707   BASOSABS 0.0 06/03/2020 0707   CMP Latest Ref Rng & Units 06/03/2020 06/02/2020 06/01/2020  Glucose 70 - 99 mg/dL 119(H) 145(H) 162(H)  BUN 6 - 20 mg/dL 13 12 15   Creatinine 0.44 - 1.00 mg/dL 0.53 0.45 0.51  Sodium 135 - 145 mmol/L 136 138 138  Potassium 3.5 - 5.1 mmol/L 3.7 3.5 3.7  Chloride 98 - 111 mmol/L 99 102 106  CO2 22 - 32 mmol/L 28 28 26   Calcium 8.9 - 10.3 mg/dL 9.6 9.8 9.6  Total Protein 6.5 - 8.1 g/dL 7.0 6.8 6.7  Total Bilirubin 0.3 - 1.2 mg/dL 0.6 0.4 0.4  Alkaline Phos 38 - 126 U/L 67 72 71  AST 15 - 41 U/L 28 15 15   ALT 0 - 44 U/L 37 17 17    Consults: None  Procedures: Received intrathecal methotrexate in radiology on 05/31/2020  Disposition:  Discharge disposition: 01-Home or Self Care      Allergies as of 06/03/2020   No Known Allergies     Medication List    STOP taking these medications   pantoprazole 40 MG tablet Commonly known as: PROTONIX     TAKE these medications   acetaminophen 325 MG tablet Commonly known as: TYLENOL Take 2 tablets (650 mg total) by mouth every 4 (four) hours as needed for mild pain.   B COMPLEX  PO Take 1 tablet by mouth daily.   Biktarvy 50-200-25 MG Tabs tablet Generic drug: bictegravir-emtricitabine-tenofovir AF Take 1 tablet by mouth daily with breakfast.   cholecalciferol 25 MCG (1000 UNIT) tablet Commonly known as: VITAMIN D3 Take 1 tablet (1,000 Units total) by mouth at bedtime.   feeding supplement (ENSURE ENLIVE) Liqd Take 237 mLs by mouth 2 (two) times daily between meals.   HYDROcodone-acetaminophen 5-325 MG tablet Commonly known as: Norco Take 1 tablet by mouth every 6 (six) hours as needed for moderate pain.   ibuprofen 200 MG tablet Commonly known as: ADVIL Take 2 tablets (400 mg total) by mouth every 6 (six) hours as needed for mild pain.   lidocaine-prilocaine cream Commonly known as: EMLA Apply to affected area once What changed:   how much to take  how to take this  when to take this  reasons to take this   LORazepam 0.5 MG tablet Commonly known as: ATIVAN Take 1 tablet (0.5 mg total) by mouth every 6 (six) hours as needed (for chemo induced nausea or vomiting).   multivitamin with minerals Tabs tablet Take 1 tablet by mouth at bedtime.   ondansetron 8 MG tablet Commonly known as:  ZOFRAN Take 1 tablet (8 mg total) by mouth every 8 (eight) hours as needed for nausea or vomiting.   polyethylene glycol 17 g packet Commonly known as: MIRALAX / GLYCOLAX Take 17 g by mouth daily as needed for mild constipation. What changed: when to take this   potassium chloride SA 20 MEQ tablet Commonly known as: KLOR-CON Take 1 tablet (20 mEq total) by mouth daily. Take for 5 days following hospital discharge.   prochlorperazine 10 MG tablet Commonly known as: COMPAZINE Take 1 tablet (10 mg total) by mouth every 6 (six) hours as needed for nausea or vomiting.   senna-docusate 8.6-50 MG tablet Commonly known as: Senokot-S Take 2 tablets by mouth at bedtime as needed for mild constipation.   sodium bicarbonate/sodium chloride Soln 1 application by  Mouth Rinse route 4 (four) times daily.       HPI: Heather [name] is a wonderful 44 y.o. female with a past medical history significant for hepatitis B, HIV, chronic anemia who presented to [facility] with abdominal pain. She had a 2-week history of early satiety, poor appetite and, 5 pound weight loss. Abdominal pain is typically postprandial and mainly at night after dinner while laying in bed. Has been associated with nausea improves after vomiting. Labs on admission showed a hemoglobin of 9.3, MCV 67.4, platelet count 554,000. Lipase was mildly elevated at 123. 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  During her initial consult, she was having ongoingpain is primarily in her epigastric area and radiates to the left side of her abdomen. States pain is worse when laying down after she eats. She states that she has had this pain for at least 2 weeks. Her abdominal pain is worse after eating. She has had some nausea and intermittent vomiting. Vomiting makes her pain better. She was attributing her symptoms to some the medications that she was taking for fertility treatments. She reports a poor appetite and about a 5 pound weight loss. She is not having any fevers or chills. Denies night sweats. She has not noticed any palpable lymphadenopathy. The patient underwent a biopsy of her pancreatic mass on 04/08/2020 ([id number]) with results feeling diffuse large B-cell lymphoma.  It was recommended for her to undergo systemic chemotherapy with EPOCH-R.    On the day of admission, the patient was feeling well.  She reported that her appetite was improving.  Abdominal pain resolved and she was not having any nausea or vomiting.  She had no chest pain or shortness of breath. She was admitted for cycle #3 of EPOCH-R.   Hospital Course:  The patient tolerated her third cycle of chemotherapy well overall.  She had mild nausea without any vomiting.  She was placed on MiraLAX and Senokot-S for constipation which was effective.  She had no abdominal pain, chest pain, shortness of breath, or mucositis this admission.  She received intrathecal methotrexate for CNS prophylaxis on 05/31/2020 and tolerated it fairly well but did develop transient body aches during the intrathecal injection.  We will plan to add pain medication to premedications with next intrathecal methotrexate and increase dexamethasone premed.  On the day of discharge  Heather [name] has no acute new issues.  A refill of potassium was sent to her pharmacy.   The patient will return to the cancer center on 06/06/2020 for Rituxan and Neulasta and she will have a follow-up visit with labs on 06/13/2020.  The patient was instructed to call our office for a fever of 100.5 or higher, uncontrolled nausea or vomiting, or any other concerns.  Exam:  GENERAL:alert, in no acute distress and comfortable SKIN: no acute rashes, no significant lesions EYES: conjunctiva are pink and non-injected, sclera anicteric OROPHARYNX: Small ulcerations in the posterior pharynx NECK: supple, no JVD LYMPH:  no palpable lymphadenopathy in the cervical, axillary or inguinal regions LUNGS: decreased air entry left lung base. HEART: regular rate & rhythm ABDOMEN:  normoactive bowel sounds , non tender, not distended. Extremity: no pedal edema PSYCH: alert & oriented x 3 with fluent speech NEURO: no focal motor/sensory deficits  Discharge Instructions    Activity as tolerated - No restrictions   Complete by: As directed     Call MD for:   Complete by: As directed    Temperature of 100.5 or higher, uncontrolled nausea or vomiting, or any other concerns.   Diet general   Complete by: As directed      Signed: Mikey Bussing 06/03/2020, 9:40 AM

## 2020-06-03 NOTE — Progress Notes (Signed)
Patient discharged home with spouse in stable condition. Discharge instructions given. Script sent to pharmacy of choice. No immediate questions or concerns at this time. Patient discharged home from unit via wheelchair.

## 2020-06-05 NOTE — Progress Notes (Cosign Needed)
Chemo dosages and dilutions verified by 2 chemo RNs 

## 2020-06-06 ENCOUNTER — Other Ambulatory Visit: Payer: Self-pay | Admitting: Hematology

## 2020-06-06 ENCOUNTER — Other Ambulatory Visit: Payer: Self-pay

## 2020-06-06 ENCOUNTER — Inpatient Hospital Stay: Payer: 59 | Attending: Hematology

## 2020-06-06 VITALS — BP 112/74 | HR 82 | Temp 98.5°F | Resp 18 | Wt 132.0 lb

## 2020-06-06 DIAGNOSIS — Z5112 Encounter for antineoplastic immunotherapy: Secondary | ICD-10-CM | POA: Insufficient documentation

## 2020-06-06 DIAGNOSIS — Z79899 Other long term (current) drug therapy: Secondary | ICD-10-CM | POA: Diagnosis not present

## 2020-06-06 DIAGNOSIS — C8510 Unspecified B-cell lymphoma, unspecified site: Secondary | ICD-10-CM | POA: Insufficient documentation

## 2020-06-06 DIAGNOSIS — D6489 Other specified anemias: Secondary | ICD-10-CM | POA: Diagnosis not present

## 2020-06-06 DIAGNOSIS — Z7189 Other specified counseling: Secondary | ICD-10-CM

## 2020-06-06 DIAGNOSIS — C8338 Diffuse large B-cell lymphoma, lymph nodes of multiple sites: Secondary | ICD-10-CM

## 2020-06-06 DIAGNOSIS — Z5189 Encounter for other specified aftercare: Secondary | ICD-10-CM | POA: Insufficient documentation

## 2020-06-06 DIAGNOSIS — B20 Human immunodeficiency virus [HIV] disease: Secondary | ICD-10-CM | POA: Diagnosis not present

## 2020-06-06 MED ORDER — SODIUM CHLORIDE 0.9 % IV SOLN
375.0000 mg/m2 | Freq: Once | INTRAVENOUS | Status: AC
  Administered 2020-06-06: 600 mg via INTRAVENOUS
  Filled 2020-06-06: qty 10

## 2020-06-06 MED ORDER — ACETAMINOPHEN 325 MG PO TABS
650.0000 mg | ORAL_TABLET | Freq: Once | ORAL | Status: AC
  Administered 2020-06-06: 650 mg via ORAL

## 2020-06-06 MED ORDER — HEPARIN SOD (PORK) LOCK FLUSH 100 UNIT/ML IV SOLN
500.0000 [IU] | Freq: Once | INTRAVENOUS | Status: AC | PRN
  Administered 2020-06-06: 500 [IU]
  Filled 2020-06-06: qty 5

## 2020-06-06 MED ORDER — HYDROXYZINE HCL 25 MG PO TABS
25.0000 mg | ORAL_TABLET | Freq: Once | ORAL | Status: AC
  Administered 2020-06-06: 25 mg via ORAL
  Filled 2020-06-06: qty 1

## 2020-06-06 MED ORDER — SODIUM CHLORIDE 0.9 % IV SOLN
Freq: Once | INTRAVENOUS | Status: AC
  Filled 2020-06-06: qty 250

## 2020-06-06 MED ORDER — ACETAMINOPHEN 325 MG PO TABS
ORAL_TABLET | ORAL | Status: AC
  Filled 2020-06-06: qty 2

## 2020-06-06 MED ORDER — PEGFILGRASTIM-CBQV 6 MG/0.6ML ~~LOC~~ SOSY
PREFILLED_SYRINGE | SUBCUTANEOUS | Status: AC
  Filled 2020-06-06: qty 0.6

## 2020-06-06 MED ORDER — SODIUM CHLORIDE 0.9% FLUSH
10.0000 mL | INTRAVENOUS | Status: DC | PRN
  Administered 2020-06-06: 10 mL
  Filled 2020-06-06: qty 10

## 2020-06-06 MED ORDER — PEGFILGRASTIM-CBQV 6 MG/0.6ML ~~LOC~~ SOSY
6.0000 mg | PREFILLED_SYRINGE | Freq: Once | SUBCUTANEOUS | Status: AC
  Administered 2020-06-06: 6 mg via SUBCUTANEOUS

## 2020-06-06 NOTE — Patient Instructions (Signed)
[facility] Discharge Instructions for Patients Receiving Chemotherapy   Today you received the following chemotherapy agents Rituximab-pvvr (RUXIENCE).  To help prevent nausea and vomiting after your treatment, we encourage you to take your nausea medication as prescribed.   If you develop nausea and vomiting that is not controlled by your nausea medication, call the clinic.   BELOW ARE SYMPTOMS THAT SHOULD BE REPORTED IMMEDIATELY:  *FEVER GREATER THAN 100.5 F  *CHILLS WITH OR WITHOUT FEVER  NAUSEA AND VOMITING THAT IS NOT CONTROLLED WITH YOUR NAUSEA MEDICATION  *UNUSUAL SHORTNESS OF BREATH  *UNUSUAL BRUISING OR BLEEDING  TENDERNESS IN MOUTH AND THROAT WITH OR WITHOUT PRESENCE OF ULCERS  *URINARY PROBLEMS  *BOWEL PROBLEMS  UNUSUAL RASH Items with * indicate a potential emergency and should be followed up as soon as possible.  Feel free to call the clinic should you have any questions or concerns. The clinic phone number is [phone].  Please show the CHEMO ALERT CARD at check-in to the Emergency Department and triage nurse.  Pegfilgrastim injection What is this medicine? PEGFILGRASTIM (PEG fil gra stim) is a long-acting granulocyte colony-stimulating factor that stimulates the growth of neutrophils, a type of white blood cell important in the body's fight against infection. It is used to reduce the incidence of fever and infection in patients with certain types of cancer who are receiving chemotherapy that affects the bone marrow, and to increase survival after being exposed to high doses of radiation. This medicine may be used for other purposes; ask your health care provider or pharmacist if you have questions. COMMON BRAND NAME(S): Fulphila, Neulasta, UDENYCA, Ziextenzo What should I tell my health care provider before I take this medicine? They need to know if you have any of these conditions:  kidney disease  latex allergy  ongoing radiation  therapy  sickle cell disease  skin reactions to acrylic adhesives (On-Body Injector only)  an unusual or allergic reaction to pegfilgrastim, filgrastim, other medicines, foods, dyes, or preservatives  pregnant or trying to get pregnant  breast-feeding How should I use this medicine? This medicine is for injection under the skin. If you get this medicine at home, you will be taught how to prepare and give the pre-filled syringe or how to use the On-body Injector. Refer to the patient Instructions for Use for detailed instructions. Use exactly as directed. Tell your healthcare provider immediately if you suspect that the On-body Injector may not have performed as intended or if you suspect the use of the On-body Injector resulted in a missed or partial dose. It is important that you put your used needles and syringes in a special sharps container. Do not put them in a trash can. If you do not have a sharps container, call your pharmacist or healthcare provider to get one. Talk to your pediatrician regarding the use of this medicine in children. While this drug may be prescribed for selected conditions, precautions do apply. Overdosage: If you think you have taken too much of this medicine contact a poison control center or emergency room at once. NOTE: This medicine is only for you. Do not share this medicine with others. What if I miss a dose? It is important not to miss your dose. Call your doctor or health care professional if you miss your dose. If you miss a dose due to an On-body Injector failure or leakage, a new dose should be administered as soon as possible using a single prefilled syringe for manual use. What   may interact with this medicine? Interactions have not been studied. Give your health care provider a list of all the medicines, herbs, non-prescription drugs, or dietary supplements you use. Also tell them if you smoke, drink alcohol, or use illegal drugs. Some items may interact  with your medicine. This list may not describe all possible interactions. Give your health care provider a list of all the medicines, herbs, non-prescription drugs, or dietary supplements you use. Also tell them if you smoke, drink alcohol, or use illegal drugs. Some items may interact with your medicine. What should I watch for while using this medicine? You may need blood work done while you are taking this medicine. If you are going to need a MRI, CT scan, or other procedure, tell your doctor that you are using this medicine (On-Body Injector only). What side effects may I notice from receiving this medicine? Side effects that you should report to your doctor or health care professional as soon as possible:  allergic reactions like skin rash, itching or hives, swelling of the face, lips, or tongue  back pain  dizziness  fever  pain, redness, or irritation at site where injected  pinpoint red spots on the skin  red or dark-brown urine  shortness of breath or breathing problems  stomach or side pain, or pain at the shoulder  swelling  tiredness  trouble passing urine or change in the amount of urine Side effects that usually do not require medical attention (report to your doctor or health care professional if they continue or are bothersome):  bone pain  muscle pain This list may not describe all possible side effects. Call your doctor for medical advice about side effects. You may report side effects to FDA at 1-800-FDA-1088. Where should I keep my medicine? Keep out of the reach of children. If you are using this medicine at home, you will be instructed on how to store it. Throw away any unused medicine after the expiration date on the label. NOTE: This sheet is a summary. It may not cover all possible information. If you have questions about this medicine, talk to your doctor, pharmacist, or health care provider.  2020 [employer]/Gold Standard (2018-01-27 16:57:08)   

## 2020-06-10 ENCOUNTER — Other Ambulatory Visit: Payer: Self-pay

## 2020-06-10 ENCOUNTER — Ambulatory Visit (HOSPITAL_COMMUNITY)
Admission: RE | Admit: 2020-06-10 | Discharge: 2020-06-10 | Disposition: A | Discharge status: Home / Self Care | Payer: 59 | Source: Ambulatory Visit | Attending: Hematology | Admitting: Hematology

## 2020-06-10 DIAGNOSIS — Z95828 Presence of other vascular implants and grafts: Secondary | ICD-10-CM | POA: Insufficient documentation

## 2020-06-10 DIAGNOSIS — C8338 Diffuse large B-cell lymphoma, lymph nodes of multiple sites: Secondary | ICD-10-CM | POA: Diagnosis not present

## 2020-06-10 LAB — GLUCOSE, CAPILLARY: Glucose-Capillary: 122 mg/dL — ABNORMAL HIGH (ref 70–99)

## 2020-06-10 MED ORDER — FLUDEOXYGLUCOSE F - 18 (FDG) INJECTION
7.1000 | Freq: Once | INTRAVENOUS | Status: AC | PRN
  Administered 2020-06-10: 7.1 via INTRAVENOUS

## 2020-06-13 ENCOUNTER — Other Ambulatory Visit: Payer: Self-pay

## 2020-06-13 ENCOUNTER — Inpatient Hospital Stay: Payer: 59 | Admitting: Hematology

## 2020-06-13 ENCOUNTER — Inpatient Hospital Stay: Payer: 59

## 2020-06-13 VITALS — BP 111/78 | HR 103 | Temp 98.0°F | Resp 18 | Ht 60.0 in | Wt 131.6 lb

## 2020-06-13 DIAGNOSIS — Z5112 Encounter for antineoplastic immunotherapy: Secondary | ICD-10-CM | POA: Diagnosis not present

## 2020-06-13 DIAGNOSIS — C8338 Diffuse large B-cell lymphoma, lymph nodes of multiple sites: Secondary | ICD-10-CM | POA: Diagnosis not present

## 2020-06-13 DIAGNOSIS — Z95828 Presence of other vascular implants and grafts: Secondary | ICD-10-CM

## 2020-06-13 LAB — CBC WITH DIFFERENTIAL/PLATELET
Abs Immature Granulocytes: 9.95 10*3/uL — ABNORMAL HIGH (ref 0.00–0.07)
Basophils Absolute: 0.1 10*3/uL (ref 0.0–0.1)
Basophils Relative: 0 %
Eosinophils Absolute: 0.1 10*3/uL (ref 0.0–0.5)
Eosinophils Relative: 0 %
HCT: 31.6 % — ABNORMAL LOW (ref 36.0–46.0)
Hemoglobin: 9.9 g/dL — ABNORMAL LOW (ref 12.0–15.0)
Immature Granulocytes: 24 %
Lymphocytes Relative: 2 %
Lymphs Abs: 0.9 10*3/uL (ref 0.7–4.0)
MCH: 25.8 pg — ABNORMAL LOW (ref 26.0–34.0)
MCHC: 31.3 g/dL (ref 30.0–36.0)
MCV: 82.3 fL (ref 80.0–100.0)
Monocytes Absolute: 4 10*3/uL — ABNORMAL HIGH (ref 0.1–1.0)
Monocytes Relative: 10 %
Neutro Abs: 27 10*3/uL — ABNORMAL HIGH (ref 1.7–7.7)
Neutrophils Relative %: 64 %
Platelets: 147 10*3/uL — ABNORMAL LOW (ref 150–400)
RBC: 3.84 MIL/uL — ABNORMAL LOW (ref 3.87–5.11)
WBC: 42 10*3/uL — ABNORMAL HIGH (ref 4.0–10.5)
nRBC: 1.4 % — ABNORMAL HIGH (ref 0.0–0.2)

## 2020-06-13 LAB — CMP (CANCER CENTER ONLY)
ALT: 22 U/L (ref 0–44)
AST: 20 U/L (ref 15–41)
Albumin: 3.7 g/dL (ref 3.5–5.0)
Alkaline Phosphatase: 99 U/L (ref 38–126)
Anion gap: 10 (ref 5–15)
BUN: 7 mg/dL (ref 6–20)
CO2: 27 mmol/L (ref 22–32)
Calcium: 10.3 mg/dL (ref 8.9–10.3)
Chloride: 105 mmol/L (ref 98–111)
Creatinine: 0.86 mg/dL (ref 0.44–1.00)
GFR, Est AFR Am: >60 mL/min (ref >60)
GFR, Estimated: >60 mL/min (ref >60)
Glucose, Bld: 134 mg/dL — ABNORMAL HIGH (ref 70–99)
Potassium: 3.7 mmol/L (ref 3.5–5.1)
Sodium: 142 mmol/L (ref 135–145)
Total Bilirubin: 0.3 mg/dL (ref 0.3–1.2)
Total Protein: 6.5 g/dL (ref 6.5–8.1)

## 2020-06-13 MED ORDER — ACYCLOVIR 400 MG PO TABS
400.0000 mg | ORAL_TABLET | Freq: Two times a day (BID) | ORAL | 6 refills | Status: DC
Start: 2020-06-13 — End: 2020-12-12

## 2020-06-13 NOTE — Progress Notes (Signed)
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 female with a past medical history significant for hepatitis B, HIV, chronic anemia who presented to [place] with abdominal pain.  She had a 2-week history of early satiety, poor appetite and, 5 pound weight loss.  Abdominal pain is typically postprandial and mainly at night after dinner while laying in bed.  Has been associated with nausea improves after vomiting.  Labs on admission showed a hemoglobin of 9.3, MCV 67.4, platelet count 554,000.  Lipase was mildly elevated at 123.  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  Husband was at the bedside at the time of the visit.  The patient reports that she is having ongoing abdominal discomfort today.  Pain is primarily in her epigastric area and radiates to the left side of her abdomen.  States pain is worse when laying down after she eats.  She states that she has had this pain for at least 2 weeks.  Her abdominal pain is worse after eating.  She has had some nausea and intermittent vomiting.  Vomiting makes her pain better.  She was attributing her symptoms to some the medications that she was taking for fertility treatments.  She reports a poor appetite and about a 5 pound weight loss.  She is not having any  fevers or chills.  Denies night sweats.  She has not noticed any palpable lymphadenopathy.  She denies headaches, dizziness, chest pain, shortness of breath, diarrhea.  She reports intermittent constipation.  Denies bleeding or lower extremity edema.  The patient is married and has no children.  She currently works as an Therapist, sports.  Denies alcohol tobacco use.  Family history significant for paternal grandmother with uterine cancer.  Medical oncology was asked see the patient to make recommendations regarding her abnormal CT scan findings.  INTERVAL HISTORY:  Ms. Melendrez is a 44 year old female who is here for evaluation and management of large b cell lymphoma. We are joined today by her husband The patient's last visit with [place] was on 05/23/2020. The pt reports that she is doing well overall.  The pt reports no acute new concerns. Minimal oral sores - now resolved.  Of note since the patient's last visit, pt has had PET/CT ([id number]) completed on 06/10/2020 with results revealing "1. Marked reduction in size and metabolic activity of splenic mass. Residual mass has mild metabolic activity slightly greater than liver activity (Deauville 4). 2. No new metastatic disease. 3. Diffuse marrow activity related to GCSF type response."  Lab results today (06/13/20) stable  MEDICAL HISTORY:  Past Medical History:  Diagnosis Date  . Anemia   . Diffuse large B-cell lymphoma of lymph nodes of multiple regions ([place]) 04/12/2020  . 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 History of blood transfusion    childhood  . HIV (human immunodeficiency virus infection) ([place])   . Immune  deficiency disorder [facility])     SURGICAL HISTORY: Past Surgical History:  Procedure Laterality Date  . CHROMOPERTUBATION Bilateral 11/29/2016   Procedure: CHROMOPERTUBATION;  Surgeon: Waymon Amato, MD;  Location: [facility];  Service: Gynecology;  Laterality: Bilateral;  fallopian tubes  . ENDOMETRIAL BIOPSY    . IR IMAGING GUIDED PORT  INSERTION  04/15/2020  . IR THORACENTESIS ASP PLEURAL SPACE W/IMG GUIDE  04/15/2020  . MYOMECTOMY N/A 11/29/2016   Procedure: MYOMECTOMY;  Surgeon: Waymon Amato, MD;  Location: [facility];  Service: Gynecology;  Laterality: N/A;    SOCIAL HISTORY: Social History   Socioeconomic History  . Marital status: Married    Spouse name: Not on file  . Number of children: Not on file  . Years of education: Not on file  . Highest education level: Not on file  Occupational History  . Not on file  Tobacco Use  . Smoking status: Never Smoker  . Smokeless tobacco: Never Used  Substance and Sexual Activity  . Alcohol use: Yes    Comment: occ  . Drug use: No  . Sexual activity: Yes  Other Topics Concern  . Not on file  Social History Narrative  . Not on file   Social Determinants of Health   Financial Resource Strain:   . Difficulty of Paying Living Expenses:   Food Insecurity:   . Worried About Charity fundraiser in the Last Year:   . Arboriculturist in the Last Year:   Transportation Needs:   . Film/video editor (Medical):   Marland Kitchen Lack of Transportation (Non-Medical):   Physical Activity:   . Days of Exercise per Week:   . Minutes of Exercise per Session:   Stress:   . Feeling of Stress :   Social Connections:   . Frequency of Communication with Friends and Family:   . Frequency of Social Gatherings with Friends and Family:   . Attends Religious Services:   . Active Member of Clubs or Organizations:   . Attends Archivist Meetings:   Marland Kitchen Marital Status:   Intimate Partner Violence:   . Fear of Current or Ex-Partner:   . Emotionally Abused:   Marland Kitchen Physically Abused:   . Sexually Abused:     FAMILY HISTORY: Family History  Problem Relation Age of Onset  . Diabetes Mother   . Hypertension Mother     ALLERGIES:  has No Known Allergies.  MEDICATIONS:  Current Outpatient Medications  Medication Sig Dispense Refill  . acetaminophen (TYLENOL) 325 MG tablet Take 2 tablets  (650 mg total) by mouth every 4 (four) hours as needed for mild pain.    . B Complex Vitamins (B COMPLEX PO) Take 1 tablet by mouth daily.    . bictegravir-emtricitabine-tenofovir AF (BIKTARVY) 50-200-25 MG TABS tablet Take 1 tablet by mouth daily with breakfast.     . cholecalciferol (VITAMIN D3) 25 MCG (1000 UNIT) tablet Take 1 tablet (1,000 Units total) by mouth at bedtime.    . feeding supplement, ENSURE ENLIVE, (ENSURE ENLIVE) LIQD Take 237 mLs by mouth 2 (two) times daily between meals. 237 mL 12  . HYDROcodone-acetaminophen (NORCO) 5-325 MG tablet Take 1 tablet by mouth every 6 (six) hours as needed for moderate pain. 30 tablet 0  . ibuprofen (ADVIL) 200 MG tablet Take 2 tablets (400 mg total) by mouth every 6 (six) hours as needed for mild pain. 30 tablet 0  . lidocaine-prilocaine (EMLA) cream Apply to affected area once (Patient taking differently:  Apply 1 application topically as needed (port access). Apply to affected area once) 30 g 3  . LORazepam (ATIVAN) 0.5 MG tablet Take 1 tablet (0.5 mg total) by mouth every 6 (six) hours as needed (for chemo induced nausea or vomiting). 30 tablet 0  . Multiple Vitamin (MULTIVITAMIN WITH MINERALS) TABS tablet Take 1 tablet by mouth at bedtime.     . ondansetron (ZOFRAN) 8 MG tablet Take 1 tablet (8 mg total) by mouth every 8 (eight) hours as needed for nausea or vomiting. 20 tablet 1  . polyethylene glycol (MIRALAX / GLYCOLAX) 17 g packet Take 17 g by mouth daily as needed for mild constipation. (Patient taking differently: Take 17 g by mouth daily. ) 14 each 0  . potassium chloride SA (KLOR-CON) 20 MEQ tablet Take 1 tablet (20 mEq total) by mouth daily. Take for 5 days following hospital discharge. 5 tablet 0  . prochlorperazine (COMPAZINE) 10 MG tablet Take 1 tablet (10 mg total) by mouth every 6 (six) hours as needed for nausea or vomiting. 30 tablet 1  . senna-docusate (SENOKOT-S) 8.6-50 MG tablet Take 2 tablets by mouth at bedtime as needed for  mild constipation.    . Sodium Chloride-Sodium Bicarb (SODIUM BICARBONATE/SODIUM CHLORIDE) SOLN 1 application by Mouth Rinse route 4 (four) times daily.     No current facility-administered medications for this visit.    REVIEW OF SYSTEMS:   A 10+ POINT REVIEW OF SYSTEMS WAS OBTAINED including neurology, dermatology, psychiatry, cardiac, respiratory, lymph, extremities, GI, GU, Musculoskeletal, constitutional, breasts, reproductive, HEENT.  All pertinent positives are noted in the HPI.  All others are negative.   PHYSICAL EXAMINATION: ECOG PERFORMANCE STATUS: 1 - Symptomatic but completely ambulatory  . There were no vitals filed for this visit. There were no vitals filed for this visit. .There is no height or weight on file to calculate BMI.   NAD . GENERAL:alert, in no acute distress and comfortable SKIN: no acute rashes, no significant lesions EYES: conjunctiva are pink and non-injected, sclera anicteric OROPHARYNX: MMM, no exudates, no oropharyngeal erythema or ulceration NECK: supple, no JVD LYMPH:  no palpable lymphadenopathy in the cervical, axillary or inguinal regions LUNGS: clear to auscultation b/l with normal respiratory effort HEART: regular rate & rhythm ABDOMEN:  normoactive bowel sounds , non tender, not distended. Extremity: no pedal edema PSYCH: alert & oriented x 3 with fluent speech NEURO: no focal motor/sensory deficits   LABORATORY DATA:  I have reviewed the data as listed  . CBC Latest Ref Rng & Units 06/13/2020 06/03/2020 06/02/2020  WBC 4.0 - 10.5 K/uL 42.0(H) 2.4(L) 4.7  Hemoglobin 12.0 - 15.0 g/dL 9.9(L) 10.6(L) 10.5(L)  Hematocrit 36 - 46 % 31.6(L) 33.5(L) 33.3(L)  Platelets 150 - 400 K/uL 147(L) 316 278    . CMP Latest Ref Rng & Units 06/13/2020 06/03/2020 06/02/2020  Glucose 70 - 99 mg/dL 134(H) 119(H) 145(H)  BUN 6 - 20 mg/dL 7 13 12   Creatinine 0.44 - 1.00 mg/dL 0.86 0.53 0.45  Sodium 135 - 145 mmol/L 142 136 138  Potassium 3.5 - 5.1 mmol/L  3.7 3.7 3.5  Chloride 98 - 111 mmol/L 105 99 102  CO2 22 - 32 mmol/L 27 28 28   Calcium 8.9 - 10.3 mg/dL 10.3 9.6 9.8  Total Protein 6.5 - 8.1 g/dL 6.5 7.0 6.8  Total Bilirubin 0.3 - 1.2 mg/dL 0.3 0.6 0.4  Alkaline Phos 38 - 126 U/L 99 67 72  AST 15 - 41 U/L 20 28 15  ALT 0 - 44 U/L 22 37 17   04/08/2020 Pancreatic Mass Surgical Pathology Report [id number]     RADIOGRAPHIC STUDIES: I have personally reviewed the radiological images as listed and agreed with the findings in the report. NM PET Image Restag (PS) Skull Base To Thigh  Result Date: 06/11/2020 CLINICAL DATA:  Subsequent treatment strategy for diffuse large B-cell lymphoma. EXAM: NUCLEAR MEDICINE PET SKULL BASE TO THIGH TECHNIQUE: 6.4 mCi F-18 FDG was injected intravenously. Full-ring PET imaging was performed from the skull base to thigh after the radiotracer. CT data was obtained and used for attenuation correction and anatomic localization. Fasting blood glucose: 101 mg/dl COMPARISON:  None. FINDINGS: Mediastinal blood pool activity: SUV max 1.9 Liver activity: SUV max 2.7 NECK: No hypermetabolic lymph nodes in the neck. Incidental CT findings: none CHEST: No hypermetabolic mediastinal or hilar nodes. No suspicious pulmonary nodules on the CT scan. Incidental CT findings: none ABDOMEN/PELVIS: Marked reduction in volume and metabolic activity of splenic mass. Mass currently measures 6.0 by 6.4 cm decreased from 13.3 by 10.0 cm. Metabolic activity is greatly reduced with mild heterogeneous metabolic activity within the mass with SUV max equal 4.5 decreased from SUV max equal 15.7. No hypermetabolic abdominopelvic lymph nodes. Incidental CT findings: Fibroid uterus. Abdominal wall skin lesions again noted. SKELETON: There is diffuse intense marrow activity consistent with GCSF type response. Incidental CT findings: none IMPRESSION: 1. Marked reduction in size and metabolic activity of splenic mass. Residual mass has mild metabolic  activity slightly greater than liver activity ( Deauville 4). 2. No new metastatic disease. 3. Diffuse marrow activity related to GCSF type response. Electronically Signed   By: Suzy Bouchard M.D.   On: 06/11/2020 10:37   DG FLUORO GUIDE LUMBAR PUNCTURE  Result Date: 05/31/2020 CLINICAL DATA:  Large B-cell lymphoma. EXAM: DIAGNOSTIC LUMBAR PUNCTURE UNDER FLUOROSCOPIC GUIDANCE WITH CHEMOTHERAPY INJECTION FLUOROSCOPY TIME:  Fluoroscopy Time:  48 seconds Radiation Exposure Index (if provided by the fluoroscopic device): 14.6 mGy Number of Acquired Spot Images: 0 PROCEDURE: Informed consent was obtained from the patient prior to the procedure, including potential complications of headache, allergy, and pain. With the patient prone, the lower back was prepped with Betadine. 1% Lidocaine was used for local anesthesia. Lumbar puncture was performed at the L3-4 level using a 20 gauge needle with return of clear CSF. Eight ml of CSF were obtained for laboratory studies. Subsequently, 12 mg of methotrexate and 50 mg of hydrocortisone were slowly injected into the thecal sac. The patient described pain with injection, but CSF flow was good both prior and subsequent to methotrexate injection. IMPRESSION: Lumbar puncture for intrathecal chemotherapy as detailed above. Electronically Signed   By: Abigail Miyamoto M.D.   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 Hemoglobin C], childhood malaria. Patient notes that she had a CT of the abdomen sometime in 2020 that showed no acute pathology.This was not [facility]. Not accessible to [place] at this point. She did have an MRI of the abdomen in July 2019 which showed indeterminate splenic  lesions.No other acute abdominal pathology.No concern with hepatocellular carcinoma.  Patient is presenting now with  #1Pancreatic mass along with retroperitoneal lymphadenopathy and diffuse splenic lesions. No internal necrosis within the mass noted. Diffuse splenomegaly.  Left periaortic lymph node causing mild displacement of the third portion of the duodenum.  Significantly elevated LDH levels. CA 19-9 and CEA levels unrevealing  # 2 Left sided large pleural effusion and lung atelectasis. S/p diagnostic and therapeutic thoracentesis -- lymphocytic predominance @ 80%  Overall picture concerning for possible  High grade B cell lymphoma vs primary pancreatic malignancy (though CA 19-9 levels indeterminate and not significantly elevated.  HIV could be risk factors for high grade EBV driven lymphomas  Patient symptomatology has developed rather quickly over the last few weeks.  #3 microcytic anemia-chronic.Some element of iron deficiency versus hemoglobinopathy versus anemia of chronic disease related to malignancy. Patient has received 1 dose of IV Feraheme Will rpt 2nd dose in 1 week.  #4 history of HIV/AIDS follows with Dr. Melynda Keller and at [facility].  # Leucocytosis - due to G-CSF PLAN: - labs stable -PET/Ct reviewed with patient - shows excellent rsponse based on treatment received to date -The pt has no prohibitive toxicities from continuing C4 EPOCH in 1 week.  IT MTX on day 2 C4  -pre admission covid testing -acyclovir for HSV prphylaxis -Inpatient [facility] chemotherapy for 5 days from 8/16 Outpatient Rituxan and Udenyca on 06/27/2020 RTC with Dr Irene Limbo with labs on 07/04/2020    The total time spent in the appt was 30 minutes and more than 50% was on counseling and direct patient cares.  All of the patient's questions were answered with apparent satisfaction. The patient knows to call the clinic with any problems, questions or  concerns.   Sullivan Lone MD [place] AAHIVMS [facility] Hematology/Oncology Physician [facility]  (Office):       [phone] (Work cell):  [phone] (Fax):           [phone]  06/13/2020 7:23 AM  I, Yevette Edwards, am acting as a scribe for Dr. Sullivan Lone.   .I have reviewed the above documentation for accuracy and completeness, and I agree with the above. Brunetta Genera MD

## 2020-06-14 ENCOUNTER — Telehealth: Payer: Self-pay | Admitting: *Deleted

## 2020-06-14 NOTE — Telephone Encounter (Signed)
Patient scheduled per Dr.Kale for inpatient admission 8/16/21for 5 day EPOCH. Dawnin bed placement contacted with patient diagnosis and contact information. Patient will be contacted with admission time on 06/20/20. Covid screening test scheduled for8/14/21 @ 1000AM [employer] testing site. Patientcontacted, left voice mail tonotifyof all information andscheduled appointments Email sent to Inpatient Chemotherapy group to notify of upcoming admission

## 2020-06-18 ENCOUNTER — Other Ambulatory Visit (HOSPITAL_COMMUNITY)
Admission: RE | Admit: 2020-06-18 | Discharge: 2020-06-18 | Disposition: A | Discharge status: Home / Self Care | Payer: 59 | Source: Ambulatory Visit | Attending: Hematology | Admitting: Hematology

## 2020-06-18 DIAGNOSIS — Z20822 Contact with and (suspected) exposure to covid-19: Secondary | ICD-10-CM | POA: Insufficient documentation

## 2020-06-18 DIAGNOSIS — Z01812 Encounter for preprocedural laboratory examination: Secondary | ICD-10-CM | POA: Insufficient documentation

## 2020-06-18 LAB — SARS CORONAVIRUS 2 (TAT 6-24 HRS): SARS Coronavirus 2: NEGATIVE

## 2020-06-20 ENCOUNTER — Other Ambulatory Visit: Payer: Self-pay

## 2020-06-20 ENCOUNTER — Telehealth: Payer: Self-pay | Admitting: Hematology

## 2020-06-20 ENCOUNTER — Encounter (HOSPITAL_COMMUNITY): Payer: Self-pay | Admitting: Hematology

## 2020-06-20 ENCOUNTER — Inpatient Hospital Stay (HOSPITAL_COMMUNITY)
Admission: AD | Admit: 2020-06-20 | Discharge: 2020-06-24 | DRG: 975 | Disposition: A | Discharge status: Home / Self Care | Payer: 59 | Source: Ambulatory Visit | Attending: Hematology | Admitting: Hematology

## 2020-06-20 DIAGNOSIS — Z79891 Long term (current) use of opiate analgesic: Secondary | ICD-10-CM

## 2020-06-20 DIAGNOSIS — Z79899 Other long term (current) drug therapy: Secondary | ICD-10-CM

## 2020-06-20 DIAGNOSIS — K5900 Constipation, unspecified: Secondary | ICD-10-CM | POA: Diagnosis present

## 2020-06-20 DIAGNOSIS — Z5111 Encounter for antineoplastic chemotherapy: Secondary | ICD-10-CM | POA: Diagnosis not present

## 2020-06-20 DIAGNOSIS — Z7189 Other specified counseling: Secondary | ICD-10-CM

## 2020-06-20 DIAGNOSIS — J9811 Atelectasis: Secondary | ICD-10-CM | POA: Diagnosis present

## 2020-06-20 DIAGNOSIS — Z20822 Contact with and (suspected) exposure to covid-19: Secondary | ICD-10-CM | POA: Diagnosis present

## 2020-06-20 DIAGNOSIS — Z8249 Family history of ischemic heart disease and other diseases of the circulatory system: Secondary | ICD-10-CM

## 2020-06-20 DIAGNOSIS — C8330 Diffuse large B-cell lymphoma, unspecified site: Secondary | ICD-10-CM | POA: Diagnosis not present

## 2020-06-20 DIAGNOSIS — B20 Human immunodeficiency virus [HIV] disease: Secondary | ICD-10-CM | POA: Diagnosis present

## 2020-06-20 DIAGNOSIS — J392 Other diseases of pharynx: Secondary | ICD-10-CM | POA: Diagnosis not present

## 2020-06-20 DIAGNOSIS — J90 Pleural effusion, not elsewhere classified: Secondary | ICD-10-CM | POA: Diagnosis present

## 2020-06-20 DIAGNOSIS — C7889 Secondary malignant neoplasm of other digestive organs: Secondary | ICD-10-CM | POA: Diagnosis present

## 2020-06-20 DIAGNOSIS — C8338 Diffuse large B-cell lymphoma, lymph nodes of multiple sites: Principal | ICD-10-CM | POA: Diagnosis present

## 2020-06-20 DIAGNOSIS — B181 Chronic viral hepatitis B without delta-agent: Secondary | ICD-10-CM | POA: Diagnosis present

## 2020-06-20 DIAGNOSIS — Z8613 Personal history of malaria: Secondary | ICD-10-CM

## 2020-06-20 DIAGNOSIS — R109 Unspecified abdominal pain: Secondary | ICD-10-CM | POA: Diagnosis present

## 2020-06-20 DIAGNOSIS — Z833 Family history of diabetes mellitus: Secondary | ICD-10-CM | POA: Diagnosis not present

## 2020-06-20 DIAGNOSIS — D508 Other iron deficiency anemias: Secondary | ICD-10-CM | POA: Diagnosis present

## 2020-06-20 LAB — CBC WITH DIFFERENTIAL/PLATELET
Abs Immature Granulocytes: 0.31 10*3/uL — ABNORMAL HIGH (ref 0.00–0.07)
Basophils Absolute: 0.1 10*3/uL (ref 0.0–0.1)
Basophils Relative: 1 %
Eosinophils Absolute: 0.1 10*3/uL (ref 0.0–0.5)
Eosinophils Relative: 2 %
HCT: 31 % — ABNORMAL LOW (ref 36.0–46.0)
Hemoglobin: 9.9 g/dL — ABNORMAL LOW (ref 12.0–15.0)
Immature Granulocytes: 4 %
Lymphocytes Relative: 10 %
Lymphs Abs: 0.8 10*3/uL (ref 0.7–4.0)
MCH: 26.8 pg (ref 26.0–34.0)
MCHC: 31.9 g/dL (ref 30.0–36.0)
MCV: 83.8 fL (ref 80.0–100.0)
Monocytes Absolute: 0.6 10*3/uL (ref 0.1–1.0)
Monocytes Relative: 8 %
Neutro Abs: 5.5 10*3/uL (ref 1.7–7.7)
Neutrophils Relative %: 75 %
Platelets: 240 10*3/uL (ref 150–400)
RBC: 3.7 MIL/uL — ABNORMAL LOW (ref 3.87–5.11)
WBC: 7.3 10*3/uL (ref 4.0–10.5)
nRBC: 1 % — ABNORMAL HIGH (ref 0.0–0.2)

## 2020-06-20 LAB — COMPREHENSIVE METABOLIC PANEL
ALT: 21 U/L (ref 0–44)
AST: 20 U/L (ref 15–41)
Albumin: 3.8 g/dL (ref 3.5–5.0)
Alkaline Phosphatase: 77 U/L (ref 38–126)
Anion gap: 13 (ref 5–15)
BUN: 14 mg/dL (ref 6–20)
CO2: 26 mmol/L (ref 22–32)
Calcium: 9.8 mg/dL (ref 8.9–10.3)
Chloride: 103 mmol/L (ref 98–111)
Creatinine, Ser: 0.65 mg/dL (ref 0.44–1.00)
GFR calc Af Amer: >60 mL/min (ref >60)
GFR calc non Af Amer: >60 mL/min (ref >60)
Glucose, Bld: 166 mg/dL — ABNORMAL HIGH (ref 70–99)
Potassium: 3.8 mmol/L (ref 3.5–5.1)
Sodium: 142 mmol/L (ref 135–145)
Total Bilirubin: 0.5 mg/dL (ref 0.3–1.2)
Total Protein: 6.7 g/dL (ref 6.5–8.1)

## 2020-06-20 LAB — PREGNANCY, URINE: Preg Test, Ur: NEGATIVE

## 2020-06-20 MED ORDER — CHLORHEXIDINE GLUCONATE CLOTH 2 % EX PADS
6.0000 | MEDICATED_PAD | Freq: Every day | CUTANEOUS | Status: DC
  Administered 2020-06-21 – 2020-06-24 (×4): 6 via TOPICAL

## 2020-06-20 MED ORDER — SODIUM CHLORIDE 0.9 % IV SOLN
Freq: Once | INTRAVENOUS | Status: AC
  Administered 2020-06-20: 18 mg via INTRAVENOUS
  Filled 2020-06-20: qty 4

## 2020-06-20 MED ORDER — PREDNISONE 20 MG PO TABS
60.0000 mg | ORAL_TABLET | Freq: Every day | ORAL | Status: AC
  Administered 2020-06-20 – 2020-06-24 (×5): 60 mg via ORAL
  Filled 2020-06-20 (×6): qty 3

## 2020-06-20 MED ORDER — VINCRISTINE SULFATE CHEMO INJECTION 1 MG/ML
Freq: Once | INTRAVENOUS | Status: AC
  Filled 2020-06-20: qty 8

## 2020-06-20 MED ORDER — PROCHLORPERAZINE MALEATE 10 MG PO TABS: 10.0000 mg | ORAL_TABLET | Freq: Four times a day (QID) | ORAL | Status: DC | PRN

## 2020-06-20 MED ORDER — SODIUM CHLORIDE 0.9% FLUSH
10.0000 mL | Freq: Two times a day (BID) | INTRAVENOUS | Status: DC
  Administered 2020-06-20: 20 mL
  Administered 2020-06-21 – 2020-06-24 (×4): 10 mL

## 2020-06-20 MED ORDER — COLD PACK MISC ONCOLOGY
1.0000 | Freq: Once | Status: DC | PRN
  Filled 2020-06-20: qty 1

## 2020-06-20 MED ORDER — HOT PACK MISC ONCOLOGY
1.0000 | Freq: Once | Status: DC | PRN
  Filled 2020-06-20: qty 1

## 2020-06-20 MED ORDER — HEPARIN SOD (PORK) LOCK FLUSH 100 UNIT/ML IV SOLN
500.0000 [IU] | Freq: Once | INTRAVENOUS | Status: DC | PRN
  Filled 2020-06-20: qty 5

## 2020-06-20 MED ORDER — HEPARIN SOD (PORK) LOCK FLUSH 100 UNIT/ML IV SOLN: 250.0000 [IU] | Freq: Once | INTRAVENOUS | Status: DC | PRN

## 2020-06-20 MED ORDER — SODIUM CHLORIDE 0.9 % IV SOLN: INTRAVENOUS | Status: DC

## 2020-06-20 MED ORDER — SODIUM CHLORIDE 0.9% FLUSH: 10.0000 mL | INTRAVENOUS | Status: DC | PRN

## 2020-06-20 MED ORDER — ALTEPLASE 2 MG IJ SOLR
2.0000 mg | Freq: Once | INTRAMUSCULAR | Status: DC | PRN
  Filled 2020-06-20: qty 2

## 2020-06-20 MED ORDER — SODIUM CHLORIDE 0.9% FLUSH: 3.0000 mL | INTRAVENOUS | Status: DC | PRN

## 2020-06-20 NOTE — Progress Notes (Signed)
Patient due to begin chemotherapy today.  Chemotherapy dosages, total VTBI, 2 pharmacy signatures, expiration dates, and patient identifiers independently verified by me and by Aldean Baker, RN.  Zandra Abts [facility]  06/20/2020  2:45 PM

## 2020-06-20 NOTE — H&P (Addendum)
[place]  [phone] Fax:([phone]) [phone]   MEDICAL ONCOLOGY - ADMISSION H&P  Reason for Referral: Cycle #4 EPOCH-R  HPI: Heather [name] is a wonderful 44 y.o. female with a past medical history significant for hepatitis B, HIV, chronic anemia who presented to [place] with abdominal pain. She had a 2-week history of early satiety, poor appetite and, 5 pound weight loss. Abdominal pain is typically postprandial and mainly at night after dinner while laying in bed. Has been associated with nausea improves after vomiting. Labs on admission showed a hemoglobin of 9.3, MCV 67.4, platelet count 554,000. Lipase was mildly elevated at 123. 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  During her initial consult, she was having ongoingpain is primarily in her epigastric area and radiates to the left side of her abdomen. States pain is worse when laying down after she eats. She states that she has had this pain for at least 2 weeks. Her abdominal pain is worse after eating. She has had some nausea and intermittent vomiting. Vomiting makes her pain better. She was attributing her symptoms to some the medications that she was taking for fertility treatments. She reports a poor appetite and about a 5 pound weight loss. She is not having any fevers or chills. Denies night sweats. She has not noticed any palpable lymphadenopathy. The patient underwent a biopsy of her pancreatic mass on 04/08/2020 ([id number]) with results feeling diffuse  large B-cell lymphoma.  It was recommended for her to undergo systemic chemotherapy with EPOCH-R.   The patient reports that she is feeling well overall today.  Denies mucositis, abdominal pain, nausea, vomiting.  Denies chest pain or shortness of breath. No bleeding reported.  Appetite remains good. She is here for admission for cycle #4 of EPOCH-R.      Past Medical History:  Diagnosis Date  . Anemia   . Diffuse large B-cell lymphoma of lymph nodes of multiple regions ([place]) 04/12/2020  . 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 History of blood transfusion    childhood  . HIV (human immunodeficiency virus infection) ([place])   . Immune deficiency disorder ([place])                            04/15/2020  . MYOMECTOMY N/A 11/29/2016   Procedure: MYOMECTOMY;  Surgeon: Waymon Amato, MD;  Location: [facility];  Service: Gynecology;  Laterality: N/A;    Medication Dose Route Frequency Provider Last Rate Last Admin  . acetaminophen (TYLENOL) tablet 650 mg  650 mg Oral Q4H PRN Maryanna Shape, NP      . Derrill Memo ON 05/31/2020] bictegravir-emtricitabine-tenofovir AF (BIKTARVY) 50-200-25 MG per tablet 1 tablet  1 tablet Oral Q breakfast Curcio, Roselie Awkward, NP      . Chlorhexidine Gluconate Cloth 2 % PADS 6 each  6 each Topical Daily Curcio, Kristin R, NP      . cholecalciferol (VITAMIN D3) tablet 1,000 Units  1,000 Units Oral QHS Curcio, Kristin R, NP      . feeding supplement (ENSURE ENLIVE) (ENSURE ENLIVE) liquid 237 mL  237 mL Oral BID BM Curcio, Kristin R, NP      . HYDROcodone-acetaminophen (NORCO/VICODIN) 5-325 MG per tablet 1 tablet  1 tablet Oral Q6H PRN Curcio, Roselie Awkward, NP      . lidocaine-prilocaine (EMLA) cream 1 application  1 application Topical PRN Curcio, Roselie Awkward, NP      . LORazepam (ATIVAN) tablet 0.5 mg  0.5 mg Oral Q6H PRN Curcio, Roselie Awkward, NP      . multivitamin with minerals tablet 1 tablet  1 tablet Oral QHS Curcio, Kristin R, NP      . ondansetron (ZOFRAN) tablet 8 mg  8 mg Oral  Q8H PRN Curcio, Kristin R, NP      . pantoprazole (PROTONIX) EC tablet 40 mg  40 mg Oral Daily Curcio, Kristin R, NP      . polyethylene glycol (MIRALAX / GLYCOLAX) packet 17 g  17 g Oral Daily PRN Curcio, Roselie Awkward, NP      . potassium chloride SA (KLOR-CON) CR tablet 20 mEq  20 mEq Oral Once Maryanna Shape, NP      . prochlorperazine (COMPAZINE) tablet 10 mg  10 mg Oral Q6H PRN Curcio, Kristin R, NP      . sodium bicarbonate/sodium chloride mouthwash 0814GY  1 application Mouth Rinse QID Curcio, Kristin R, NP      . sodium chloride flush (NS) 0.9 % injection 10-40 mL  10-40 mL Intracatheter Q12H Curcio, Kristin R, NP      . sodium chloride flush (NS) 0.9 % injection 10-40 mL  10-40 mL Intracatheter PRN Curcio, Roselie Awkward, NP           Family History  Problem Relation Age of Onset  . Diabetes Mother   . Hypertension Mother   Relation Age of Onset  . Diabetes Mother   . Hypertension Mother      Socioeconomic History  . Marital status: Married    Spouse name: Not on file  . Number of children: Not on file  . Years of education: Not on file  . Highest education level: Not on file  Occupational History  . Not on file  Tobacco Use  . Smoking status: Never Smoker  . Smokeless tobacco: Never Used  Substance and Sexual Activity  . Alcohol use: Yes    Comment: occ  . Drug use: No  . Sexual activity: Yes  Other Topics Concern  . Not on file  Social History Narrative  . Not on file   Social Determinants of Health   Financial Resource Strain:   . Difficulty of Paying Living Expenses:   Food Insecurity:   . Worried About Charity fundraiser in the Last Year:   . Arboriculturist in the Last Year:   Transportation Needs:   . Film/video editor (Medical):   Marland Kitchen Lack of Transportation (Non-Medical):   Physical Activity:   . Days of Exercise per Week:   . Minutes of Exercise per Session:   Stress:   . Feeling of Stress :   Social Connections:   . Frequency of Communication  with Friends and Family:   . Frequency of Social Gatherings with Friends and Family:   . Attends Religious Services:   . Active Member of Clubs or Organizations:   . Attends Archivist Meetings:   Marland Kitchen Marital Status:   Intimate Partner Violence:   . Fear of Current or Ex-Partner:   . Emotionally Abused:   Marland Kitchen Physically Abused:   .  Sexually Abused:   :  Review of Systems: A comprehensive 14 point review of systems was negative except as noted in the HPI.  Exam: Patient Vitals for the past 24 hrs:  BP Temp Temp src Pulse Resp SpO2 Height Weight  05/30/20 1127 119/85 98.3 F (36.8 C) Oral 93 16 99 % 5' (1.524 m) 58.3 kg    General:  well-nourished in no acute distress.   Eyes:  no scleral icterus.   ENT:  There were no oropharyngeal lesions.   Neck was without thyromegaly.   Lymphatics:  Negative cervical, supraclavicular or axillary adenopathy.   Respiratory: Minutes left base. Cardiovascular:  Regular rate and rhythm, S1/S2, without murmur, rub or gallop.  There was no pedal edema.   GI:  abdomen was soft, flat, nontender, nondistended, without organomegaly. Musculoskeletal:  no spinal tenderness of palpation of vertebral spine.   Skin: No bruising or petechiae.  Keloids noted over her chest and abdomen. Neuro exam was nonfocal. Patient was alert and oriented.  Attention was good.   Language was appropriate.  Mood was normal without depression.  Speech was not pressured.  Thought content was not tangential.     Lab Results  Component Value Date   WBC 7.4 05/30/2020   HGB 10.0 (L) 05/30/2020   HCT 32.0 (L) 05/30/2020   PLT 233 05/30/2020   GLUCOSE 87 05/30/2020   ALT 20 05/30/2020   AST 17 05/30/2020   NA 138 05/30/2020   K 3.8 05/30/2020   CL 101 05/30/2020   CREATININE 0.53 05/30/2020   BUN 11 05/30/2020   CO2 27 05/30/2020    DG Chest 2 View  Result Date: 05/12/2020 CLINICAL DATA:  Chronic anemia with weight loss and decreased appetite EXAM: CHEST - 2 VIEW  COMPARISON:  04/14/2020 FINDINGS: Cardiac shadows within normal limits. Right chest wall port is noted in satisfactory position. Small left pleural effusion is noted but decreased from the prior exam. Underlying atelectatic changes are noted. No bony abnormality is seen. IMPRESSION: Decrease in the degree of left-sided pleural effusion when compared with the prior exam. Electronically Signed   By: Inez Catalina M.D.   On: 05/12/2020 19:01     DG Chest 2 View  Result Date: 05/12/2020 CLINICAL DATA:  Chronic anemia with weight loss and decreased appetite EXAM: CHEST - 2 VIEW COMPARISON:  04/14/2020 FINDINGS: Cardiac shadows within normal limits. Right chest wall port is noted in satisfactory position. Small left pleural effusion is noted but decreased from the prior exam. Underlying atelectatic changes are noted. No bony abnormality is seen. IMPRESSION: Decrease in the degree of left-sided pleural effusion when compared with the prior exam. Electronically Signed   By: Inez Catalina M.D.   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 Hemoglobin C], childhood malaria. Patient notes that she had a CT of the abdomen sometime in 2020 that showed no acute pathology.This was not [facility]. Not accessible to [place] at this point. She did have an MRI of the abdomen in July 2019 which showed indeterminate splenic lesions.No other acute abdominal pathology.No concern with hepatocellular carcinoma.  Patient is presenting now with  #1Pancreatic massalong with retroperitoneal lymphadenopathy and diffuse splenic lesions.due to diffuse large b cell lymphoma No internal necrosis  within the mass noted. Diffuse  splenomegaly. Left periaortic lymph node causing mild displacement of the third portion of the duodenum.  Significantly elevated LDH levels. 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  PET scan on 06/11/2020 following 3 cycles of chemotherapy showed "1. Marked reduction in size and metabolic activity of splenic mass. Residual mass has mild metabolic activity slightly greater than liver activity ( Deauville 4). 2. No new metastatic disease. 3. Diffuse marrow activity related to GCSF type response."  # 2 Left sided large pleural effusion and lung atelectasis. S/p diagnostic and therapeutic thoracentesis -- lymphocytic predominance @ 80%  HIV could be risk factors for high grade EBV driven lymphomas  Patient symptomatology has developed rather quickly over the last few weeks.  #67microcytic anemia-chronic.Some element of iron deficiency versus hemoglobinopathy versus anemia of chronic disease related to malignancy. Patient has received 1 dose of IV Feraheme on 04/08/2020 and repeated on 04/22/2020  #4history of HIV/AIDS follows with Dr. Melynda Keller and at [facility].   PLAN: -CBC with differential and CMET from today have been reviewed and are adequate for treatment.  Urine pregnancy test negative.  She will proceed with day 1 of cycle 4 of chemotherapy today as planned. -Check daily CBC with differential and CMET -Continue as needed antiemetics. -MiraLAX and Senokot-S have been ordered. -Will need to be cautious with rituximab to prevent recurrent hepatitis B flare.  Repeat HBV DNA undetectable on 06/02/2020. -continue with Biktarvy - no interactions - this was confirmed with patient ID physician and our pharmacy. -We will plan for intrathecal methotrexate on day 2 of the cycle of chemotherapy.  The patient is scheduled the cancer center on 06/28/2020 for Rituxan and Neulasta and will return  for a follow-up visit on 07/05/2020.  Mikey Bussing, DNP, AGPCNP-BC, AOCNP  Addendum  .Patient was Personally and independently interviewed, examined and relevant elements of the history of present illness were reviewed in details and an assessment and plan was created. All elements of the patient's history of present illness , assessment and plan were discussed in details with Mikey Bussing, DNP, AGPCNP-BC, AOCNP. The above documentation reflects our combined findings assessment and plan.  Sullivan Lone MD MS

## 2020-06-20 NOTE — Telephone Encounter (Signed)
Scheduled per los, patient has been called and notified of upcoming appointments. 

## 2020-06-21 ENCOUNTER — Inpatient Hospital Stay (HOSPITAL_COMMUNITY): Payer: 59

## 2020-06-21 LAB — COMPREHENSIVE METABOLIC PANEL
ALT: 22 U/L (ref 0–44)
AST: 17 U/L (ref 15–41)
Albumin: 3.5 g/dL (ref 3.5–5.0)
Alkaline Phosphatase: 76 U/L (ref 38–126)
Anion gap: 8 (ref 5–15)
BUN: 14 mg/dL (ref 6–20)
CO2: 24 mmol/L (ref 22–32)
Calcium: 9.4 mg/dL (ref 8.9–10.3)
Chloride: 105 mmol/L (ref 98–111)
Creatinine, Ser: 0.53 mg/dL (ref 0.44–1.00)
GFR calc Af Amer: >60 mL/min (ref >60)
GFR calc non Af Amer: >60 mL/min (ref >60)
Glucose, Bld: 175 mg/dL — ABNORMAL HIGH (ref 70–99)
Potassium: 4 mmol/L (ref 3.5–5.1)
Sodium: 137 mmol/L (ref 135–145)
Total Bilirubin: 0.2 mg/dL — ABNORMAL LOW (ref 0.3–1.2)
Total Protein: 6.5 g/dL (ref 6.5–8.1)

## 2020-06-21 LAB — CBC WITH DIFFERENTIAL/PLATELET
Abs Immature Granulocytes: 0.47 10*3/uL — ABNORMAL HIGH (ref 0.00–0.07)
Basophils Absolute: 0 10*3/uL (ref 0.0–0.1)
Basophils Relative: 0 %
Eosinophils Absolute: 0 10*3/uL (ref 0.0–0.5)
Eosinophils Relative: 0 %
HCT: 33.2 % — ABNORMAL LOW (ref 36.0–46.0)
Hemoglobin: 10.3 g/dL — ABNORMAL LOW (ref 12.0–15.0)
Immature Granulocytes: 3 %
Lymphocytes Relative: 3 %
Lymphs Abs: 0.4 10*3/uL — ABNORMAL LOW (ref 0.7–4.0)
MCH: 26.1 pg (ref 26.0–34.0)
MCHC: 31 g/dL (ref 30.0–36.0)
MCV: 84.1 fL (ref 80.0–100.0)
Monocytes Absolute: 0.2 10*3/uL (ref 0.1–1.0)
Monocytes Relative: 2 %
Neutro Abs: 12.5 10*3/uL — ABNORMAL HIGH (ref 1.7–7.7)
Neutrophils Relative %: 92 %
Platelets: 252 10*3/uL (ref 150–400)
RBC: 3.95 MIL/uL (ref 3.87–5.11)
WBC: 13.7 10*3/uL — ABNORMAL HIGH (ref 4.0–10.5)
nRBC: 0.2 % (ref 0.0–0.2)

## 2020-06-21 MED ORDER — LORAZEPAM 0.5 MG PO TABS: 0.5000 mg | ORAL_TABLET | Freq: Four times a day (QID) | ORAL | Status: DC | PRN

## 2020-06-21 MED ORDER — SODIUM CHLORIDE 0.9 % IV SOLN
Freq: Once | INTRAVENOUS | Status: AC
  Administered 2020-06-22: 18 mg via INTRAVENOUS
  Filled 2020-06-21: qty 4

## 2020-06-21 MED ORDER — VINCRISTINE SULFATE CHEMO INJECTION 1 MG/ML
Freq: Once | INTRAVENOUS | Status: AC
  Filled 2020-06-21: qty 8

## 2020-06-21 MED ORDER — ONDANSETRON HCL 8 MG PO TABS: 8.0000 mg | ORAL_TABLET | Freq: Three times a day (TID) | ORAL | Status: DC | PRN

## 2020-06-21 MED ORDER — B COMPLEX-C PO TABS
1.0000 | ORAL_TABLET | Freq: Every day | ORAL | Status: DC
  Administered 2020-06-21 – 2020-06-23 (×3): 1 via ORAL
  Filled 2020-06-21 (×3): qty 1

## 2020-06-21 MED ORDER — SENNOSIDES-DOCUSATE SODIUM 8.6-50 MG PO TABS
2.0000 | ORAL_TABLET | Freq: Every day | ORAL | Status: DC
  Administered 2020-06-21 – 2020-06-23 (×3): 2 via ORAL
  Filled 2020-06-21 (×3): qty 2

## 2020-06-21 MED ORDER — IOHEXOL 300 MG/ML  SOLN
10.0000 mL | Freq: Once | INTRAMUSCULAR | Status: AC | PRN
  Administered 2020-06-21: 10 mL via INTRATHECAL

## 2020-06-21 MED ORDER — POTASSIUM CHLORIDE CRYS ER 20 MEQ PO TBCR
20.0000 meq | EXTENDED_RELEASE_TABLET | Freq: Every day | ORAL | Status: DC
  Administered 2020-06-21 – 2020-06-24 (×4): 20 meq via ORAL
  Filled 2020-06-21 (×4): qty 1

## 2020-06-21 MED ORDER — PROCHLORPERAZINE MALEATE 10 MG PO TABS: 10.0000 mg | ORAL_TABLET | Freq: Four times a day (QID) | ORAL | Status: DC | PRN

## 2020-06-21 MED ORDER — ENSURE ENLIVE PO LIQD
237.0000 mL | Freq: Two times a day (BID) | ORAL | Status: DC
  Administered 2020-06-21 – 2020-06-24 (×5): 237 mL via ORAL

## 2020-06-21 MED ORDER — ADULT MULTIVITAMIN W/MINERALS CH
1.0000 | ORAL_TABLET | Freq: Every day | ORAL | Status: DC
  Administered 2020-06-21 – 2020-06-23 (×2): 1 via ORAL
  Filled 2020-06-21 (×2): qty 1

## 2020-06-21 MED ORDER — HYDROCODONE-ACETAMINOPHEN 5-325 MG PO TABS: 1.0000 | ORAL_TABLET | Freq: Four times a day (QID) | ORAL | Status: DC | PRN

## 2020-06-21 MED ORDER — ACETAMINOPHEN 325 MG PO TABS
650.0000 mg | ORAL_TABLET | Freq: Once | ORAL | Status: AC
  Administered 2020-06-21: 650 mg via ORAL
  Filled 2020-06-21: qty 2

## 2020-06-21 MED ORDER — SODIUM CHLORIDE (PF) 0.9 % IJ SOLN
Freq: Once | INTRAMUSCULAR | Status: AC
  Filled 2020-06-21: qty 0.48

## 2020-06-21 MED ORDER — SODIUM BICARBONATE/SODIUM CHLORIDE MOUTHWASH
1.0000 "application " | Freq: Four times a day (QID) | OROMUCOSAL | Status: DC
  Administered 2020-06-21 – 2020-06-24 (×13): 1 via OROMUCOSAL
  Filled 2020-06-21: qty 1000

## 2020-06-21 MED ORDER — LIDOCAINE-PRILOCAINE 2.5-2.5 % EX CREA: 1.0000 "application " | TOPICAL_CREAM | CUTANEOUS | Status: DC | PRN

## 2020-06-21 MED ORDER — SODIUM CHLORIDE 0.9 % IV SOLN: Freq: Once | INTRAVENOUS | Status: DC

## 2020-06-21 MED ORDER — MORPHINE SULFATE (PF) 2 MG/ML IV SOLN
2.0000 mg | INTRAVENOUS | Status: DC | PRN
  Administered 2020-06-21: 2 mg via INTRAVENOUS
  Filled 2020-06-21: qty 1

## 2020-06-21 MED ORDER — VITAMIN D 25 MCG (1000 UNIT) PO TABS
1000.0000 [IU] | ORAL_TABLET | Freq: Every day | ORAL | Status: DC
  Administered 2020-06-21 – 2020-06-23 (×3): 1000 [IU] via ORAL
  Filled 2020-06-21 (×3): qty 1

## 2020-06-21 MED ORDER — POLYETHYLENE GLYCOL 3350 17 G PO PACK
17.0000 g | PACK | Freq: Every day | ORAL | Status: DC
  Administered 2020-06-21 – 2020-06-24 (×4): 17 g via ORAL
  Filled 2020-06-21 (×4): qty 1

## 2020-06-21 MED ORDER — SODIUM CHLORIDE 0.9 % IV SOLN
Freq: Once | INTRAVENOUS | Status: AC
  Administered 2020-06-21: 18 mg via INTRAVENOUS
  Filled 2020-06-21: qty 4

## 2020-06-21 MED ORDER — ACETAMINOPHEN 325 MG PO TABS: 650.0000 mg | ORAL_TABLET | ORAL | Status: DC | PRN

## 2020-06-21 MED ORDER — ACYCLOVIR 400 MG PO TABS
400.0000 mg | ORAL_TABLET | Freq: Two times a day (BID) | ORAL | Status: DC
  Administered 2020-06-21 – 2020-06-24 (×7): 400 mg via ORAL
  Filled 2020-06-21 (×7): qty 1

## 2020-06-21 MED ORDER — BICTEGRAVIR-EMTRICITAB-TENOFOV 50-200-25 MG PO TABS
1.0000 | ORAL_TABLET | Freq: Every day | ORAL | Status: DC
  Administered 2020-06-21 – 2020-06-24 (×4): 1 via ORAL
  Filled 2020-06-21 (×5): qty 1

## 2020-06-21 NOTE — Progress Notes (Addendum)
HEMATOLOGY-ONCOLOGY PROGRESS NOTE  SUBJECTIVE:   Patient is tolerated cycle 4-day 1 of EPOCH-R chemotherapy well.  Labs are stable.  No shortness of breath.  No nausea vomiting or diarrhea. No mouth soreness. Good appetite and p.o. intake.  Scheduled for intrathecal methotrexate later this morning.   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[place])  04/12/2020 Initial Diagnosis   Diffuse large B-cell lymphoma of lymph nodes of multiple regions ([place])   04/18/2020 -  Chemotherapy   The patient had dexamethasone (DECADRON) 4 MG tablet, 8 mg, Oral, 2 times daily with meals, 1 of 1 cycle, Start date: --, End date: -- DOXOrubicin (ADRIAMYCIN) 16 mg, etoposide (VEPESID) 80 mg, vinCRIStine (ONCOVIN) 0.6 mg in sodium chloride 0.9 % 1,000 mL chemo infusion, , Intravenous, Once, 4 of 6 cycles Administration:  (04/18/2020),  (04/19/2020),  (05/10/2020),  (05/11/2020),  (04/20/2020),  (04/21/2020),  (05/12/2020),  (05/13/2020),  (05/30/2020),  (05/31/2020),  (06/01/2020),  (06/02/2020),  (06/20/2020) ondansetron (ZOFRAN) 8 mg, dexamethasone (DECADRON) 10 mg in sodium chloride 0.9 % 50 mL IVPB, , Intravenous,  Once, 4 of 6 cycles Administration: 8 mg (04/18/2020), 18 mg (04/19/2020), 36 mg (04/22/2020), 18 mg (05/10/2020), 15 mg (05/11/2020), 8 mg (05/14/2020), 8 mg (04/20/2020), 18 mg (04/21/2020), 18 mg (05/12/2020), 18 mg (05/13/2020), 18 mg (05/30/2020), 14 mg (05/31/2020), 36 mg (06/03/2020), 68 mg (06/01/2020), 18 mg (06/02/2020), 18 mg (06/20/2020) methotrexate (PF) 12 mg, hydrocortisone sodium succinate (SOLU-CORTEF) 50 mg in sodium chloride (PF) 0.9 % INTRATHECAL chemo injection, , Intrathecal,  Once, 2 of 4 cycles Administration:  (05/31/2020) cyclophosphamide (CYTOXAN) 1,180 mg in sodium chloride 0.9 % 250 mL chemo infusion, 750 mg/m2 = 1,180 mg, Intravenous,  Once, 4 of 6 cycles Administration: 1,180 mg (04/22/2020), 1,180 mg (05/14/2020), 1,180 mg (06/03/2020)  for chemotherapy treatment.     04/25/2020 -  Chemotherapy   The patient had pegfilgrastim-cbqv (UDENYCA) injection 6 mg, 6 mg, Subcutaneous, Once, 3 of 6 cycles Administration: 6 mg (04/25/2020), 6 mg (05/16/2020), 6 mg (06/06/2020) riTUXimab-pvvr (RUXIENCE) 600 mg in sodium chloride 0.9 % 250 mL (1.9355 mg/mL) infusion, 375 mg/m2 = 600 mg, Intravenous,  Once, 1 of 1 cycle Administration: 600 mg (04/25/2020)  for chemotherapy treatment.       REVIEW OF SYSTEMS:   10 Point review of Systems was done is negative except as noted above.   I have reviewed the past medical history, past surgical history, social history and family history with the patient and they are unchanged from previous note.   PHYSICAL EXAMINATION: ECOG PERFORMANCE STATUS: 1 - Symptomatic but completely ambulatory  Vitals:   06/20/20 2123 06/21/20 0552  BP: 129/82 121/73  Pulse: 89 71  Resp: 16 15  Temp: 97.7 F (36.5 C) (!) 97.5 F (36.4 C)  SpO2: 99% 99%   Filed Weights   06/20/20 1138  Weight: 59.7 kg    Intake/Output from previous day: 08/16 0701 - 08/17 0700 In: 214.4 [I.V.:74.4; IV Piggyback:140] Out: -   GENERAL:alert, in no acute distress and comfortable SKIN: no acute rashes, no significant lesions EYES: conjunctiva are pink and non-injected, sclera anicteric OROPHARYNX: MMM, no exudates, no oropharyngeal erythema or ulceration NECK: supple, no JVD LYMPH:  no palpable lymphadenopathy in the cervical, axillary or inguinal regions LUNGS: decreased air entry left lung base. HEART: regular rate & rhythm ABDOMEN:  normoactive bowel sounds , non tender, not distended. Extremity: no pedal edema PSYCH: alert & oriented x 3 with fluent speech NEURO: no focal motor/sensory  deficits   LABORATORY DATA:  I have reviewed the data as listed CMP Latest Ref Rng & Units 06/21/2020 06/20/2020 06/13/2020  Glucose 70 - 99 mg/dL 175(H) 166(H) 134(H)  BUN 6 - 20 mg/dL 14 14 7   Creatinine 0.44 - 1.00 mg/dL 0.53 0.65 0.86  Sodium 135 - 145  mmol/L 137 142 142  Potassium 3.5 - 5.1 mmol/L 4.0 3.8 3.7  Chloride 98 - 111 mmol/L 105 103 105  CO2 22 - 32 mmol/L 24 26 27   Calcium 8.9 - 10.3 mg/dL 9.4 9.8 10.3  Total Protein 6.5 - 8.1 g/dL 6.5 6.7 6.5  Total Bilirubin 0.3 - 1.2 mg/dL 0.2(L) 0.5 0.3  Alkaline Phos 38 - 126 U/L 76 77 99  AST 15 - 41 U/L 17 20 20   ALT 0 - 44 U/L 22 21 22     Lab Results  Component Value Date   WBC 13.7 (H) 06/21/2020   HGB 10.3 (L) 06/21/2020   HCT 33.2 (L) 06/21/2020   MCV 84.1 06/21/2020   PLT 252 06/21/2020   NEUTROABS 12.5 (H) 06/21/2020    NM PET Image Restag (PS) Skull Base To Thigh  Result Date: 06/11/2020 CLINICAL DATA:  Subsequent treatment strategy for diffuse large B-cell lymphoma. EXAM: NUCLEAR MEDICINE PET SKULL BASE TO THIGH TECHNIQUE: 6.4 mCi F-18 FDG was injected intravenously. Full-ring PET imaging was performed from the skull base to thigh after the radiotracer. CT data was obtained and used for attenuation correction and anatomic localization. Fasting blood glucose: 101 mg/dl COMPARISON:  None. FINDINGS: Mediastinal blood pool activity: SUV max 1.9 Liver activity: SUV max 2.7 NECK: No hypermetabolic lymph nodes in the neck. Incidental CT findings: none CHEST: No hypermetabolic mediastinal or hilar nodes. No suspicious pulmonary nodules on the CT scan. Incidental CT findings: none ABDOMEN/PELVIS: Marked reduction in volume and metabolic activity of splenic mass. Mass currently measures 6.0 by 6.4 cm decreased from 13.3 by 10.0 cm. Metabolic activity is greatly reduced with mild heterogeneous metabolic activity within the mass with SUV max equal 4.5 decreased from SUV max equal 15.7. No hypermetabolic abdominopelvic lymph nodes. Incidental CT findings: Fibroid uterus. Abdominal wall skin lesions again noted. SKELETON: There is diffuse intense marrow activity consistent with GCSF type response. Incidental CT findings: none IMPRESSION: 1. Marked reduction in size and metabolic activity of  splenic mass. Residual mass has mild metabolic activity slightly greater than liver activity ( Deauville 4). 2. No new metastatic disease. 3. Diffuse marrow activity related to GCSF type response. Electronically Signed   By: Suzy Bouchard M.D.   On: 06/11/2020 10:37   DG FLUORO GUIDE LUMBAR PUNCTURE  Result Date: 05/31/2020 CLINICAL DATA:  Large B-cell lymphoma. EXAM: DIAGNOSTIC LUMBAR PUNCTURE UNDER FLUOROSCOPIC GUIDANCE WITH CHEMOTHERAPY INJECTION FLUOROSCOPY TIME:  Fluoroscopy Time:  48 seconds Radiation Exposure Index (if provided by the fluoroscopic device): 14.6 mGy Number of Acquired Spot Images: 0 PROCEDURE: Informed consent was obtained from the patient prior to the procedure, including potential complications of headache, allergy, and pain. With the patient prone, the lower back was prepped with Betadine. 1% Lidocaine was used for local anesthesia. Lumbar puncture was performed at the L3-4 level using a 20 gauge needle with return of clear CSF. Eight ml of CSF were obtained for laboratory studies. Subsequently, 12 mg of methotrexate and 50 mg of hydrocortisone were slowly injected into the thecal sac. The patient described pain with injection, but CSF flow was good both prior and subsequent to methotrexate injection. IMPRESSION: Lumbar puncture for intrathecal chemotherapy  as detailed above. Electronically Signed   By: Abigail Miyamoto M.D.   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 Hemoglobin C], childhood malaria. Patient notes that she had a CT of the abdomen sometime in 2020 that showed no acute pathology.This was not [facility]. Not accessible to [place] at this point. She did have an MRI of the  abdomen in July 2019 which showed indeterminate splenic lesions.No other acute abdominal pathology.No concern with hepatocellular carcinoma.  Patient is presenting now with  #1Pancreatic massalong with retroperitoneal lymphadenopathy and diffuse splenic lesions. No internal necrosis within the mass noted. Diffuse splenomegaly. Left periaortic lymph node causing mild displacement of the third portion of the duodenum.  Significantly elevated LDH levels. 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  # 2 Left sided large pleural effusion and lung atelectasis. S/p diagnostic and therapeutic thoracentesis -- lymphocytic predominance @ 80%  HIV could be risk factors for high grade EBV driven lymphomas  Patient symptomatology has developed rather quickly over the last few weeks.  #75microcytic anemia-chronic.Some element of iron deficiency versus hemoglobinopathy versus anemia of chronic disease related to malignancy. Patient has received 1 dose of IV Feraheme on 04/08/2020 and repeated on 04/22/2020  #4history of HIV/AIDS follows with Dr. Melynda Keller and at [facility].   PLAN: -Labs are stable and patient has no symptoms of prohibitive toxicities from her chemotherapy and is stable to get cycle 4-day 2 of EPOCH-R -Continue as needed antiemetics. -Continue MiraLAX daily -Will need to be cautious with rituximab to prevent recurrent hepatitis B flare.  Repeat HBV DNA undetectable on 06/02/2020. -continue Biktarvy for HIV and Hep B control -She will have intrathecal methotrexate for CNS prophylaxis today (dose 2 of 4).  She developed significant transient body aches during her last intrathecal methotrexate.  We have increased her dexamethasone premed and have ordered morphine prior to the procedure.   The patient is scheduled at the cath center on 06/28/2020 for rituximab and Neulasta and  will return for labs and a follow-up visit on 07/05/2020.  Mikey Bussing, DNP, AGPCNP-BC, AOCNP Mon/Tues/Thurs/Fri 7am-5pm; Off Wednesdays Cell: [phone]   ADDENDUM  .Patient was Personally and independently interviewed, examined and relevant elements of the history of present illness were reviewed in details and an assessment and plan was created. All elements of the patient's history of present illness , assessment and plan were discussed in details with Mikey Bussing, DNP, AGPCNP-BC, AOCNP. The above documentation reflects our combined findings assessment and plan.  Sullivan Lone MD MS

## 2020-06-21 NOTE — Progress Notes (Signed)
Chemotherapy dosages and calculations verified.

## 2020-06-22 LAB — COMPREHENSIVE METABOLIC PANEL
ALT: 18 U/L (ref 0–44)
AST: 16 U/L (ref 15–41)
Albumin: 3.7 g/dL (ref 3.5–5.0)
Alkaline Phosphatase: 76 U/L (ref 38–126)
Anion gap: 10 (ref 5–15)
BUN: 13 mg/dL (ref 6–20)
CO2: 26 mmol/L (ref 22–32)
Calcium: 9.7 mg/dL (ref 8.9–10.3)
Chloride: 103 mmol/L (ref 98–111)
Creatinine, Ser: 0.6 mg/dL (ref 0.44–1.00)
GFR calc Af Amer: >60 mL/min (ref >60)
GFR calc non Af Amer: >60 mL/min (ref >60)
Glucose, Bld: 175 mg/dL — ABNORMAL HIGH (ref 70–99)
Potassium: 3.9 mmol/L (ref 3.5–5.1)
Sodium: 139 mmol/L (ref 135–145)
Total Bilirubin: 0.5 mg/dL (ref 0.3–1.2)
Total Protein: 6.6 g/dL (ref 6.5–8.1)

## 2020-06-22 LAB — CBC WITH DIFFERENTIAL/PLATELET
Abs Immature Granulocytes: 0.23 10*3/uL — ABNORMAL HIGH (ref 0.00–0.07)
Basophils Absolute: 0 10*3/uL (ref 0.0–0.1)
Basophils Relative: 0 %
Eosinophils Absolute: 0 10*3/uL (ref 0.0–0.5)
Eosinophils Relative: 0 %
HCT: 32.4 % — ABNORMAL LOW (ref 36.0–46.0)
Hemoglobin: 10.4 g/dL — ABNORMAL LOW (ref 12.0–15.0)
Immature Granulocytes: 2 %
Lymphocytes Relative: 2 %
Lymphs Abs: 0.3 10*3/uL — ABNORMAL LOW (ref 0.7–4.0)
MCH: 26.9 pg (ref 26.0–34.0)
MCHC: 32.1 g/dL (ref 30.0–36.0)
MCV: 83.7 fL (ref 80.0–100.0)
Monocytes Absolute: 0.4 10*3/uL (ref 0.1–1.0)
Monocytes Relative: 3 %
Neutro Abs: 12 10*3/uL — ABNORMAL HIGH (ref 1.7–7.7)
Neutrophils Relative %: 93 %
Platelets: 261 10*3/uL (ref 150–400)
RBC: 3.87 MIL/uL (ref 3.87–5.11)
WBC: 12.9 10*3/uL — ABNORMAL HIGH (ref 4.0–10.5)
nRBC: 0.2 % (ref 0.0–0.2)

## 2020-06-22 MED ORDER — ENOXAPARIN SODIUM 40 MG/0.4ML ~~LOC~~ SOLN
40.0000 mg | SUBCUTANEOUS | Status: DC
  Administered 2020-06-22 – 2020-06-23 (×2): 40 mg via SUBCUTANEOUS
  Filled 2020-06-22 (×2): qty 0.4

## 2020-06-22 NOTE — Progress Notes (Signed)
HEMATOLOGY-ONCOLOGY PROGRESS NOTE  SUBJECTIVE:   Patient is tolerated cycle 4-day 2 of EPOCH-R chemotherapy well.  Labs are stable.  No shortness of breath.  No nausea vomiting or diarrhea. No mouth soreness. Good appetite and p.o. intake.  Patient tolerated her second dose of intrathecal methotrexate yesterday much better than her first.  No body aches no headaches no back pain. She is in good spirits.  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 She notes some constipation but has been taking MiraLAX and senna. No other acute new symptoms.   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[place])   04/18/2020 -  Chemotherapy   The patient had dexamethasone (DECADRON) 4 MG tablet, 8 mg, Oral, 2 times daily with meals, 1 of 1 cycle, Start date: --, End date: -- DOXOrubicin (ADRIAMYCIN) 16 mg, etoposide (VEPESID) 80 mg, vinCRIStine (ONCOVIN) 0.6 mg in sodium chloride 0.9 % 1,000 mL chemo infusion, , Intravenous, Once, 4 of 6 cycles Administration:  (04/18/2020),  (04/19/2020),  (05/10/2020),  (05/11/2020),  (04/20/2020),  (04/21/2020),  (05/12/2020),  (05/13/2020),  (05/30/2020),  (05/31/2020),  (06/01/2020),  (06/02/2020),  (06/20/2020),  (06/21/2020) ondansetron (ZOFRAN) 8 mg, dexamethasone (DECADRON) 10 mg in sodium chloride 0.9 % 50 mL IVPB, , Intravenous,  Once, 4 of 6 cycles Administration: 8 mg (04/18/2020), 18 mg (04/19/2020), 36 mg (04/22/2020), 18 mg (05/10/2020), 15 mg (05/11/2020), 8 mg (05/14/2020), 8 mg (04/20/2020), 18 mg (04/21/2020), 18 mg (05/12/2020), 18 mg (05/13/2020), 18 mg (05/30/2020), 14 mg (05/31/2020), 36 mg (06/03/2020), 68 mg (06/01/2020), 18 mg (06/02/2020), 18 mg (06/20/2020), 18 mg (06/21/2020) methotrexate (PF) 12 mg, hydrocortisone sodium succinate (SOLU-CORTEF) 50 mg in sodium chloride (PF) 0.9 % INTRATHECAL chemo injection,  , Intrathecal,  Once, 2 of 4 cycles Administration:  (05/31/2020),  (06/21/2020) cyclophosphamide (CYTOXAN) 1,180 mg in sodium chloride 0.9 % 250 mL chemo infusion, 750 mg/m2 = 1,180 mg, Intravenous,  Once, 4 of 6 cycles Administration: 1,180 mg (04/22/2020), 1,180 mg (05/14/2020), 1,180 mg (06/03/2020)  for chemotherapy treatment.    04/25/2020 -  Chemotherapy   The patient had pegfilgrastim-cbqv (UDENYCA) injection 6 mg, 6 mg, Subcutaneous, Once, 3 of 6 cycles Administration: 6 mg (04/25/2020), 6 mg (05/16/2020), 6 mg (06/06/2020) riTUXimab-pvvr (RUXIENCE) 600 mg in sodium chloride 0.9 % 250 mL (1.9355 mg/mL) infusion, 375 mg/m2 = 600 mg, Intravenous,  Once, 1 of 1 cycle Administration: 600 mg (04/25/2020)  for chemotherapy treatment.       REVIEW OF SYSTEMS:   .10 Point review of Systems was done is negative except as noted above.   PHYSICAL EXAMINATION: ECOG PERFORMANCE STATUS: 1 - Symptomatic but completely ambulatory  Vitals:   06/21/20 2019 06/22/20 0533  BP: 119/68 138/83  Pulse: 71 (!) 58  Resp: 18 18  Temp: 98.3 F (36.8 C) 97.8 F (36.6 C)  SpO2: 100% 100%   Filed Weights   06/20/20 1138  Weight: 131 lb 9.5 oz (59.7 kg)    Intake/Output from previous day: 08/17 0701 - 08/18 0700 In: 1845.7 [P.O.:360; I.V.:424.6; IV Piggyback:1061.1] Out: -  VS reviewed.  GENERAL:alert, in no acute distress and comfortable SKIN: no acute rashes, no significant lesions EYES: conjunctiva are pink and non-injected, sclera anicteric OROPHARYNX: MMM, no exudates, no oropharyngeal erythema or ulceration NECK: supple, no JVD LYMPH:  no palpable lymphadenopathy in the cervical, axillary or  inguinal regions LUNGS: clear to auscultation b/l with normal respiratory effort HEART: regular rate & rhythm ABDOMEN:  normoactive bowel sounds , non tender, not distended. Extremity: no pedal edema PSYCH: alert & oriented x 3 with fluent speech NEURO: no focal motor/sensory deficits    LABORATORY  DATA:  I have reviewed the data as listed CMP Latest Ref Rng & Units 06/22/2020 06/21/2020  Glucose 70 - 99 mg/dL 175(H) 175(H)  BUN 6 - 20 mg/dL 13 14  Creatinine 0.44 - 1.00 mg/dL 0.60 0.53  Sodium 135 - 145 mmol/L 139 137  Potassium 3.5 - 5.1 mmol/L 3.9 4.0  Chloride 98 - 111 mmol/L 103 105  CO2 22 - 32 mmol/L 26 24  Calcium 8.9 - 10.3 mg/dL 9.7 9.4  Total Protein 6.5 - 8.1 g/dL 6.6 6.5  Total Bilirubin 0.3 - 1.2 mg/dL 0.5 0.2(L)  Alkaline Phos 38 - 126 U/L 76 76  AST 15 - 41 U/L 16 17  ALT 0 - 44 U/L 18 22    Lab Results  Component Value Date   WBC 12.9 (H) 06/22/2020   HGB 10.4 (L) 06/22/2020   HCT 32.4 (L) 06/22/2020   MCV 83.7 06/22/2020   PLT 261 06/22/2020   NEUTROABS 12.0 (H) 06/22/2020    NM PET Image Restag (PS) Skull Base To Thigh  Result Date: 06/11/2020 CLINICAL DATA:  Subsequent treatment strategy for diffuse large B-cell lymphoma. EXAM: NUCLEAR MEDICINE PET SKULL BASE TO THIGH TECHNIQUE: 6.4 mCi F-18 FDG was injected intravenously. Full-ring PET imaging was performed from the skull base to thigh after the radiotracer. CT data was obtained and used for attenuation correction and anatomic localization. Fasting blood glucose: 101 mg/dl COMPARISON:  None. FINDINGS: Mediastinal blood pool activity: SUV max 1.9 Liver activity: SUV max 2.7 NECK: No hypermetabolic lymph nodes in the neck. Incidental CT findings: none CHEST: No hypermetabolic mediastinal or hilar nodes. No suspicious pulmonary nodules on the CT scan. Incidental CT findings: none ABDOMEN/PELVIS: Marked reduction in volume and metabolic activity of splenic mass. Mass currently measures 6.0 by 6.4 cm decreased from 13.3 by 10.0 cm. Metabolic activity is greatly reduced with mild heterogeneous metabolic activity within the mass with SUV max equal 4.5 decreased from SUV max equal 15.7. No hypermetabolic abdominopelvic lymph nodes. Incidental CT findings: Fibroid uterus. Abdominal wall skin lesions again noted. SKELETON:  There is diffuse intense marrow activity consistent with GCSF type response. Incidental CT findings: none IMPRESSION: 1. Marked reduction in size and metabolic activity of splenic mass. Residual mass has mild metabolic activity slightly greater than liver activity ( Deauville 4). 2. No new metastatic disease. 3. Diffuse marrow activity related to GCSF type response. Electronically Signed   By: Suzy Bouchard M.D.   On: 06/11/2020 10:37   DG FLUORO GUIDE LUMBAR PUNCTURE  Result Date: 06/21/2020 CLINICAL DATA:  Diffuse large B-cell lymphoma. EXAM: FLUOROSCOPICALLY GUIDED LUMBAR PUNCTURE FOR INTRATHECAL CHEMOTHERAPY FLUOROSCOPY TIME:  1 minutes 0 seconds PROCEDURE: Informed consent was obtained from the patient prior to the procedure, including potential complications of headache, allergy, and pain. Safety time-out was performed. With the patient prone, the lower back was prepped with Betadine. 1% Lidocaine was used for local anesthesia. Lumbar puncture was performed at the L3-4 level using a 20 gauge needle with return of bloody CSF. 5 mL of Omnipaque 180 contrast was injected to confirm intrathecal position of the needle. 5 mL of methotrexate chemotherapy obtained from the hospital pharmacy was injected into the subarachnoid space. The needle was subsequently  removed, and a sterile bandage was applied at the skin puncture site. The patient tolerated the procedure well without apparent complication. IMPRESSION: Successful lumbar puncture and intrathecal chemotherapy administration under fluoroscopic guidance. No evidence of immediate complication. Electronically Signed   By: Marlaine Hind M.D.   On: 06/21/2020 13:15   DG FLUORO GUIDE LUMBAR PUNCTURE  Result Date: 05/31/2020 CLINICAL DATA:  Large B-cell lymphoma. EXAM: DIAGNOSTIC LUMBAR PUNCTURE UNDER FLUOROSCOPIC GUIDANCE WITH CHEMOTHERAPY INJECTION FLUOROSCOPY TIME:  Fluoroscopy Time:  48 seconds Radiation Exposure Index (if provided by the fluoroscopic  device): 14.6 mGy Number of Acquired Spot Images: 0 PROCEDURE: Informed consent was obtained from the patient prior to the procedure, including potential complications of headache, allergy, and pain. With the patient prone, the lower back was prepped with Betadine. 1% Lidocaine was used for local anesthesia. Lumbar puncture was performed at the L3-4 level using a 20 gauge needle with return of clear CSF. Eight ml of CSF were obtained for laboratory studies. Subsequently, 12 mg of methotrexate and 50 mg of hydrocortisone were slowly injected into the thecal sac. The patient described pain with injection, but CSF flow was good both prior and subsequent to methotrexate injection. IMPRESSION: Lumbar puncture for intrathecal chemotherapy as detailed above. Electronically Signed   By: Abigail Miyamoto M.D.   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 Hemoglobin C], childhood malaria. Patient notes that she had a CT of the abdomen sometime in 2020 that showed no acute pathology.This was not [facility]. Not accessible to [place] at this point. She did have an MRI of the abdomen in July 2019 which showed indeterminate splenic lesions.No other acute abdominal pathology.No concern with hepatocellular carcinoma.  Patient is presenting now with  #1Pancreatic massalong with retroperitoneal lymphadenopathy and diffuse splenic lesions. No internal necrosis within the mass noted. Diffuse splenomegaly. Left periaortic lymph node causing mild displacement of the third portion of the duodenum.  Significantly elevated LDH levels. CA 19-9 and CEA levels unrevealing  Pancreatic mass biopsy consistent with diffuse large B-cell  lymphoma.  # 2 Left sided large pleural effusion and lung atelectasis. S/p diagnostic and therapeutic thoracentesis -- lymphocytic predominance @ 80%  HIV could be risk factors for high grade EBV driven lymphomas  Patient symptomatology has developed rather quickly over the last few weeks.  #64microcytic anemia-chronic.Some element of iron deficiency versus hemoglobinopathy versus anemia of chronic disease related to malignancy. Patient has received 1 dose of IV Feraheme on 04/08/2020 and repeated on 04/22/2020  #4history of HIV/AIDS follows with Dr. Melynda Keller and at [facility].  #5 H/o Hep B- Repeat HBV DNA undetectable on 06/02/2020. PLAN: -Labs are stable and patient has no symptoms of prohibitive toxicities from her chemotherapy and is stable to get cycle 4-day 3 of EPOCH-R -Continue as needed antiemetics. -Continue MiraLAX daily and Senna-S -continue Biktarvy for HIV and Hep B control -tolerated intrathecal methotrexate for CNS prophylaxis (dose 2 of 4).  No prohibitive toxicities. -restarted on DVT prophylaxis with lovenox -The patient is scheduled at the cancer center on 06/28/2020 for rituximab and Neulasta and will return for labs and a follow-up visit on 07/05/2020.  Sullivan Lone MD MS

## 2020-06-23 LAB — CBC WITH DIFFERENTIAL/PLATELET
Abs Immature Granulocytes: 0.05 10*3/uL (ref 0.00–0.07)
Basophils Absolute: 0 10*3/uL (ref 0.0–0.1)
Basophils Relative: 0 %
Eosinophils Absolute: 0 10*3/uL (ref 0.0–0.5)
Eosinophils Relative: 0 %
HCT: 32.6 % — ABNORMAL LOW (ref 36.0–46.0)
Hemoglobin: 10.4 g/dL — ABNORMAL LOW (ref 12.0–15.0)
Immature Granulocytes: 1 %
Lymphocytes Relative: 4 %
Lymphs Abs: 0.2 10*3/uL — ABNORMAL LOW (ref 0.7–4.0)
MCH: 26.5 pg (ref 26.0–34.0)
MCHC: 31.9 g/dL (ref 30.0–36.0)
MCV: 83 fL (ref 80.0–100.0)
Monocytes Absolute: 0.3 10*3/uL (ref 0.1–1.0)
Monocytes Relative: 5 %
Neutro Abs: 5.2 10*3/uL (ref 1.7–7.7)
Neutrophils Relative %: 90 %
Platelets: 265 10*3/uL (ref 150–400)
RBC: 3.93 MIL/uL (ref 3.87–5.11)
WBC: 5.8 10*3/uL (ref 4.0–10.5)
nRBC: 0.7 % — ABNORMAL HIGH (ref 0.0–0.2)

## 2020-06-23 LAB — COMPREHENSIVE METABOLIC PANEL
ALT: 19 U/L (ref 0–44)
AST: 17 U/L (ref 15–41)
Albumin: 3.7 g/dL (ref 3.5–5.0)
Alkaline Phosphatase: 66 U/L (ref 38–126)
Anion gap: 7 (ref 5–15)
BUN: 14 mg/dL (ref 6–20)
CO2: 29 mmol/L (ref 22–32)
Calcium: 9.9 mg/dL (ref 8.9–10.3)
Chloride: 103 mmol/L (ref 98–111)
Creatinine, Ser: 0.62 mg/dL (ref 0.44–1.00)
GFR calc Af Amer: >60 mL/min (ref >60)
GFR calc non Af Amer: >60 mL/min (ref >60)
Glucose, Bld: 159 mg/dL — ABNORMAL HIGH (ref 70–99)
Potassium: 3.7 mmol/L (ref 3.5–5.1)
Sodium: 139 mmol/L (ref 135–145)
Total Bilirubin: 0.5 mg/dL (ref 0.3–1.2)
Total Protein: 6.7 g/dL (ref 6.5–8.1)

## 2020-06-23 MED ORDER — VINCRISTINE SULFATE CHEMO INJECTION 1 MG/ML
Freq: Once | INTRAVENOUS | Status: AC
  Filled 2020-06-23: qty 8

## 2020-06-23 MED ORDER — SODIUM CHLORIDE 0.9 % IV SOLN
Freq: Once | INTRAVENOUS | Status: AC
  Administered 2020-06-23: 8 mg via INTRAVENOUS
  Filled 2020-06-23: qty 4

## 2020-06-23 NOTE — Progress Notes (Addendum)
HEMATOLOGY-ONCOLOGY PROGRESS NOTE  SUBJECTIVE:   Patient has tolerated cycle 4-day 3 of EPOCH-R chemotherapy well.  Labs are stable.  No shortness of breath.  No nausea vomiting or diarrhea. No mouth soreness. Good appetite and p.o. intake.     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[place])   04/18/2020 -  Chemotherapy   The patient had dexamethasone (DECADRON) 4 MG tablet, 8 mg, Oral, 2 times daily with meals, 1 of 1 cycle, Start date: --, End date: -- DOXOrubicin (ADRIAMYCIN) 16 mg, etoposide (VEPESID) 80 mg, vinCRIStine (ONCOVIN) 0.6 mg in sodium chloride 0.9 % 1,000 mL chemo infusion, , Intravenous, Once, 4 of 6 cycles Administration:  (04/18/2020),  (04/19/2020),  (05/10/2020),  (05/11/2020),  (04/20/2020),  (04/21/2020),  (05/12/2020),  (05/13/2020),  (05/30/2020),  (05/31/2020),  (06/01/2020),  (06/02/2020),  (06/20/2020),  (06/21/2020),  (06/22/2020) ondansetron (ZOFRAN) 8 mg, dexamethasone (DECADRON) 10 mg in sodium chloride 0.9 % 50 mL IVPB, , Intravenous,  Once, 4 of 6 cycles Administration: 8 mg (04/18/2020), 18 mg (04/19/2020), 36 mg (04/22/2020), 18 mg (05/10/2020), 15 mg (05/11/2020), 8 mg (05/14/2020), 8 mg (04/20/2020), 18 mg (04/21/2020), 18 mg (05/12/2020), 18 mg (05/13/2020), 18 mg (05/30/2020), 14 mg (05/31/2020), 36 mg (06/03/2020), 68 mg (06/01/2020), 18 mg (06/02/2020), 18 mg (06/20/2020), 18 mg (06/21/2020), 18 mg (06/22/2020) methotrexate (PF) 12 mg, hydrocortisone sodium succinate (SOLU-CORTEF) 50 mg in sodium chloride (PF) 0.9 % INTRATHECAL chemo injection, , Intrathecal,  Once, 2 of 4 cycles Administration:  (05/31/2020),  (06/21/2020) cyclophosphamide (CYTOXAN) 1,180 mg in sodium chloride 0.9 % 250 mL chemo infusion, 750 mg/m2 = 1,180 mg, Intravenous,  Once, 4 of 6 cycles Administration: 1,180 mg (04/22/2020), 1,180 mg (05/14/2020), 1,180 mg (06/03/2020)  for  chemotherapy treatment.    04/25/2020 -  Chemotherapy   The patient had pegfilgrastim-cbqv (UDENYCA) injection 6 mg, 6 mg, Subcutaneous, Once, 3 of 6 cycles Administration: 6 mg (04/25/2020), 6 mg (05/16/2020), 6 mg (06/06/2020) riTUXimab-pvvr (RUXIENCE) 600 mg in sodium chloride 0.9 % 250 mL (1.9355 mg/mL) infusion, 375 mg/m2 = 600 mg, Intravenous,  Once, 1 of 1 cycle Administration: 600 mg (04/25/2020)  for chemotherapy treatment.       REVIEW OF SYSTEMS:   10 Point review of Systems was done is negative except as noted above.   I have reviewed the past medical history, past surgical history, social history and family history with the patient and they are unchanged from previous note.   PHYSICAL EXAMINATION: ECOG PERFORMANCE STATUS: 1 - Symptomatic but completely ambulatory  Vitals:   06/22/20 2045 06/23/20 0528  BP: 114/90 (!) 149/83  Pulse: 80 (!) 58  Resp: 18 20  Temp: 98 F (36.7 C) 98.1 F (36.7 C)  SpO2: 100% 100%   Filed Weights   06/20/20 1138  Weight: 59.7 kg    Intake/Output from previous day: 08/18 0701 - 08/19 0700 In: 1535.2 [P.O.:240; I.V.:645.8; IV Piggyback:649.4] Out: -   GENERAL:alert, in no acute distress and comfortable SKIN: no acute rashes, no significant lesions EYES: conjunctiva are pink and non-injected, sclera anicteric OROPHARYNX: MMM, no exudates, no oropharyngeal erythema or ulceration NECK: supple, no JVD LYMPH:  no palpable lymphadenopathy in the cervical, axillary or inguinal regions LUNGS: Clear to auscultation bilaterally. HEART: regular rate & rhythm ABDOMEN:  normoactive bowel sounds , non tender, not distended. Extremity: no pedal edema PSYCH: alert & oriented x 3 with  fluent speech NEURO: no focal motor/sensory deficits   LABORATORY DATA:  I have reviewed the data as listed CMP Latest Ref Rng & Units 06/23/2020 06/22/2020 06/21/2020  Glucose 70 - 99 mg/dL 159(H) 175(H) 175(H)  BUN 6 - 20 mg/dL 14 13 14   Creatinine 0.44 - 1.00  mg/dL 0.62 0.60 0.53  Sodium 135 - 145 mmol/L 139 139 137  Potassium 3.5 - 5.1 mmol/L 3.7 3.9 4.0  Chloride 98 - 111 mmol/L 103 103 105  CO2 22 - 32 mmol/L 29 26 24   Calcium 8.9 - 10.3 mg/dL 9.9 9.7 9.4  Total Protein 6.5 - 8.1 g/dL 6.7 6.6 6.5  Total Bilirubin 0.3 - 1.2 mg/dL 0.5 0.5 0.2(L)  Alkaline Phos 38 - 126 U/L 66 76 76  AST 15 - 41 U/L 17 16 17   ALT 0 - 44 U/L 19 18 22     Lab Results  Component Value Date   WBC 5.8 06/23/2020   HGB 10.4 (L) 06/23/2020   HCT 32.6 (L) 06/23/2020   MCV 83.0 06/23/2020   PLT 265 06/23/2020   NEUTROABS 5.2 06/23/2020    NM PET Image Restag (PS) Skull Base To Thigh  Result Date: 06/11/2020 CLINICAL DATA:  Subsequent treatment strategy for diffuse large B-cell lymphoma. EXAM: NUCLEAR MEDICINE PET SKULL BASE TO THIGH TECHNIQUE: 6.4 mCi F-18 FDG was injected intravenously. Full-ring PET imaging was performed from the skull base to thigh after the radiotracer. CT data was obtained and used for attenuation correction and anatomic localization. Fasting blood glucose: 101 mg/dl COMPARISON:  None. FINDINGS: Mediastinal blood pool activity: SUV max 1.9 Liver activity: SUV max 2.7 NECK: No hypermetabolic lymph nodes in the neck. Incidental CT findings: none CHEST: No hypermetabolic mediastinal or hilar nodes. No suspicious pulmonary nodules on the CT scan. Incidental CT findings: none ABDOMEN/PELVIS: Marked reduction in volume and metabolic activity of splenic mass. Mass currently measures 6.0 by 6.4 cm decreased from 13.3 by 10.0 cm. Metabolic activity is greatly reduced with mild heterogeneous metabolic activity within the mass with SUV max equal 4.5 decreased from SUV max equal 15.7. No hypermetabolic abdominopelvic lymph nodes. Incidental CT findings: Fibroid uterus. Abdominal wall skin lesions again noted. SKELETON: There is diffuse intense marrow activity consistent with GCSF type response. Incidental CT findings: none IMPRESSION: 1. Marked reduction in size  and metabolic activity of splenic mass. Residual mass has mild metabolic activity slightly greater than liver activity ( Deauville 4). 2. No new metastatic disease. 3. Diffuse marrow activity related to GCSF type response. Electronically Signed   By: Suzy Bouchard M.D.   On: 06/11/2020 10:37   DG FLUORO GUIDE LUMBAR PUNCTURE  Result Date: 06/21/2020 CLINICAL DATA:  Diffuse large B-cell lymphoma. EXAM: FLUOROSCOPICALLY GUIDED LUMBAR PUNCTURE FOR INTRATHECAL CHEMOTHERAPY FLUOROSCOPY TIME:  1 minutes 0 seconds PROCEDURE: Informed consent was obtained from the patient prior to the procedure, including potential complications of headache, allergy, and pain. Safety time-out was performed. With the patient prone, the lower back was prepped with Betadine. 1% Lidocaine was used for local anesthesia. Lumbar puncture was performed at the L3-4 level using a 20 gauge needle with return of bloody CSF. 5 mL of Omnipaque 180 contrast was injected to confirm intrathecal position of the needle. 5 mL of methotrexate chemotherapy obtained from the hospital pharmacy was injected into the subarachnoid space. The needle was subsequently removed, and a sterile bandage was applied at the skin puncture site. The patient tolerated the procedure well without apparent complication. IMPRESSION: Successful lumbar puncture and  intrathecal chemotherapy administration under fluoroscopic guidance. No evidence of immediate complication. Electronically Signed   By: Marlaine Hind M.D.   On: 06/21/2020 13:15   DG FLUORO GUIDE LUMBAR PUNCTURE  Result Date: 05/31/2020 CLINICAL DATA:  Large B-cell lymphoma. EXAM: DIAGNOSTIC LUMBAR PUNCTURE UNDER FLUOROSCOPIC GUIDANCE WITH CHEMOTHERAPY INJECTION FLUOROSCOPY TIME:  Fluoroscopy Time:  48 seconds Radiation Exposure Index (if provided by the fluoroscopic device): 14.6 mGy Number of Acquired Spot Images: 0 PROCEDURE: Informed consent was obtained from the patient prior to the procedure, including  potential complications of headache, allergy, and pain. With the patient prone, the lower back was prepped with Betadine. 1% Lidocaine was used for local anesthesia. Lumbar puncture was performed at the L3-4 level using a 20 gauge needle with return of clear CSF. Eight ml of CSF were obtained for laboratory studies. Subsequently, 12 mg of methotrexate and 50 mg of hydrocortisone were slowly injected into the thecal sac. The patient described pain with injection, but CSF flow was good both prior and subsequent to methotrexate injection. IMPRESSION: Lumbar puncture for intrathecal chemotherapy as detailed above. Electronically Signed   By: Abigail Miyamoto M.D.   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 Hemoglobin C], childhood malaria. Patient notes that she had a CT of the abdomen sometime in 2020 that showed no acute pathology.This was not [facility]. Not accessible to [place] at this point. She did have an MRI of the abdomen in July 2019 which showed indeterminate splenic lesions.No other acute abdominal pathology.No concern with hepatocellular carcinoma.  Patient is presenting now with  #1Large B cell lymphoma presenting as Pancreatic massalong with retroperitoneal lymphadenopathy and diffuse splenic lesions. No internal necrosis within the mass noted. Diffuse splenomegaly. Left periaortic lymph node causing mild displacement of the third portion of the duodenum.  Significantly elevated LDH levels. 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  # 2 Left sided large pleural effusion and lung atelectasis. S/p diagnostic and therapeutic  thoracentesis -- lymphocytic predominance @ 80%  HIV could be risk factors for high grade EBV driven lymphomas  Patient symptomatology has developed rather quickly over the last few weeks.  #21microcytic anemia-chronic.Some element of iron deficiency versus hemoglobinopathy versus anemia of chronic disease related to malignancy. Patient has received 1 dose of IV Feraheme on 04/08/2020 and repeated on 04/22/2020  #4history of HIV/AIDS follows with Dr. Melynda Keller and at [facility].   PLAN: -Labs are stable and patient has no symptoms of prohibitive toxicities from her chemotherapy and is stable to get cycle 4-day 4 of EPOCH-R -Continue as needed antiemetics. -Continue MiraLAX daily -Will need to be cautious with rituximab to prevent recurrent hepatitis B flare.  Repeat HBV DNA undetectable on 06/02/2020. -continue Biktarvy for HIV and Hep B control -She received intrathecal methotrexate for CNS prophylaxis 06/21/2020 (dose 2 of 4).  She developed significant transient body aches during her first dose of intrathecal methotrexate.  She received an increased dose of dexamethasone premed and and IV morphine prior to the procedure and did much better.  We will plan to continue these premedications moving forward.  The patient is scheduled at the cancer center on 06/28/2020 for rituximab and Neulasta and will  return for labs and a follow-up visit on 07/05/2020.  Mikey Bussing, DNP, AGPCNP-BC, AOCNP Mon/Tues/Thurs/Fri 7am-5pm; Off Wednesdays Cell: [phone]    ADDENDUM .Patient was Personally and independently interviewed, examined and relevant elements of the history of present illness were reviewed in details and an assessment and plan was created. All elements of the patient's history of present illness , assessment and plan were discussed in details with Mikey Bussing, DNP, AGPCNP-BC, AOCNP. The above documentation reflects our combined findings  assessment and plan.  Sullivan Lone MD MS

## 2020-06-24 ENCOUNTER — Telehealth: Payer: Self-pay | Admitting: Hematology

## 2020-06-24 LAB — COMPREHENSIVE METABOLIC PANEL
ALT: 38 U/L (ref 0–44)
AST: 28 U/L (ref 15–41)
Albumin: 3.5 g/dL (ref 3.5–5.0)
Alkaline Phosphatase: 60 U/L (ref 38–126)
Anion gap: 11 (ref 5–15)
BUN: 16 mg/dL (ref 6–20)
CO2: 27 mmol/L (ref 22–32)
Calcium: 9.7 mg/dL (ref 8.9–10.3)
Chloride: 101 mmol/L (ref 98–111)
Creatinine, Ser: 0.61 mg/dL (ref 0.44–1.00)
GFR calc Af Amer: >60 mL/min (ref >60)
GFR calc non Af Amer: >60 mL/min (ref >60)
Glucose, Bld: 147 mg/dL — ABNORMAL HIGH (ref 70–99)
Potassium: 3.9 mmol/L (ref 3.5–5.1)
Sodium: 139 mmol/L (ref 135–145)
Total Bilirubin: 0.3 mg/dL (ref 0.3–1.2)
Total Protein: 6 g/dL — ABNORMAL LOW (ref 6.5–8.1)

## 2020-06-24 LAB — CBC WITH DIFFERENTIAL/PLATELET
Abs Immature Granulocytes: 0.03 10*3/uL (ref 0.00–0.07)
Basophils Absolute: 0 10*3/uL (ref 0.0–0.1)
Basophils Relative: 0 %
Eosinophils Absolute: 0 10*3/uL (ref 0.0–0.5)
Eosinophils Relative: 0 %
HCT: 30.7 % — ABNORMAL LOW (ref 36.0–46.0)
Hemoglobin: 10.1 g/dL — ABNORMAL LOW (ref 12.0–15.0)
Immature Granulocytes: 1 %
Lymphocytes Relative: 9 %
Lymphs Abs: 0.2 10*3/uL — ABNORMAL LOW (ref 0.7–4.0)
MCH: 26.9 pg (ref 26.0–34.0)
MCHC: 32.9 g/dL (ref 30.0–36.0)
MCV: 81.9 fL (ref 80.0–100.0)
Monocytes Absolute: 0.1 10*3/uL (ref 0.1–1.0)
Monocytes Relative: 3 %
Neutro Abs: 2.2 10*3/uL (ref 1.7–7.7)
Neutrophils Relative %: 87 %
Platelets: 249 10*3/uL (ref 150–400)
RBC: 3.75 MIL/uL — ABNORMAL LOW (ref 3.87–5.11)
WBC: 2.5 10*3/uL — ABNORMAL LOW (ref 4.0–10.5)
nRBC: 1.6 % — ABNORMAL HIGH (ref 0.0–0.2)

## 2020-06-24 MED ORDER — SODIUM CHLORIDE 0.9 % IV SOLN
750.0000 mg/m2 | Freq: Once | INTRAVENOUS | Status: AC
  Administered 2020-06-24: 1180 mg via INTRAVENOUS
  Filled 2020-06-24: qty 59

## 2020-06-24 MED ORDER — SODIUM CHLORIDE 0.9 % IV SOLN
Freq: Once | INTRAVENOUS | Status: AC
  Administered 2020-06-24: 36 mg via INTRAVENOUS
  Filled 2020-06-24: qty 8

## 2020-06-24 NOTE — Discharge Summary (Addendum)
Physician Discharge Summary  Patient ID: Heather [name] MRN: [id number] DOB/AGE: 02/14/1976 44 y.o.  Admit date: 06/20/2020 Discharge date: 06/24/2020  Discharge Diagnoses:  Active Problems:   Diffuse large B-cell lymphoma of lymph nodes of multiple regions [facility])  Discharged Condition: good  Discharge Labs:    CBC    Component Value Date/Time   WBC 2.5 (L) 06/24/2020 0447   RBC 3.75 (L) 06/24/2020 0447   HGB 10.1 (L) 06/24/2020 0447   HGB 9.9 (L) 04/28/2020 1217   HCT 30.7 (L) 06/24/2020 0447   PLT 249 06/24/2020 0447   PLT 198 04/28/2020 1217   MCV 81.9 06/24/2020 0447   MCH 26.9 06/24/2020 0447   MCHC 32.9 06/24/2020 0447   RDW Not Measured 06/24/2020 0447   LYMPHSABS 0.2 (L) 06/24/2020 0447   MONOABS 0.1 06/24/2020 0447   EOSABS 0.0 06/24/2020 0447   BASOSABS 0.0 06/24/2020 0447   CMP Latest Ref Rng & Units 06/24/2020 06/23/2020 06/22/2020  Glucose 70 - 99 mg/dL 147(H) 159(H) 175(H)  BUN 6 - 20 mg/dL 16 14 13   Creatinine 0.44 - 1.00 mg/dL 0.61 0.62 0.60  Sodium 135 - 145 mmol/L 139 139 139  Potassium 3.5 - 5.1 mmol/L 3.9 3.7 3.9  Chloride 98 - 111 mmol/L 101 103 103  CO2 22 - 32 mmol/L 27 29 26   Calcium 8.9 - 10.3 mg/dL 9.7 9.9 9.7  Total Protein 6.5 - 8.1 g/dL 6.0(L) 6.7 6.6  Total Bilirubin 0.3 - 1.2 mg/dL 0.3 0.5 0.5  Alkaline Phos 38 - 126 U/L 60 66 76  AST 15 - 41 U/L 28 17 16   ALT 0 - 44 U/L 38 19 18    Consults: None  Procedures: Received intrathecal methotrexate in radiology on 06/21/2020  Disposition:  Discharge disposition: 01-Home or Self Care      Allergies as of 06/24/2020   No Known Allergies     Medication List    STOP taking these medications   potassium chloride SA 20 MEQ tablet Commonly known as: KLOR-CON     TAKE these medications   acetaminophen 500 MG tablet Commonly known as: TYLENOL Take 500 mg by mouth every 6 (six) hours as needed for mild pain or headache. What changed: Another medication with the same name was removed.  Continue taking this medication, and follow the directions you see here.   acyclovir 400 MG tablet Commonly known as: ZOVIRAX Take 1 tablet (400 mg total) by mouth 2 (two) times daily.   B COMPLEX PO Take 1 tablet by mouth daily.   Biktarvy 50-200-25 MG Tabs tablet Generic drug: bictegravir-emtricitabine-tenofovir AF Take 1 tablet by mouth daily with breakfast.   cholecalciferol 25 MCG (1000 UNIT) tablet Commonly known as: VITAMIN D3 Take 1 tablet (1,000 Units total) by mouth at bedtime.   feeding supplement (ENSURE ENLIVE) Liqd Take 237 mLs by mouth 2 (two) times daily between meals.   HYDROcodone-acetaminophen 5-325 MG tablet Commonly known as: Norco Take 1 tablet by mouth every 6 (six) hours as needed for moderate pain.   ibuprofen 200 MG tablet Commonly known as: ADVIL Take 2 tablets (400 mg total) by mouth every 6 (six) hours as needed for mild pain.   INSULIN SYRINGE .5CC/31GX5/16" 31G X 5/16" 0.5 ML Misc See admin instructions.   lidocaine-prilocaine cream Commonly known as: EMLA Apply to affected area once What changed:   how much to take  how to take this  when to take this  reasons to take this   LORazepam 0.5  MG tablet Commonly known as: ATIVAN Take 1 tablet (0.5 mg total) by mouth every 6 (six) hours as needed (for chemo induced nausea or vomiting).   multivitamin with minerals Tabs tablet Take 1 tablet by mouth at bedtime.   ondansetron 8 MG tablet Commonly known as: ZOFRAN Take 1 tablet (8 mg total) by mouth every 8 (eight) hours as needed for nausea or vomiting.   polyethylene glycol 17 g packet Commonly known as: MIRALAX / GLYCOLAX Take 17 g by mouth daily as needed for mild constipation. What changed: when to take this   prochlorperazine 10 MG tablet Commonly known as: COMPAZINE Take 1 tablet (10 mg total) by mouth every 6 (six) hours as needed for nausea or vomiting.   senna-docusate 8.6-50 MG tablet Commonly known as: Senokot-S Take  2 tablets by mouth at bedtime as needed for mild constipation.   sodium bicarbonate/sodium chloride Soln 1 application by Mouth Rinse route 4 (four) times daily.       HPI: [name] is a wonderful 44 y.o. female with a past medical history significant for hepatitis B, HIV, chronic anemia who presented to [facility] with abdominal pain. She had a 2-week history of early satiety, poor appetite and, 5 pound weight loss. Abdominal pain is typically postprandial and mainly at night after dinner while laying in bed. Has been associated with nausea improves after vomiting. Labs on admission showed a hemoglobin of 9.3, MCV 67.4, platelet count 554,000. Lipase was mildly elevated at 123. 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  During her initial consult, she was having ongoingpain is primarily in her epigastric area and radiates to the left side of her abdomen. States pain is worse when laying down after she eats. She states that she has had this pain for at least 2 weeks. Her abdominal pain is worse after eating. She has had some nausea and intermittent vomiting. Vomiting makes her pain better. She was attributing her symptoms to some the medications that she was taking for fertility treatments. She reports a poor appetite and about a 5 pound weight loss. She is not having any fevers or chills. Denies night sweats. She has not noticed any palpable lymphadenopathy. The patient underwent a biopsy of her pancreatic mass on 04/08/2020 ([id number]) with  results feeling diffuse large B-cell lymphoma.  It was recommended for her to undergo systemic chemotherapy with EPOCH-R.   On the day of admission, the patient was feeling well.  She reported that her appetite was improving.  Abdominal pain resolved and she was not having any nausea or vomiting.  She had no chest pain or shortness of breath. She was admitted for cycle #4 of EPOCH-R.   Hospital Course:  The patient tolerated her fouth cycle of chemotherapy well overall.  She had no nausea or vomiting this admission.  She was placed on MiraLAX and Senokot-S for constipation which was effective.  She had no abdominal pain, chest pain, shortness of breath.  She developed several small ulcerations in her posterior pharynx on the day of discharge.  She has been routinely using sodium bicarbonate mouth rinses.  She received intrathecal methotrexate for  CNS prophylaxis on 06/21/2020 and tolerated it well and was premedicated with an increased dose of dexamethasone and a dose of morphine prior to the procedure. On the day of discharge [place] has no acute new issues.  The patient will return to the cancer center on 06/27/2020 (scheduling message sent) for Neulasta and 06/28/2020 for Rituxan.  She will have a follow-up office visit with lab work on 07/05/2020.   The patient was instructed to call our office for a fever of 100.5 or higher, uncontrolled nausea or vomiting, or any other concerns.  Exam:  GENERAL:alert, in no acute distress and comfortable SKIN: no acute rashes, no significant lesions EYES: conjunctiva are pink and non-injected, sclera anicteric OROPHARYNX: Small ulcerations in the posterior pharynx NECK: supple, no JVD LYMPH:  no palpable lymphadenopathy in the cervical, axillary or inguinal regions LUNGS: decreased air entry left lung base. HEART: regular rate & rhythm ABDOMEN:  normoactive bowel sounds , non tender, not distended. Extremity: no pedal edema PSYCH: alert & oriented x 3 with  fluent speech NEURO: no focal motor/sensory deficits  Discharge Instructions    Activity as tolerated - No restrictions   Complete by: As directed    Diet general   Complete by: As directed      Signed: Mikey Bussing 06/24/2020, 10:06 AM   ADDENDUM  .Patient was Personally and independently interviewed, examined and relevant elements of the discharge plan were reviewed in details and an assessment and plan was created. All elements of the patient's discharge plan were discussed in details with Mikey Bussing DNP. The above documentation reflects our combined findings assessment and plan.  Sullivan Lone MD MS  TT discharging patient> 30 mins

## 2020-06-24 NOTE — Telephone Encounter (Signed)
Scheduled appt per 8/20 sch msg - spoke with pt and she is aware of appt on 8/23 and 8/24

## 2020-06-24 NOTE — Progress Notes (Signed)
Pt discharged home with spouse in stable condition. Discharge instructions given. Pt verbalized understanding. No immediate questions or concerns at this time. Pt discharged from unit via wheelchair.

## 2020-06-27 ENCOUNTER — Inpatient Hospital Stay: Payer: 59

## 2020-06-27 ENCOUNTER — Other Ambulatory Visit: Payer: Self-pay

## 2020-06-27 VITALS — BP 122/77 | HR 65 | Temp 98.6°F | Resp 16

## 2020-06-27 DIAGNOSIS — Z7189 Other specified counseling: Secondary | ICD-10-CM

## 2020-06-27 DIAGNOSIS — C8338 Diffuse large B-cell lymphoma, lymph nodes of multiple sites: Secondary | ICD-10-CM

## 2020-06-27 DIAGNOSIS — B20 Human immunodeficiency virus [HIV] disease: Secondary | ICD-10-CM | POA: Diagnosis not present

## 2020-06-27 DIAGNOSIS — Z5112 Encounter for antineoplastic immunotherapy: Secondary | ICD-10-CM | POA: Diagnosis present

## 2020-06-27 DIAGNOSIS — Z79899 Other long term (current) drug therapy: Secondary | ICD-10-CM | POA: Diagnosis not present

## 2020-06-27 DIAGNOSIS — D6489 Other specified anemias: Secondary | ICD-10-CM | POA: Diagnosis not present

## 2020-06-27 DIAGNOSIS — C8510 Unspecified B-cell lymphoma, unspecified site: Secondary | ICD-10-CM | POA: Diagnosis not present

## 2020-06-27 DIAGNOSIS — Z5189 Encounter for other specified aftercare: Secondary | ICD-10-CM | POA: Diagnosis not present

## 2020-06-27 MED ORDER — PEGFILGRASTIM-CBQV 6 MG/0.6ML ~~LOC~~ SOSY
6.0000 mg | PREFILLED_SYRINGE | Freq: Once | SUBCUTANEOUS | Status: AC
  Administered 2020-06-27: 6 mg via SUBCUTANEOUS

## 2020-06-27 MED ORDER — PEGFILGRASTIM-CBQV 6 MG/0.6ML ~~LOC~~ SOSY
PREFILLED_SYRINGE | SUBCUTANEOUS | Status: AC
  Filled 2020-06-27: qty 0.6

## 2020-06-27 NOTE — Patient Instructions (Signed)

## 2020-06-28 ENCOUNTER — Inpatient Hospital Stay: Payer: 59

## 2020-06-28 ENCOUNTER — Other Ambulatory Visit: Payer: Self-pay

## 2020-06-28 VITALS — BP 105/71 | HR 83 | Temp 98.5°F | Resp 16

## 2020-06-28 DIAGNOSIS — Z7189 Other specified counseling: Secondary | ICD-10-CM

## 2020-06-28 DIAGNOSIS — C8338 Diffuse large B-cell lymphoma, lymph nodes of multiple sites: Secondary | ICD-10-CM

## 2020-06-28 DIAGNOSIS — Z5112 Encounter for antineoplastic immunotherapy: Secondary | ICD-10-CM | POA: Diagnosis not present

## 2020-06-28 MED ORDER — HYDROXYZINE HCL 25 MG PO TABS
25.0000 mg | ORAL_TABLET | Freq: Once | ORAL | Status: AC
  Administered 2020-06-28: 25 mg via ORAL
  Filled 2020-06-28: qty 1

## 2020-06-28 MED ORDER — SODIUM CHLORIDE 0.9 % IV SOLN
375.0000 mg/m2 | Freq: Once | INTRAVENOUS | Status: AC
  Administered 2020-06-28: 600 mg via INTRAVENOUS
  Filled 2020-06-28: qty 50

## 2020-06-28 MED ORDER — ACETAMINOPHEN 325 MG PO TABS
ORAL_TABLET | ORAL | Status: AC
  Filled 2020-06-28: qty 2

## 2020-06-28 MED ORDER — ACETAMINOPHEN 325 MG PO TABS
650.0000 mg | ORAL_TABLET | Freq: Once | ORAL | Status: AC
  Administered 2020-06-28: 650 mg via ORAL

## 2020-06-28 MED ORDER — SODIUM CHLORIDE 0.9% FLUSH
10.0000 mL | INTRAVENOUS | Status: DC | PRN
  Administered 2020-06-28: 10 mL
  Filled 2020-06-28: qty 10

## 2020-06-28 MED ORDER — SODIUM CHLORIDE 0.9 % IV SOLN
Freq: Once | INTRAVENOUS | Status: AC
  Filled 2020-06-28: qty 250

## 2020-06-28 MED ORDER — HEPARIN SOD (PORK) LOCK FLUSH 100 UNIT/ML IV SOLN
500.0000 [IU] | Freq: Once | INTRAVENOUS | Status: AC | PRN
  Administered 2020-06-28: 500 [IU]
  Filled 2020-06-28: qty 5

## 2020-06-28 NOTE — Patient Instructions (Signed)
[facility] Discharge Instructions for Patients Receiving Chemotherapy   Today you received the following chemotherapy agents Rituximab-pvvr (RUXIENCE).  To help prevent nausea and vomiting after your treatment, we encourage you to take your nausea medication as prescribed.   If you develop nausea and vomiting that is not controlled by your nausea medication, call the clinic.   BELOW ARE SYMPTOMS THAT SHOULD BE REPORTED IMMEDIATELY:  *FEVER GREATER THAN 100.5 F  *CHILLS WITH OR WITHOUT FEVER  NAUSEA AND VOMITING THAT IS NOT CONTROLLED WITH YOUR NAUSEA MEDICATION  *UNUSUAL SHORTNESS OF BREATH  *UNUSUAL BRUISING OR BLEEDING  TENDERNESS IN MOUTH AND THROAT WITH OR WITHOUT PRESENCE OF ULCERS  *URINARY PROBLEMS  *BOWEL PROBLEMS  UNUSUAL RASH Items with * indicate a potential emergency and should be followed up as soon as possible.  Feel free to call the clinic should you have any questions or concerns. The clinic phone number is [phone].  Please show the [facility] at check-in to the Emergency Department and triage nurse.  Pegfilgrastim injection What is this medicine? PEGFILGRASTIM (PEG fil gra stim) is a long-acting granulocyte colony-stimulating factor that stimulates the growth of neutrophils, a type of white blood cell important in the body's fight against infection. It is used to reduce the incidence of fever and infection in patients with certain types of cancer who are receiving chemotherapy that affects the bone marrow, and to increase survival after being exposed to high doses of radiation. This medicine may be used for other purposes; ask your health care provider or pharmacist if you have questions. COMMON BRAND NAME(S): Steve Rattler, Ziextenzo What should I tell my health care provider before I take this medicine? They need to know if you have any of these conditions:  kidney disease  latex allergy  ongoing radiation  therapy  sickle cell disease  skin reactions to acrylic adhesives (On-Body Injector only)  an unusual or allergic reaction to pegfilgrastim, filgrastim, other medicines, foods, dyes, or preservatives  pregnant or trying to get pregnant  breast-feeding How should I use this medicine? This medicine is for injection under the skin. If you get this medicine at home, you will be taught how to prepare and give the pre-filled syringe or how to use the On-body Injector. Refer to the patient Instructions for Use for detailed instructions. Use exactly as directed. Tell your healthcare provider immediately if you suspect that the On-body Injector may not have performed as intended or if you suspect the use of the On-body Injector resulted in a missed or partial dose. It is important that you put your used needles and syringes in a special sharps container. Do not put them in a trash can. If you do not have a sharps container, call your pharmacist or healthcare provider to get one. Talk to your pediatrician regarding the use of this medicine in children. While this drug may be prescribed for selected conditions, precautions do apply. Overdosage: If you think you have taken too much of this medicine contact a poison control center or emergency room at once. NOTE: This medicine is only for you. Do not share this medicine with others. What if I miss a dose? It is important not to miss your dose. Call your doctor or health care professional if you miss your dose. If you miss a dose due to an On-body Injector failure or leakage, a new dose should be administered as soon as possible using a single prefilled syringe for manual use. What  may interact with this medicine? Interactions have not been studied. Give your health care provider a list of all the medicines, herbs, non-prescription drugs, or dietary supplements you use. Also tell them if you smoke, drink alcohol, or use illegal drugs. Some items may interact  with your medicine. This list may not describe all possible interactions. Give your health care provider a list of all the medicines, herbs, non-prescription drugs, or dietary supplements you use. Also tell them if you smoke, drink alcohol, or use illegal drugs. Some items may interact with your medicine. What should I watch for while using this medicine? You may need blood work done while you are taking this medicine. If you are going to need a MRI, CT scan, or other procedure, tell your doctor that you are using this medicine (On-Body Injector only). What side effects may I notice from receiving this medicine? Side effects that you should report to your doctor or health care professional as soon as possible:  allergic reactions like skin rash, itching or hives, swelling of the face, lips, or tongue  back pain  dizziness  fever  pain, redness, or irritation at site where injected  pinpoint red spots on the skin  red or dark-brown urine  shortness of breath or breathing problems  stomach or side pain, or pain at the shoulder  swelling  tiredness  trouble passing urine or change in the amount of urine Side effects that usually do not require medical attention (report to your doctor or health care professional if they continue or are bothersome):  bone pain  muscle pain This list may not describe all possible side effects. Call your doctor for medical advice about side effects. You may report side effects to FDA at 1-800-FDA-1088. Where should I keep my medicine? Keep out of the reach of children. If you are using this medicine at home, you will be instructed on how to store it. Throw away any unused medicine after the expiration date on the label. NOTE: This sheet is a summary. It may not cover all possible information. If you have questions about this medicine, talk to your doctor, pharmacist, or health care provider.  2020 [employer] Standard (2018-01-27 16:57:08)

## 2020-07-05 ENCOUNTER — Inpatient Hospital Stay: Payer: 59

## 2020-07-05 ENCOUNTER — Other Ambulatory Visit: Payer: Self-pay

## 2020-07-05 ENCOUNTER — Inpatient Hospital Stay: Payer: 59 | Admitting: Hematology

## 2020-07-05 VITALS — BP 121/86 | HR 104 | Temp 97.9°F | Resp 18 | Ht 60.0 in | Wt 136.1 lb

## 2020-07-05 DIAGNOSIS — C8338 Diffuse large B-cell lymphoma, lymph nodes of multiple sites: Secondary | ICD-10-CM

## 2020-07-05 DIAGNOSIS — Z5112 Encounter for antineoplastic immunotherapy: Secondary | ICD-10-CM | POA: Diagnosis not present

## 2020-07-05 LAB — CBC WITH DIFFERENTIAL/PLATELET
Abs Immature Granulocytes: 11.94 10*3/uL — ABNORMAL HIGH (ref 0.00–0.07)
Basophils Absolute: 0.1 10*3/uL (ref 0.0–0.1)
Basophils Relative: 0 %
Eosinophils Absolute: 0 10*3/uL (ref 0.0–0.5)
Eosinophils Relative: 0 %
HCT: 33.5 % — ABNORMAL LOW (ref 36.0–46.0)
Hemoglobin: 10.5 g/dL — ABNORMAL LOW (ref 12.0–15.0)
Immature Granulocytes: 29 %
Lymphocytes Relative: 2 %
Lymphs Abs: 0.9 10*3/uL (ref 0.7–4.0)
MCH: 26.8 pg (ref 26.0–34.0)
MCHC: 31.3 g/dL (ref 30.0–36.0)
MCV: 85.5 fL (ref 80.0–100.0)
Monocytes Absolute: 3.4 10*3/uL — ABNORMAL HIGH (ref 0.1–1.0)
Monocytes Relative: 8 %
Neutro Abs: 25.2 10*3/uL — ABNORMAL HIGH (ref 1.7–7.7)
Neutrophils Relative %: 61 %
Platelets: 120 10*3/uL — ABNORMAL LOW (ref 150–400)
RBC: 3.92 MIL/uL (ref 3.87–5.11)
RDW: 27.8 % — ABNORMAL HIGH (ref 11.5–15.5)
WBC: 41.5 10*3/uL — ABNORMAL HIGH (ref 4.0–10.5)
nRBC: 2.6 % — ABNORMAL HIGH (ref 0.0–0.2)

## 2020-07-05 LAB — CMP (CANCER CENTER ONLY)
ALT: 24 U/L (ref 0–44)
AST: 21 U/L (ref 15–41)
Albumin: 3.8 g/dL (ref 3.5–5.0)
Alkaline Phosphatase: 133 U/L — ABNORMAL HIGH (ref 38–126)
Anion gap: 6 (ref 5–15)
BUN: 9 mg/dL (ref 6–20)
CO2: 31 mmol/L (ref 22–32)
Calcium: 10.7 mg/dL — ABNORMAL HIGH (ref 8.9–10.3)
Chloride: 104 mmol/L (ref 98–111)
Creatinine: 0.75 mg/dL (ref 0.44–1.00)
GFR, Est AFR Am: >60 mL/min (ref >60)
GFR, Estimated: >60 mL/min (ref >60)
Glucose, Bld: 131 mg/dL — ABNORMAL HIGH (ref 70–99)
Potassium: 3.8 mmol/L (ref 3.5–5.1)
Sodium: 141 mmol/L (ref 135–145)
Total Bilirubin: 0.3 mg/dL (ref 0.3–1.2)
Total Protein: 6.6 g/dL (ref 6.5–8.1)

## 2020-07-05 NOTE — Progress Notes (Signed)
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 female with a past medical history significant for hepatitis B, HIV, chronic anemia who presented to [facility] with abdominal pain.  She had a 2-week history of early satiety, poor appetite and, 5 pound weight loss.  Abdominal pain is typically postprandial and mainly at night after dinner while laying in bed.  Has been associated with nausea improves after vomiting.  Labs on admission showed a hemoglobin of 9.3, MCV 67.4, platelet count 554,000.  Lipase was mildly elevated at 123.  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  Husband was at the bedside at the time of the visit.  The patient reports that she is having ongoing abdominal discomfort today.  Pain is primarily in her epigastric area and radiates to the left side of her abdomen.  States pain is worse when laying down after she eats.  She states that she has had this pain for at least 2 weeks.  Her abdominal pain is worse after eating.  She has had some nausea and intermittent vomiting.  Vomiting makes her pain better.  She was attributing her symptoms to some the medications that she was taking for fertility treatments.  She reports a poor appetite and about a 5 pound weight loss.  She is not having  any fevers or chills.  Denies night sweats.  She has not noticed any palpable lymphadenopathy.  She denies headaches, dizziness, chest pain, shortness of breath, diarrhea.  She reports intermittent constipation.  Denies bleeding or lower extremity edema.  The patient is married and has no children.  She currently works as an Therapist, sports.  Denies alcohol tobacco use.  Family history significant for paternal grandmother with uterine cancer.  Medical oncology was asked see the patient to make recommendations regarding her abnormal CT scan findings.  INTERVAL HISTORY: Heather [name] is a 44 year old female who is here for evaluation and management of Large B-cell lymphoma.  The patient's last visit with [place] was on 06/13/2020. The pt reports that she is doing well overall.  The pt reports that she wakes frequently during the night in the week following her inpatient stay. Otherwise she has been eating and sleeping well. She occasionally has abdominal pain. Pt continues to stay active by walking. Pt missed her menstrual cycle this month and has been experiencing hot flashes. She has had some mouth sores, but none have been especially bothersome. She experiences numbness in her fingertips in the mornings and tingling in her fingertips throughout most of the day. Pt denies any numbness or tingling in her feet.  Lab results today (07/05/20) of CBC w/diff and CMP is as follows: all values are WNL except for WBC at 41.5K, Hgb at 10.5, HCT at 33.5, RDW at 27.8, PLT at 120K, nRBC at 2.6, Neutro Abs at 25.2K, Mono Abs at 3.4K, Abs Immature Granulocytes at 11.94K, Polychromasia is "Present", Target Cells  are "Present", Glucose at 131, Calcium at 10.7, ALP at 133.  On review of systems, pt reports mouth sores, hot flashes, abdominal pain, numbness/tingling in fingertips and denies fatigue, fevers, chills, night sweats, constipation, leg swelling, SOB and any other symptoms.   MEDICAL HISTORY:  Past Medical History:  Diagnosis  Date  . Anemia   . Diffuse large B-cell lymphoma of lymph nodes of multiple regions ([place]) 04/12/2020  . 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 History of blood transfusion    childhood  . HIV (human immunodeficiency virus infection) ([place])   . Immune deficiency disorder [facility])     SURGICAL HISTORY: Past Surgical History:  Procedure Laterality Date  . CHROMOPERTUBATION Bilateral 11/29/2016   Procedure: CHROMOPERTUBATION;  Surgeon: Waymon Amato, MD;  Location: [facility];  Service: Gynecology;  Laterality: Bilateral;  fallopian tubes  . ENDOMETRIAL BIOPSY    . IR IMAGING GUIDED PORT INSERTION  04/15/2020  . IR THORACENTESIS ASP PLEURAL SPACE W/IMG GUIDE  04/15/2020  . MYOMECTOMY N/A 11/29/2016   Procedure: MYOMECTOMY;  Surgeon: Waymon Amato, MD;  Location: [facility];  Service: Gynecology;  Laterality: N/A;    SOCIAL HISTORY: Social History   Socioeconomic History  . Marital status: Married    Spouse name: Not on file  . Number of children: Not on file  . Years of education: Not on file  . Highest education level: Not on file  Occupational History  . Not on file  Tobacco Use  . Smoking status: Never Smoker  . Smokeless tobacco: Never Used  Substance and Sexual Activity  . Alcohol use: Yes    Comment: occ  . Drug use: No  . Sexual activity: Yes  Other Topics Concern  . Not on file  Social History Narrative  . Not on file   Social Determinants of Health   Financial Resource Strain:   . Difficulty of Paying Living Expenses: Not on file  Food Insecurity:   . Worried About Charity fundraiser in the Last Year: Not on file  . Ran Out of Food in the Last Year: Not on file  Transportation Needs:   . Lack of Transportation (Medical): Not on file  . Lack of Transportation (Non-Medical): Not on file  Physical Activity:   . Days of Exercise per Week: Not on file  . Minutes of Exercise per Session: Not on file  Stress:   . Feeling of Stress : Not on file  Social Connections:   .  Frequency of Communication with Friends and Family: Not on file  . Frequency of Social Gatherings with Friends and Family: Not on file  . Attends Religious Services: Not on file  . Active Member of Clubs or Organizations: Not on file  . Attends Archivist Meetings: Not on file  . Marital Status: Not on file  Intimate Partner Violence:   . Fear of Current or Ex-Partner: Not on file  . Emotionally Abused: Not on file  . Physically Abused: Not on file  . Sexually Abused: Not on file    FAMILY HISTORY: Family History  Problem Relation Age of Onset  . Diabetes Mother   . Hypertension Mother     ALLERGIES:  has No Known Allergies.  MEDICATIONS:  Current Outpatient Medications  Medication Sig Dispense Refill  . acetaminophen (TYLENOL) 500 MG tablet Take 500 mg by mouth every 6 (six) hours as needed for mild pain or headache.    Marland Kitchen acyclovir (ZOVIRAX) 400 MG  tablet Take 1 tablet (400 mg total) by mouth 2 (two) times daily. 60 tablet 6  . B Complex Vitamins (B COMPLEX PO) Take 1 tablet by mouth daily.    . bictegravir-emtricitabine-tenofovir AF (BIKTARVY) 50-200-25 MG TABS tablet Take 1 tablet by mouth daily with breakfast.     . cholecalciferol (VITAMIN D3) 25 MCG (1000 UNIT) tablet Take 1 tablet (1,000 Units total) by mouth at bedtime.    . feeding supplement, ENSURE ENLIVE, (ENSURE ENLIVE) LIQD Take 237 mLs by mouth 2 (two) times daily between meals. 237 mL 12  . HYDROcodone-acetaminophen (NORCO) 5-325 MG tablet Take 1 tablet by mouth every 6 (six) hours as needed for moderate pain. 30 tablet 0  . ibuprofen (ADVIL) 200 MG tablet Take 2 tablets (400 mg total) by mouth every 6 (six) hours as needed for mild pain. (Patient not taking: Reported on 06/20/2020) 30 tablet 0  . Insulin Syringe-Needle U-100 (INSULIN SYRINGE .5CC/31GX5/16") 31G X 5/16" 0.5 ML MISC See admin instructions.    . lidocaine-prilocaine (EMLA) cream Apply to affected area once (Patient taking differently: Apply 1  application topically as needed (port access). Apply to affected area once) 30 g 3  . LORazepam (ATIVAN) 0.5 MG tablet Take 1 tablet (0.5 mg total) by mouth every 6 (six) hours as needed (for chemo induced nausea or vomiting). 30 tablet 0  . Multiple Vitamin (MULTIVITAMIN WITH MINERALS) TABS tablet Take 1 tablet by mouth at bedtime.     . ondansetron (ZOFRAN) 8 MG tablet Take 1 tablet (8 mg total) by mouth every 8 (eight) hours as needed for nausea or vomiting. 20 tablet 1  . polyethylene glycol (MIRALAX / GLYCOLAX) 17 g packet Take 17 g by mouth daily as needed for mild constipation. (Patient taking differently: Take 17 g by mouth daily. ) 14 each 0  . prochlorperazine (COMPAZINE) 10 MG tablet Take 1 tablet (10 mg total) by mouth every 6 (six) hours as needed for nausea or vomiting. 30 tablet 1  . senna-docusate (SENOKOT-S) 8.6-50 MG tablet Take 2 tablets by mouth at bedtime as needed for mild constipation.    . Sodium Chloride-Sodium Bicarb (SODIUM BICARBONATE/SODIUM CHLORIDE) SOLN 1 application by Mouth Rinse route 4 (four) times daily.     No current facility-administered medications for this visit.    REVIEW OF SYSTEMS:   A 10+ POINT REVIEW OF SYSTEMS WAS OBTAINED including neurology, dermatology, psychiatry, cardiac, respiratory, lymph, extremities, GI, GU, Musculoskeletal, constitutional, breasts, reproductive, HEENT.  All pertinent positives are noted in the HPI.  All others are negative.   PHYSICAL EXAMINATION: ECOG PERFORMANCE STATUS: 1 - Symptomatic but completely ambulatory  . Vitals:   07/05/20 1021  BP: 121/86  Pulse: (!) 104  Resp: 18  Temp: 97.9 F (36.6 C)  SpO2: 95%   Filed Weights   07/05/20 1021  Weight: 136 lb 1.6 oz (61.7 kg)   .Body mass index is 26.58 kg/m.   NAD  GENERAL:alert, in no acute distress and comfortable SKIN: no acute rashes, no significant lesions EYES: conjunctiva are pink and non-injected, sclera anicteric OROPHARYNX: MMM, no exudates, no  oropharyngeal erythema or ulceration NECK: supple, no JVD LYMPH:  no palpable lymphadenopathy in the cervical, axillary or inguinal regions LUNGS: clear to auscultation b/l with normal respiratory effort HEART: regular rate & rhythm ABDOMEN:  normoactive bowel sounds , non tender, not distended. No palpable hepatosplenomegaly.  Extremity: no pedal edema PSYCH: alert & oriented x 3 with fluent speech NEURO: no focal motor/sensory deficits  LABORATORY DATA:  I have reviewed the data as listed  . CBC Latest Ref Rng & Units 07/05/2020 06/24/2020 06/23/2020  WBC 4.0 - 10.5 K/uL 41.5(H) 2.5(L) 5.8  Hemoglobin 12.0 - 15.0 g/dL 10.5(L) 10.1(L) 10.4(L)  Hematocrit 36 - 46 % 33.5(L) 30.7(L) 32.6(L)  Platelets 150 - 400 K/uL 120(L) 249 265    . CMP Latest Ref Rng & Units 07/05/2020 06/24/2020 06/23/2020  Glucose 70 - 99 mg/dL 131(H) 147(H) 159(H)  BUN 6 - 20 mg/dL 9 16 14   Creatinine 0.44 - 1.00 mg/dL 0.75 0.61 0.62  Sodium 135 - 145 mmol/L 141 139 139  Potassium 3.5 - 5.1 mmol/L 3.8 3.9 3.7  Chloride 98 - 111 mmol/L 104 101 103  CO2 22 - 32 mmol/L 31 27 29   Calcium 8.9 - 10.3 mg/dL 10.7(H) 9.7 9.9  Total Protein 6.5 - 8.1 g/dL 6.6 6.0(L) 6.7  Total Bilirubin 0.3 - 1.2 mg/dL 0.3 0.3 0.5  Alkaline Phos 38 - 126 U/L 133(H) 60 66  AST 15 - 41 U/L 21 28 17   ALT 0 - 44 U/L 24 38 19   04/08/2020 Pancreatic Mass Surgical Pathology Report [phone]):     RADIOGRAPHIC STUDIES: I have personally reviewed the radiological images as listed and agreed with the findings in the report. NM PET Image Restag (PS) Skull Base To Thigh  Result Date: 06/11/2020 CLINICAL DATA:  Subsequent treatment strategy for diffuse large B-cell lymphoma. EXAM: NUCLEAR MEDICINE PET SKULL BASE TO THIGH TECHNIQUE: 6.4 mCi F-18 FDG was injected intravenously. Full-ring PET imaging was performed from the skull base to thigh after the radiotracer. CT data was obtained and used for attenuation correction and anatomic  localization. Fasting blood glucose: 101 mg/dl COMPARISON:  None. FINDINGS: Mediastinal blood pool activity: SUV max 1.9 Liver activity: SUV max 2.7 NECK: No hypermetabolic lymph nodes in the neck. Incidental CT findings: none CHEST: No hypermetabolic mediastinal or hilar nodes. No suspicious pulmonary nodules on the CT scan. Incidental CT findings: none ABDOMEN/PELVIS: Marked reduction in volume and metabolic activity of splenic mass. Mass currently measures 6.0 by 6.4 cm decreased from 13.3 by 10.0 cm. Metabolic activity is greatly reduced with mild heterogeneous metabolic activity within the mass with SUV max equal 4.5 decreased from SUV max equal 15.7. No hypermetabolic abdominopelvic lymph nodes. Incidental CT findings: Fibroid uterus. Abdominal wall skin lesions again noted. SKELETON: There is diffuse intense marrow activity consistent with GCSF type response. Incidental CT findings: none IMPRESSION: 1. Marked reduction in size and metabolic activity of splenic mass. Residual mass has mild metabolic activity slightly greater than liver activity ( [place] 4). 2. No new metastatic disease. 3. Diffuse marrow activity related to GCSF type response. Electronically Signed   By: Suzy Bouchard M.D.   On: 06/11/2020 10:37   DG FLUORO GUIDE LUMBAR PUNCTURE  Result Date: 06/21/2020 CLINICAL DATA:  Diffuse large B-cell lymphoma. EXAM: FLUOROSCOPICALLY GUIDED LUMBAR PUNCTURE FOR INTRATHECAL CHEMOTHERAPY FLUOROSCOPY TIME:  1 minutes 0 seconds PROCEDURE: Informed consent was obtained from the patient prior to the procedure, including potential complications of headache, allergy, and pain. Safety time-out was performed. With the patient prone, the lower back was prepped with Betadine. 1% Lidocaine was used for local anesthesia. Lumbar puncture was performed at the L3-4 level using a 20 gauge needle with return of bloody CSF. 5 mL of Omnipaque 180 contrast was injected to confirm intrathecal position of the needle. 5  mL of methotrexate chemotherapy obtained from the hospital pharmacy was injected into the subarachnoid space.  The needle was subsequently removed, and a sterile bandage was applied at the skin puncture site. The patient tolerated the procedure well without apparent complication. IMPRESSION: Successful lumbar puncture and intrathecal chemotherapy administration under fluoroscopic guidance. No evidence of immediate complication. Electronically Signed   By: Marlaine Hind M.D.   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 Hemoglobin C], childhood malaria. Patient notes that she had a CT of the abdomen sometime in 2020 that showed no acute pathology.This was not [facility]. Not accessible to [place] at this point. She did have an MRI of the abdomen in July 2019 which showed indeterminate splenic lesions.No other acute abdominal pathology.No concern with hepatocellular carcinoma.  Patient is presenting now with  #1Pancreatic mass along with retroperitoneal lymphadenopathy and diffuse splenic lesions. No internal necrosis within the mass noted. Diffuse splenomegaly. Left periaortic lymph node causing mild displacement of the third portion of the duodenum.  Significantly elevated LDH levels. CA 19-9 and CEA levels unrevealing  # 2 Left sided large pleural effusion and lung atelectasis. S/p diagnostic and therapeutic thoracentesis -- lymphocytic predominance @ 80%  Overall picture concerning for possible  High grade B cell lymphoma vs primary pancreatic malignancy (though CA 19-9 levels indeterminate and not significantly elevated.  HIV could be risk factors for high grade EBV driven lymphomas  Patient  symptomatology has developed rather quickly over the last few weeks.  #3 microcytic anemia-chronic.Some element of iron deficiency versus hemoglobinopathy versus anemia of chronic disease related to malignancy. Patient has received 1 dose of IV Feraheme Will rpt 2nd dose in 1 week.  #4 history of HIV/AIDS follows with Dr. Melynda Keller and at [facility].  # Leucocytosis - due to G-CSF PLAN: -Discussed pt labwork today, 07/05/20; Hgb & PLT are good, WBC have responded well, Calcium is borderline high - reflects dehydration -The pt has no prohibitive toxicities from continuing C5 EPOCH in 1 week.  IT MTX on day 2 C5. -Inpatient [facility] chemotherapy for 5 days from 09/07.  -Recommended that the pt continue to eat well, drink at least 48-64 oz of water each day, and walk 20-30 minutes each day.  -Recommend pt continue Acyclovir for HSV prophylaxis.  -Will see back in 3 weeks with labs   FOLLOW UP: -Inpatient admission for C5 of EPOCH from 07/12/2020 for 5 days -outpatient Rituxan and neulasta on 07/18/2020 -RTC with Dr Irene Limbo with portflush and labs on 9/20    The total time spent in the appt was 30 minutes and more than 50% was on counseling and direct patient cares ordering and management of chemotherapy.  All of the patient's questions were answered with apparent satisfaction. The patient knows to call the clinic with any problems, questions or concerns.   Sullivan Lone MD [place] AAHIVMS [facility] Hematology/Oncology Physician [facility]  (Office):       [phone] (Work cell):  [phone] (Fax):           [phone]  07/05/2020 11:02 AM  I, Yevette Edwards, am acting as a scribe for Dr. Sullivan Lone.   .I have reviewed the above documentation for accuracy and completeness, and I agree with the above. Brunetta Genera MD

## 2020-07-07 ENCOUNTER — Telehealth: Payer: Self-pay | Admitting: *Deleted

## 2020-07-07 NOTE — Telephone Encounter (Signed)
Patient scheduled per Dr.Kale for inpatient admission 07/12/20. Shawnin bed placement contacted with patient diagnosis and contact information. Patient will be contacted with admission time on 07/12/20 Covid screening test scheduled for 07/09/20 @ 1000AM [employer] testing site. Patientcontacted with all information, verbalized understanding Email sent to Inpatient Chemotherapy group to notify of upcoming admission

## 2020-07-09 ENCOUNTER — Other Ambulatory Visit (HOSPITAL_COMMUNITY)
Admission: RE | Admit: 2020-07-09 | Discharge: 2020-07-09 | Disposition: A | Discharge status: Home / Self Care | Payer: 59 | Source: Ambulatory Visit | Attending: Hematology | Admitting: Hematology

## 2020-07-09 DIAGNOSIS — Z20822 Contact with and (suspected) exposure to covid-19: Secondary | ICD-10-CM | POA: Insufficient documentation

## 2020-07-09 DIAGNOSIS — Z01812 Encounter for preprocedural laboratory examination: Secondary | ICD-10-CM | POA: Insufficient documentation

## 2020-07-09 LAB — SARS CORONAVIRUS 2 (TAT 6-24 HRS): SARS Coronavirus 2: NEGATIVE

## 2020-07-12 ENCOUNTER — Inpatient Hospital Stay (HOSPITAL_COMMUNITY)
Admission: AD | Admit: 2020-07-12 | Discharge: 2020-07-16 | DRG: 847 | Disposition: A | Discharge status: Home / Self Care | Payer: 59 | Source: Ambulatory Visit | Attending: Hematology | Admitting: Hematology

## 2020-07-12 ENCOUNTER — Encounter (HOSPITAL_COMMUNITY): Payer: Self-pay | Admitting: Hematology

## 2020-07-12 ENCOUNTER — Other Ambulatory Visit: Payer: Self-pay

## 2020-07-12 DIAGNOSIS — D649 Anemia, unspecified: Secondary | ICD-10-CM | POA: Diagnosis not present

## 2020-07-12 DIAGNOSIS — C8338 Diffuse large B-cell lymphoma, lymph nodes of multiple sites: Secondary | ICD-10-CM

## 2020-07-12 DIAGNOSIS — C8580 Other specified types of non-Hodgkin lymphoma, unspecified site: Secondary | ICD-10-CM | POA: Diagnosis not present

## 2020-07-12 DIAGNOSIS — Z7189 Other specified counseling: Secondary | ICD-10-CM

## 2020-07-12 DIAGNOSIS — B20 Human immunodeficiency virus [HIV] disease: Secondary | ICD-10-CM | POA: Diagnosis present

## 2020-07-12 DIAGNOSIS — J90 Pleural effusion, not elsewhere classified: Secondary | ICD-10-CM

## 2020-07-12 DIAGNOSIS — D509 Iron deficiency anemia, unspecified: Secondary | ICD-10-CM | POA: Diagnosis present

## 2020-07-12 DIAGNOSIS — D630 Anemia in neoplastic disease: Secondary | ICD-10-CM | POA: Diagnosis present

## 2020-07-12 DIAGNOSIS — Z5111 Encounter for antineoplastic chemotherapy: Principal | ICD-10-CM

## 2020-07-12 DIAGNOSIS — R109 Unspecified abdominal pain: Secondary | ICD-10-CM | POA: Diagnosis not present

## 2020-07-12 DIAGNOSIS — C8336 Diffuse large B-cell lymphoma, intrapelvic lymph nodes: Secondary | ICD-10-CM | POA: Diagnosis present

## 2020-07-12 DIAGNOSIS — E099 Drug or chemical induced diabetes mellitus without complications: Secondary | ICD-10-CM

## 2020-07-12 DIAGNOSIS — Z20822 Contact with and (suspected) exposure to covid-19: Secondary | ICD-10-CM | POA: Diagnosis present

## 2020-07-12 DIAGNOSIS — B1910 Unspecified viral hepatitis B without hepatic coma: Secondary | ICD-10-CM | POA: Diagnosis not present

## 2020-07-12 DIAGNOSIS — C8588 Other specified types of non-Hodgkin lymphoma, lymph nodes of multiple sites: Secondary | ICD-10-CM | POA: Diagnosis not present

## 2020-07-12 LAB — CBC WITH DIFFERENTIAL/PLATELET
Abs Immature Granulocytes: 0.16 10*3/uL — ABNORMAL HIGH (ref 0.00–0.07)
Basophils Absolute: 0.1 10*3/uL (ref 0.0–0.1)
Basophils Relative: 1 %
Eosinophils Absolute: 0.1 10*3/uL (ref 0.0–0.5)
Eosinophils Relative: 1 %
HCT: 32.1 % — ABNORMAL LOW (ref 36.0–46.0)
Hemoglobin: 10 g/dL — ABNORMAL LOW (ref 12.0–15.0)
Immature Granulocytes: 3 %
Lymphocytes Relative: 15 %
Lymphs Abs: 0.8 10*3/uL (ref 0.7–4.0)
MCH: 27.4 pg (ref 26.0–34.0)
MCHC: 31.2 g/dL (ref 30.0–36.0)
MCV: 87.9 fL (ref 80.0–100.0)
Monocytes Absolute: 0.7 10*3/uL (ref 0.1–1.0)
Monocytes Relative: 13 %
Neutro Abs: 3.6 10*3/uL (ref 1.7–7.7)
Neutrophils Relative %: 67 %
Platelets: 228 10*3/uL (ref 150–400)
RBC: 3.65 MIL/uL — ABNORMAL LOW (ref 3.87–5.11)
RDW: 25.9 % — ABNORMAL HIGH (ref 11.5–15.5)
WBC: 5.4 10*3/uL (ref 4.0–10.5)
nRBC: 2 % — ABNORMAL HIGH (ref 0.0–0.2)

## 2020-07-12 LAB — COMPREHENSIVE METABOLIC PANEL
ALT: 20 U/L (ref 0–44)
AST: 21 U/L (ref 15–41)
Albumin: 3.5 g/dL (ref 3.5–5.0)
Alkaline Phosphatase: 64 U/L (ref 38–126)
Anion gap: 8 (ref 5–15)
BUN: 15 mg/dL (ref 6–20)
CO2: 25 mmol/L (ref 22–32)
Calcium: 8.7 mg/dL — ABNORMAL LOW (ref 8.9–10.3)
Chloride: 106 mmol/L (ref 98–111)
Creatinine, Ser: 0.5 mg/dL (ref 0.44–1.00)
GFR calc Af Amer: >60 mL/min (ref >60)
GFR calc non Af Amer: >60 mL/min (ref >60)
Glucose, Bld: 120 mg/dL — ABNORMAL HIGH (ref 70–99)
Potassium: 3.7 mmol/L (ref 3.5–5.1)
Sodium: 139 mmol/L (ref 135–145)
Total Bilirubin: 0.4 mg/dL (ref 0.3–1.2)
Total Protein: 6.2 g/dL — ABNORMAL LOW (ref 6.5–8.1)

## 2020-07-12 LAB — PREGNANCY, URINE: Preg Test, Ur: NEGATIVE

## 2020-07-12 MED ORDER — SODIUM CHLORIDE 0.9 % IV SOLN: INTRAVENOUS | Status: DC

## 2020-07-12 MED ORDER — PREDNISONE 20 MG PO TABS
60.0000 mg | ORAL_TABLET | Freq: Every day | ORAL | Status: AC
  Administered 2020-07-12 – 2020-07-16 (×5): 60 mg via ORAL
  Filled 2020-07-12 (×5): qty 3

## 2020-07-12 MED ORDER — PROCHLORPERAZINE MALEATE 10 MG PO TABS: 10.0000 mg | ORAL_TABLET | Freq: Four times a day (QID) | ORAL | Status: DC | PRN

## 2020-07-12 MED ORDER — SENNOSIDES-DOCUSATE SODIUM 8.6-50 MG PO TABS
2.0000 | ORAL_TABLET | Freq: Every evening | ORAL | Status: DC | PRN
  Administered 2020-07-13: 2 via ORAL
  Filled 2020-07-12: qty 2

## 2020-07-12 MED ORDER — HOT PACK MISC ONCOLOGY
1.0000 | Freq: Once | Status: DC | PRN
  Filled 2020-07-12: qty 1

## 2020-07-12 MED ORDER — SODIUM CHLORIDE 0.9 % IV SOLN
Freq: Once | INTRAVENOUS | Status: AC
  Administered 2020-07-12: 18 mg via INTRAVENOUS
  Filled 2020-07-12: qty 4

## 2020-07-12 MED ORDER — SODIUM BICARBONATE/SODIUM CHLORIDE MOUTHWASH
1.0000 "application " | Freq: Four times a day (QID) | OROMUCOSAL | Status: DC
  Administered 2020-07-12 – 2020-07-15 (×11): 1 via OROMUCOSAL
  Filled 2020-07-12: qty 1000

## 2020-07-12 MED ORDER — B COMPLEX-C PO TABS
1.0000 | ORAL_TABLET | Freq: Every day | ORAL | Status: DC
  Administered 2020-07-13 – 2020-07-16 (×4): 1 via ORAL
  Filled 2020-07-12 (×4): qty 1

## 2020-07-12 MED ORDER — SODIUM CHLORIDE 0.9% FLUSH
10.0000 mL | Freq: Two times a day (BID) | INTRAVENOUS | Status: DC
  Administered 2020-07-12: 20 mL
  Administered 2020-07-12 – 2020-07-13 (×2): 10 mL

## 2020-07-12 MED ORDER — ENSURE ENLIVE PO LIQD
237.0000 mL | Freq: Two times a day (BID) | ORAL | Status: DC
  Administered 2020-07-12 – 2020-07-16 (×8): 237 mL via ORAL

## 2020-07-12 MED ORDER — VITAMIN D 25 MCG (1000 UNIT) PO TABS
1000.0000 [IU] | ORAL_TABLET | Freq: Every day | ORAL | Status: DC
  Administered 2020-07-12 – 2020-07-15 (×4): 1000 [IU] via ORAL
  Filled 2020-07-12 (×4): qty 1

## 2020-07-12 MED ORDER — POTASSIUM CHLORIDE CRYS ER 20 MEQ PO TBCR
20.0000 meq | EXTENDED_RELEASE_TABLET | Freq: Every day | ORAL | Status: DC
  Administered 2020-07-12 – 2020-07-16 (×5): 20 meq via ORAL
  Filled 2020-07-12 (×5): qty 1

## 2020-07-12 MED ORDER — BICTEGRAVIR-EMTRICITAB-TENOFOV 50-200-25 MG PO TABS
1.0000 | ORAL_TABLET | Freq: Every day | ORAL | Status: DC
  Administered 2020-07-13 – 2020-07-16 (×4): 1 via ORAL
  Filled 2020-07-12 (×4): qty 1

## 2020-07-12 MED ORDER — HYDROCODONE-ACETAMINOPHEN 5-325 MG PO TABS: 1.0000 | ORAL_TABLET | Freq: Four times a day (QID) | ORAL | Status: DC | PRN

## 2020-07-12 MED ORDER — POLYETHYLENE GLYCOL 3350 17 G PO PACK
17.0000 g | PACK | Freq: Every day | ORAL | Status: DC
  Administered 2020-07-13 – 2020-07-16 (×4): 17 g via ORAL
  Filled 2020-07-12 (×4): qty 1

## 2020-07-12 MED ORDER — ACETAMINOPHEN 500 MG PO TABS: 500.0000 mg | ORAL_TABLET | Freq: Four times a day (QID) | ORAL | Status: DC | PRN

## 2020-07-12 MED ORDER — LORAZEPAM 0.5 MG PO TABS: 0.5000 mg | ORAL_TABLET | Freq: Four times a day (QID) | ORAL | Status: DC | PRN

## 2020-07-12 MED ORDER — COLD PACK MISC ONCOLOGY
1.0000 | Freq: Once | Status: DC | PRN
  Filled 2020-07-12: qty 1

## 2020-07-12 MED ORDER — IBUPROFEN 200 MG PO TABS: 400.0000 mg | ORAL_TABLET | Freq: Four times a day (QID) | ORAL | Status: DC | PRN

## 2020-07-12 MED ORDER — SODIUM CHLORIDE 0.9% FLUSH: 10.0000 mL | INTRAVENOUS | Status: DC | PRN

## 2020-07-12 MED ORDER — ACYCLOVIR 400 MG PO TABS
400.0000 mg | ORAL_TABLET | Freq: Two times a day (BID) | ORAL | Status: DC
  Administered 2020-07-12 – 2020-07-16 (×8): 400 mg via ORAL
  Filled 2020-07-12 (×8): qty 1

## 2020-07-12 MED ORDER — ADULT MULTIVITAMIN W/MINERALS CH
1.0000 | ORAL_TABLET | Freq: Every day | ORAL | Status: DC
  Administered 2020-07-12 – 2020-07-15 (×4): 1 via ORAL
  Filled 2020-07-12 (×4): qty 1

## 2020-07-12 MED ORDER — MORPHINE SULFATE (PF) 2 MG/ML IV SOLN
1.0000 mg | INTRAVENOUS | Status: DC | PRN
  Administered 2020-07-13: 1 mg via INTRAVENOUS
  Filled 2020-07-12: qty 1

## 2020-07-12 MED ORDER — B COMPLEX PO TABS: 1.0000 | ORAL_TABLET | Freq: Every day | ORAL | Status: DC

## 2020-07-12 MED ORDER — CHLORHEXIDINE GLUCONATE CLOTH 2 % EX PADS
6.0000 | MEDICATED_PAD | Freq: Every day | CUTANEOUS | Status: DC
  Administered 2020-07-13 – 2020-07-16 (×4): 6 via TOPICAL

## 2020-07-12 MED ORDER — LIDOCAINE-PRILOCAINE 2.5-2.5 % EX CREA: 1.0000 "application " | TOPICAL_CREAM | CUTANEOUS | Status: DC | PRN

## 2020-07-12 MED ORDER — VINCRISTINE SULFATE CHEMO INJECTION 1 MG/ML
Freq: Once | INTRAVENOUS | Status: AC
  Filled 2020-07-12: qty 8

## 2020-07-12 MED ORDER — ONDANSETRON HCL 8 MG PO TABS: 8.0000 mg | ORAL_TABLET | Freq: Three times a day (TID) | ORAL | Status: DC | PRN

## 2020-07-12 NOTE — Progress Notes (Signed)
Chemotherapy dosages, patient's BSA, total VTBI, 2 pharmacy signatures, expiration date, and patient identifiers independently verified by me and by Cline Crock, RN.

## 2020-07-12 NOTE — Progress Notes (Signed)
Patient due for chemotherapy today.  Chemotherapy dosages, patient's BSA, total VTBI, 2 pharmacy signatures, expiration date, and patient identifiers independently verified by me and by Aldean Baker, RN. Zandra Abts [facility] 07/12/2020

## 2020-07-12 NOTE — H&P (Addendum)
[place]  [phone] [phone]   MEDICAL ONCOLOGY - ADMISSION H&P  Reason for Referral: Cycle #5 EPOCH-R  HPI: [name] is a wonderful 44 y.o. female with a past medical history significant for hepatitis B, HIV, chronic anemia who presented to [facility] with abdominal pain. She had a 2-week history of early satiety, poor appetite and, 5 pound weight loss. Abdominal pain is typically postprandial and mainly at night after dinner while laying in bed. Has been associated with nausea improves after vomiting. Labs on admission showed a hemoglobin of 9.3, MCV 67.4, platelet count 554,000. Lipase was mildly elevated at 123. 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  During her initial consult, she was having ongoingpain is primarily in her epigastric area and radiates to the left side of her abdomen. States pain is worse when laying down after she eats. She states that she has had this pain for at least 2 weeks. Her abdominal pain is worse after eating. She has had some nausea and intermittent vomiting. Vomiting makes her pain better. She was attributing her symptoms to some the medications that she was taking for fertility treatments. She reports a poor appetite and about a 5 pound weight loss. She is not having any fevers or chills. Denies night sweats. She has not noticed any palpable lymphadenopathy. The patient underwent a biopsy of her pancreatic mass on 04/08/2020 ([id number]) with results feeling diffuse  large B-cell lymphoma.  It was recommended for her to undergo systemic chemotherapy with EPOCH-R.   The patient reports that she is feeling well overall today.  Denies mucositis, abdominal pain, nausea, vomiting.  Denies chest pain or shortness of breath. No bleeding reported.  Appetite remains good. She is here for admission for cycle #5 of EPOCH-R.      Past Medical History:  Diagnosis Date  . Anemia   . Diffuse large B-cell lymphoma of lymph nodes of multiple regions ([place]) 04/12/2020  . 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 History of blood transfusion    childhood  . HIV (human immunodeficiency virus infection) ([place])   . Immune deficiency disorder ([place])                            04/15/2020  . MYOMECTOMY N/A 11/29/2016   Procedure: MYOMECTOMY;  Surgeon: Waymon Amato, MD;  Location: [facility];  Service: Gynecology;  Laterality: N/A;    Medication Dose Route Frequency Provider Last Rate Last Admin  . acetaminophen (TYLENOL) tablet 650 mg  650 mg Oral Q4H PRN Maryanna Shape, NP      . Derrill Memo ON 05/31/2020] bictegravir-emtricitabine-tenofovir AF (BIKTARVY) 50-200-25 MG per tablet 1 tablet  1 tablet Oral Q breakfast Curcio, Roselie Awkward, NP      . Chlorhexidine Gluconate Cloth 2 % PADS 6 each  6 each Topical Daily Curcio, Kristin R, NP      . cholecalciferol (VITAMIN D3) tablet 1,000 Units  1,000 Units Oral QHS Curcio, Kristin R, NP      . feeding supplement (ENSURE ENLIVE) (ENSURE ENLIVE) liquid 237 mL  237 mL Oral BID BM Curcio, Kristin R, NP      . HYDROcodone-acetaminophen (NORCO/VICODIN) 5-325 MG per tablet 1 tablet  1 tablet Oral Q6H PRN Curcio, Roselie Awkward, NP      . lidocaine-prilocaine (EMLA) cream 1 application  1 application Topical PRN Curcio, Roselie Awkward, NP      . LORazepam (ATIVAN) tablet 0.5 mg  0.5 mg Oral Q6H PRN Curcio, Roselie Awkward, NP      . multivitamin with minerals tablet 1 tablet  1 tablet Oral QHS Curcio, Kristin R, NP      . ondansetron (ZOFRAN) tablet 8 mg  8 mg Oral  Q8H PRN Curcio, Kristin R, NP      . pantoprazole (PROTONIX) EC tablet 40 mg  40 mg Oral Daily Curcio, Kristin R, NP      . polyethylene glycol (MIRALAX / GLYCOLAX) packet 17 g  17 g Oral Daily PRN Curcio, Roselie Awkward, NP      . potassium chloride SA (KLOR-CON) CR tablet 20 mEq  20 mEq Oral Once Maryanna Shape, NP      . prochlorperazine (COMPAZINE) tablet 10 mg  10 mg Oral Q6H PRN Curcio, Kristin R, NP      . sodium bicarbonate/sodium chloride mouthwash [id number]  1 application Mouth Rinse QID Curcio, Kristin R, NP      . sodium chloride flush (NS) 0.9 % injection 10-40 mL  10-40 mL Intracatheter Q12H Curcio, Kristin R, NP      . sodium chloride flush (NS) 0.9 % injection 10-40 mL  10-40 mL Intracatheter PRN Curcio, Roselie Awkward, NP           Family History  Problem Relation Age of Onset  . Diabetes Mother   . Hypertension Mother   Relation Age of Onset  . Diabetes Mother   . Hypertension Mother      Socioeconomic History  . Marital status: Married    Spouse name: Not on file  . Number of children: Not on file  . Years of education: Not on file  . Highest education level: Not on file  Occupational History  . Not on file  Tobacco Use  . Smoking status: Never Smoker  . Smokeless tobacco: Never Used  Substance and Sexual Activity  . Alcohol use: Yes    Comment: occ  . Drug use: No  . Sexual activity: Yes  Other Topics Concern  . Not on file  Social History Narrative  . Not on file   Social Determinants of Health   Financial Resource Strain:   . Difficulty of Paying Living Expenses:   Food Insecurity:   . Worried About Charity fundraiser in the Last Year:   . Arboriculturist in the Last Year:   Transportation Needs:   . Film/video editor (Medical):   Marland Kitchen Lack of Transportation (Non-Medical):   Physical Activity:   . Days of Exercise per Week:   . Minutes of Exercise per Session:   Stress:   . Feeling of Stress :   Social Connections:   . Frequency of Communication  with Friends and Family:   . Frequency of Social Gatherings with Friends and Family:   . Attends Religious Services:   . Active Member of Clubs or Organizations:   . Attends Archivist Meetings:   Marland Kitchen Marital Status:   Intimate Partner Violence:   . Fear of Current or Ex-Partner:   . Emotionally Abused:   Marland Kitchen Physically Abused:   .  Sexually Abused:   :  Review of Systems: A comprehensive 14 point review of systems was negative except as noted in the HPI.  Exam: Patient Vitals for the past 24 hrs:  BP Temp Temp src Pulse Resp SpO2 Height Weight  05/30/20 1127 119/85 98.3 F (36.8 C) Oral 93 16 99 % 5' (1.524 m) 58.3 kg    General:  well-nourished in no acute distress.   Eyes:  no scleral icterus.   ENT:  There were no oropharyngeal lesions.   Neck was without thyromegaly.   Lymphatics:  Negative cervical, supraclavicular or axillary adenopathy.   Respiratory: Minutes left base. Cardiovascular:  Regular rate and rhythm, S1/S2, without murmur, rub or gallop.  There was no pedal edema.   GI:  abdomen was soft, flat, nontender, nondistended, without organomegaly. Musculoskeletal:  no spinal tenderness of palpation of vertebral spine.   Skin: No bruising or petechiae.  Keloids noted over her chest and abdomen. Neuro exam was nonfocal. Patient was alert and oriented.  Attention was good.   Language was appropriate.  Mood was normal without depression.  Speech was not pressured.  Thought content was not tangential.     Lab Results  Component Value Date   WBC 7.4 05/30/2020   HGB 10.0 (L) 05/30/2020   HCT 32.0 (L) 05/30/2020   PLT 233 05/30/2020   GLUCOSE 87 05/30/2020   ALT 20 05/30/2020   AST 17 05/30/2020   NA 138 05/30/2020   K 3.8 05/30/2020   CL 101 05/30/2020   CREATININE 0.53 05/30/2020   BUN 11 05/30/2020   CO2 27 05/30/2020    DG Chest 2 View  Result Date: 05/12/2020 CLINICAL DATA:  Chronic anemia with weight loss and decreased appetite EXAM: CHEST - 2 VIEW  COMPARISON:  04/14/2020 FINDINGS: Cardiac shadows within normal limits. Right chest wall port is noted in satisfactory position. Small left pleural effusion is noted but decreased from the prior exam. Underlying atelectatic changes are noted. No bony abnormality is seen. IMPRESSION: Decrease in the degree of left-sided pleural effusion when compared with the prior exam. Electronically Signed   By: Inez Catalina M.D.   On: 05/12/2020 19:01     DG Chest 2 View  Result Date: 05/12/2020 CLINICAL DATA:  Chronic anemia with weight loss and decreased appetite EXAM: CHEST - 2 VIEW COMPARISON:  04/14/2020 FINDINGS: Cardiac shadows within normal limits. Right chest wall port is noted in satisfactory position. Small left pleural effusion is noted but decreased from the prior exam. Underlying atelectatic changes are noted. No bony abnormality is seen. IMPRESSION: Decrease in the degree of left-sided pleural effusion when compared with the prior exam. Electronically Signed   By: Inez Catalina M.D.   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 Hemoglobin C], childhood malaria. Patient notes that she had a CT of the abdomen sometime in 2020 that showed no acute pathology.This was not [facility]. Not accessible to [place] at this point. She did have an MRI of the abdomen in July 2019 which showed indeterminate splenic lesions.No other acute abdominal pathology.No concern with hepatocellular carcinoma.  Patient is presenting now with  #1Pancreatic massalong with retroperitoneal lymphadenopathy and diffuse splenic lesions.due to diffuse large b cell lymphoma No internal necrosis  within the mass noted. Diffuse  splenomegaly. Left periaortic lymph node causing mild displacement of the third portion of the duodenum.  Significantly elevated LDH levels. 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  PET scan on 06/11/2020 following 3 cycles of chemotherapy showed "1. Marked reduction in size and metabolic activity of splenic mass. Residual mass has mild metabolic activity slightly greater than liver activity ( Deauville 4). 2. No new metastatic disease. 3. Diffuse marrow activity related to GCSF type response."  # 2 Left sided large pleural effusion and lung atelectasis. S/p diagnostic and therapeutic thoracentesis -- lymphocytic predominance @ 80%  HIV could be risk factors for high grade EBV driven lymphomas  Patient symptomatology has developed rather quickly over the last few weeks.  #28microcytic anemia-chronic.Some element of iron deficiency versus hemoglobinopathy versus anemia of chronic disease related to malignancy. Patient has received 1 dose of IV Feraheme on 04/08/2020 and repeated on 04/22/2020  #4history of HIV/AIDS follows with Dr. Melynda Keller and at [facility].   PLAN: -CBC with differential and CMET from today have been reviewed and are adequate for treatment.  Urine pregnancy test negative.  She will proceed with day 1 of cycle 5 of chemotherapy today as planned. -Check daily CBC with differential and CMET -Continue as needed antiemetics. -Scheduled MiraLAX and PRN Senokot-S have been ordered. -Will need to be cautious with rituximab to prevent recurrent hepatitis B flare.  Repeat HBV DNA undetectable on 06/02/2020. -continue with Biktarvy - no interactions - this was confirmed with patient ID physician and our pharmacy. -Continue Acyclovir prophylaxis.  -We will plan for intrathecal methotrexate on day 2 of this cycle of chemotherapy.  The patient is scheduled the cancer center on  07/18/2020 for Rituxan and Neulasta. Will schedule follow-up visit (scheduling message sent).   Mikey Bussing, DNP, AGPCNP-BC, AOCNP   ADDENDUM  .Patient was Personally and independently interviewed, examined and relevant elements of the history of present illness were reviewed in details and an assessment and plan was created. All elements of the patient's history of present illness , assessment and plan were discussed in details with Mikey Bussing, DNP, AGPCNP-BC, AOCNP. The above documentation reflects our combined findings assessment and plan.  Sullivan Lone MD MS

## 2020-07-13 ENCOUNTER — Inpatient Hospital Stay (HOSPITAL_COMMUNITY): Payer: 59

## 2020-07-13 DIAGNOSIS — C8580 Other specified types of non-Hodgkin lymphoma, unspecified site: Secondary | ICD-10-CM

## 2020-07-13 LAB — COMPREHENSIVE METABOLIC PANEL
ALT: 24 U/L (ref 0–44)
AST: 25 U/L (ref 15–41)
Albumin: 3.7 g/dL (ref 3.5–5.0)
Alkaline Phosphatase: 69 U/L (ref 38–126)
Anion gap: 7 (ref 5–15)
BUN: 14 mg/dL (ref 6–20)
CO2: 24 mmol/L (ref 22–32)
Calcium: 9.8 mg/dL (ref 8.9–10.3)
Chloride: 106 mmol/L (ref 98–111)
Creatinine, Ser: 0.54 mg/dL (ref 0.44–1.00)
GFR calc Af Amer: >60 mL/min (ref >60)
GFR calc non Af Amer: >60 mL/min (ref >60)
Glucose, Bld: 277 mg/dL — ABNORMAL HIGH (ref 70–99)
Potassium: 4.3 mmol/L (ref 3.5–5.1)
Sodium: 137 mmol/L (ref 135–145)
Total Bilirubin: 0.3 mg/dL (ref 0.3–1.2)
Total Protein: 6.8 g/dL (ref 6.5–8.1)

## 2020-07-13 LAB — CBC WITH DIFFERENTIAL/PLATELET
Abs Immature Granulocytes: 0.21 10*3/uL — ABNORMAL HIGH (ref 0.00–0.07)
Basophils Absolute: 0 10*3/uL (ref 0.0–0.1)
Basophils Relative: 0 %
Eosinophils Absolute: 0 10*3/uL (ref 0.0–0.5)
Eosinophils Relative: 0 %
HCT: 34.7 % — ABNORMAL LOW (ref 36.0–46.0)
Hemoglobin: 10.9 g/dL — ABNORMAL LOW (ref 12.0–15.0)
Immature Granulocytes: 3 %
Lymphocytes Relative: 9 %
Lymphs Abs: 0.5 10*3/uL — ABNORMAL LOW (ref 0.7–4.0)
MCH: 27.5 pg (ref 26.0–34.0)
MCHC: 31.4 g/dL (ref 30.0–36.0)
MCV: 87.6 fL (ref 80.0–100.0)
Monocytes Absolute: 0.1 10*3/uL (ref 0.1–1.0)
Monocytes Relative: 1 %
Neutro Abs: 5.3 10*3/uL (ref 1.7–7.7)
Neutrophils Relative %: 87 %
Platelets: 205 10*3/uL (ref 150–400)
RBC: 3.96 MIL/uL (ref 3.87–5.11)
RDW: 25 % — ABNORMAL HIGH (ref 11.5–15.5)
WBC: 6.1 10*3/uL (ref 4.0–10.5)
nRBC: 0.7 % — ABNORMAL HIGH (ref 0.0–0.2)

## 2020-07-13 MED ORDER — ACETAMINOPHEN 325 MG PO TABS
650.0000 mg | ORAL_TABLET | Freq: Once | ORAL | Status: AC
  Administered 2020-07-13: 650 mg via ORAL
  Filled 2020-07-13: qty 2

## 2020-07-13 MED ORDER — LIDOCAINE HCL 1 % IJ SOLN
INTRAMUSCULAR | Status: AC
  Filled 2020-07-13: qty 20

## 2020-07-13 MED ORDER — VINCRISTINE SULFATE CHEMO INJECTION 1 MG/ML
Freq: Once | INTRAVENOUS | Status: AC
  Filled 2020-07-13: qty 8

## 2020-07-13 MED ORDER — SODIUM CHLORIDE 0.9 % IV SOLN
Freq: Once | INTRAVENOUS | Status: AC
  Administered 2020-07-13: 18 mg via INTRAVENOUS
  Filled 2020-07-13: qty 4

## 2020-07-13 MED ORDER — SODIUM CHLORIDE (PF) 0.9 % IJ SOLN
Freq: Once | INTRAMUSCULAR | Status: AC
  Filled 2020-07-13: qty 0.48

## 2020-07-14 DIAGNOSIS — C8580 Other specified types of non-Hodgkin lymphoma, unspecified site: Secondary | ICD-10-CM

## 2020-07-14 LAB — COMPREHENSIVE METABOLIC PANEL
ALT: 21 U/L (ref 0–44)
AST: 17 U/L (ref 15–41)
Albumin: 3.6 g/dL (ref 3.5–5.0)
Alkaline Phosphatase: 75 U/L (ref 38–126)
Anion gap: 7 (ref 5–15)
BUN: 14 mg/dL (ref 6–20)
CO2: 26 mmol/L (ref 22–32)
Calcium: 9.6 mg/dL (ref 8.9–10.3)
Chloride: 104 mmol/L (ref 98–111)
Creatinine, Ser: 0.55 mg/dL (ref 0.44–1.00)
GFR calc Af Amer: >60 mL/min (ref >60)
GFR calc non Af Amer: >60 mL/min (ref >60)
Glucose, Bld: 253 mg/dL — ABNORMAL HIGH (ref 70–99)
Potassium: 3.8 mmol/L (ref 3.5–5.1)
Sodium: 137 mmol/L (ref 135–145)
Total Bilirubin: 0.4 mg/dL (ref 0.3–1.2)
Total Protein: 6.7 g/dL (ref 6.5–8.1)

## 2020-07-14 LAB — CBC WITH DIFFERENTIAL/PLATELET
Abs Immature Granulocytes: 0.12 10*3/uL — ABNORMAL HIGH (ref 0.00–0.07)
Basophils Absolute: 0 10*3/uL (ref 0.0–0.1)
Basophils Relative: 0 %
Eosinophils Absolute: 0 10*3/uL (ref 0.0–0.5)
Eosinophils Relative: 0 %
HCT: 34.5 % — ABNORMAL LOW (ref 36.0–46.0)
Hemoglobin: 11 g/dL — ABNORMAL LOW (ref 12.0–15.0)
Immature Granulocytes: 2 %
Lymphocytes Relative: 4 %
Lymphs Abs: 0.3 10*3/uL — ABNORMAL LOW (ref 0.7–4.0)
MCH: 27.7 pg (ref 26.0–34.0)
MCHC: 31.9 g/dL (ref 30.0–36.0)
MCV: 86.9 fL (ref 80.0–100.0)
Monocytes Absolute: 0.3 10*3/uL (ref 0.1–1.0)
Monocytes Relative: 5 %
Neutro Abs: 6.5 10*3/uL (ref 1.7–7.7)
Neutrophils Relative %: 89 %
Platelets: 232 10*3/uL (ref 150–400)
RBC: 3.97 MIL/uL (ref 3.87–5.11)
RDW: 25.2 % — ABNORMAL HIGH (ref 11.5–15.5)
WBC: 7.3 10*3/uL (ref 4.0–10.5)
nRBC: 0.7 % — ABNORMAL HIGH (ref 0.0–0.2)

## 2020-07-14 MED ORDER — ENOXAPARIN SODIUM 40 MG/0.4ML ~~LOC~~ SOLN
40.0000 mg | SUBCUTANEOUS | Status: DC
  Administered 2020-07-14: 40 mg via SUBCUTANEOUS
  Filled 2020-07-14: qty 0.4

## 2020-07-14 MED ORDER — SODIUM CHLORIDE 0.9 % IV SOLN
Freq: Once | INTRAVENOUS | Status: AC
  Administered 2020-07-14: 18 mg via INTRAVENOUS
  Filled 2020-07-14: qty 4

## 2020-07-14 MED ORDER — VINCRISTINE SULFATE CHEMO INJECTION 1 MG/ML
Freq: Once | INTRAVENOUS | Status: AC
  Filled 2020-07-14: qty 8

## 2020-07-14 NOTE — Progress Notes (Addendum)
HEMATOLOGY-ONCOLOGY PROGRESS NOTE  SUBJECTIVE:   Patient has tolerated cycle 5-day 2 of EPOCH-R chemotherapy well.  Labs are stable.  No shortness of breath.  No nausea vomiting or diarrhea. No mouth soreness. Good appetite and p.o. intake.  Received intrathecal methotrexate yesterday and tolerated it well overall.  She did develop some back spasms last evening and used a heating pad which was effective.  She has not had any headaches or dizziness.   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[place])  04/12/2020 Initial Diagnosis   Diffuse large B-cell lymphoma of lymph nodes of multiple regions ([place])   04/18/2020 -  Chemotherapy   The patient had dexamethasone (DECADRON) 4 MG tablet, 8 mg, Oral, 2 times daily with meals, 1 of 1 cycle, Start date: --, End date: -- pegfilgrastim-cbqv (UDENYCA) injection 6 mg, 6 mg, Subcutaneous, Once, 1 of 1 cycle Administration: 6 mg (06/27/2020) DOXOrubicin (ADRIAMYCIN) 16 mg, etoposide (VEPESID) 80 mg, vinCRIStine (ONCOVIN) 0.6 mg in sodium chloride 0.9 % 1,000 mL chemo infusion, , Intravenous, Once, 5 of 6 cycles Administration:  (04/18/2020),  (04/19/2020),  (05/10/2020),  (05/11/2020),  (04/20/2020),  (04/21/2020),  (05/12/2020),  (05/13/2020),  (05/30/2020),  (05/31/2020),  (07/12/2020),  (07/13/2020),  (06/01/2020),  (06/02/2020),  (06/20/2020),  (06/21/2020),  (06/22/2020),  (06/23/2020) ondansetron (ZOFRAN) 8 mg, dexamethasone (DECADRON) 10 mg in sodium chloride 0.9 % 50 mL IVPB, , Intravenous,  Once, 5 of 6 cycles Administration: 8 mg (04/18/2020), 18 mg (04/19/2020), 36 mg (04/22/2020), 18 mg (05/10/2020), 15 mg (05/11/2020), 8 mg (05/14/2020), 8 mg (04/20/2020), 18 mg (04/21/2020), 18 mg (05/12/2020), 18 mg (05/13/2020), 18 mg (05/30/2020), 14 mg (05/31/2020), 36 mg (06/03/2020), 18 mg (07/12/2020), 18 mg (07/13/2020), 68 mg (06/01/2020), 18 mg (06/02/2020), 18 mg (06/20/2020), 18 mg (06/21/2020), 18 mg (06/22/2020), 36 mg (06/24/2020), 8 mg (06/23/2020) methotrexate  (PF) 12 mg, hydrocortisone sodium succinate (SOLU-CORTEF) 50 mg in sodium chloride (PF) 0.9 % INTRATHECAL chemo injection, , Intrathecal,  Once, 3 of 4 cycles Administration:  (05/31/2020),  (06/21/2020),  (07/13/2020) cyclophosphamide (CYTOXAN) 1,180 mg in sodium chloride 0.9 % 250 mL chemo infusion, 750 mg/m2 = 1,180 mg, Intravenous,  Once, 5 of 6 cycles Administration: 1,180 mg (04/22/2020), 1,180 mg (05/14/2020), 1,180 mg (06/03/2020), 1,180 mg (06/24/2020)  for chemotherapy treatment.    04/25/2020 -  Chemotherapy   The patient had pegfilgrastim-cbqv (UDENYCA) injection 6 mg, 6 mg, Subcutaneous, Once, 3 of 5 cycles Administration: 6 mg (04/25/2020), 6 mg (05/16/2020), 6 mg (06/06/2020) riTUXimab-pvvr (RUXIENCE) 600 mg in sodium chloride 0.9 % 250 mL (1.9355 mg/mL) infusion, 375 mg/m2 = 600 mg, Intravenous,  Once, 1 of 1 cycle Administration: 600 mg (04/25/2020)  for chemotherapy treatment.       REVIEW OF SYSTEMS:   10 Point review of Systems was done is negative except as noted above.   I have reviewed the past medical history, past surgical history, social history and family history with the patient and they are unchanged from previous note.   PHYSICAL EXAMINATION: ECOG PERFORMANCE STATUS: 1 - Symptomatic but completely ambulatory  Vitals:   07/13/20 2052 07/14/20 0535  BP: (!) 106/53 (!) 147/89  Pulse: 100 65  Resp: 20 20  Temp: 97.7 F (36.5 C) 97.8 F (36.6 C)  SpO2: 98% 100%   Filed Weights   07/12/20 1127  Weight: 64 kg    Intake/Output from previous day: 09/08 0701 - 09/09 0700 In: 1690.1 [P.O.:240; I.V.:448.5; IV Piggyback:1001.6] Out: -   GENERAL:alert, in no acute  distress and comfortable SKIN: no acute rashes, no significant lesions EYES: conjunctiva are pink and non-injected, sclera anicteric OROPHARYNX: MMM, no exudates, no oropharyngeal erythema or ulceration NECK: supple, no JVD LYMPH:  no palpable lymphadenopathy in the cervical, axillary or inguinal  regions LUNGS: Clear to auscultation bilaterally. HEART: regular rate & rhythm ABDOMEN:  normoactive bowel sounds , non tender, not distended. Extremity: no pedal edema PSYCH: alert & oriented x 3 with fluent speech NEURO: no focal motor/sensory deficits   LABORATORY DATA:  I have reviewed the data as listed CMP Latest Ref Rng & Units 07/14/2020 07/13/2020 07/12/2020  Glucose 70 - 99 mg/dL 253(H) 277(H) 120(H)  BUN 6 - 20 mg/dL 14 14 15   Creatinine 0.44 - 1.00 mg/dL 0.55 0.54 0.50  Sodium 135 - 145 mmol/L 137 137 139  Potassium 3.5 - 5.1 mmol/L 3.8 4.3 3.7  Chloride 98 - 111 mmol/L 104 106 106  CO2 22 - 32 mmol/L 26 24 25   Calcium 8.9 - 10.3 mg/dL 9.6 9.8 8.7(L)  Total Protein 6.5 - 8.1 g/dL 6.7 6.8 6.2(L)  Total Bilirubin 0.3 - 1.2 mg/dL 0.4 0.3 0.4  Alkaline Phos 38 - 126 U/L 75 69 64  AST 15 - 41 U/L 17 25 21   ALT 0 - 44 U/L 21 24 20     Lab Results  Component Value Date   WBC 7.3 07/14/2020   HGB 11.0 (L) 07/14/2020   HCT 34.5 (L) 07/14/2020   MCV 86.9 07/14/2020   PLT 232 07/14/2020   NEUTROABS 6.5 07/14/2020    DG FLUORO GUIDE LUMBAR PUNCTURE  Result Date: 07/13/2020 CLINICAL DATA:  44 year old female with history of large B-cell lymphoma. EXAM: DIAGNOSTIC LUMBAR PUNCTURE UNDER FLUOROSCOPIC GUIDANCE FLUOROSCOPY TIME:  Fluoroscopy Time:  41 seconds PROCEDURE: Informed consent was obtained from the patient prior to the procedure, including potential complications of headache, allergy, and pain. With the patient prone, the lower back was prepped with Betadine. 1% Lidocaine was used for local anesthesia. Lumbar puncture was performed at the L2-L3 level using a 20 gauge needle with return of clear CSF. 12 mg of methotrexate was injected intrathecally. The patient tolerated the procedure well and there were no apparent complications. IMPRESSION: 1. Successful uncomplicated fluoroscopic guided lumbar puncture for intrathecal chemotherapy injection, as above. Electronically Signed   By:  Vinnie Langton M.D.   On: 07/13/2020 15:20   DG FLUORO GUIDE LUMBAR PUNCTURE  Result Date: 06/21/2020 CLINICAL DATA:  Diffuse large B-cell lymphoma. EXAM: FLUOROSCOPICALLY GUIDED LUMBAR PUNCTURE FOR INTRATHECAL CHEMOTHERAPY FLUOROSCOPY TIME:  1 minutes 0 seconds PROCEDURE: Informed consent was obtained from the patient prior to the procedure, including potential complications of headache, allergy, and pain. Safety time-out was performed. With the patient prone, the lower back was prepped with Betadine. 1% Lidocaine was used for local anesthesia. Lumbar puncture was performed at the L3-4 level using a 20 gauge needle with return of bloody CSF. 5 mL of Omnipaque 180 contrast was injected to confirm intrathecal position of the needle. 5 mL of methotrexate chemotherapy obtained from the hospital pharmacy was injected into the subarachnoid space. The needle was subsequently removed, and a sterile bandage was applied at the skin puncture site. The patient tolerated the procedure well without apparent complication. IMPRESSION: Successful lumbar puncture and intrathecal chemotherapy administration under fluoroscopic guidance. No evidence of immediate complication. Electronically Signed   By: Marlaine Hind M.D.   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 Hemoglobin C], childhood malaria. Patient notes that she had a CT of the abdomen sometime in 2020 that showed no acute pathology.This was not [facility]. Not accessible to [place] at this point. She did have an MRI of the abdomen in July 2019 which showed indeterminate splenic lesions.No other acute abdominal pathology.No concern with hepatocellular  carcinoma.  Patient is presenting now with  #1Pancreatic massalong with retroperitoneal lymphadenopathy and diffuse splenic lesions.due to diffuse large b cell lymphoma No internal necrosis within the mass noted. Diffuse splenomegaly. Left periaortic lymph node causing mild displacement of the third portion of the duodenum.  Significantly elevated LDH levels. 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  PET scan on 06/11/2020 following 3 cycles of chemotherapy showed "1. Marked reduction in size and metabolic activity of splenic mass. Residual mass has mild metabolic activity slightly greater than liver activity ( [place]au[place] 4). 2. No new metastatic disease. 3. Diffuse marrow activity related to GCSF type response."  # 2 Left sided large pleural effusion and lung atelectasis. S/p diagnostic and therapeutic thoracentesis -- lymphocytic predominance @ 80%  HIV could be risk factors for high grade EBV driven lymphomas  Patient symptomatology has developed rather quickly over the last few weeks.  #17microcytic anemia-chronic.Some element of iron deficiency versus hemoglobinopathy versus anemia of chronic disease related to malignancy. Patient has received 1 dose of IV Feraheme on 04/08/2020 and repeated on 04/22/2020  #4history of HIV/AIDS follows with Dr. Melynda Keller and at [facility].   PLAN: -Labs from today been reviewed and are adequate to continue with treatment.  She will proceed with cycle 5-day 3 of chemotherapy as scheduled. -Check daily CBC with differential and CMET -Continue as needed antiemetics. -Scheduled MiraLAX and PRN Senokot-S have been ordered. -Will need to be cautious with rituximab to prevent recurrent hepatitis B flare.  Repeat HBV DNA undetectable on 06/02/2020. -continue with Biktarvy - no interactions - this was confirmed with patient ID physician and our  pharmacy. -Continue Acyclovir prophylaxis.  -Anticipate hospital discharge on 07/16/2020.  The patient is scheduled the cancer center on 07/18/2020 for Rituxan and Neulasta and for labs and follow-up visit on 07/26/2020.   Mikey Bussing, DNP, AGPCNP-BC, AOCNP   ADDENDUM  .Patient was Personally and independently interviewed, examined and relevant elements of the history of present illness were reviewed in details and an assessment and plan was created. All elements of the patient's history of present illness , assessment and plan were discussed in details with Mikey Bussing, DNP, AGPCNP-BC, AOCNP. The above documentation reflects our combined findings assessment and plan.  Sullivan Lone MD MS

## 2020-07-14 NOTE — Progress Notes (Addendum)
Marland Kitchen   HEMATOLOGY/ONCOLOGY INPATIENT PROGRESS NOTE  Date of Service: 07/14/2020  Inpatient Attending: .Brunetta Genera, Heather [name] was seen in follow-up for her cycle 5-day 2 of EPOCH-R chemotherapy for large B-cell lymphoma.  She is tolerating her IV chemotherapy well and did receive her third dose of intrathecal methotrexate for CNS prophylaxis today by IR.  She tolerated this well with no significant back pain nausea or vomiting and a very minimal 1 x 10 headache which is nearly resolved.  No other acute new concerns. Did note some pain in her fingers when she slept on her wrist likely suggesting some element of carpal tunnel.  This has now resolved. In good spirits overall. No other acute new concerns.  OBJECTIVE:  NAD  PHYSICAL EXAMINATION: . Vitals:   07/13/20 0535 07/13/20 1343 07/13/20 2052 07/14/20 0535  BP: 123/85 123/81 (!) 106/53 (!) 147/89  Pulse: 87 62 100 65  Resp: 20 20 20 20   Temp: 97.8 F (36.6 C) 97.9 F (36.6 C) 97.7 F (36.5 C) 97.8 F (36.6 C)  TempSrc: Oral Oral Oral Oral  SpO2: 98% 99% 98% 100%  Weight:      Height:       Filed Weights   07/12/20 1127  Weight: 141 lb (64 kg)   .Body mass index is 27.54 kg/m.  Marland Kitchen GENERAL:alert, in no acute distress and comfortable SKIN: no acute rashes, no significant lesions EYES: conjunctiva are pink and non-injected, sclera anicteric OROPHARYNX: MMM, no exudates, no oropharyngeal erythema or ulceration NECK: supple, no JVD LYMPH:  no palpable lymphadenopathy in the cervical, axillary or inguinal regions LUNGS: clear to auscultation b/l with normal respiratory effort HEART: regular rate & rhythm ABDOMEN:  normoactive bowel sounds , non tender, not distended. Extremity: no pedal edema PSYCH: alert & oriented x 3 with fluent speech NEURO: no focal motor/sensory deficits    MEDICAL HISTORY:  Past Medical History:  Diagnosis Date  . Anemia   . Diffuse large B-cell lymphoma of lymph nodes  of multiple regions ([place]) 04/12/2020  . 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 History of blood transfusion    childhood  . HIV (human immunodeficiency virus infection) ([place])   . Immune deficiency disorder [facility])     SURGICAL HISTORY: Past Surgical History:  Procedure Laterality Date  . CHROMOPERTUBATION Bilateral 11/29/2016   Procedure: CHROMOPERTUBATION;  Surgeon: Waymon Amato, MD;  Location: [facility];  Service: Gynecology;  Laterality: Bilateral;  fallopian tubes  . ENDOMETRIAL BIOPSY    . IR IMAGING GUIDED PORT INSERTION  04/15/2020  . IR THORACENTESIS ASP PLEURAL SPACE W/IMG GUIDE  04/15/2020  . MYOMECTOMY N/A 11/29/2016   Procedure: MYOMECTOMY;  Surgeon: Waymon Amato, MD;  Location: [facility];  Service: Gynecology;  Laterality: N/A;    SOCIAL HISTORY: Social History   Socioeconomic History  . Marital status: Married    Spouse name: Not on file  . Number of children: Not on file  . Years of education: Not on file  . Highest education level: Not on file  Occupational History  . Not on file  Tobacco Use  . Smoking status: Never Smoker  . Smokeless tobacco: Never Used  Substance and Sexual Activity  . Alcohol use: Yes    Comment: occ  . Drug use: No  . Sexual activity: Yes  Other Topics Concern  . Not on file  Social History Narrative  . Not on file   Social Determinants of Health  Financial Resource Strain:   . Difficulty of Paying Living Expenses: Not on file  Food Insecurity:   . Worried About Charity fundraiser in the Last Year: Not on file  . Ran Out of Food in the Last Year: Not on file  Transportation Needs:   . Lack of Transportation (Medical): Not on file  . Lack of Transportation (Non-Medical): Not on file  Physical Activity:   . Days of Exercise per Week: Not on file  . Minutes of Exercise per Session: Not on file  Stress:   . Feeling of Stress : Not on file  Social Connections:   . Frequency of Communication with Friends and Family: Not on file  .  Frequency of Social Gatherings with Friends and Family: Not on file  . Attends Religious Services: Not on file  . Active Member of Clubs or Organizations: Not on file  . Attends Archivist Meetings: Not on file  . Marital Status: Not on file  Intimate Partner Violence:   . Fear of Current or Ex-Partner: Not on file  . Emotionally Abused: Not on file  . Physically Abused: Not on file  . Sexually Abused: Not on file    FAMILY HISTORY: Family History  Problem Relation Age of Onset  . Diabetes Mother   . Hypertension Mother     ALLERGIES:  has No Known Allergies.  MEDICATIONS:  Scheduled Meds: . acyclovir  400 mg Oral BID  . B-complex with vitamin C  1 tablet Oral Daily  . bictegravir-emtricitabine-tenofovir AF  1 tablet Oral Q breakfast  . Chlorhexidine Gluconate Cloth  6 each Topical Daily  . cholecalciferol  1,000 Units Oral QHS  . DOXOrubicin/vinCRIStine/etoposide CHEMO IV infusion for Inpatient CI   Intravenous Once  . DOXOrubicin/vinCRIStine/etoposide CHEMO IV infusion for Inpatient CI   Intravenous Once  . feeding supplement (ENSURE ENLIVE)  237 mL Oral BID BM  . multivitamin with minerals  1 tablet Oral QHS  . polyethylene glycol  17 g Oral Daily  . potassium chloride  20 mEq Oral Daily  . predniSONE  60 mg Oral QAC breakfast  . sodium bicarbonate/sodium chloride  1 application Mouth Rinse QID  . sodium chloride flush  10-40 mL Intracatheter Q12H   Continuous Infusions: . sodium chloride 20 mL/hr at 07/14/20 0400  . ondansetron (ZOFRAN) with dexamethasone (DECADRON) IV     PRN Meds:.acetaminophen, Cold Pack, Hot Pack, HYDROcodone-acetaminophen, ibuprofen, lidocaine-prilocaine, LORazepam, morphine injection, ondansetron, prochlorperazine, senna-docusate, sodium chloride flush  REVIEW OF SYSTEMS:    10 Point review of Systems was done is negative except as noted above.   LABORATORY DATA:  I have reviewed the data as listed  . CBC Latest Ref Rng & Units  07/13/2020 07/12/2020  WBC 4.0 - 10.5 K/uL 6.1 5.4  Hemoglobin 12.0 - 15.0 g/dL 10.9(L) 10.0(L)  Hematocrit 36 - 46 % 34.7(L) 32.1(L)  Platelets 150 - 400 K/uL 205 228    . CMP Latest Ref Rng & Units 07/13/2020 07/12/2020  Glucose 70 - 99 mg/dL 277(H) 120(H)  BUN 6 - 20 mg/dL 14 15  Creatinine 0.44 - 1.00 mg/dL 0.54 0.50  Sodium 135 - 145 mmol/L 137 139  Potassium 3.5 - 5.1 mmol/L 4.3 3.7  Chloride 98 - 111 mmol/L 106 106  CO2 22 - 32 mmol/L 24 25  Calcium 8.9 - 10.3 mg/dL 9.8 8.7(L)  Total Protein 6.5 - 8.1 g/dL 6.8 6.2(L)  Total Bilirubin 0.3 - 1.2 mg/dL 0.3 0.4  Alkaline Phos 38 -  126 U/L 69 64  AST 15 - 41 U/L 25 21  ALT 0 - 44 U/L 24 20     RADIOGRAPHIC STUDIES: I have personally reviewed the radiological images as listed and agreed with the findings in the report. DG FLUORO GUIDE LUMBAR PUNCTURE  Result Date: 07/13/2020 CLINICAL DATA:  44 year old female with history of large B-cell lymphoma. EXAM: DIAGNOSTIC LUMBAR PUNCTURE UNDER FLUOROSCOPIC GUIDANCE FLUOROSCOPY TIME:  Fluoroscopy Time:  41 seconds PROCEDURE: Informed consent was obtained from the patient prior to the procedure, including potential complications of headache, allergy, and pain. With the patient prone, the lower back was prepped with Betadine. 1% Lidocaine was used for local anesthesia. Lumbar puncture was performed at the L2-L3 level using a 20 gauge needle with return of clear CSF. 12 mg of methotrexate was injected intrathecally. The patient tolerated the procedure well and there were no apparent complications. IMPRESSION: 1. Successful uncomplicated fluoroscopic guided lumbar puncture for intrathecal chemotherapy injection, as above. Electronically Signed   By: Vinnie Langton M.D.   On: 07/13/2020 15:20   DG FLUORO GUIDE LUMBAR PUNCTURE  Result Date: 06/21/2020 CLINICAL DATA:  Diffuse large B-cell lymphoma. EXAM: FLUOROSCOPICALLY GUIDED LUMBAR PUNCTURE FOR INTRATHECAL CHEMOTHERAPY FLUOROSCOPY TIME:  1 minutes 0  seconds PROCEDURE: Informed consent was obtained from the patient prior to the procedure, including potential complications of headache, allergy, and pain. Safety time-out was performed. With the patient prone, the lower back was prepped with Betadine. 1% Lidocaine was used for local anesthesia. Lumbar puncture was performed at the L3-4 level using a 20 gauge needle with return of bloody CSF. 5 mL of Omnipaque 180 contrast was injected to confirm intrathecal position of the needle. 5 mL of methotrexate chemotherapy obtained from the hospital pharmacy was injected into the subarachnoid space. The needle was subsequently removed, and a sterile bandage was applied at the skin puncture site. The patient tolerated the procedure well without apparent complication. IMPRESSION: Successful lumbar puncture and intrathecal chemotherapy administration under fluoroscopic guidance. No evidence of immediate complication. Electronically Signed   By: Marlaine Hind M.D.   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 Hemoglobin C], childhood malaria. Patient notes that she had a CT of the abdomen sometime in 2020 that showed no acute pathology.This was not [facility]. Not accessible to [place] at this point. She did have an MRI of the abdomen in July 2019 which showed indeterminate splenic lesions.No other acute abdominal pathology.No concern with hepatocellular carcinoma.  Patient is presenting now with  #1Pancreatic massalong with retroperitoneal lymphadenopathy and diffuse splenic lesions.due to diffuse large b cell lymphoma No internal necrosis within the mass noted. Diffuse splenomegaly. Left periaortic lymph node causing  mild displacement of the third portion of the duodenum.  Significantly elevated LDH levels. 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  PET scan on 06/11/2020 following 3 cycles of chemotherapy showed "1. Marked reduction in size and metabolic activity of splenic mass. Residual mass has mild metabolic activity slightly greater than liver activity ( Deauville 4). 2. No new metastatic disease. 3. Diffuse marrow activity related to GCSF type response."  # 2 Left sided large pleural effusion and lung atelectasis. S/p diagnostic and therapeutic thoracentesis --  lymphocytic predominance @ 80%  HIV could be risk factors for high grade EBV driven lymphomas  Patient symptomatology has developed rather quickly over the last few weeks.  #95microcytic anemia-chronic.Some element of iron deficiency versus hemoglobinopathy versus anemia of chronic disease related to malignancy. Patient has received 1 dose of IV Feraheme on 04/08/2020 and repeated on 04/22/2020  #4history of HIV/AIDS follows with Dr. Melynda Keller and at [facility].   PLAN: -CBC with differential and CMET from today and are stable. -No prohibitive toxicities from her IV chemotherapy and intrathecal methotrexate at this time - She will continue with her planned cycle 5 of chemotherapy. -Check daily CBC with differential and CMET -Continue as needed antiemetics. -Scheduled MiraLAX and PRN Senokot-S have been ordered. -Will need to be cautious with rituximab to prevent recurrent hepatitis B flare.  Repeat HBV DNA undetectable on 06/02/2020. -continue with Biktarvy - no interactions - this was confirmed with patient ID physician and our pharmacy. -Continue Acyclovir prophylaxis.  -We will plan for intrathecal methotrexate on day 2 of this cycle of chemotherapy completed.  This was dosed 3 out of a total of 4 planned intrathecal methotrexate  for CNS prophylaxis.  The patient is scheduled the cancer center on 07/18/2020 for Rituxan and Neulasta. Will schedule follow-up visit (scheduling message sent).    I spent 15 minutes counseling the patient face to face. The total time spent in the appointment was 25 minutes and more than 50% was on counseling and direct patient cares.    Sullivan Lone MD [place] AAHIVMS [facility] Hematology/Oncology Physician [facility]  (Office):       [phone] (Work cell):  [phone] (Fax):           [phone]

## 2020-07-15 ENCOUNTER — Inpatient Hospital Stay (HOSPITAL_COMMUNITY): Payer: 59

## 2020-07-15 DIAGNOSIS — D649 Anemia, unspecified: Secondary | ICD-10-CM

## 2020-07-15 DIAGNOSIS — B1910 Unspecified viral hepatitis B without hepatic coma: Secondary | ICD-10-CM

## 2020-07-15 DIAGNOSIS — B20 Human immunodeficiency virus [HIV] disease: Secondary | ICD-10-CM

## 2020-07-15 DIAGNOSIS — C8588 Other specified types of non-Hodgkin lymphoma, lymph nodes of multiple sites: Secondary | ICD-10-CM

## 2020-07-15 DIAGNOSIS — R109 Unspecified abdominal pain: Secondary | ICD-10-CM

## 2020-07-15 LAB — CBC WITH DIFFERENTIAL/PLATELET
Abs Immature Granulocytes: 0.08 10*3/uL — ABNORMAL HIGH (ref 0.00–0.07)
Basophils Absolute: 0 10*3/uL (ref 0.0–0.1)
Basophils Relative: 0 %
Eosinophils Absolute: 0 10*3/uL (ref 0.0–0.5)
Eosinophils Relative: 0 %
HCT: 34.7 % — ABNORMAL LOW (ref 36.0–46.0)
Hemoglobin: 11.1 g/dL — ABNORMAL LOW (ref 12.0–15.0)
Immature Granulocytes: 2 %
Lymphocytes Relative: 7 %
Lymphs Abs: 0.3 10*3/uL — ABNORMAL LOW (ref 0.7–4.0)
MCH: 27.7 pg (ref 26.0–34.0)
MCHC: 32 g/dL (ref 30.0–36.0)
MCV: 86.5 fL (ref 80.0–100.0)
Monocytes Absolute: 0.2 10*3/uL (ref 0.1–1.0)
Monocytes Relative: 7 %
Neutro Abs: 3.1 10*3/uL (ref 1.7–7.7)
Neutrophils Relative %: 84 %
Platelets: 245 10*3/uL (ref 150–400)
RBC: 4.01 MIL/uL (ref 3.87–5.11)
RDW: 24.2 % — ABNORMAL HIGH (ref 11.5–15.5)
WBC: 3.6 10*3/uL — ABNORMAL LOW (ref 4.0–10.5)
nRBC: 0.8 % — ABNORMAL HIGH (ref 0.0–0.2)

## 2020-07-15 LAB — COMPREHENSIVE METABOLIC PANEL
ALT: 23 U/L (ref 0–44)
AST: 22 U/L (ref 15–41)
Albumin: 4 g/dL (ref 3.5–5.0)
Alkaline Phosphatase: 68 U/L (ref 38–126)
Anion gap: 13 (ref 5–15)
BUN: 15 mg/dL (ref 6–20)
CO2: 26 mmol/L (ref 22–32)
Calcium: 10.1 mg/dL (ref 8.9–10.3)
Chloride: 100 mmol/L (ref 98–111)
Creatinine, Ser: 0.63 mg/dL (ref 0.44–1.00)
GFR calc Af Amer: >60 mL/min (ref >60)
GFR calc non Af Amer: >60 mL/min (ref >60)
Glucose, Bld: 303 mg/dL — ABNORMAL HIGH (ref 70–99)
Potassium: 3.7 mmol/L (ref 3.5–5.1)
Sodium: 139 mmol/L (ref 135–145)
Total Bilirubin: 0.4 mg/dL (ref 0.3–1.2)
Total Protein: 6.9 g/dL (ref 6.5–8.1)

## 2020-07-15 LAB — GLUCOSE, CAPILLARY
Glucose-Capillary: 252 mg/dL — ABNORMAL HIGH (ref 70–99)
Glucose-Capillary: 417 mg/dL — ABNORMAL HIGH (ref 70–99)
Glucose-Capillary: 413 mg/dL — ABNORMAL HIGH (ref 70–99)

## 2020-07-15 LAB — GLUCOSE, RANDOM: Glucose, Bld: 433 mg/dL — ABNORMAL HIGH (ref 70–99)

## 2020-07-15 MED ORDER — SODIUM CHLORIDE 0.9 % IV SOLN
750.0000 mg/m2 | Freq: Once | INTRAVENOUS | Status: AC
  Administered 2020-07-16: 1180 mg via INTRAVENOUS
  Filled 2020-07-15: qty 59

## 2020-07-15 MED ORDER — NYSTATIN 100000 UNIT/ML MT SUSP
5.0000 mL | Freq: Four times a day (QID) | OROMUCOSAL | Status: DC
  Administered 2020-07-15 – 2020-07-16 (×4): 500000 [IU] via ORAL
  Filled 2020-07-15 (×4): qty 5

## 2020-07-15 MED ORDER — VINCRISTINE SULFATE CHEMO INJECTION 1 MG/ML
Freq: Once | INTRAVENOUS | Status: AC
  Filled 2020-07-15: qty 8

## 2020-07-15 MED ORDER — INSULIN ASPART 100 UNIT/ML ~~LOC~~ SOLN
0.0000 [IU] | Freq: Three times a day (TID) | SUBCUTANEOUS | Status: DC
  Administered 2020-07-15: 8 [IU] via SUBCUTANEOUS
  Administered 2020-07-15: 5 [IU] via SUBCUTANEOUS
  Administered 2020-07-16: 2 [IU] via SUBCUTANEOUS
  Administered 2020-07-16: 1 [IU] via SUBCUTANEOUS

## 2020-07-15 MED ORDER — SODIUM CHLORIDE 0.9 % IV SOLN
Freq: Once | INTRAVENOUS | Status: AC
  Administered 2020-07-15: 18 mg via INTRAVENOUS
  Filled 2020-07-15: qty 4

## 2020-07-15 MED ORDER — SODIUM CHLORIDE 0.9 % IV SOLN
Freq: Once | INTRAVENOUS | Status: AC
  Administered 2020-07-16: 16 mg via INTRAVENOUS
  Filled 2020-07-15: qty 8

## 2020-07-15 MED ORDER — NYSTATIN 100000 UNIT/ML MT SUSP: 5.0000 mL | Freq: Four times a day (QID) | OROMUCOSAL | 0 refills | Status: AC

## 2020-07-15 MED ORDER — INSULIN ASPART 100 UNIT/ML ~~LOC~~ SOLN
6.0000 [IU] | Freq: Once | SUBCUTANEOUS | Status: AC
  Administered 2020-07-15: 6 [IU] via SUBCUTANEOUS

## 2020-07-15 MED ORDER — INSULIN ASPART 100 UNIT/ML ~~LOC~~ SOLN: 0.0000 [IU] | Freq: Every day | SUBCUTANEOUS | Status: DC

## 2020-07-15 NOTE — Progress Notes (Addendum)
HEMATOLOGY-ONCOLOGY PROGRESS NOTE  SUBJECTIVE:   Patient has tolerated cycle 5-day 3 of EPOCH-R chemotherapy well.  No shortness of breath.  No nausea vomiting or diarrhea. States throat is becoming a little sore. Good appetite and p.o. intake.  Glucose noted to be >300 this am. States she has been drinking a lot of water.    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[place])  04/12/2020 Initial Diagnosis   Diffuse large B-cell lymphoma of lymph nodes of multiple regions ([place])   04/18/2020 -  Chemotherapy   The patient had dexamethasone (DECADRON) 4 MG tablet, 8 mg, Oral, 2 times daily with meals, 1 of 1 cycle, Start date: --, End date: -- pegfilgrastim-cbqv (UDENYCA) injection 6 mg, 6 mg, Subcutaneous, Once, 1 of 1 cycle Administration: 6 mg (06/27/2020) DOXOrubicin (ADRIAMYCIN) 16 mg, etoposide (VEPESID) 80 mg, vinCRIStine (ONCOVIN) 0.6 mg in sodium chloride 0.9 % 1,000 mL chemo infusion, , Intravenous, Once, 5 of 6 cycles Administration:  (04/18/2020),  (04/19/2020),  (05/10/2020),  (05/11/2020),  (04/20/2020),  (04/21/2020),  (05/12/2020),  (05/13/2020),  (05/30/2020),  (05/31/2020),  (07/12/2020),  (07/13/2020),  (07/14/2020),  (06/01/2020),  (06/02/2020),  (06/20/2020),  (06/21/2020),  (06/22/2020),  (06/23/2020) ondansetron (ZOFRAN) 8 mg, dexamethasone (DECADRON) 10 mg in sodium chloride 0.9 % 50 mL IVPB, , Intravenous,  Once, 5 of 6 cycles Administration: 8 mg (04/18/2020), 18 mg (04/19/2020), 36 mg (04/22/2020), 18 mg (05/10/2020), 15 mg (05/11/2020), 8 mg (05/14/2020), 8 mg (04/20/2020), 18 mg (04/21/2020), 18 mg (05/12/2020), 18 mg (05/13/2020), 18 mg (05/30/2020), 14 mg (05/31/2020), 36 mg (06/03/2020), 18 mg (07/12/2020), 18 mg (07/13/2020), 18 mg (07/14/2020), 68 mg (06/01/2020), 18 mg (06/02/2020), 18 mg (06/20/2020), 18 mg (06/21/2020), 18 mg (06/22/2020), 36 mg (06/24/2020), 8 mg (06/23/2020) methotrexate (PF) 12 mg, hydrocortisone sodium succinate (SOLU-CORTEF) 50 mg in sodium chloride (PF) 0.9 %  INTRATHECAL chemo injection, , Intrathecal,  Once, 3 of 4 cycles Administration:  (05/31/2020),  (06/21/2020),  (07/13/2020) cyclophosphamide (CYTOXAN) 1,180 mg in sodium chloride 0.9 % 250 mL chemo infusion, 750 mg/m2 = 1,180 mg, Intravenous,  Once, 5 of 6 cycles Administration: 1,180 mg (04/22/2020), 1,180 mg (05/14/2020), 1,180 mg (06/03/2020), 1,180 mg (06/24/2020)  for chemotherapy treatment.    04/25/2020 -  Chemotherapy   The patient had pegfilgrastim-cbqv (UDENYCA) injection 6 mg, 6 mg, Subcutaneous, Once, 3 of 5 cycles Administration: 6 mg (04/25/2020), 6 mg (05/16/2020), 6 mg (06/06/2020) riTUXimab-pvvr (RUXIENCE) 600 mg in sodium chloride 0.9 % 250 mL (1.9355 mg/mL) infusion, 375 mg/m2 = 600 mg, Intravenous,  Once, 1 of 1 cycle Administration: 600 mg (04/25/2020)  for chemotherapy treatment.       REVIEW OF SYSTEMS:   10 Point review of Systems was done is negative except as noted above.   I have reviewed the past medical history, past surgical history, social history and family history with the patient and they are unchanged from previous note.   PHYSICAL EXAMINATION: ECOG PERFORMANCE STATUS: 1 - Symptomatic but completely ambulatory  Vitals:   07/14/20 2117 07/15/20 0545  BP: 126/80 (!) 161/91  Pulse: 77 (!) 57  Resp: 20 20  Temp: (!) 97.5 F (36.4 C) 98.1 F (36.7 C)  SpO2: 99% 100%   Filed Weights   07/12/20 1127  Weight: 64 kg    Intake/Output from previous day: 09/09 0701 - 09/10 0700 In: 7262 [P.O.:600; I.V.:451.1; IV Piggyback:589.9] Out: 2 [Urine:2]  GENERAL:alert, in no acute distress and comfortable SKIN: no acute rashes, no significant lesions EYES: conjunctiva  are pink and non-injected, sclera anicteric OROPHARYNX: MMM, no exudates, no oropharyngeal erythema or ulceration NECK: supple, no JVD LYMPH:  no palpable lymphadenopathy in the cervical, axillary or inguinal regions LUNGS: Clear to auscultation bilaterally. HEART: regular rate & rhythm ABDOMEN:   normoactive bowel sounds , non tender, not distended. Extremity: no pedal edema PSYCH: alert & oriented x 3 with fluent speech NEURO: no focal motor/sensory deficits   LABORATORY DATA:  I have reviewed the data as listed CMP Latest Ref Rng & Units 07/15/2020 07/14/2020 07/13/2020  Glucose 70 - 99 mg/dL 303(H) 253(H) 277(H)  BUN 6 - 20 mg/dL 15 14 14   Creatinine 0.44 - 1.00 mg/dL 0.63 0.55 0.54  Sodium 135 - 145 mmol/L 139 137 137  Potassium 3.5 - 5.1 mmol/L 3.7 3.8 4.3  Chloride 98 - 111 mmol/L 100 104 106  CO2 22 - 32 mmol/L 26 26 24   Calcium 8.9 - 10.3 mg/dL 10.1 9.6 9.8  Total Protein 6.5 - 8.1 g/dL 6.9 6.7 6.8  Total Bilirubin 0.3 - 1.2 mg/dL 0.4 0.4 0.3  Alkaline Phos 38 - 126 U/L 68 75 69  AST 15 - 41 U/L 22 17 25   ALT 0 - 44 U/L 23 21 24     Lab Results  Component Value Date   WBC 3.6 (L) 07/15/2020   HGB 11.1 (L) 07/15/2020   HCT 34.7 (L) 07/15/2020   MCV 86.5 07/15/2020   PLT 245 07/15/2020   NEUTROABS 3.1 07/15/2020    DG FLUORO GUIDE LUMBAR PUNCTURE  Result Date: 07/13/2020 CLINICAL DATA:  44 year old female with history of large B-cell lymphoma. EXAM: DIAGNOSTIC LUMBAR PUNCTURE UNDER FLUOROSCOPIC GUIDANCE FLUOROSCOPY TIME:  Fluoroscopy Time:  41 seconds PROCEDURE: Informed consent was obtained from the patient prior to the procedure, including potential complications of headache, allergy, and pain. With the patient prone, the lower back was prepped with Betadine. 1% Lidocaine was used for local anesthesia. Lumbar puncture was performed at the L2-L3 level using a 20 gauge needle with return of clear CSF. 12 mg of methotrexate was injected intrathecally. The patient tolerated the procedure well and there were no apparent complications. IMPRESSION: 1. Successful uncomplicated fluoroscopic guided lumbar puncture for intrathecal chemotherapy injection, as above. Electronically Signed   By: Vinnie Langton M.D.   On: 07/13/2020 15:20   DG FLUORO GUIDE LUMBAR PUNCTURE  Result  Date: 06/21/2020 CLINICAL DATA:  Diffuse large B-cell lymphoma. EXAM: FLUOROSCOPICALLY GUIDED LUMBAR PUNCTURE FOR INTRATHECAL CHEMOTHERAPY FLUOROSCOPY TIME:  1 minutes 0 seconds PROCEDURE: Informed consent was obtained from the patient prior to the procedure, including potential complications of headache, allergy, and pain. Safety time-out was performed. With the patient prone, the lower back was prepped with Betadine. 1% Lidocaine was used for local anesthesia. Lumbar puncture was performed at the L3-4 level using a 20 gauge needle with return of bloody CSF. 5 mL of Omnipaque 180 contrast was injected to confirm intrathecal position of the needle. 5 mL of methotrexate chemotherapy obtained from the hospital pharmacy was injected into the subarachnoid space. The needle was subsequently removed, and a sterile bandage was applied at the skin puncture site. The patient tolerated the procedure well without apparent complication. IMPRESSION: Successful lumbar puncture and intrathecal chemotherapy administration under fluoroscopic guidance. No evidence of immediate complication. Electronically Signed   By: Marlaine Hind M.D.   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 Hemoglobin C], childhood malaria. Patient notes that she had a CT of the abdomen sometime in 2020 that showed no acute pathology.This was not [facility]. Not accessible to [place] at this point. She did have an MRI of the abdomen in July 2019 which showed indeterminate splenic lesions.No other acute abdominal pathology.No concern with hepatocellular carcinoma.  Patient is presenting now with  #1Pancreatic massalong with retroperitoneal lymphadenopathy  and diffuse splenic lesions.due to diffuse large b cell lymphoma No internal necrosis within the mass noted. Diffuse splenomegaly. Left periaortic lymph node causing mild displacement of the third portion of the duodenum.  Significantly elevated LDH levels. 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  PET scan on 06/11/2020 following 3 cycles of chemotherapy showed "1. Marked reduction in size and metabolic activity of splenic mass. Residual mass has mild metabolic activity slightly greater than liver activity ( Deauville 4). 2. No new metastatic disease. 3. Diffuse marrow activity related to GCSF type response."  # 2 Left sided large pleural effusion and lung atelectasis. S/p diagnostic and therapeutic thoracentesis -- lymphocytic predominance @ 80%  HIV could be risk factors for high grade EBV driven lymphomas  Patient symptomatology has developed rather quickly over the last few weeks.  #66microcytic anemia-chronic.Some element of iron deficiency versus hemoglobinopathy versus anemia of chronic disease related to malignancy. Patient has received 1 dose of IV Feraheme on 04/08/2020 and repeated on 04/22/2020  #4history of HIV/AIDS follows with Dr. Melynda Keller and at [facility].   PLAN: -Labs from today been reviewed and are adequate to continue with treatment.  She will proceed with cycle 5-day 4 of chemotherapy as scheduled. -Check daily CBC with differential and CMET -Continue as needed antiemetics. -Scheduled MiraLAX and PRN Senokot-S have been ordered. -Will need to be cautious with rituximab to prevent recurrent hepatitis B flare.  Repeat HBV DNA undetectable on 06/02/2020. -continue with Biktarvy - no interactions - this was confirmed with patient ID physician and our pharmacy. -Continue Acyclovir prophylaxis.  -Discussed hyperglycemia noted on labs this morning.  Hyperglycemia  likely due to steroids.  I anticipate that her blood sugars will correct when she is home and off the steroids.  Will check CBGs before meals and at bedtime and start her on sliding scale insulin. -She is developing a sore throat.  No thrush was noted on exam, but the patient is prone to getting thrush following chemotherapy.  We will start her on nystatin swish and swallow and continue as an outpatient. -Anticipate hospital discharge on 07/16/2020.  The patient is scheduled the cancer center on 07/18/2020 for Rituxan and Neulasta and for labs and follow-up visit on 07/26/2020.   Mikey Bussing, DNP, AGPCNP-BC, AOCNP   ADDENDUM  .Patient was Personally and independently interviewed, examined and relevant elements of the history of present illness were reviewed in details and an assessment and plan was created. All elements of the patient's history of present illness , assessment and plan were discussed in details with Mikey Bussing, DNP, AGPCNP-BC, AOCNP. The above documentation reflects our combined findings assessment and plan.  Sullivan Lone MD MS

## 2020-07-16 LAB — CBC WITH DIFFERENTIAL/PLATELET
Abs Immature Granulocytes: 0.16 10*3/uL — ABNORMAL HIGH (ref 0.00–0.07)
Basophils Absolute: 0 10*3/uL (ref 0.0–0.1)
Basophils Relative: 0 %
Eosinophils Absolute: 0 10*3/uL (ref 0.0–0.5)
Eosinophils Relative: 0 %
HCT: 33.6 % — ABNORMAL LOW (ref 36.0–46.0)
Hemoglobin: 11.1 g/dL — ABNORMAL LOW (ref 12.0–15.0)
Immature Granulocytes: 6 %
Lymphocytes Relative: 12 %
Lymphs Abs: 0.3 10*3/uL — ABNORMAL LOW (ref 0.7–4.0)
MCH: 27.8 pg (ref 26.0–34.0)
MCHC: 33 g/dL (ref 30.0–36.0)
MCV: 84.2 fL (ref 80.0–100.0)
Monocytes Absolute: 0.1 10*3/uL (ref 0.1–1.0)
Monocytes Relative: 3 %
Neutro Abs: 2.3 10*3/uL (ref 1.7–7.7)
Neutrophils Relative %: 79 %
Platelets: 228 10*3/uL (ref 150–400)
RBC: 3.99 MIL/uL (ref 3.87–5.11)
RDW: 23 % — ABNORMAL HIGH (ref 11.5–15.5)
WBC: 2.9 10*3/uL — ABNORMAL LOW (ref 4.0–10.5)
nRBC: 0.7 % — ABNORMAL HIGH (ref 0.0–0.2)

## 2020-07-16 LAB — COMPREHENSIVE METABOLIC PANEL
ALT: 22 U/L (ref 0–44)
AST: 17 U/L (ref 15–41)
Albumin: 3.8 g/dL (ref 3.5–5.0)
Alkaline Phosphatase: 66 U/L (ref 38–126)
Anion gap: 12 (ref 5–15)
BUN: 14 mg/dL (ref 6–20)
CO2: 29 mmol/L (ref 22–32)
Calcium: 10 mg/dL (ref 8.9–10.3)
Chloride: 99 mmol/L (ref 98–111)
Creatinine, Ser: 0.45 mg/dL (ref 0.44–1.00)
GFR calc Af Amer: >60 mL/min (ref >60)
GFR calc non Af Amer: >60 mL/min (ref >60)
Glucose, Bld: 172 mg/dL — ABNORMAL HIGH (ref 70–99)
Potassium: 3.5 mmol/L (ref 3.5–5.1)
Sodium: 140 mmol/L (ref 135–145)
Total Bilirubin: 0.6 mg/dL (ref 0.3–1.2)
Total Protein: 6.6 g/dL (ref 6.5–8.1)

## 2020-07-16 LAB — GLUCOSE, CAPILLARY
Glucose-Capillary: 138 mg/dL — ABNORMAL HIGH (ref 70–99)
Glucose-Capillary: 162 mg/dL — ABNORMAL HIGH (ref 70–99)

## 2020-07-16 MED ORDER — HEPARIN SOD (PORK) LOCK FLUSH 100 UNIT/ML IV SOLN
500.0000 [IU] | Freq: Once | INTRAVENOUS | Status: AC
  Administered 2020-07-16: 500 [IU]
  Filled 2020-07-16: qty 5

## 2020-07-16 NOTE — Discharge Summary (Signed)
.  [place]  [phone] [phone]    Physician Discharge Summary     Patient ID: Heather [name] MRN: [id number] DOB/AGE: [birth year] 44 y.o.  Admit date: 07/12/2020 Discharge date: 07/16/2020  Primary Care Physician:  [name], MD   Discharge Diagnoses:    Present on Admission: . Diffuse large B-cell lymphoma of lymph nodes of multiple regions [facility])   Discharge Medications:  Allergies as of 07/16/2020   No Known Allergies     Medication List    STOP taking these medications   INSULIN SYRINGE .5CC/31GX5/16" 31G X 5/16" 0.5 ML Misc     TAKE these medications   acetaminophen 500 MG tablet Commonly known as: TYLENOL Take 500 mg by mouth every 6 (six) hours as needed for mild pain or headache.   acyclovir 400 MG tablet Commonly known as: ZOVIRAX Take 1 tablet (400 mg total) by mouth 2 (two) times daily.   B COMPLEX PO Take 1 tablet by mouth daily.   Biktarvy 50-200-25 MG Tabs tablet Generic drug: bictegravir-emtricitabine-tenofovir AF Take 1 tablet by mouth daily with breakfast.   cholecalciferol 25 MCG (1000 UNIT) tablet Commonly known as: VITAMIN D3 Take 1 tablet (1,000 Units total) by mouth at bedtime.   feeding supplement (ENSURE ENLIVE) Liqd Take 237 mLs by mouth 2 (two) times daily between meals.   HYDROcodone-acetaminophen 5-325 MG tablet Commonly known as: Norco Take 1 tablet by mouth every 6 (six) hours as needed for moderate pain.   ibuprofen 200 MG tablet Commonly known as: ADVIL Take 2 tablets (400 mg total) by mouth every 6 (six) hours as needed for mild pain.   lidocaine-prilocaine cream Commonly known as: EMLA Apply to affected area once What changed:   how much to take  how to take this  when to take this  reasons to take this   LORazepam 0.5 MG tablet Commonly known as: ATIVAN Take 1 tablet (0.5 mg total) by mouth every 6 (six) hours as needed (for chemo induced  nausea or vomiting).   multivitamin with minerals Tabs tablet Take 1 tablet by mouth at bedtime.   nystatin 100000 UNIT/ML suspension Commonly known as: MYCOSTATIN Take 5 mLs (500,000 Units total) by mouth 4 (four) times daily for 10 days.   ondansetron 8 MG tablet Commonly known as: ZOFRAN Take 1 tablet (8 mg total) by mouth every 8 (eight) hours as needed for nausea or vomiting.   polyethylene glycol 17 g packet Commonly known as: MIRALAX / GLYCOLAX Take 17 g by mouth daily as needed for mild constipation.   prochlorperazine 10 MG tablet Commonly known as: COMPAZINE Take 1 tablet (10 mg total) by mouth every 6 (six) hours as needed for nausea or vomiting.   senna-docusate 8.6-50 MG tablet Commonly known as: Senokot-S Take 2 tablets by mouth at bedtime as needed for mild constipation.   sodium bicarbonate/sodium chloride Soln 1 application by Mouth Rinse route 4 (four) times daily.        Disposition and Follow-up:   Significant Diagnostic Studies:  DG Chest Port 1 View  Result Date: 07/15/2020 CLINICAL DATA:  Left pleural effusion. EXAM: PORTABLE CHEST 1 VIEW COMPARISON:  05/12/2020 chest radiograph and prior. FINDINGS: Accessed right chest wall Port-A-Cath tip in the superior right atrium. Cardiomediastinal silhouette within normal limits. Clear right lung. Left basilar atelectasis. Trace left pleural effusion, decreased from prior exam. No pneumothorax. No acute osseous abnormalities. IMPRESSION: Trace left pleural effusion, decreased from prior exam. Left basilar  atelectasis. Electronically Signed   By: Primitivo Gauze M.D.   On: 07/15/2020 10:11   DG FLUORO GUIDE LUMBAR PUNCTURE  Result Date: 07/13/2020 CLINICAL DATA:  44 year old female with history of large B-cell lymphoma. EXAM: DIAGNOSTIC LUMBAR PUNCTURE UNDER FLUOROSCOPIC GUIDANCE FLUOROSCOPY TIME:  Fluoroscopy Time:  41 seconds PROCEDURE: Informed consent was obtained from the patient prior to the procedure,  including potential complications of headache, allergy, and pain. With the patient prone, the lower back was prepped with Betadine. 1% Lidocaine was used for local anesthesia. Lumbar puncture was performed at the L2-L3 level using a 20 gauge needle with return of clear CSF. 12 mg of methotrexate was injected intrathecally. The patient tolerated the procedure well and there were no apparent complications. IMPRESSION: 1. Successful uncomplicated fluoroscopic guided lumbar puncture for intrathecal chemotherapy injection, as above. Electronically Signed   By: Vinnie Langton M.D.   On: 07/13/2020 15:20   DG FLUORO GUIDE LUMBAR PUNCTURE  Result Date: 06/21/2020 CLINICAL DATA:  Diffuse large B-cell lymphoma. EXAM: FLUOROSCOPICALLY GUIDED LUMBAR PUNCTURE FOR INTRATHECAL CHEMOTHERAPY FLUOROSCOPY TIME:  1 minutes 0 seconds PROCEDURE: Informed consent was obtained from the patient prior to the procedure, including potential complications of headache, allergy, and pain. Safety time-out was performed. With the patient prone, the lower back was prepped with Betadine. 1% Lidocaine was used for local anesthesia. Lumbar puncture was performed at the L3-4 level using a 20 gauge needle with return of bloody CSF. 5 mL of Omnipaque 180 contrast was injected to confirm intrathecal position of the needle. 5 mL of methotrexate chemotherapy obtained from the hospital pharmacy was injected into the subarachnoid space. The needle was subsequently removed, and a sterile bandage was applied at the skin puncture site. The patient tolerated the procedure well without apparent complication. IMPRESSION: Successful lumbar puncture and intrathecal chemotherapy administration under fluoroscopic guidance. No evidence of immediate complication. Electronically Signed   By: Marlaine Hind M.D.   On: 06/21/2020 13:15    Discharge Laboratory Values: . CBC Latest Ref Rng & Units 07/16/2020 07/15/2020 07/14/2020  WBC 4.0 - 10.5 K/uL 2.9(L) 3.6(L) 7.3    Hemoglobin 12.0 - 15.0 g/dL 11.1(L) 11.1(L) 11.0(L)  Hematocrit 36 - 46 % 33.6(L) 34.7(L) 34.5(L)  Platelets 150 - 400 K/uL 228 245 232    . CMP Latest Ref Rng & Units 07/16/2020 07/15/2020 07/15/2020  Glucose 70 - 99 mg/dL 172(H) 433(H) 303(H)  BUN 6 - 20 mg/dL 14 - 15  Creatinine 0.44 - 1.00 mg/dL 0.45 - 0.63  Sodium 135 - 145 mmol/L 140 - 139  Potassium 3.5 - 5.1 mmol/L 3.5 - 3.7  Chloride 98 - 111 mmol/L 99 - 100  CO2 22 - 32 mmol/L 29 - 26  Calcium 8.9 - 10.3 mg/dL 10.0 - 10.1  Total Protein 6.5 - 8.1 g/dL 6.6 - 6.9  Total Bilirubin 0.3 - 1.2 mg/dL 0.6 - 0.4  Alkaline Phos 38 - 126 U/L 66 - 68  AST 15 - 41 U/L 17 - 22  ALT 0 - 44 U/L 22 - 23     Brief H and P: For complete details please refer to admission H and P, but in brief, Ms. Ceniceros is a wonderful 44 y.o. female with a past medical history significant for hepatitis B, HIV, chronic anemia who presented to [place] with abdominal pain. She had a 2-week history of early satiety, poor appetite and, 5 pound weight loss. Abdominal pain is typically postprandial and mainly at night after dinner while  laying in bed. Has been associated with nausea improves after vomiting. Labs on admission showed a hemoglobin of 9.3, MCV 67.4, platelet count 554,000. Lipase was mildly elevated at 123. 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  During her initial consult, she was having ongoingpain is primarily in her epigastric area and radiates to the left side of her abdomen. States pain is worse when  laying down after she eats. She states that she has had this pain for at least 2 weeks. Her abdominal pain is worse after eating. She has had some nausea and intermittent vomiting. Vomiting makes her pain better. She was attributing her symptoms to some the medications that she was taking for fertility treatments. She reports a poor appetite and about a 5 pound weight loss. She is not having any fevers or chills. Denies night sweats. She has not noticed any palpable lymphadenopathy. The patient underwent a biopsy of her pancreatic mass on 04/08/2020 ([id number]) with results feeling diffuse large B-cell lymphoma.  It was recommended for her to undergo systemic chemotherapy with EPOCH-R.   The patient reports that she is feeling well overall today.  Denies mucositis, abdominal pain, nausea, vomiting.  Denies chest pain or shortness of breath. No bleeding reported.  Appetite remains good. She is here for admission for cycle #5 of EPOCH-R.   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 Hemoglobin C], childhood malaria. Patient notes that she had a CT of the abdomen sometime in 2020 that showed no acute pathology.This was not [facility]. Not accessible to [place] at this point. She did have an MRI of the abdomen in July 2019 which showed indeterminate splenic lesions.No other acute abdominal pathology.No concern with hepatocellular carcinoma.  Patient is presenting now with  #1Pancreatic massalong with retroperitoneal lymphadenopathy and diffuse splenic lesions.due to diffuse large b cell lymphoma No internal necrosis within the mass noted. Diffuse splenomegaly. Left periaortic lymph node causing mild displacement of  the third portion of the duodenum.  Significantly elevated LDH levels. 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  PET scan on 06/11/2020 following 3 cycles of chemotherapy showed "1. Marked reduction in size and metabolic activity of splenic mass. Residual mass has mild metabolic activity slightly greater than liver activity ( [place] 4). 2. No new metastatic disease. 3. Diffuse marrow activity related to GCSF type response."  # 2 Left sided large pleural effusion and lung atelectasis. S/p diagnostic and therapeutic thoracentesis -- lymphocytic predominance @ 80%  HIV could be risk factors for high grade EBV driven lymphomas  Patient symptomatology has developed rather quickly over the last few weeks.  #74microcytic anemia-chronic.Some element of iron deficiency versus hemoglobinopathy versus anemia of chronic disease related to malignancy. Patient has received 1 dose of IV Feraheme on 04/08/2020 and repeated on 04/22/2020  #4history of HIV/AIDS follows with Dr. Melynda Keller and at [facility].   PLAN: -labs stable.  Patient tolerated C5 of EPOCH and 3rd dose of IT MTX without any prohibitive toxicities. -Continue as needed antiemetics. Glori Bickers andPRNSenokot-S have been ordered. -Will need to be cautious with rituximab to prevent recurrent hepatitis B flare. Repeat HBV DNA undetectable on 06/02/2020. -continue with Biktarvy - no interactions - this was confirmed with patient ID physician and our pharmacy. -Continue Acyclovir prophylaxis. -Discussed hyperglycemia noted on labs this morning.  Hyperglycemia likely due to steroids.  I anticipate that her blood sugars will correct when she is home and off the steroids.  Will check CBGs before meals and at bedtime and start her on sliding scale insulin. -She is developing a sore throat.  No thrush was noted on exam, but the  patient is prone to getting thrush following chemotherapy.  We will start her on nystatin swish and swallow and continue as an outpatient. -Anticipate hospital discharge on 07/16/2020.  The patient is scheduled the cancer center on9/13/2021 for Rituxan and Neulasta and for labs and follow-up visit on 07/26/2020.   Physical Exam at Discharge: BP 123/75   Pulse (!) 54   Temp 97.9 F (36.6 C) (Oral)   Resp 18   Ht 5' (1.524 m)   Wt 141 lb (64 kg)   SpO2 100%   BMI 27.54 kg/m  . GENERAL:alert, in no acute distress and comfortable SKIN: no acute rashes, no significant lesions EYES: conjunctiva are pink and non-injected, sclera anicteric OROPHARYNX: MMM, no exudates, no oropharyngeal erythema or ulceration NECK: supple, no JVD LYMPH:  no palpable lymphadenopathy in the cervical, axillary or inguinal regions LUNGS: clear to auscultation b/l with normal respiratory effort HEART: regular rate & rhythm ABDOMEN:  normoactive bowel sounds , non tender, not distended. Extremity: no pedal edema PSYCH: alert & oriented x 3 with fluent speech NEURO: no focal motor/sensory deficits     Hospital Course:  Active Problems:   Diffuse large B-cell lymphoma of lymph nodes of multiple regions [facility])   Encounter for antineoplastic chemotherapy   Lymphoma malignant, large cell (HCC)   Diet:  Regular  Activity:  Infection precautions  Condition at Discharge:   Stable  Signed: Dr. Sullivan Lone MD [place] [phone]  07/16/2020, 10:40 AM TT discharging patient>30 mins

## 2020-07-18 ENCOUNTER — Inpatient Hospital Stay: Payer: 59

## 2020-07-18 ENCOUNTER — Inpatient Hospital Stay: Payer: 59 | Attending: Hematology

## 2020-07-18 ENCOUNTER — Other Ambulatory Visit: Payer: Self-pay

## 2020-07-18 VITALS — BP 98/67 | HR 89 | Temp 98.3°F | Resp 17

## 2020-07-18 DIAGNOSIS — Z5189 Encounter for other specified aftercare: Secondary | ICD-10-CM | POA: Insufficient documentation

## 2020-07-18 DIAGNOSIS — Z5112 Encounter for antineoplastic immunotherapy: Secondary | ICD-10-CM | POA: Insufficient documentation

## 2020-07-18 DIAGNOSIS — C8338 Diffuse large B-cell lymphoma, lymph nodes of multiple sites: Secondary | ICD-10-CM | POA: Insufficient documentation

## 2020-07-18 DIAGNOSIS — Z7189 Other specified counseling: Secondary | ICD-10-CM

## 2020-07-18 MED ORDER — PEGFILGRASTIM-CBQV 6 MG/0.6ML ~~LOC~~ SOSY
PREFILLED_SYRINGE | SUBCUTANEOUS | Status: AC
  Filled 2020-07-18: qty 0.6

## 2020-07-18 MED ORDER — HEPARIN SOD (PORK) LOCK FLUSH 100 UNIT/ML IV SOLN
500.0000 [IU] | Freq: Once | INTRAVENOUS | Status: AC | PRN
  Administered 2020-07-18: 500 [IU]
  Filled 2020-07-18: qty 5

## 2020-07-18 MED ORDER — HYDROXYZINE HCL 25 MG PO TABS
25.0000 mg | ORAL_TABLET | Freq: Once | ORAL | Status: AC
  Administered 2020-07-18: 25 mg via ORAL
  Filled 2020-07-18: qty 1

## 2020-07-18 MED ORDER — SODIUM CHLORIDE 0.9 % IV SOLN
375.0000 mg/m2 | Freq: Once | INTRAVENOUS | Status: AC
  Administered 2020-07-18: 600 mg via INTRAVENOUS
  Filled 2020-07-18: qty 10

## 2020-07-18 MED ORDER — SODIUM CHLORIDE 0.9% FLUSH
10.0000 mL | INTRAVENOUS | Status: DC | PRN
  Administered 2020-07-18: 10 mL
  Filled 2020-07-18: qty 10

## 2020-07-18 MED ORDER — PEGFILGRASTIM-CBQV 6 MG/0.6ML ~~LOC~~ SOSY
6.0000 mg | PREFILLED_SYRINGE | Freq: Once | SUBCUTANEOUS | Status: AC
  Administered 2020-07-18: 6 mg via SUBCUTANEOUS

## 2020-07-18 MED ORDER — ACETAMINOPHEN 325 MG PO TABS
ORAL_TABLET | ORAL | Status: AC
  Filled 2020-07-18: qty 2

## 2020-07-18 MED ORDER — ACETAMINOPHEN 325 MG PO TABS
650.0000 mg | ORAL_TABLET | Freq: Once | ORAL | Status: AC
  Administered 2020-07-18: 650 mg via ORAL

## 2020-07-18 MED ORDER — SODIUM CHLORIDE 0.9 % IV SOLN
Freq: Once | INTRAVENOUS | Status: AC
  Filled 2020-07-18: qty 250

## 2020-07-18 NOTE — Patient Instructions (Signed)
[facility] °Discharge Instructions for Patients Receiving Chemotherapy ° °Today you received the following chemotherapy agents: Rituximab ° °To help prevent nausea and vomiting after your treatment, we encourage you to take your nausea medication as directed.  °  °If you develop nausea and vomiting that is not controlled by your nausea medication, call the clinic.  ° °BELOW ARE SYMPTOMS THAT SHOULD BE REPORTED IMMEDIATELY: °· *FEVER GREATER THAN 100.5 F °· *CHILLS WITH OR WITHOUT FEVER °· NAUSEA AND VOMITING THAT IS NOT CONTROLLED WITH YOUR NAUSEA MEDICATION °· *UNUSUAL SHORTNESS OF BREATH °· *UNUSUAL BRUISING OR BLEEDING °· TENDERNESS IN MOUTH AND THROAT WITH OR WITHOUT PRESENCE OF ULCERS °· *URINARY PROBLEMS °· *BOWEL PROBLEMS °· UNUSUAL RASH °Items with * indicate a potential emergency and should be followed up as soon as possible. ° °Feel free to call the clinic should you have any questions or concerns. The clinic phone number is [phone]. ° °Please show the CHEMO ALERT CARD at check-in to the Emergency Department and triage nurse. ° ° °

## 2020-07-23 ENCOUNTER — Encounter: Payer: Self-pay | Admitting: Hematology

## 2020-07-25 ENCOUNTER — Other Ambulatory Visit: Payer: 59

## 2020-07-25 ENCOUNTER — Other Ambulatory Visit: Payer: Self-pay | Admitting: Hematology

## 2020-07-25 DIAGNOSIS — C8338 Diffuse large B-cell lymphoma, lymph nodes of multiple sites: Secondary | ICD-10-CM

## 2020-07-25 NOTE — Progress Notes (Signed)
bc

## 2020-07-26 ENCOUNTER — Inpatient Hospital Stay: Payer: 59

## 2020-07-26 ENCOUNTER — Inpatient Hospital Stay: Payer: 59 | Admitting: Hematology

## 2020-07-26 ENCOUNTER — Other Ambulatory Visit: Payer: Self-pay

## 2020-07-26 VITALS — BP 123/88 | HR 121 | Temp 98.9°F | Resp 18 | Ht 60.0 in | Wt 141.4 lb

## 2020-07-26 DIAGNOSIS — Z5112 Encounter for antineoplastic immunotherapy: Secondary | ICD-10-CM | POA: Diagnosis not present

## 2020-07-26 DIAGNOSIS — C8338 Diffuse large B-cell lymphoma, lymph nodes of multiple sites: Secondary | ICD-10-CM

## 2020-07-26 DIAGNOSIS — J0100 Acute maxillary sinusitis, unspecified: Secondary | ICD-10-CM | POA: Diagnosis not present

## 2020-07-26 DIAGNOSIS — Z95828 Presence of other vascular implants and grafts: Secondary | ICD-10-CM

## 2020-07-26 LAB — CMP (CANCER CENTER ONLY)
ALT: 26 U/L (ref 0–44)
AST: 21 U/L (ref 15–41)
Albumin: 3.1 g/dL — ABNORMAL LOW (ref 3.5–5.0)
Alkaline Phosphatase: 70 U/L (ref 38–126)
Anion gap: 11 (ref 5–15)
BUN: 7 mg/dL (ref 6–20)
CO2: 26 mmol/L (ref 22–32)
Calcium: 9.5 mg/dL (ref 8.9–10.3)
Chloride: 105 mmol/L (ref 98–111)
Creatinine: 0.71 mg/dL (ref 0.44–1.00)
GFR, Est AFR Am: >60 mL/min (ref >60)
GFR, Estimated: >60 mL/min (ref >60)
Glucose, Bld: 172 mg/dL — ABNORMAL HIGH (ref 70–99)
Potassium: 3.6 mmol/L (ref 3.5–5.1)
Sodium: 142 mmol/L (ref 135–145)
Total Bilirubin: 0.3 mg/dL (ref 0.3–1.2)
Total Protein: 6.2 g/dL — ABNORMAL LOW (ref 6.5–8.1)

## 2020-07-26 LAB — CBC WITH DIFFERENTIAL/PLATELET
Abs Immature Granulocytes: 2.27 10*3/uL — ABNORMAL HIGH (ref 0.00–0.07)
Basophils Absolute: 0 10*3/uL (ref 0.0–0.1)
Basophils Relative: 0 %
Eosinophils Absolute: 0.1 10*3/uL (ref 0.0–0.5)
Eosinophils Relative: 1 %
HCT: 27.1 % — ABNORMAL LOW (ref 36.0–46.0)
Hemoglobin: 8.8 g/dL — ABNORMAL LOW (ref 12.0–15.0)
Immature Granulocytes: 20 %
Lymphocytes Relative: 4 %
Lymphs Abs: 0.4 10*3/uL — ABNORMAL LOW (ref 0.7–4.0)
MCH: 27.6 pg (ref 26.0–34.0)
MCHC: 32.5 g/dL (ref 30.0–36.0)
MCV: 85 fL (ref 80.0–100.0)
Monocytes Absolute: 1.5 10*3/uL — ABNORMAL HIGH (ref 0.1–1.0)
Monocytes Relative: 13 %
Neutro Abs: 7.3 10*3/uL (ref 1.7–7.7)
Neutrophils Relative %: 62 %
Platelets: 152 10*3/uL (ref 150–400)
RBC: 3.19 MIL/uL — ABNORMAL LOW (ref 3.87–5.11)
RDW: 21.1 % — ABNORMAL HIGH (ref 11.5–15.5)
WBC: 11.7 10*3/uL — ABNORMAL HIGH (ref 4.0–10.5)
nRBC: 0.9 % — ABNORMAL HIGH (ref 0.0–0.2)

## 2020-07-26 LAB — SAMPLE TO BLOOD BANK

## 2020-07-26 MED ORDER — SODIUM CHLORIDE 0.9% FLUSH
10.0000 mL | Freq: Once | INTRAVENOUS | Status: AC
  Administered 2020-07-26: 10 mL
  Filled 2020-07-26: qty 10

## 2020-07-26 MED ORDER — ONDANSETRON HCL 8 MG PO TABS
8.0000 mg | ORAL_TABLET | Freq: Three times a day (TID) | ORAL | 1 refills | Status: AC | PRN
Start: 2020-07-26 — End: ?

## 2020-07-26 MED ORDER — AMOXICILLIN-POT CLAVULANATE 875-125 MG PO TABS: 1.0000 | ORAL_TABLET | Freq: Two times a day (BID) | ORAL | 0 refills | Status: DC

## 2020-07-26 MED ORDER — HEPARIN SOD (PORK) LOCK FLUSH 100 UNIT/ML IV SOLN
500.0000 [IU] | Freq: Once | INTRAVENOUS | Status: AC
  Administered 2020-07-26: 500 [IU]
  Filled 2020-07-26: qty 5

## 2020-07-26 NOTE — Progress Notes (Signed)
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 female with a past medical history significant for hepatitis B, HIV, chronic anemia who presented to [place] with abdominal pain.  She had a 2-week history of early satiety, poor appetite and, 5 pound weight loss.  Abdominal pain is typically postprandial and mainly at night after dinner while laying in bed.  Has been associated with nausea improves after vomiting.  Labs on admission showed a hemoglobin of 9.3, MCV 67.4, platelet count 554,000.  Lipase was mildly elevated at 123.  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  Husband was at the bedside at the time of the visit.  The patient reports that she is having ongoing abdominal discomfort today.  Pain is primarily in her epigastric area and radiates to the left side of her abdomen.  States pain is worse when laying down after she eats.  She states that she has had this pain for at least 2 weeks.  Her abdominal pain is worse after eating.  She has had some nausea and intermittent vomiting.  Vomiting makes her pain better.  She was attributing her symptoms to some the medications that she was taking for fertility treatments.  She reports a poor appetite and about a 5 pound weight loss.  She is not having  any fevers or chills.  Denies night sweats.  She has not noticed any palpable lymphadenopathy.  She denies headaches, dizziness, chest pain, shortness of breath, diarrhea.  She reports intermittent constipation.  Denies bleeding or lower extremity edema.  The patient is married and has no children.  She currently works as an Therapist, sports.  Denies alcohol tobacco use.  Family history significant for paternal grandmother with uterine cancer.  Medical oncology was asked see the patient to make recommendations regarding her abnormal CT scan findings.  INTERVAL HISTORY: Heather [name] is a 44 year old female who is here for evaluation and management of Large B-cell lymphoma. We are joined today by her husband. The patient's last visit with [place] was on 07/05/2020. The pt reports that she is doing well overall.  The pt reports that she has had some low-grade fever and sinus pressure. She has no history of seasonal allergies. Pt began to feel a dull ache in her ears yesterday after a walk. Pt is experiencing runny nose with discharge that is yellow in the morning and clear throughout the day. Her fever has resolved.   She denies much bone pain after her last Neulasta injection. She has continued taking Claritin at home.   Pt was getting blood sugar readings above 300 after meals for the first two days after treatment. Her levels were around 130 first thing in the morning.   Lab results today (07/26/20) of CBC w/diff and CMP is as follows: all values are WNL except for WBC at 11.7K, RBC at 3.19, Hgb at 8.8, HCT at 27.1, RDW at 21.1,  nRBC at 0.9, Lymphs Abs at 0.4K, Mono Abs at 1.5K, WBC Morphology shows "Dohle Bodies", Abs Immature Granulocytes at 2.27K, Schistocytes are "Present", Polychromasia is "Present", Ovalocytes are "Present", Glucose at 172, Total Protein at 6.2, Albumin at 3.1.  On review of systems, pt reports sinus pressure, ear ache, dull headache, rhinorrhea and denies sore throat, fever, cough, SOB, diarrhea,  muscle pain, mouth sores, leg swelling, worsening neuropathy, low appetite, bone pain and any other symptoms.   MEDICAL HISTORY:  Past Medical History:  Diagnosis Date  . Anemia   . Diffuse large B-cell lymphoma of lymph nodes of multiple regions ([place]) 04/12/2020  . 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 History of blood transfusion    childhood  . HIV (human immunodeficiency virus infection) ([place])   . Immune deficiency disorder [facility])     SURGICAL HISTORY: Past Surgical History:  Procedure Laterality Date  . CHROMOPERTUBATION Bilateral 11/29/2016   Procedure: CHROMOPERTUBATION;  Surgeon: Waymon Amato, MD;  Location: [facility];  Service: Gynecology;  Laterality: Bilateral;  fallopian tubes  . ENDOMETRIAL BIOPSY    . IR IMAGING GUIDED PORT INSERTION  04/15/2020  . IR THORACENTESIS ASP PLEURAL SPACE W/IMG GUIDE  04/15/2020  . MYOMECTOMY N/A 11/29/2016   Procedure: MYOMECTOMY;  Surgeon: Waymon Amato, MD;  Location: [facility];  Service: Gynecology;  Laterality: N/A;    SOCIAL HISTORY: Social History   Socioeconomic History  . Marital status: Married    Spouse name: Not on file  . Number of children: Not on file  . Years of education: Not on file  . Highest education level: Not on file  Occupational History  . Not on file  Tobacco Use  . Smoking status: Never Smoker  . Smokeless tobacco: Never Used  Substance and Sexual Activity  . Alcohol use: Yes    Comment: occ  . Drug use: No  . Sexual activity: Yes  Other Topics Concern  . Not on file  Social History Narrative  . Not on file   Social Determinants of Health   Financial Resource Strain:   . Difficulty of Paying Living Expenses: Not on file  Food Insecurity:   . Worried About Charity fundraiser in the Last Year: Not on file  . Ran Out of Food in the Last Year: Not on file  Transportation Needs:   . Lack of Transportation (Medical): Not on file  . Lack of Transportation (Non-Medical): Not on file  Physical Activity:   .  Days of Exercise per Week: Not on file  . Minutes of Exercise per Session: Not on file  Stress:   . Feeling of Stress : Not on file  Social Connections:   . Frequency of Communication with Friends and Family: Not on file  . Frequency of Social Gatherings with Friends and Family: Not on file  . Attends Religious Services: Not on file  . Active Member of Clubs or Organizations: Not on file  . Attends Archivist Meetings: Not on file  . Marital Status: Not on file  Intimate Partner Violence:   . Fear of Current or Ex-Partner: Not on file  . Emotionally Abused: Not on file  . Physically Abused: Not on file  . Sexually Abused: Not on file    FAMILY HISTORY: Family History  Problem Relation Age of Onset  . Diabetes Mother   . Hypertension Mother     ALLERGIES:  has No Known Allergies.  MEDICATIONS:  Current Outpatient Medications  Medication Sig Dispense Refill  . acetaminophen (TYLENOL) 500 MG tablet Take 500 mg by mouth every 6 (six) hours as needed for mild pain or headache.    Marland Kitchen acyclovir (ZOVIRAX) 400 MG tablet Take 1 tablet (400 mg total) by mouth 2 (two) times daily. 60 tablet 6  . amoxicillin-clavulanate (AUGMENTIN) 875-125 MG tablet Take 1 tablet by mouth 2 (two) times daily for 7 days. 14 tablet 0  . B Complex Vitamins (B COMPLEX PO) Take 1 tablet by mouth daily.    . bictegravir-emtricitabine-tenofovir AF (BIKTARVY) 50-200-25 MG TABS tablet Take 1 tablet by mouth daily with breakfast.     . cholecalciferol (VITAMIN D3) 25 MCG (1000 UNIT) tablet Take 1 tablet (1,000 Units total) by mouth at bedtime.    . feeding supplement, ENSURE ENLIVE, (ENSURE ENLIVE) LIQD Take 237 mLs by mouth 2 (two) times daily between meals. 237 mL 12  . HYDROcodone-acetaminophen (NORCO) 5-325 MG tablet Take 1 tablet by mouth every 6 (six) hours as needed for moderate pain. 30 tablet 0  . ibuprofen (ADVIL) 200 MG tablet Take 2 tablets (400 mg total) by mouth every 6 (six) hours as needed for  mild pain. (Patient not taking: Reported on 06/20/2020) 30 tablet 0  . lidocaine-prilocaine (EMLA) cream Apply to affected area once (Patient taking differently: Apply 1 application topically as needed (port access). Apply to affected area once) 30 g 3  . LORazepam (ATIVAN) 0.5 MG tablet Take 1 tablet (0.5 mg total) by mouth every 6 (six) hours as needed (for chemo induced nausea or vomiting). 30 tablet 0  . Multiple Vitamin (MULTIVITAMIN WITH MINERALS) TABS tablet Take 1 tablet by mouth at bedtime.     . ondansetron (ZOFRAN) 8 MG tablet Take 1 tablet (8 mg total) by mouth every 8 (eight) hours as needed for nausea or vomiting. 30 tablet 1  . polyethylene glycol (MIRALAX / GLYCOLAX) 17 g packet Take 17 g by mouth daily as needed for mild constipation. 14 each 0  . prochlorperazine (COMPAZINE) 10 MG tablet Take 1 tablet (10 mg total) by mouth every 6 (six) hours as needed for nausea or vomiting. 30 tablet 1  . senna-docusate (SENOKOT-S) 8.6-50 MG tablet Take 2 tablets by mouth at bedtime as needed for mild constipation.    . Sodium Chloride-Sodium Bicarb (SODIUM BICARBONATE/SODIUM CHLORIDE) SOLN 1 application by Mouth Rinse route 4 (four) times daily.     No current facility-administered medications for this visit.    REVIEW OF SYSTEMS:   A 10+ POINT REVIEW OF SYSTEMS WAS OBTAINED including neurology, dermatology, psychiatry, cardiac, respiratory, lymph, extremities, GI, GU, Musculoskeletal, constitutional, breasts, reproductive, HEENT.  All pertinent positives are noted in the HPI.  All others are negative.   PHYSICAL EXAMINATION: ECOG PERFORMANCE STATUS: 1 - Symptomatic but completely ambulatory  . Vitals:   07/26/20 1207  BP: 123/88  Pulse: (!) 121  Resp: 18  Temp: 98.9 F (37.2 C)  SpO2: 100%   Filed Weights   07/26/20 1207  Weight: 141 lb 6.4 oz (64.1 kg)   .Body mass index is 27.62 kg/m.   NAD  GENERAL:alert, in no acute distress and comfortable SKIN: no acute rashes, no  significant lesions EYES: conjunctiva are pink and non-injected, sclera anicteric OROPHARYNX: MMM, no exudates, no oropharyngeal erythema or ulceration NECK: supple, no JVD LYMPH:  no palpable lymphadenopathy in the cervical, axillary or inguinal regions LUNGS: clear to auscultation b/l with normal respiratory effort HEART: regular rate & rhythm ABDOMEN:  normoactive bowel sounds ,  non tender, not distended. No palpable hepatosplenomegaly.  Extremity: no pedal edema PSYCH: alert & oriented x 3 with fluent speech NEURO: no focal motor/sensory deficits  LABORATORY DATA:  I have reviewed the data as listed  . CBC Latest Ref Rng & Units 07/26/2020 07/16/2020 07/15/2020  WBC 4.0 - 10.5 K/uL 11.7(H) 2.9(L) 3.6(L)  Hemoglobin 12.0 - 15.0 g/dL 8.8(L) 11.1(L) 11.1(L)  Hematocrit 36 - 46 % 27.1(L) 33.6(L) 34.7(L)  Platelets 150 - 400 K/uL 152 228 245    . CMP Latest Ref Rng & Units 07/26/2020 07/16/2020 07/15/2020  Glucose 70 - 99 mg/dL 172(H) 172(H) 433(H)  BUN 6 - 20 mg/dL 7 14 -  Creatinine 0.44 - 1.00 mg/dL 0.71 0.45 -  Sodium 135 - 145 mmol/L 142 140 -  Potassium 3.5 - 5.1 mmol/L 3.6 3.5 -  Chloride 98 - 111 mmol/L 105 99 -  CO2 22 - 32 mmol/L 26 29 -  Calcium 8.9 - 10.3 mg/dL 9.5 10.0 -  Total Protein 6.5 - 8.1 g/dL 6.2(L) 6.6 -  Total Bilirubin 0.3 - 1.2 mg/dL 0.3 0.6 -  Alkaline Phos 38 - 126 U/L 70 66 -  AST 15 - 41 U/L 21 17 -  ALT 0 - 44 U/L 26 22 -   04/08/2020 Pancreatic Mass Surgical Pathology Report [phone]):     RADIOGRAPHIC STUDIES: I have personally reviewed the radiological images as listed and agreed with the findings in the report. DG Chest Port 1 View  Result Date: 07/15/2020 CLINICAL DATA:  Left pleural effusion. EXAM: PORTABLE CHEST 1 VIEW COMPARISON:  05/12/2020 chest radiograph and prior. FINDINGS: Accessed right chest wall Port-A-Cath tip in the superior right atrium. Cardiomediastinal silhouette within normal limits. Clear right lung. Left basilar  atelectasis. Trace left pleural effusion, decreased from prior exam. No pneumothorax. No acute osseous abnormalities. IMPRESSION: Trace left pleural effusion, decreased from prior exam. Left basilar atelectasis. Electronically Signed   By: Primitivo Gauze M.D.   On: 07/15/2020 10:11   DG FLUORO GUIDE LUMBAR PUNCTURE  Result Date: 07/13/2020 CLINICAL DATA:  44 year old female with history of large B-cell lymphoma. EXAM: DIAGNOSTIC LUMBAR PUNCTURE UNDER FLUOROSCOPIC GUIDANCE FLUOROSCOPY TIME:  Fluoroscopy Time:  41 seconds PROCEDURE: Informed consent was obtained from the patient prior to the procedure, including potential complications of headache, allergy, and pain. With the patient prone, the lower back was prepped with Betadine. 1% Lidocaine was used for local anesthesia. Lumbar puncture was performed at the L2-L3 level using a 20 gauge needle with return of clear CSF. 12 mg of methotrexate was injected intrathecally. The patient tolerated the procedure well and there were no apparent complications. IMPRESSION: 1. Successful uncomplicated fluoroscopic guided lumbar puncture for intrathecal chemotherapy injection, as above. Electronically Signed   By: Vinnie Langton M.D.   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 Hemoglobin C], childhood malaria. Patient notes that she had a CT of the abdomen sometime in 2020 that showed no acute pathology.This was not [facility]. Not accessible to [place] at this point. She did have an MRI of the abdomen in July 2019 which showed indeterminate splenic lesions.No other acute abdominal pathology.No concern with hepatocellular carcinoma.  Patient is presenting now  with  #1Pancreatic mass along with retroperitoneal lymphadenopathy and  diffuse splenic lesions. No internal necrosis within the mass noted. Diffuse splenomegaly. Left periaortic lymph node causing mild displacement of the third portion of the duodenum.  Significantly elevated LDH levels. CA 19-9 and CEA levels unrevealing  # 2 Left sided large pleural effusion and lung atelectasis. S/p diagnostic and therapeutic thoracentesis -- lymphocytic predominance @ 80%  Overall picture concerning for possible  High grade B cell lymphoma vs primary pancreatic malignancy (though CA 19-9 levels indeterminate and not significantly elevated.  HIV could be risk factors for high grade EBV driven lymphomas  Patient symptomatology has developed rather quickly over the last few weeks.  #3 microcytic anemia-chronic.Some element of iron deficiency versus hemoglobinopathy versus anemia of chronic disease related to malignancy. Patient has received 1 dose of IV Feraheme Will rpt 2nd dose in 1 week.  #4 history of HIV/AIDS follows with Dr. Melynda Keller and at [facility].  # Leucocytosis - due to G-CSF PLAN: -Discussed pt labwork today, 07/26/20; all values are WNL except for WBC at 11.7K, RBC at 3.19, Hgb at 8.8, HCT at 27.1, RDW at 21.1, nRBC at 0.9, Lymphs Abs at 0.4K, Mono Abs at 1.5K, WBC Morphology shows "Dohle Bodies", Abs Immature Granulocytes at 2.27K, Schistocytes are "Present", Polychromasia is "Present", Ovalocytes are "Present", Glucose at 172, Total Protein at 6.2, Albumin at 3.1. -No evidence of ear infection on clinical exam -The pt has no prohibitive toxicities from continuing C6 EPOCH in 1 week. This is the last planned cycle.  -The pt has no prohibitive toxicities from receiving IT MTX on C6D2. This is treatment 4 of 4. -Recommend steam inhaler and sterile saline sprays 3-4 times per day. -Advised pt that there is a low threshold for prophylactic  intervention to prevent treatment delays. Will prescribe short-course of antibiotics. -Advised pt that steroids are contributing to her elevated blood glucose. This should resolve after treatment.   -Advised pt to hold COVID19 booster until after PET/CT scan.  -Continue Acyclovir for HSV prophylaxis.  -Will get PET/CT in 5 weeks -Refill Zofran -Will see back in 3 weeks with labs   FOLLOW UP: Inpatient admission for [facility] from 9/27 for 5 days Outpatient Rituxan and Udenyca on 08/08/2020 MD visit with portflush and labs on 08/15/2020 PET/CT in 5 weeks MD visit in 6 weeks with portflush and labs    The total time spent in the appt was 30 minutes and more than 50% was on counseling and direct patient cares, ordering and mx of chemotherapy  All of the patient's questions were answered with apparent satisfaction. The patient knows to call the clinic with any problems, questions or concerns.   Sullivan Lone MD [place] AAHIVMS [facility] Hematology/Oncology [facility]  (Office):       [phone] (Work cell):  [phone] (Fax):           [phone]  07/26/2020 1:21 PM  I, Yevette Edwards, am acting as a scribe for Dr. Sullivan Lone.   .I have reviewed the above documentation for accuracy and completeness, and I agree with the above. Brunetta Genera MD

## 2020-07-27 ENCOUNTER — Telehealth: Payer: Self-pay | Admitting: *Deleted

## 2020-07-27 NOTE — Telephone Encounter (Signed)
Patient scheduled per Dr.Kale for inpatient admission 08/01/20. Shawnin bed placement contacted with patient diagnosis and contact information. Patient will be contacted with admission time on9/27/21 Covid screening test scheduled for 07/30/20@ 1030AM [employer] site. Patientcontacted with all information - LVM. Encouraged patient to contact office for questions or concerns.  Email sent to Inpatient Chemotherapy group to notify of upcoming admission

## 2020-07-30 ENCOUNTER — Other Ambulatory Visit (HOSPITAL_COMMUNITY)
Admission: RE | Admit: 2020-07-30 | Discharge: 2020-07-30 | Disposition: A | Discharge status: Home / Self Care | Payer: 59 | Source: Ambulatory Visit | Attending: Hematology | Admitting: Hematology

## 2020-07-30 DIAGNOSIS — Z01812 Encounter for preprocedural laboratory examination: Secondary | ICD-10-CM | POA: Insufficient documentation

## 2020-07-30 DIAGNOSIS — Z20822 Contact with and (suspected) exposure to covid-19: Secondary | ICD-10-CM | POA: Insufficient documentation

## 2020-07-30 LAB — SARS CORONAVIRUS 2 (TAT 6-24 HRS): SARS Coronavirus 2: NEGATIVE

## 2020-08-01 ENCOUNTER — Other Ambulatory Visit: Payer: Self-pay | Admitting: Hematology

## 2020-08-01 ENCOUNTER — Other Ambulatory Visit: Payer: Self-pay

## 2020-08-01 ENCOUNTER — Encounter (HOSPITAL_COMMUNITY): Payer: Self-pay | Admitting: Hematology

## 2020-08-01 ENCOUNTER — Inpatient Hospital Stay (HOSPITAL_COMMUNITY)
Admission: AD | Admit: 2020-08-01 | Discharge: 2020-08-05 | DRG: 847 | Disposition: A | Discharge status: Home / Self Care | Payer: 59 | Source: Ambulatory Visit | Attending: Hematology | Admitting: Hematology

## 2020-08-01 DIAGNOSIS — B20 Human immunodeficiency virus [HIV] disease: Secondary | ICD-10-CM | POA: Diagnosis present

## 2020-08-01 DIAGNOSIS — R630 Anorexia: Secondary | ICD-10-CM | POA: Diagnosis present

## 2020-08-01 DIAGNOSIS — J90 Pleural effusion, not elsewhere classified: Secondary | ICD-10-CM | POA: Diagnosis present

## 2020-08-01 DIAGNOSIS — D509 Iron deficiency anemia, unspecified: Secondary | ICD-10-CM | POA: Diagnosis present

## 2020-08-01 DIAGNOSIS — B1910 Unspecified viral hepatitis B without hepatic coma: Secondary | ICD-10-CM | POA: Diagnosis present

## 2020-08-01 DIAGNOSIS — Z6827 Body mass index (BMI) 27.0-27.9, adult: Secondary | ICD-10-CM | POA: Diagnosis not present

## 2020-08-01 DIAGNOSIS — R590 Localized enlarged lymph nodes: Secondary | ICD-10-CM | POA: Diagnosis present

## 2020-08-01 DIAGNOSIS — Z20822 Contact with and (suspected) exposure to covid-19: Secondary | ICD-10-CM | POA: Diagnosis present

## 2020-08-01 DIAGNOSIS — C8330 Diffuse large B-cell lymphoma, unspecified site: Secondary | ICD-10-CM | POA: Diagnosis not present

## 2020-08-01 DIAGNOSIS — K869 Disease of pancreas, unspecified: Secondary | ICD-10-CM | POA: Diagnosis present

## 2020-08-01 DIAGNOSIS — J9811 Atelectasis: Secondary | ICD-10-CM | POA: Diagnosis present

## 2020-08-01 DIAGNOSIS — R161 Splenomegaly, not elsewhere classified: Secondary | ICD-10-CM | POA: Diagnosis present

## 2020-08-01 DIAGNOSIS — D630 Anemia in neoplastic disease: Secondary | ICD-10-CM | POA: Diagnosis present

## 2020-08-01 DIAGNOSIS — Z7189 Other specified counseling: Secondary | ICD-10-CM

## 2020-08-01 DIAGNOSIS — Z5111 Encounter for antineoplastic chemotherapy: Secondary | ICD-10-CM | POA: Diagnosis present

## 2020-08-01 DIAGNOSIS — C8338 Diffuse large B-cell lymphoma, lymph nodes of multiple sites: Secondary | ICD-10-CM | POA: Diagnosis present

## 2020-08-01 DIAGNOSIS — R634 Abnormal weight loss: Secondary | ICD-10-CM | POA: Diagnosis present

## 2020-08-01 DIAGNOSIS — C7889 Secondary malignant neoplasm of other digestive organs: Secondary | ICD-10-CM | POA: Diagnosis present

## 2020-08-01 LAB — CBC WITH DIFFERENTIAL/PLATELET
Abs Immature Granulocytes: 0.39 10*3/uL — ABNORMAL HIGH (ref 0.00–0.07)
Basophils Absolute: 0 10*3/uL (ref 0.0–0.1)
Basophils Relative: 1 %
Eosinophils Absolute: 0.1 10*3/uL (ref 0.0–0.5)
Eosinophils Relative: 2 %
HCT: 29.8 % — ABNORMAL LOW (ref 36.0–46.0)
Hemoglobin: 9.5 g/dL — ABNORMAL LOW (ref 12.0–15.0)
Immature Granulocytes: 7 %
Lymphocytes Relative: 14 %
Lymphs Abs: 0.8 10*3/uL (ref 0.7–4.0)
MCH: 28.2 pg (ref 26.0–34.0)
MCHC: 31.9 g/dL (ref 30.0–36.0)
MCV: 88.4 fL (ref 80.0–100.0)
Monocytes Absolute: 0.7 10*3/uL (ref 0.1–1.0)
Monocytes Relative: 13 %
Neutro Abs: 3.3 10*3/uL (ref 1.7–7.7)
Neutrophils Relative %: 63 %
Platelets: 327 10*3/uL (ref 150–400)
RBC: 3.37 MIL/uL — ABNORMAL LOW (ref 3.87–5.11)
RDW: 21.4 % — ABNORMAL HIGH (ref 11.5–15.5)
WBC: 5.3 10*3/uL (ref 4.0–10.5)
nRBC: 0.8 % — ABNORMAL HIGH (ref 0.0–0.2)

## 2020-08-01 LAB — COMPREHENSIVE METABOLIC PANEL
ALT: 19 U/L (ref 0–44)
AST: 21 U/L (ref 15–41)
Albumin: 3.7 g/dL (ref 3.5–5.0)
Alkaline Phosphatase: 69 U/L (ref 38–126)
Anion gap: 12 (ref 5–15)
BUN: 14 mg/dL (ref 6–20)
CO2: 25 mmol/L (ref 22–32)
Calcium: 9.4 mg/dL (ref 8.9–10.3)
Chloride: 104 mmol/L (ref 98–111)
Creatinine, Ser: 0.68 mg/dL (ref 0.44–1.00)
GFR calc Af Amer: >60 mL/min (ref >60)
GFR calc non Af Amer: >60 mL/min (ref >60)
Glucose, Bld: 101 mg/dL — ABNORMAL HIGH (ref 70–99)
Potassium: 3.6 mmol/L (ref 3.5–5.1)
Sodium: 141 mmol/L (ref 135–145)
Total Bilirubin: <0.1 mg/dL — ABNORMAL LOW (ref 0.3–1.2)
Total Protein: 6.6 g/dL (ref 6.5–8.1)

## 2020-08-01 LAB — PREGNANCY, URINE: Preg Test, Ur: NEGATIVE

## 2020-08-01 MED ORDER — SODIUM CHLORIDE 0.9% FLUSH: 10.0000 mL | INTRAVENOUS | Status: DC | PRN

## 2020-08-01 MED ORDER — SENNOSIDES-DOCUSATE SODIUM 8.6-50 MG PO TABS: 2.0000 | ORAL_TABLET | Freq: Every evening | ORAL | Status: DC | PRN

## 2020-08-01 MED ORDER — IBUPROFEN 200 MG PO TABS: 400.0000 mg | ORAL_TABLET | Freq: Four times a day (QID) | ORAL | Status: DC | PRN

## 2020-08-01 MED ORDER — VITAMIN D 25 MCG (1000 UNIT) PO TABS
1000.0000 [IU] | ORAL_TABLET | Freq: Every day | ORAL | Status: DC
  Administered 2020-08-01 – 2020-08-04 (×4): 1000 [IU] via ORAL
  Filled 2020-08-01 (×4): qty 1

## 2020-08-01 MED ORDER — ACETAMINOPHEN 500 MG PO TABS: 500.0000 mg | ORAL_TABLET | Freq: Four times a day (QID) | ORAL | Status: DC | PRN

## 2020-08-01 MED ORDER — LORAZEPAM 0.5 MG PO TABS: 0.5000 mg | ORAL_TABLET | Freq: Four times a day (QID) | ORAL | Status: DC | PRN

## 2020-08-01 MED ORDER — SODIUM CHLORIDE 0.9% FLUSH
10.0000 mL | Freq: Two times a day (BID) | INTRAVENOUS | Status: DC
  Administered 2020-08-01: 10 mL
  Administered 2020-08-01: 20 mL
  Administered 2020-08-02 – 2020-08-04 (×5): 10 mL

## 2020-08-01 MED ORDER — SODIUM CHLORIDE 0.9 % IV SOLN
Freq: Once | INTRAVENOUS | Status: AC
  Administered 2020-08-01: 18 mg via INTRAVENOUS
  Filled 2020-08-01: qty 4

## 2020-08-01 MED ORDER — ADULT MULTIVITAMIN W/MINERALS CH
1.0000 | ORAL_TABLET | Freq: Every day | ORAL | Status: DC
  Administered 2020-08-01 – 2020-08-04 (×4): 1 via ORAL
  Filled 2020-08-01 (×4): qty 1

## 2020-08-01 MED ORDER — PREDNISONE 20 MG PO TABS
60.0000 mg | ORAL_TABLET | Freq: Every day | ORAL | Status: DC
  Administered 2020-08-01 – 2020-08-05 (×4): 60 mg via ORAL
  Filled 2020-08-01 (×5): qty 3

## 2020-08-01 MED ORDER — POLYETHYLENE GLYCOL 3350 17 G PO PACK
17.0000 g | PACK | Freq: Every day | ORAL | Status: DC
  Administered 2020-08-03 – 2020-08-05 (×3): 17 g via ORAL
  Filled 2020-08-01 (×3): qty 1

## 2020-08-01 MED ORDER — PROCHLORPERAZINE MALEATE 10 MG PO TABS: 10.0000 mg | ORAL_TABLET | Freq: Four times a day (QID) | ORAL | Status: DC | PRN

## 2020-08-01 MED ORDER — LIDOCAINE-PRILOCAINE 2.5-2.5 % EX CREA: 1.0000 "application " | TOPICAL_CREAM | CUTANEOUS | Status: DC | PRN

## 2020-08-01 MED ORDER — CHLORHEXIDINE GLUCONATE CLOTH 2 % EX PADS
6.0000 | MEDICATED_PAD | Freq: Every day | CUTANEOUS | Status: DC
  Administered 2020-08-02 – 2020-08-05 (×4): 6 via TOPICAL

## 2020-08-01 MED ORDER — SODIUM CHLORIDE 0.9 % IV SOLN: INTRAVENOUS | Status: DC

## 2020-08-01 MED ORDER — MORPHINE SULFATE (PF) 2 MG/ML IV SOLN
1.0000 mg | INTRAVENOUS | Status: DC | PRN
  Administered 2020-08-02: 1 mg via INTRAVENOUS
  Filled 2020-08-01: qty 1

## 2020-08-01 MED ORDER — ONDANSETRON HCL 8 MG PO TABS: 8.0000 mg | ORAL_TABLET | Freq: Three times a day (TID) | ORAL | Status: DC | PRN

## 2020-08-01 MED ORDER — ENSURE ENLIVE PO LIQD
237.0000 mL | Freq: Two times a day (BID) | ORAL | Status: DC
  Administered 2020-08-01 – 2020-08-04 (×4): 237 mL via ORAL

## 2020-08-01 MED ORDER — SODIUM BICARBONATE/SODIUM CHLORIDE MOUTHWASH
1.0000 "application " | Freq: Four times a day (QID) | OROMUCOSAL | Status: DC
  Administered 2020-08-01 – 2020-08-05 (×16): 1 via OROMUCOSAL
  Filled 2020-08-01: qty 1000

## 2020-08-01 MED ORDER — VINCRISTINE SULFATE CHEMO INJECTION 1 MG/ML
Freq: Once | INTRAVENOUS | Status: AC
  Filled 2020-08-01: qty 8

## 2020-08-01 MED ORDER — BICTEGRAVIR-EMTRICITAB-TENOFOV 50-200-25 MG PO TABS
1.0000 | ORAL_TABLET | Freq: Every day | ORAL | Status: DC
  Administered 2020-08-02 – 2020-08-05 (×4): 1 via ORAL
  Filled 2020-08-01 (×4): qty 1

## 2020-08-01 MED ORDER — ACYCLOVIR 400 MG PO TABS
400.0000 mg | ORAL_TABLET | Freq: Two times a day (BID) | ORAL | Status: DC
  Administered 2020-08-01 – 2020-08-05 (×7): 400 mg via ORAL
  Filled 2020-08-01 (×8): qty 1

## 2020-08-01 MED ORDER — HYDROCODONE-ACETAMINOPHEN 5-325 MG PO TABS: 1.0000 | ORAL_TABLET | Freq: Four times a day (QID) | ORAL | Status: DC | PRN

## 2020-08-01 NOTE — Progress Notes (Signed)
Chemotherapy dosages, total VTBI, 2 pharmacy signatures, expiration date/time, and patient identifiers independently checked by me and by Cline Crock, RN.

## 2020-08-01 NOTE — Progress Notes (Signed)
No urine preg test needed today per Dr. Irene Limbo.  Kennith Center, Pharm.D., CPP 08/01/2020@3 :03 PM

## 2020-08-01 NOTE — H&P (Addendum)
[place]  [phone] [phone]   MEDICAL ONCOLOGY - ADMISSION H&P  Reason for Referral: Cycle #6 EPOCH-R  HPI: Ms. Uphoff is a wonderful 44 y.o. female with a past medical history significant for hepatitis B, HIV, chronic anemia who presented to [facility] with abdominal pain. She had a 2-week history of early satiety, poor appetite and, 5 pound weight loss. Abdominal pain is typically postprandial and mainly at night after dinner while laying in bed. Has been associated with nausea improves after vomiting. Labs on admission showed a hemoglobin of 9.3, MCV 67.4, platelet count 554,000. Lipase was mildly elevated at 123. 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  During her initial consult, she was having ongoingpain is primarily in her epigastric area and radiates to the left side of her abdomen. States pain is worse when laying down after she eats. She states that she has had this pain for at least 2 weeks. Her abdominal pain is worse after eating. She has had some nausea and intermittent vomiting. Vomiting makes her pain better. She was attributing her symptoms to some the medications that she was taking for fertility treatments. She reports a poor appetite and about a 5 pound weight loss. She is not having any fevers or chills. Denies night sweats. She has not noticed any palpable lymphadenopathy. The patient underwent a biopsy of her pancreatic mass on 04/08/2020 ([id number]) with results feeling diffuse  large B-cell lymphoma.  It was recommended for her to undergo systemic chemotherapy with EPOCH-R.   The patient reports that she is feeling well overall today.  Denies mucositis, abdominal pain, nausea, vomiting.  Denies chest pain or shortness of breath. No bleeding reported.  Appetite remains good.  She was recently given a 7-day course of Augmentin for sinus infection.  She reports that her sinus congestion has improved significantly.  Has 1 more dose of Augmentin left.  She is not having any fevers or chills.  She is here for admission for cycle #6 of EPOCH-R.      Past Medical History:  Diagnosis Date  . Anemia   . Diffuse large B-cell lymphoma of lymph nodes of multiple regions ([facility]) 04/12/2020  . 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 History of blood transfusion    childhood  . HIV (human immunodeficiency virus infection) ([facility])   . Immune deficiency disorder ([place])                            04/15/2020  . MYOMECTOMY N/A 11/29/2016   Procedure: MYOMECTOMY;  Surgeon: Waymon Amato, MD;  Location: [facility];  Service: Gynecology;  Laterality: N/A;    Medication Dose Route Frequency Provider Last Rate Last Admin  . acetaminophen (TYLENOL) tablet 650 mg  650 mg Oral Q4H PRN Maryanna Shape, NP      . Derrill Memo ON 05/31/2020] bictegravir-emtricitabine-tenofovir AF (BIKTARVY) 50-200-25 MG per tablet 1 tablet  1 tablet Oral Q breakfast Curcio, Roselie Awkward, NP      . Chlorhexidine Gluconate Cloth 2 % PADS 6 each  6 each Topical Daily  Maryanna Shape, NP      . cholecalciferol (VITAMIN D3) tablet 1,000 Units  1,000 Units Oral QHS Curcio, Kristin R, NP      . feeding supplement (ENSURE ENLIVE) (ENSURE ENLIVE) liquid 237 mL  237 mL Oral BID BM Curcio, Kristin R, NP      . HYDROcodone-acetaminophen (NORCO/VICODIN) 5-325 MG per tablet 1 tablet  1 tablet Oral Q6H PRN Curcio, Roselie Awkward, NP      . lidocaine-prilocaine (EMLA) cream 1 application  1 application Topical PRN Curcio, Kristin R, NP      .  LORazepam (ATIVAN) tablet 0.5 mg  0.5 mg Oral Q6H PRN Curcio, Roselie Awkward, NP      . multivitamin with minerals tablet 1 tablet  1 tablet Oral QHS Curcio, Kristin R, NP      . ondansetron (ZOFRAN) tablet 8 mg  8 mg Oral Q8H PRN Curcio, Kristin R, NP      . pantoprazole (PROTONIX) EC tablet 40 mg  40 mg Oral Daily Curcio, Kristin R, NP      . polyethylene glycol (MIRALAX / GLYCOLAX) packet 17 g  17 g Oral Daily PRN Curcio, Roselie Awkward, NP      . potassium chloride SA (KLOR-CON) CR tablet 20 mEq  20 mEq Oral Once Mikey Bussing R, NP      . prochlorperazine (COMPAZINE) tablet 10 mg  10 mg Oral Q6H PRN Curcio, Kristin R, NP      . sodium bicarbonate/sodium chloride mouthwash [id number]  1 application Mouth Rinse QID Curcio, Kristin R, NP      . sodium chloride flush (NS) 0.9 % injection 10-40 mL  10-40 mL Intracatheter Q12H Curcio, Kristin R, NP      . sodium chloride flush (NS) 0.9 % injection 10-40 mL  10-40 mL Intracatheter PRN Curcio, Roselie Awkward, NP           Family History  Problem Relation Age of Onset  . Diabetes Mother   . Hypertension Mother   Relation Age of Onset  . Diabetes Mother   . Hypertension Mother      Socioeconomic History  . Marital status: Married    Spouse name: Not on file  . Number of children: Not on file  . Years of education: Not on file  . Highest education level: Not on file  Occupational History  . Not on file  Tobacco Use  . Smoking status: Never Smoker  . Smokeless tobacco: Never Used  Substance and Sexual Activity  . Alcohol use: Yes    Comment: occ  . Drug use: No  . Sexual activity: Yes  Other Topics Concern  . Not on file  Social History Narrative  . Not on file   Social Determinants of Health   Financial Resource Strain:   . Difficulty of Paying Living Expenses:   Food Insecurity:   . Worried About Charity fundraiser in the Last Year:   . Arboriculturist in the Last Year:   Transportation Needs:   . Film/video editor (Medical):   Marland Kitchen  Lack of Transportation (Non-Medical):   Physical Activity:   . Days of Exercise per Week:   . Minutes of Exercise per Session:   Stress:   . Feeling of Stress :   Social Connections:   . Frequency of Communication with Friends and Family:   . Frequency of Social Gatherings with Friends and Family:   . Attends Religious Services:   . Active Member  of Clubs or Organizations:   . Attends Archivist Meetings:   Marland Kitchen Marital Status:   Intimate Partner Violence:   . Fear of Current or Ex-Partner:   . Emotionally Abused:   Marland Kitchen Physically Abused:   . Sexually Abused:   :  Review of Systems: A comprehensive 14 point review of systems was negative except as noted in the HPI.  Exam: Patient Vitals for the past 24 hrs: BP 119/85 (BP Location: Left Arm)   Pulse 91   Temp 97.8 F (36.6 C) (Oral)   Resp 10   SpO2 100%   General:  well-nourished in no acute distress.   Eyes:  no scleral icterus.   ENT:  There were no oropharyngeal lesions.   Neck was without thyromegaly.   Lymphatics:  Negative cervical, supraclavicular or axillary adenopathy.   Respiratory: Minutes left base. Cardiovascular:  Regular rate and rhythm, S1/S2, without murmur, rub or gallop.  There was no pedal edema.   GI:  abdomen was soft, flat, nontender, nondistended, without organomegaly. Musculoskeletal:  no spinal tenderness of palpation of vertebral spine.   Skin: No bruising or petechiae.  Keloids noted over her chest and abdomen. Neuro exam was nonfocal. Patient was alert and oriented.  Attention was good.   Language was appropriate.  Mood was normal without depression.  Speech was not pressured.  Thought content was not tangential.     Lab Results  Component Value Date   WBC 7.4 05/30/2020   HGB 10.0 (L) 05/30/2020   HCT 32.0 (L) 05/30/2020   PLT 233 05/30/2020   GLUCOSE 87 05/30/2020   ALT 20 05/30/2020   AST 17 05/30/2020   NA 138 05/30/2020   K 3.8 05/30/2020   CL 101 05/30/2020   CREATININE  0.53 05/30/2020   BUN 11 05/30/2020   CO2 27 05/30/2020    DG Chest 2 View  Result Date: 05/12/2020 CLINICAL DATA:  Chronic anemia with weight loss and decreased appetite EXAM: CHEST - 2 VIEW COMPARISON:  04/14/2020 FINDINGS: Cardiac shadows within normal limits. Right chest wall port is noted in satisfactory position. Small left pleural effusion is noted but decreased from the prior exam. Underlying atelectatic changes are noted. No bony abnormality is seen. IMPRESSION: Decrease in the degree of left-sided pleural effusion when compared with the prior exam. Electronically Signed   By: Inez Catalina M.D.   On: 05/12/2020 19:01     DG Chest 2 View  Result Date: 05/12/2020 CLINICAL DATA:  Chronic anemia with weight loss and decreased appetite EXAM: CHEST - 2 VIEW COMPARISON:  04/14/2020 FINDINGS: Cardiac shadows within normal limits. Right chest wall port is noted in satisfactory position. Small left pleural effusion is noted but decreased from the prior exam. Underlying atelectatic changes are noted. No bony abnormality is seen. IMPRESSION: Decrease in the degree of left-sided pleural effusion when compared with the prior exam. Electronically Signed   By: Inez Catalina M.D.   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 Hemoglobin C], childhood malaria. Patient notes that she had a CT of the abdomen sometime in 2020 that showed no acute pathology.This was not [facility]. Not accessible to [place] at this point. She did have an MRI of the abdomen in  July 2019 which showed indeterminate splenic lesions.No other acute abdominal pathology.No concern with hepatocellular carcinoma.  Patient is presenting now  with  #1Pancreatic massalong with retroperitoneal lymphadenopathy and diffuse splenic lesions.due to diffuse large b cell lymphoma No internal necrosis within the mass noted. Diffuse splenomegaly. Left periaortic lymph node causing mild displacement of the third portion of the duodenum.  Significantly elevated LDH levels. 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  PET scan on 06/11/2020 following 3 cycles of chemotherapy showed "1. Marked reduction in size and metabolic activity of splenic mass. Residual mass has mild metabolic activity slightly greater than liver activity ( [place]auville 4). 2. No new metastatic disease. 3. Diffuse marrow activity related to GCSF type response."  # 2 Left sided large pleural effusion and lung atelectasis. S/p diagnostic and therapeutic thoracentesis -- lymphocytic predominance @ 80%  HIV could be risk factors for high grade EBV driven lymphomas  Patient symptomatology has developed rather quickly over the last few weeks.  #59microcytic anemia-chronic.Some element of iron deficiency versus hemoglobinopathy versus anemia of chronic disease related to malignancy. Patient has received 1 dose of IV Feraheme on 04/08/2020 and repeated on 04/22/2020  #4history of HIV/AIDS follows with Dr. Melynda Keller and at [facility].   PLAN: -CBC with differential, CMET, and urine pregnancy test from today are pending.  After review, she will proceed with day 1 of cycle 6 of chemotherapy today as planned. -Check daily CBC with differential and CMET -Continue as needed antiemetics. -Scheduled MiraLAX and PRN Senokot-S have been ordered. -Will need to be cautious with rituximab to prevent recurrent hepatitis B flare.  Repeat HBV DNA undetectable on 06/02/2020. -continue with Biktarvy - no interactions - this was confirmed with patient ID physician and our pharmacy. -Continue  Acyclovir prophylaxis.  -We will plan for intrathecal methotrexate on day 2 of this cycle of chemotherapy.  The patient is scheduled the cancer center on 08/09/2020 for Rituxan and Neulasta and for a follow-up visit and lab work on 09/06/2020.    Mikey Bussing, DNP, AGPCNP-BC, AOCNP  ADDENDUM  .Patient was Personally and independently interviewed, examined and relevant elements of the history of present illness were reviewed in details and an assessment and plan was created. All elements of the patient's history of present illness , assessment and plan were discussed in details with Mikey Bussing, DNP, AGPCNP-BC, AOCNP. The above documentation reflects our combined findings assessment and plan.  Recent sinus infection resolved with Augmentin. No headache or sinus pressure. IT MTX dose 4 -- on 9/28 Tolerating treatment well thus far.  Sullivan Lone MD MS

## 2020-08-01 NOTE — Progress Notes (Signed)
Patient due to begin chemotherapy today. Chemotherapy dosages, total VTBI, 2 pharmacy signatures, expiration date/time, and patient identifiers independently checked by me and by Aldean Baker, RN.  Heather [name] [facility]  08/01/2020

## 2020-08-02 ENCOUNTER — Inpatient Hospital Stay (HOSPITAL_COMMUNITY): Payer: 59

## 2020-08-02 LAB — COMPREHENSIVE METABOLIC PANEL
ALT: 20 U/L (ref 0–44)
AST: 19 U/L (ref 15–41)
Albumin: 3.5 g/dL (ref 3.5–5.0)
Alkaline Phosphatase: 69 U/L (ref 38–126)
Anion gap: 8 (ref 5–15)
BUN: 12 mg/dL (ref 6–20)
CO2: 24 mmol/L (ref 22–32)
Calcium: 9.7 mg/dL (ref 8.9–10.3)
Chloride: 104 mmol/L (ref 98–111)
Creatinine, Ser: 0.56 mg/dL (ref 0.44–1.00)
GFR calc Af Amer: >60 mL/min (ref >60)
GFR calc non Af Amer: >60 mL/min (ref >60)
Glucose, Bld: 209 mg/dL — ABNORMAL HIGH (ref 70–99)
Potassium: 4 mmol/L (ref 3.5–5.1)
Sodium: 136 mmol/L (ref 135–145)
Total Bilirubin: 0.1 mg/dL — ABNORMAL LOW (ref 0.3–1.2)
Total Protein: 7 g/dL (ref 6.5–8.1)

## 2020-08-02 LAB — CBC
HCT: 34.1 % — ABNORMAL LOW (ref 36.0–46.0)
Hemoglobin: 10.2 g/dL — ABNORMAL LOW (ref 12.0–15.0)
MCH: 28.3 pg (ref 26.0–34.0)
MCHC: 29.9 g/dL — ABNORMAL LOW (ref 30.0–36.0)
MCV: 94.7 fL (ref 80.0–100.0)
Platelets: 258 10*3/uL (ref 150–400)
RBC: 3.6 MIL/uL — ABNORMAL LOW (ref 3.87–5.11)
RDW: 21.4 % — ABNORMAL HIGH (ref 11.5–15.5)
WBC: 7 10*3/uL (ref 4.0–10.5)
nRBC: 0.4 % — ABNORMAL HIGH (ref 0.0–0.2)

## 2020-08-02 LAB — GLUCOSE, CAPILLARY
Glucose-Capillary: 142 mg/dL — ABNORMAL HIGH (ref 70–99)
Glucose-Capillary: 153 mg/dL — ABNORMAL HIGH (ref 70–99)
Glucose-Capillary: 264 mg/dL — ABNORMAL HIGH (ref 70–99)

## 2020-08-02 MED ORDER — INSULIN ASPART 100 UNIT/ML ~~LOC~~ SOLN
0.0000 [IU] | Freq: Three times a day (TID) | SUBCUTANEOUS | Status: DC
  Administered 2020-08-02 – 2020-08-03 (×2): 2 [IU] via SUBCUTANEOUS
  Administered 2020-08-03: 3 [IU] via SUBCUTANEOUS
  Administered 2020-08-03 – 2020-08-04 (×2): 2 [IU] via SUBCUTANEOUS
  Administered 2020-08-04: 3 [IU] via SUBCUTANEOUS
  Administered 2020-08-04: 5 [IU] via SUBCUTANEOUS
  Administered 2020-08-05: 1 [IU] via SUBCUTANEOUS
  Administered 2020-08-05: 3 [IU] via SUBCUTANEOUS

## 2020-08-02 MED ORDER — ONDANSETRON HCL 4 MG/2ML IJ SOLN
8.0000 mg | Freq: Once | INTRAMUSCULAR | Status: AC
  Administered 2020-08-02: 8 mg via INTRAVENOUS
  Filled 2020-08-02: qty 4

## 2020-08-02 MED ORDER — VINCRISTINE SULFATE CHEMO INJECTION 1 MG/ML
Freq: Once | INTRAVENOUS | Status: AC
  Filled 2020-08-02: qty 8

## 2020-08-02 MED ORDER — ACETAMINOPHEN 325 MG PO TABS
650.0000 mg | ORAL_TABLET | Freq: Once | ORAL | Status: AC
  Administered 2020-08-02: 650 mg via ORAL
  Filled 2020-08-02: qty 2

## 2020-08-02 MED ORDER — SODIUM CHLORIDE 0.9 % IV SOLN
Freq: Once | INTRAVENOUS | Status: DC
  Filled 2020-08-02: qty 4

## 2020-08-02 MED ORDER — INSULIN ASPART 100 UNIT/ML ~~LOC~~ SOLN
0.0000 [IU] | Freq: Every day | SUBCUTANEOUS | Status: DC
  Administered 2020-08-02: 3 [IU] via SUBCUTANEOUS
  Administered 2020-08-03 – 2020-08-04 (×2): 4 [IU] via SUBCUTANEOUS

## 2020-08-02 MED ORDER — DEXAMETHASONE SODIUM PHOSPHATE 10 MG/ML IJ SOLN
6.0000 mg | Freq: Once | INTRAMUSCULAR | Status: AC
  Administered 2020-08-02: 6 mg via INTRAVENOUS
  Filled 2020-08-02: qty 1

## 2020-08-02 MED ORDER — SODIUM CHLORIDE (PF) 0.9 % IJ SOLN
Freq: Once | INTRAMUSCULAR | Status: AC
  Filled 2020-08-02: qty 0.48

## 2020-08-02 NOTE — Progress Notes (Addendum)
Marland Kitchen   HEMATOLOGY/ONCOLOGY INPATIENT PROGRESS NOTE  Date of Service: 08/02/2020  Inpatient Attending: .Brunetta Genera, Heather [name] was seen in follow-up for her cycle 6-day 2 of EPOCH-R chemotherapy for large B-cell lymphoma.  She is tolerating her IV chemotherapy well.  She is due for intrathecal methotrexate by IR later today.  Denies mucositis, abdominal pain, nausea, vomiting, shortness of breath, cough.  OBJECTIVE:  NAD  PHYSICAL EXAMINATION: . Vitals:   08/01/20 1702 08/01/20 2115 08/02/20 0146 08/02/20 0542  BP: 116/78 124/80 109/68 139/77  Pulse: 98 90 98 64  Resp: 18 17 17 17   Temp: 98.3 F (36.8 C) 98.1 F (36.7 C)  97.7 F (36.5 C)  TempSrc: Oral Oral  Oral  SpO2: 99% 97% 98% 100%  Weight:      Height:       Filed Weights   08/01/20 1444  Weight: 64.1 kg   .Body mass index is 27.62 kg/m.  Marland Kitchen GENERAL:alert, in no acute distress and comfortable SKIN: no acute rashes, no significant lesions EYES: conjunctiva are pink and non-injected, sclera anicteric OROPHARYNX: MMM, no exudates, no oropharyngeal erythema or ulceration NECK: supple, no JVD LYMPH:  no palpable lymphadenopathy in the cervical, axillary or inguinal regions LUNGS: clear to auscultation b/l with normal respiratory effort HEART: regular rate & rhythm ABDOMEN:  normoactive bowel sounds , non tender, not distended. Extremity: no pedal edema PSYCH: alert & oriented x 3 with fluent speech NEURO: no focal motor/sensory deficits    MEDICAL HISTORY:  Past Medical History:  Diagnosis Date  . Anemia   . Diffuse large B-cell lymphoma of lymph nodes of multiple regions ([place]) 04/12/2020  . 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 History of blood transfusion    childhood  . HIV (human immunodeficiency virus infection) ([place])   . Immune deficiency disorder [facility])     SURGICAL HISTORY: Past Surgical History:  Procedure Laterality Date  . CHROMOPERTUBATION Bilateral 11/29/2016    Procedure: CHROMOPERTUBATION;  Surgeon: Waymon Amato, MD;  Location: [facility];  Service: Gynecology;  Laterality: Bilateral;  fallopian tubes  . ENDOMETRIAL BIOPSY    . IR IMAGING GUIDED PORT INSERTION  04/15/2020  . IR THORACENTESIS ASP PLEURAL SPACE W/IMG GUIDE  04/15/2020  . MYOMECTOMY N/A 11/29/2016   Procedure: MYOMECTOMY;  Surgeon: Waymon Amato, MD;  Location: [facility];  Service: Gynecology;  Laterality: N/A;    SOCIAL HISTORY: Social History   Socioeconomic History  . Marital status: Married    Spouse name: Not on file  . Number of children: Not on file  . Years of education: Not on file  . Highest education level: Not on file  Occupational History  . Not on file  Tobacco Use  . Smoking status: Never Smoker  . Smokeless tobacco: Never Used  Substance and Sexual Activity  . Alcohol use: Yes    Comment: occ  . Drug use: No  . Sexual activity: Yes  Other Topics Concern  . Not on file  Social History Narrative  . Not on file   Social Determinants of Health   Financial Resource Strain:   . Difficulty of Paying Living Expenses: Not on file  Food Insecurity:   . Worried About Charity fundraiser in the Last Year: Not on file  . Ran Out of Food in the Last Year: Not on file  Transportation Needs:   . Lack of Transportation (Medical): Not on file  . Lack of Transportation (Non-Medical):  Not on file  Physical Activity:   . Days of Exercise per Week: Not on file  . Minutes of Exercise per Session: Not on file  Stress:   . Feeling of Stress : Not on file  Social Connections:   . Frequency of Communication with Friends and Family: Not on file  . Frequency of Social Gatherings with Friends and Family: Not on file  . Attends Religious Services: Not on file  . Active Member of Clubs or Organizations: Not on file  . Attends Archivist Meetings: Not on file  . Marital Status: Not on file  Intimate Partner Violence:   . Fear of Current or Ex-Partner: Not on file  .  Emotionally Abused: Not on file  . Physically Abused: Not on file  . Sexually Abused: Not on file    FAMILY HISTORY: Family History  Problem Relation Age of Onset  . Diabetes Mother   . Hypertension Mother     ALLERGIES:  has No Known Allergies.  MEDICATIONS:  Scheduled Meds: . acetaminophen  650 mg Oral Once  . acyclovir  400 mg Oral BID  . bictegravir-emtricitabine-tenofovir AF  1 tablet Oral Q breakfast  . Chlorhexidine Gluconate Cloth  6 each Topical Daily  . cholecalciferol  1,000 Units Oral QHS  . dexamethasone (DECADRON) injection  6 mg Intravenous Once  . DOXOrubicin/vinCRIStine/etoposide CHEMO IV infusion for Inpatient CI   Intravenous Once  . DOXOrubicin/vinCRIStine/etoposide CHEMO IV infusion for Inpatient CI   Intravenous Once  . feeding supplement (ENSURE ENLIVE)  237 mL Oral BID BM  . methotrexate INTRATHECAL (+/- HYDROCORTISONE,Ara-C)   Intrathecal Once  . multivitamin with minerals  1 tablet Oral QHS  . ondansetron  8 mg Intravenous Once  . polyethylene glycol  17 g Oral Daily  . predniSONE  60 mg Oral QAC breakfast  . sodium bicarbonate/sodium chloride  1 application Mouth Rinse QID  . sodium chloride flush  10-40 mL Intracatheter Q12H   Continuous Infusions: . sodium chloride 20 mL/hr at 08/01/20 1655  . ondansetron (ZOFRAN) with dexamethasone (DECADRON) IV     PRN Meds:.acetaminophen, HYDROcodone-acetaminophen, ibuprofen, lidocaine-prilocaine, LORazepam, morphine injection, ondansetron, prochlorperazine, senna-docusate, sodium chloride flush  REVIEW OF SYSTEMS:    10 Point review of Systems was done is negative except as noted above.   LABORATORY DATA:  I have reviewed the data as listed  . CBC Latest Ref Rng & Units 07/13/2020 07/12/2020  WBC 4.0 - 10.5 K/uL 6.1 5.4  Hemoglobin 12.0 - 15.0 g/dL 10.9(L) 10.0(L)  Hematocrit 36 - 46 % 34.7(L) 32.1(L)  Platelets 150 - 400 K/uL 205 228    . CMP Latest Ref Rng & Units 07/13/2020 07/12/2020  Glucose 70 -  99 mg/dL 277(H) 120(H)  BUN 6 - 20 mg/dL 14 15  Creatinine 0.44 - 1.00 mg/dL 0.54 0.50  Sodium 135 - 145 mmol/L 137 139  Potassium 3.5 - 5.1 mmol/L 4.3 3.7  Chloride 98 - 111 mmol/L 106 106  CO2 22 - 32 mmol/L 24 25  Calcium 8.9 - 10.3 mg/dL 9.8 8.7(L)  Total Protein 6.5 - 8.1 g/dL 6.8 6.2(L)  Total Bilirubin 0.3 - 1.2 mg/dL 0.3 0.4  Alkaline Phos 38 - 126 U/L 69 64  AST 15 - 41 U/L 25 21  ALT 0 - 44 U/L 24 20     RADIOGRAPHIC STUDIES: I have personally reviewed the radiological images as listed and agreed with the findings in the report. DG Chest Port 1 View  Result Date: 07/15/2020  CLINICAL DATA:  Left pleural effusion. EXAM: PORTABLE CHEST 1 VIEW COMPARISON:  05/12/2020 chest radiograph and prior. FINDINGS: Accessed right chest wall Port-A-Cath tip in the superior right atrium. Cardiomediastinal silhouette within normal limits. Clear right lung. Left basilar atelectasis. Trace left pleural effusion, decreased from prior exam. No pneumothorax. No acute osseous abnormalities. IMPRESSION: Trace left pleural effusion, decreased from prior exam. Left basilar atelectasis. Electronically Signed   By: Primitivo Gauze M.D.   On: 07/15/2020 10:11   DG FLUORO GUIDE LUMBAR PUNCTURE  Result Date: 07/13/2020 CLINICAL DATA:  44 year old female with history of large B-cell lymphoma. EXAM: DIAGNOSTIC LUMBAR PUNCTURE UNDER FLUOROSCOPIC GUIDANCE FLUOROSCOPY TIME:  Fluoroscopy Time:  41 seconds PROCEDURE: Informed consent was obtained from the patient prior to the procedure, including potential complications of headache, allergy, and pain. With the patient prone, the lower back was prepped with Betadine. 1% Lidocaine was used for local anesthesia. Lumbar puncture was performed at the L2-L3 level using a 20 gauge needle with return of clear CSF. 12 mg of methotrexate was injected intrathecally. The patient tolerated the procedure well and there were no apparent complications. IMPRESSION: 1. Successful  uncomplicated fluoroscopic guided lumbar puncture for intrathecal chemotherapy injection, as above. Electronically Signed   By: Vinnie Langton M.D.   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 Hemoglobin C], childhood malaria. Patient notes that she had a CT of the abdomen sometime in 2020 that showed no acute pathology.This was not [facility]. Not accessible to [place] at this point. She did have an MRI of the abdomen in July 2019 which showed indeterminate splenic lesions.No other acute abdominal pathology.No concern with hepatocellular carcinoma.  Patient is presenting now with  #1Pancreatic massalong with retroperitoneal lymphadenopathy and diffuse splenic lesions.due to diffuse large b cell lymphoma No internal necrosis within the mass noted. Diffuse splenomegaly. Left periaortic lymph node causing mild displacement of the third portion of the duodenum.  Significantly elevated LDH levels. 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  PET scan on 06/11/2020 following 3 cycles of chemotherapy showed "1. Marked reduction in size and metabolic activity of splenic mass. Residual mass has mild metabolic activity slightly greater than liver activity ( [place] 4). 2. No new metastatic disease. 3. Diffuse marrow activity related to GCSF type response."  # 2 Left sided large pleural effusion and lung atelectasis. S/p diagnostic and therapeutic thoracentesis -- lymphocytic predominance @ 80%  HIV could be risk factors for high grade EBV driven lymphomas  Patient symptomatology has developed rather quickly over the last few  weeks.  #15microcytic anemia-chronic.Some element of iron deficiency versus hemoglobinopathy versus anemia of chronic disease related to malignancy. Patient has received 1 dose of IV Feraheme on 04/08/2020 and repeated on 04/22/2020  #4history of HIV/AIDS follows with Dr. Melynda Keller and at [facility].   PLAN: -CBC with differential and CMET from today and are stable.  Okay to proceed with chemotherapy today as planned. -Received with intrathecal methotrexate as scheduled today. -Check daily CBC with differential and CMET -Continue as needed antiemetics. -Scheduled MiraLAX and PRN Senokot-S have been ordered. -Will need to be cautious with rituximab to prevent recurrent hepatitis B flare.  Repeat HBV  DNA undetectable on 06/02/2020. -continue with Biktarvy - no interactions - this was confirmed with patient ID physician and our pharmacy. -Continue Acyclovir prophylaxis.  -Glucose elevated on labs from this morning.  Will start CBGs before meals and at bedtime with sliding scale insulin.  Mikey Bussing, DNP, AGPCNP-BC, AOCNP Mon/Tues/Thurs/Fri 7am-5pm; Off Wednesdays Cell: [phone]   ADDENDUM  .Patient was Personally and independently interviewed, examined and relevant elements of the history of present illness were reviewed in details and an assessment and plan was created. All elements of the patient's history of present illness , assessment and plan were discussed in details with Mikey Bussing, DNP, AGPCNP-BC, AOCNP. The above documentation reflects our combined findings assessment and plan.  Sullivan Lone MD MS

## 2020-08-02 NOTE — Progress Notes (Signed)
Patient received zofran 8mg  and dex 6mg  premeds @ 11:50 am prior to IT Methotrexate. Do not need to repeat Z8D10IV prior to hanging chemo @ 13:45.  Z8D10IV discontinued.  Hardie Pulley, PharmD, BCPS, BCOP

## 2020-08-02 NOTE — Procedures (Signed)
The L3-L4 interspace was accessed after sterile prep and drape and local anesthesia with a 20 gauge spinal needle without difficulty. After documenting flow of clear CSF methotrexate/ solumedrol was injected through the spinal needle without difficulty.

## 2020-08-03 LAB — COMPREHENSIVE METABOLIC PANEL
ALT: 18 U/L (ref 0–44)
AST: 18 U/L (ref 15–41)
Albumin: 3.5 g/dL (ref 3.5–5.0)
Alkaline Phosphatase: 60 U/L (ref 38–126)
Anion gap: 10 (ref 5–15)
BUN: 12 mg/dL (ref 6–20)
CO2: 26 mmol/L (ref 22–32)
Calcium: 9.7 mg/dL (ref 8.9–10.3)
Chloride: 105 mmol/L (ref 98–111)
Creatinine, Ser: 0.58 mg/dL (ref 0.44–1.00)
GFR calc Af Amer: >60 mL/min (ref >60)
GFR calc non Af Amer: >60 mL/min (ref >60)
Glucose, Bld: 246 mg/dL — ABNORMAL HIGH (ref 70–99)
Potassium: 4 mmol/L (ref 3.5–5.1)
Sodium: 141 mmol/L (ref 135–145)
Total Bilirubin: 0.2 mg/dL — ABNORMAL LOW (ref 0.3–1.2)
Total Protein: 6.7 g/dL (ref 6.5–8.1)

## 2020-08-03 LAB — GLUCOSE, CAPILLARY
Glucose-Capillary: 163 mg/dL — ABNORMAL HIGH (ref 70–99)
Glucose-Capillary: 185 mg/dL — ABNORMAL HIGH (ref 70–99)
Glucose-Capillary: 234 mg/dL — ABNORMAL HIGH (ref 70–99)
Glucose-Capillary: 330 mg/dL — ABNORMAL HIGH (ref 70–99)

## 2020-08-03 LAB — CBC WITH DIFFERENTIAL/PLATELET
Abs Immature Granulocytes: 0.1 10*3/uL — ABNORMAL HIGH (ref 0.00–0.07)
Basophils Absolute: 0 10*3/uL (ref 0.0–0.1)
Basophils Relative: 0 %
Eosinophils Absolute: 0 10*3/uL (ref 0.0–0.5)
Eosinophils Relative: 0 %
HCT: 31.2 % — ABNORMAL LOW (ref 36.0–46.0)
Hemoglobin: 9.9 g/dL — ABNORMAL LOW (ref 12.0–15.0)
Lymphocytes Relative: 6 %
Lymphs Abs: 0.4 10*3/uL — ABNORMAL LOW (ref 0.7–4.0)
MCH: 28 pg (ref 26.0–34.0)
MCHC: 31.7 g/dL (ref 30.0–36.0)
MCV: 88.4 fL (ref 80.0–100.0)
Monocytes Absolute: 0.8 10*3/uL (ref 0.1–1.0)
Monocytes Relative: 13 %
Myelocytes: 1 %
Neutro Abs: 4.8 10*3/uL (ref 1.7–7.7)
Neutrophils Relative %: 80 %
Platelets: 303 10*3/uL (ref 150–400)
RBC: 3.53 MIL/uL — ABNORMAL LOW (ref 3.87–5.11)
RDW: 21.4 % — ABNORMAL HIGH (ref 11.5–15.5)
WBC: 6 10*3/uL (ref 4.0–10.5)
nRBC: 0.5 % — ABNORMAL HIGH (ref 0.0–0.2)

## 2020-08-03 MED ORDER — VINCRISTINE SULFATE CHEMO INJECTION 1 MG/ML
Freq: Once | INTRAVENOUS | Status: AC
  Filled 2020-08-03: qty 8

## 2020-08-03 MED ORDER — SODIUM CHLORIDE 0.9 % IV SOLN
Freq: Once | INTRAVENOUS | Status: AC
  Administered 2020-08-03: 18 mg via INTRAVENOUS
  Filled 2020-08-03: qty 4

## 2020-08-04 ENCOUNTER — Telehealth: Payer: Self-pay | Admitting: Hematology

## 2020-08-04 LAB — CBC WITH DIFFERENTIAL/PLATELET
Abs Immature Granulocytes: 0.46 10*3/uL — ABNORMAL HIGH (ref 0.00–0.07)
Basophils Absolute: 0 10*3/uL (ref 0.0–0.1)
Basophils Relative: 0 %
Eosinophils Absolute: 0 10*3/uL (ref 0.0–0.5)
Eosinophils Relative: 0 %
HCT: 33 % — ABNORMAL LOW (ref 36.0–46.0)
Hemoglobin: 10.6 g/dL — ABNORMAL LOW (ref 12.0–15.0)
Immature Granulocytes: 7 %
Lymphocytes Relative: 3 %
Lymphs Abs: 0.2 10*3/uL — ABNORMAL LOW (ref 0.7–4.0)
MCH: 28.2 pg (ref 26.0–34.0)
MCHC: 32.1 g/dL (ref 30.0–36.0)
MCV: 87.8 fL (ref 80.0–100.0)
Monocytes Absolute: 0.2 10*3/uL (ref 0.1–1.0)
Monocytes Relative: 3 %
Neutro Abs: 5.4 10*3/uL (ref 1.7–7.7)
Neutrophils Relative %: 87 %
Platelets: 293 10*3/uL (ref 150–400)
RBC: 3.76 MIL/uL — ABNORMAL LOW (ref 3.87–5.11)
RDW: 20.8 % — ABNORMAL HIGH (ref 11.5–15.5)
WBC: 6.3 10*3/uL (ref 4.0–10.5)
nRBC: 0.6 % — ABNORMAL HIGH (ref 0.0–0.2)

## 2020-08-04 LAB — COMPREHENSIVE METABOLIC PANEL
ALT: 20 U/L (ref 0–44)
AST: 19 U/L (ref 15–41)
Albumin: 3.9 g/dL (ref 3.5–5.0)
Alkaline Phosphatase: 70 U/L (ref 38–126)
Anion gap: 10 (ref 5–15)
BUN: 14 mg/dL (ref 6–20)
CO2: 26 mmol/L (ref 22–32)
Calcium: 10.1 mg/dL (ref 8.9–10.3)
Chloride: 102 mmol/L (ref 98–111)
Creatinine, Ser: 0.55 mg/dL (ref 0.44–1.00)
GFR calc Af Amer: >60 mL/min (ref >60)
GFR calc non Af Amer: >60 mL/min (ref >60)
Glucose, Bld: 196 mg/dL — ABNORMAL HIGH (ref 70–99)
Potassium: 3.7 mmol/L (ref 3.5–5.1)
Sodium: 138 mmol/L (ref 135–145)
Total Bilirubin: 0.5 mg/dL (ref 0.3–1.2)
Total Protein: 7.1 g/dL (ref 6.5–8.1)

## 2020-08-04 LAB — GLUCOSE, CAPILLARY
Glucose-Capillary: 158 mg/dL — ABNORMAL HIGH (ref 70–99)
Glucose-Capillary: 221 mg/dL — ABNORMAL HIGH (ref 70–99)
Glucose-Capillary: 280 mg/dL — ABNORMAL HIGH (ref 70–99)
Glucose-Capillary: 305 mg/dL — ABNORMAL HIGH (ref 70–99)

## 2020-08-04 MED ORDER — SODIUM CHLORIDE 0.9 % IV SOLN
Freq: Once | INTRAVENOUS | Status: AC
  Administered 2020-08-04: 8 mg via INTRAVENOUS
  Filled 2020-08-04: qty 4

## 2020-08-04 MED ORDER — VINCRISTINE SULFATE CHEMO INJECTION 1 MG/ML
Freq: Once | INTRAVENOUS | Status: AC
  Filled 2020-08-04: qty 8

## 2020-08-04 NOTE — Progress Notes (Addendum)
Heather [name]   HEMATOLOGY/ONCOLOGY INPATIENT PROGRESS NOTE  Date of Service: 08/04/2020  Inpatient Attending: .Brunetta Genera, [place] was seen in follow-up for her cycle 6-day 4 of EPOCH-R chemotherapy for large B-cell lymphoma.  She is tolerating her IV chemotherapy well.  Received intrathecal chemotherapy earlier this week and tolerated well.  She has no headaches or dizziness this morning.  Denies mucositis, abdominal pain, nausea, vomiting, shortness of breath, cough.  OBJECTIVE:  NAD  PHYSICAL EXAMINATION: . Vitals:   08/03/20 0555 08/03/20 1439 08/03/20 2100 08/04/20 0701  BP: (!) 141/80 120/76 111/66 124/72  Pulse: 61 91 71 (!) 59  Resp: 16 14 16 16   Temp: 97.8 F (36.6 C) 98.1 F (36.7 C) 97.9 F (36.6 C) 98 F (36.7 C)  TempSrc: Oral Oral Oral Oral  SpO2: 99% 99% 100% 100%  Weight:      Height:       Filed Weights   08/01/20 1444  Weight: 64.1 kg   .Body mass index is 27.62 kg/m.  Heather [name] GENERAL:alert, in no acute distress and comfortable SKIN: no acute rashes, no significant lesions EYES: conjunctiva are pink and non-injected, sclera anicteric OROPHARYNX: MMM, no exudates, no oropharyngeal erythema or ulceration NECK: supple, no JVD LYMPH:  no palpable lymphadenopathy in the cervical, axillary or inguinal regions LUNGS: clear to auscultation b/l with normal respiratory effort HEART: regular rate & rhythm ABDOMEN:  normoactive bowel sounds , non tender, not distended. Extremity: no pedal edema PSYCH: alert & oriented x 3 with fluent speech NEURO: no focal motor/sensory deficits    MEDICAL HISTORY:  Past Medical History:  Diagnosis Date  . Anemia   . Diffuse large B-cell lymphoma of lymph nodes of multiple regions ([place]) 04/12/2020  . 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 History of blood transfusion    childhood  . HIV (human immunodeficiency virus infection) ([place])   . Immune deficiency disorder [facility])     SURGICAL HISTORY: Past  Surgical History:  Procedure Laterality Date  . CHROMOPERTUBATION Bilateral 11/29/2016   Procedure: CHROMOPERTUBATION;  Surgeon: Waymon Amato, MD;  Location: [place] [facility];  Service: Gynecology;  Laterality: Bilateral;  fallopian tubes  . ENDOMETRIAL BIOPSY    . IR IMAGING GUIDED PORT INSERTION  04/15/2020  . IR THORACENTESIS ASP PLEURAL SPACE W/IMG GUIDE  04/15/2020  . MYOMECTOMY N/A 11/29/2016   Procedure: MYOMECTOMY;  Surgeon: Waymon Amato, MD;  Location: [facility];  Service: Gynecology;  Laterality: N/A;    SOCIAL HISTORY: Social History   Socioeconomic History  . Marital status: Married    Spouse name: Not on file  . Number of children: Not on file  . Years of education: Not on file  . Highest education level: Not on file  Occupational History  . Not on file  Tobacco Use  . Smoking status: Never Smoker  . Smokeless tobacco: Never Used  Substance and Sexual Activity  . Alcohol use: Yes    Comment: occ  . Drug use: No  . Sexual activity: Yes  Other Topics Concern  . Not on file  Social History Narrative  . Not on file   Social Determinants of Health   Financial Resource Strain:   . Difficulty of Paying Living Expenses: Not on file  Food Insecurity:   . Worried About Charity fundraiser in the Last Year: Not on file  . Ran Out of Food in the Last Year: Not on file  Transportation Needs:   .  Lack of Transportation (Medical): Not on file  . Lack of Transportation (Non-Medical): Not on file  Physical Activity:   . Days of Exercise per Week: Not on file  . Minutes of Exercise per Session: Not on file  Stress:   . Feeling of Stress : Not on file  Social Connections:   . Frequency of Communication with Friends and Family: Not on file  . Frequency of Social Gatherings with Friends and Family: Not on file  . Attends Religious Services: Not on file  . Active Member of Clubs or Organizations: Not on file  . Attends Archivist Meetings: Not on file  . Marital Status: Not on file    Intimate Partner Violence:   . Fear of Current or Ex-Partner: Not on file  . Emotionally Abused: Not on file  . Physically Abused: Not on file  . Sexually Abused: Not on file    FAMILY HISTORY: Family History  Problem Relation Age of Onset  . Diabetes Mother   . Hypertension Mother     ALLERGIES:  has No Known Allergies.  MEDICATIONS:  Scheduled Meds: . acyclovir  400 mg Oral BID  . bictegravir-emtricitabine-tenofovir AF  1 tablet Oral Q breakfast  . Chlorhexidine Gluconate Cloth  6 each Topical Daily  . cholecalciferol  1,000 Units Oral QHS  . DOXOrubicin/vinCRIStine/etoposide CHEMO IV infusion for Inpatient CI   Intravenous Once  . DOXOrubicin/vinCRIStine/etoposide CHEMO IV infusion for Inpatient CI   Intravenous Once  . feeding supplement (ENSURE ENLIVE)  237 mL Oral BID BM  . insulin aspart  0-5 Units Subcutaneous QHS  . insulin aspart  0-9 Units Subcutaneous TID WC  . multivitamin with minerals  1 tablet Oral QHS  . polyethylene glycol  17 g Oral Daily  . predniSONE  60 mg Oral QAC breakfast  . sodium bicarbonate/sodium chloride  1 application Mouth Rinse QID  . sodium chloride flush  10-40 mL Intracatheter Q12H   Continuous Infusions: . sodium chloride 20 mL/hr at 08/02/20 0818  . ondansetron (ZOFRAN) with dexamethasone (DECADRON) IV     PRN Meds:.acetaminophen, HYDROcodone-acetaminophen, ibuprofen, lidocaine-prilocaine, LORazepam, morphine injection, ondansetron, prochlorperazine, senna-docusate, sodium chloride flush  REVIEW OF SYSTEMS:    10 Point review of Systems was done is negative except as noted above.   LABORATORY DATA:  I have reviewed the data as listed  . CBC Latest Ref Rng & Units 07/13/2020 07/12/2020  WBC 4.0 - 10.5 K/uL 6.1 5.4  Hemoglobin 12.0 - 15.0 g/dL 10.9(L) 10.0(L)  Hematocrit 36 - 46 % 34.7(L) 32.1(L)  Platelets 150 - 400 K/uL 205 228    . CMP Latest Ref Rng & Units 07/13/2020 07/12/2020  Glucose 70 - 99 mg/dL 277(H) 120(H)  BUN 6 -  20 mg/dL 14 15  Creatinine 0.44 - 1.00 mg/dL 0.54 0.50  Sodium 135 - 145 mmol/L 137 139  Potassium 3.5 - 5.1 mmol/L 4.3 3.7  Chloride 98 - 111 mmol/L 106 106  CO2 22 - 32 mmol/L 24 25  Calcium 8.9 - 10.3 mg/dL 9.8 8.7(L)  Total Protein 6.5 - 8.1 g/dL 6.8 6.2(L)  Total Bilirubin 0.3 - 1.2 mg/dL 0.3 0.4  Alkaline Phos 38 - 126 U/L 69 64  AST 15 - 41 U/L 25 21  ALT 0 - 44 U/L 24 20     RADIOGRAPHIC STUDIES: I have personally reviewed the radiological images as listed and agreed with the findings in the report. DG Chest Port 1 View  Result Date: 07/15/2020 CLINICAL DATA:  Left pleural effusion. EXAM: PORTABLE CHEST 1 VIEW COMPARISON:  05/12/2020 chest radiograph and prior. FINDINGS: Accessed right chest wall Port-A-Cath tip in the superior right atrium. Cardiomediastinal silhouette within normal limits. Clear right lung. Left basilar atelectasis. Trace left pleural effusion, decreased from prior exam. No pneumothorax. No acute osseous abnormalities. IMPRESSION: Trace left pleural effusion, decreased from prior exam. Left basilar atelectasis. Electronically Signed   By: Primitivo Gauze M.D.   On: 07/15/2020 10:11   DG FLUORO GUIDED LOC OF NEEDLE/CATH TIP FOR SPINAL INJECT RT  Result Date: 08/02/2020 Felipa Emory, MD     08/02/2020  2:26 PM The L3-L4 interspace was accessed after sterile prep and drape and local anesthesia with a 20 gauge spinal needle without difficulty. After documenting flow of clear CSF methotrexate/ solumedrol was injected through the spinal needle without difficulty.    DG FLUORO GUIDE LUMBAR PUNCTURE  Result Date: 08/02/2020 CLINICAL DATA:  Intrathecal methotrexate injection EXAM: DIAGNOSTIC LUMBAR PUNCTURE UNDER FLUOROSCOPIC GUIDANCE FLUOROSCOPY TIME:  Fluoroscopy Time:  30 seconds Radiation Exposure Index (if provided by the fluoroscopic device): 2.2 mGy Number of Acquired Spot Images: 0 PROCEDURE: Informed consent was obtained from the patient prior to the  procedure, including potential complications of headache, allergy, and pain. With the patient prone, the lower back was prepped with Betadine. 1% Lidocaine was used for local anesthesia. Lumbar puncture was performed at the L3-L4 level using a 20 gauge needle with return of 2.5 cc of clear CSF was withdrawn for flushing of tubing during administration of intrathecal 12 mg methotrexate and 50 mg Solu-Cortef. After confirmation of flow of clear CSF the intrathecal chemotherapy was injected without difficulty. The patient tolerated the procedure well and there were no apparent complications. IMPRESSION: 1. Lumbar puncture for intrathecal chemotherapy as detailed above. Electronically Signed   By: Zetta Bills M.D.   On: 08/02/2020 14:59   DG FLUORO GUIDE LUMBAR PUNCTURE  Result Date: 07/13/2020 CLINICAL DATA:  44 year old female with history of large B-cell lymphoma. EXAM: DIAGNOSTIC LUMBAR PUNCTURE UNDER FLUOROSCOPIC GUIDANCE FLUOROSCOPY TIME:  Fluoroscopy Time:  41 seconds PROCEDURE: Informed consent was obtained from the patient prior to the procedure, including potential complications of headache, allergy, and pain. With the patient prone, the lower back was prepped with Betadine. 1% Lidocaine was used for local anesthesia. Lumbar puncture was performed at the L2-L3 level using a 20 gauge needle with return of clear CSF. 12 mg of methotrexate was injected intrathecally. The patient tolerated the procedure well and there were no apparent complications. IMPRESSION: 1. Successful uncomplicated fluoroscopic guided lumbar puncture for intrathecal chemotherapy injection, as above. Electronically Signed   By: Vinnie Langton M.D.   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 Hemoglobin C], childhood malaria. Patient notes that she had a CT of the abdomen sometime in 2020 that showed no acute pathology.This was not [facility] Not accessible to [place] at this point. She did have an MRI of the abdomen in July 2019 which showed indeterminate splenic lesions.No other acute abdominal pathology.No concern with hepatocellular carcinoma.  Patient is presenting now with  #1Pancreatic massalong with retroperitoneal lymphadenopathy and diffuse splenic lesions.due to diffuse large b cell lymphoma No internal necrosis within the mass noted. Diffuse splenomegaly. Left  periaortic lymph node causing mild displacement of the third portion of the duodenum.  Significantly elevated LDH levels. 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  PET scan on 06/11/2020 following 3 cycles of chemotherapy showed "1. Marked reduction in size and metabolic activity of splenic mass. Residual mass has mild metabolic activity slightly greater than liver activity ( [place] 4). 2. No new metastatic disease. 3. Diffuse marrow activity related to GCSF type response."  # 2 Left sided large pleural effusion and lung atelectasis. S/p diagnostic and therapeutic thoracentesis -- lymphocytic predominance @ 80%  HIV could be risk factors for high grade EBV driven lymphomas  Patient symptomatology has developed rather quickly over the last few weeks.  #63microcytic anemia-chronic.Some element of iron deficiency versus hemoglobinopathy versus anemia of chronic disease related to malignancy. Patient has received 1 dose of IV Feraheme on 04/08/2020 and repeated on 04/22/2020  #4history of HIV/AIDS follows with Dr. Melynda Keller and at [facility].   PLAN: -CBC with differential and CMET from today and are stable.  Okay to proceed with chemotherapy  today as planned. -Check daily CBC with differential and CMET -Continue as needed antiemetics. -Continue MiraLAX and as needed Senokot-S. -Will need to be cautious with rituximab to prevent recurrent hepatitis B flare.  Repeat HBV DNA undetectable on 06/02/2020. -continue with Biktarvy - no interactions - this was confirmed with patient ID physician and our pharmacy. -Continue Acyclovir prophylaxis.  -Continue CBGs before meals and at bedtime with sliding scale insulin.  The patient will follow up on 08/08/2020 for Rituxan and Neulasta and will have a follow-up visit and lab work performed on 09/06/2020.  Mikey Bussing, DNP, AGPCNP-BC, AOCNP Mon/Tues/Thurs/Fri 7am-5pm; Off Wednesdays Cell: [phone]    ADDENDUM  .Patient was Personally and independently interviewed, examined and relevant elements of the history of present illness were reviewed in details and an assessment and plan was created. All elements of the patient's history of present illness , assessment and plan were discussed in details with Mikey Bussing, DNP, AGPCNP-BC, AOCNP. The above documentation reflects our combined findings assessment and plan.  Sullivan Lone MD MS

## 2020-08-04 NOTE — Telephone Encounter (Signed)
Scheduled per 09/21 los, patient has been called and notified.

## 2020-08-05 ENCOUNTER — Other Ambulatory Visit: Payer: Self-pay

## 2020-08-05 DIAGNOSIS — C8338 Diffuse large B-cell lymphoma, lymph nodes of multiple sites: Secondary | ICD-10-CM

## 2020-08-05 LAB — COMPREHENSIVE METABOLIC PANEL
ALT: 20 U/L (ref 0–44)
AST: 17 U/L (ref 15–41)
Albumin: 3.5 g/dL (ref 3.5–5.0)
Alkaline Phosphatase: 60 U/L (ref 38–126)
Anion gap: 11 (ref 5–15)
BUN: 15 mg/dL (ref 6–20)
CO2: 27 mmol/L (ref 22–32)
Calcium: 9.3 mg/dL (ref 8.9–10.3)
Chloride: 98 mmol/L (ref 98–111)
Creatinine, Ser: 0.56 mg/dL (ref 0.44–1.00)
GFR calc Af Amer: >60 mL/min (ref >60)
GFR calc non Af Amer: >60 mL/min (ref >60)
Glucose, Bld: 166 mg/dL — ABNORMAL HIGH (ref 70–99)
Potassium: 3.5 mmol/L (ref 3.5–5.1)
Sodium: 136 mmol/L (ref 135–145)
Total Bilirubin: 0.7 mg/dL (ref 0.3–1.2)
Total Protein: 6.5 g/dL (ref 6.5–8.1)

## 2020-08-05 LAB — CBC WITH DIFFERENTIAL/PLATELET
Abs Immature Granulocytes: 0.24 10*3/uL — ABNORMAL HIGH (ref 0.00–0.07)
Basophils Absolute: 0 10*3/uL (ref 0.0–0.1)
Basophils Relative: 1 %
Eosinophils Absolute: 0 10*3/uL (ref 0.0–0.5)
Eosinophils Relative: 0 %
HCT: 31 % — ABNORMAL LOW (ref 36.0–46.0)
Hemoglobin: 9.8 g/dL — ABNORMAL LOW (ref 12.0–15.0)
Immature Granulocytes: 7 %
Lymphocytes Relative: 6 %
Lymphs Abs: 0.2 10*3/uL — ABNORMAL LOW (ref 0.7–4.0)
MCH: 27.5 pg (ref 26.0–34.0)
MCHC: 31.6 g/dL (ref 30.0–36.0)
MCV: 87.1 fL (ref 80.0–100.0)
Monocytes Absolute: 0.1 10*3/uL (ref 0.1–1.0)
Monocytes Relative: 2 %
Neutro Abs: 3.1 10*3/uL (ref 1.7–7.7)
Neutrophils Relative %: 84 %
Platelets: 291 10*3/uL (ref 150–400)
RBC: 3.56 MIL/uL — ABNORMAL LOW (ref 3.87–5.11)
RDW: 20.5 % — ABNORMAL HIGH (ref 11.5–15.5)
WBC: 3.6 10*3/uL — ABNORMAL LOW (ref 4.0–10.5)
nRBC: 0.8 % — ABNORMAL HIGH (ref 0.0–0.2)

## 2020-08-05 LAB — GLUCOSE, CAPILLARY
Glucose-Capillary: 131 mg/dL — ABNORMAL HIGH (ref 70–99)
Glucose-Capillary: 208 mg/dL — ABNORMAL HIGH (ref 70–99)

## 2020-08-05 MED ORDER — SODIUM CHLORIDE 0.9 % IV SOLN
Freq: Once | INTRAVENOUS | Status: AC
  Administered 2020-08-05: 16 mg via INTRAVENOUS
  Filled 2020-08-05: qty 8

## 2020-08-05 MED ORDER — HEPARIN SOD (PORK) LOCK FLUSH 100 UNIT/ML IV SOLN
500.0000 [IU] | INTRAVENOUS | Status: AC | PRN
  Administered 2020-08-05: 500 [IU]

## 2020-08-05 MED ORDER — SODIUM CHLORIDE 0.9 % IV SOLN
750.0000 mg/m2 | Freq: Once | INTRAVENOUS | Status: AC
  Administered 2020-08-05: 1180 mg via INTRAVENOUS
  Filled 2020-08-05: qty 59

## 2020-08-05 NOTE — Discharge Summary (Addendum)
.  [place]  Telephone:([phone]) [phone] Fax:(336) [phone]    Physician Discharge Summary     Patient ID: Heather [name] MRN: [id number] DOB/AGE: 43-Oct-1977 43 y.o.  Admit date: 08/01/2020 Discharge date: 08/05/2020  Primary Care Physician:  [name], MD   Discharge Diagnoses:    Present on Admission: . Diffuse large B-cell lymphoma of lymph nodes of multiple regions [facility]) . 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   acyclovir 400 MG tablet Commonly known as: ZOVIRAX Take 1 tablet (400 mg total) by mouth 2 (two) times daily.   B COMPLEX PO Take 1 tablet by mouth daily.   Biktarvy 50-200-25 MG Tabs tablet Generic drug: bictegravir-emtricitabine-tenofovir AF Take 1 tablet by mouth daily with breakfast.   cholecalciferol 25 MCG (1000 UNIT) tablet Commonly known as: VITAMIN D3 Take 1 tablet (1,000 Units total) by mouth at bedtime.   feeding supplement (ENSURE ENLIVE) Liqd Take 237 mLs by mouth 2 (two) times daily between meals.   HYDROcodone-acetaminophen 5-325 MG tablet Commonly known as: Norco Take 1 tablet by mouth every 6 (six) hours as needed for moderate pain.   ibuprofen 200 MG tablet Commonly known as: ADVIL Take 2 tablets (400 mg total) by mouth every 6 (six) hours as needed for mild pain.   lidocaine-prilocaine cream Commonly known as: EMLA Apply to affected area once What changed:   how much to take  how to take this  when to take this  reasons to take this   LORazepam 0.5 MG tablet Commonly known as:  ATIVAN Take 1 tablet (0.5 mg total) by mouth every 6 (six) hours as needed (for chemo induced nausea or vomiting).   multivitamin with minerals Tabs tablet Take 1 tablet by mouth at bedtime.   ondansetron 8 MG tablet Commonly known as: ZOFRAN Take 1 tablet (8 mg total) by mouth every 8 (eight) hours as needed for nausea or vomiting.   polyethylene glycol 17 g packet Commonly known as: MIRALAX / GLYCOLAX Take 17 g by mouth daily as needed for mild constipation.   prochlorperazine 10 MG tablet Commonly known as: COMPAZINE Take 1 tablet (10 mg total) by mouth every 6 (six) hours as needed for nausea or vomiting.   senna-docusate 8.6-50 MG tablet Commonly known as: Senokot-S Take 2 tablets by mouth at bedtime as needed for mild constipation.   sodium bicarbonate/sodium chloride Soln 1 application by Mouth Rinse route 4 (four) times daily.        Disposition and Follow-up:   Significant Diagnostic Studies:  DG Chest Port 1 View  Result Date: 07/15/2020 CLINICAL DATA:  Left pleural effusion. EXAM: PORTABLE CHEST 1 VIEW COMPARISON:  05/12/2020 chest radiograph and prior. FINDINGS: Accessed right chest wall Port-A-Cath tip in the superior right atrium. Cardiomediastinal silhouette within normal limits. Clear right lung. Left basilar atelectasis. Trace left pleural effusion, decreased from prior exam. No pneumothorax. No acute osseous abnormalities. IMPRESSION: Trace left pleural effusion, decreased from prior exam. Left basilar atelectasis. Electronically Signed   By: Primitivo Gauze M.D.   On: 07/15/2020  10:11   DG FLUORO GUIDED LOC OF NEEDLE/CATH TIP FOR SPINAL INJECT RT  Result Date: 08/02/2020 Felipa Emory, MD     08/02/2020  2:26 PM The L3-L4 interspace was accessed after sterile prep and drape and local anesthesia with a 20 gauge spinal needle without difficulty. After documenting flow of clear CSF methotrexate/ solumedrol was injected through the spinal needle without  difficulty.    DG FLUORO GUIDE LUMBAR PUNCTURE  Result Date: 08/02/2020 CLINICAL DATA:  Intrathecal methotrexate injection EXAM: DIAGNOSTIC LUMBAR PUNCTURE UNDER FLUOROSCOPIC GUIDANCE FLUOROSCOPY TIME:  Fluoroscopy Time:  30 seconds Radiation Exposure Index (if provided by the fluoroscopic device): 2.2 mGy Number of Acquired Spot Images: 0 PROCEDURE: Informed consent was obtained from the patient prior to the procedure, including potential complications of headache, allergy, and pain. With the patient prone, the lower back was prepped with Betadine. 1% Lidocaine was used for local anesthesia. Lumbar puncture was performed at the L3-L4 level using a 20 gauge needle with return of 2.5 cc of clear CSF was withdrawn for flushing of tubing during administration of intrathecal 12 mg methotrexate and 50 mg Solu-Cortef. After confirmation of flow of clear CSF the intrathecal chemotherapy was injected without difficulty. The patient tolerated the procedure well and there were no apparent complications. IMPRESSION: 1. Lumbar puncture for intrathecal chemotherapy as detailed above. Electronically Signed   By: Zetta Bills M.D.   On: 08/02/2020 14:59   DG FLUORO GUIDE LUMBAR PUNCTURE  Result Date: 07/13/2020 CLINICAL DATA:  44 year old female with history of large B-cell lymphoma. EXAM: DIAGNOSTIC LUMBAR PUNCTURE UNDER FLUOROSCOPIC GUIDANCE FLUOROSCOPY TIME:  Fluoroscopy Time:  41 seconds PROCEDURE: Informed consent was obtained from the patient prior to the procedure, including potential complications of headache, allergy, and pain. With the patient prone, the lower back was prepped with Betadine. 1% Lidocaine was used for local anesthesia. Lumbar puncture was performed at the L2-L3 level using a 20 gauge needle with return of clear CSF. 12 mg of methotrexate was injected intrathecally. The patient tolerated the procedure well and there were no apparent complications. IMPRESSION: 1. Successful uncomplicated  fluoroscopic guided lumbar puncture for intrathecal chemotherapy injection, as above. Electronically Signed   By: Vinnie Langton M.D.   On: 07/13/2020 15:20    Discharge Laboratory Values: . CBC Latest Ref Rng & Units 08/05/2020 08/04/2020 08/03/2020  WBC 4.0 - 10.5 K/uL 3.6(L) 6.3 6.0  Hemoglobin 12.0 - 15.0 g/dL 9.8(L) 10.6(L) 9.9(L)  Hematocrit 36 - 46 % 31.0(L) 33.0(L) 31.2(L)  Platelets 150 - 400 K/uL 291 293 303    . CMP Latest Ref Rng & Units 08/05/2020 08/04/2020 08/03/2020  Glucose 70 - 99 mg/dL 166(H) 196(H) 246(H)  BUN 6 - 20 mg/dL 15 14 12   Creatinine 0.44 - 1.00 mg/dL 0.56 0.55 0.58  Sodium 135 - 145 mmol/L 136 138 141  Potassium 3.5 - 5.1 mmol/L 3.5 3.7 4.0  Chloride 98 - 111 mmol/L 98 102 105  CO2 22 - 32 mmol/L 27 26 26   Calcium 8.9 - 10.3 mg/dL 9.3 10.1 9.7  Total Protein 6.5 - 8.1 g/dL 6.5 7.1 6.7  Total Bilirubin 0.3 - 1.2 mg/dL 0.7 0.5 0.2(L)  Alkaline Phos 38 - 126 U/L 60 70 60  AST 15 - 41 U/L 17 19 18   ALT 0 - 44 U/L 20 20 18      Brief H and P: For complete details please refer to admission H and P, but in brief, Ms. Newsham is a wonderful 44 y.o. female with  a past medical history significant for hepatitis B, HIV, chronic anemia who presented to [facility] with abdominal pain. She had a 2-week history of early satiety, poor appetite and, 5 pound weight loss. Abdominal pain is typically postprandial and mainly at night after dinner while laying in bed. Has been associated with nausea improves after vomiting. Labs on admission showed a hemoglobin of 9.3, MCV 67.4, platelet count 554,000. Lipase was mildly elevated at 123. 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  During her initial consult, she was having ongoingpain is primarily in her epigastric area and radiates to the left side of her abdomen. States pain is worse when laying down after she eats. She states that she has had this pain for at least 2 weeks. Her abdominal pain is worse after eating. She has had some nausea and intermittent vomiting. Vomiting makes her pain better. She was attributing her symptoms to some the medications that she was taking for fertility treatments. She reports a poor appetite and about a 5 pound weight loss. She is not having any fevers or chills. Denies night sweats. She has not noticed any palpable lymphadenopathy. The patient underwent a biopsy of her pancreatic mass on 04/08/2020 ([id number]) with results feeling diffuse large B-cell lymphoma.  It was recommended for her to undergo systemic chemotherapy with EPOCH-R.    The patient felt well on the day of admission.  She was not having any mucositis, nausea, vomiting, abdominal pain.  She denied chest pain and shortness of breath.  She had a good appetite.  She had recently completed a 7-day course of Augmentin for a sinus infection.  She was not having any fevers or chills.  She was admitted for cycle #6 of EPOCH-R.  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 Hemoglobin C], childhood malaria. Patient notes that she had a CT of the abdomen sometime in 2020 that showed no acute pathology.This was not [facility]. Not accessible to [place] at this point. She did have an MRI of the abdomen in July 2019  which showed indeterminate splenic lesions.No other acute abdominal pathology.No concern with hepatocellular carcinoma.  Patient is presenting now with  #1Pancreatic massalong with retroperitoneal lymphadenopathy and diffuse splenic lesions.due to diffuse large b cell lymphoma No internal necrosis within the mass noted. Diffuse splenomegaly. Left periaortic lymph node causing mild displacement of the third portion of the duodenum.  Significantly elevated LDH levels. 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  PET scan on 06/11/2020 following 3 cycles of chemotherapy showed "1. Marked reduction in size and metabolic activity of splenic mass. Residual mass has mild metabolic activity slightly greater than liver activity ( [place] 4). 2. No new metastatic disease. 3. Diffuse marrow activity related to GCSF type response."  # 2 Left sided large pleural effusion and lung atelectasis. S/p diagnostic and therapeutic thoracentesis -- lymphocytic predominance @ 80%  HIV  could be risk factors for high grade EBV driven lymphomas  Patient symptomatology has developed rather quickly over the last few weeks.  #94microcytic anemia-chronic.Some element of iron deficiency versus hemoglobinopathy versus anemia of chronic disease related to malignancy. Patient has received 1 dose of IV Feraheme on 04/08/2020 and repeated on 04/22/2020  #4history of HIV/AIDS follows with Dr. Melynda Keller and at [facility].   PLAN: -labs stable. Patient tolerated C6 of EPOCH and 3rd dose of IT MTX without any prohibitive toxicities. -Continue as needed antiemetics. -ContinueMiraLAX andPRNSenokot-S have been ordered. -Will need to be cautious with rituximab to prevent recurrent hepatitis B flare. Repeat HBV DNA undetectable on 06/02/2020. -continue with Biktarvy - no interactions - this was confirmed with  patient ID physician and our pharmacy. -Continue Acyclovir prophylaxis. -Discussed hyperglycemia noted on labs this morning.  Hyperglycemia likely due to steroids.  I anticipate that her blood sugars will correct when she is home and off the steroids.   -Discharge to home today once chemotherapy complete  The patient will follow up at the cancer center on 08/08/2020 for Rituxan and Neulasta and will have a follow-up visit on that same date.   Physical Exam at Discharge: BP 118/80   Pulse (!) 56   Temp 97.7 F (36.5 C) (Oral)   Resp 18   Ht 5' (1.524 m)   Wt 64.1 kg   SpO2 100%   BMI 27.62 kg/m  . GENERAL:alert, in no acute distress and comfortable SKIN: no acute rashes, no significant lesions EYES: conjunctiva are pink and non-injected, sclera anicteric OROPHARYNX: MMM, no exudates, no oropharyngeal erythema or ulceration NECK: supple, no JVD LYMPH:  no palpable lymphadenopathy in the cervical, axillary or inguinal regions LUNGS: clear to auscultation b/l with normal respiratory effort HEART: regular rate & rhythm ABDOMEN:  normoactive bowel sounds , non tender, not distended. 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 All elements of the patient's discharge plan were discussed in details with Mikey Bussing, DNP, AGPCNP-BC, AOCNP. The above  documentation reflects our combined findings assessment and plan.  Sullivan Lone MD MS  TT >35mins

## 2020-08-05 NOTE — Progress Notes (Signed)
Cyclophosphamide dosage, total VTBI, expiration date/time, 2 pharmacy signatures, and patient identifiers independently verified by me and by Aldean Baker, RN. Zandra Abts [facility]  08/05/2020

## 2020-08-08 ENCOUNTER — Other Ambulatory Visit: Payer: Self-pay

## 2020-08-08 ENCOUNTER — Inpatient Hospital Stay: Payer: 59 | Admitting: Hematology

## 2020-08-08 ENCOUNTER — Inpatient Hospital Stay: Payer: 59

## 2020-08-08 ENCOUNTER — Inpatient Hospital Stay: Payer: 59 | Attending: Hematology

## 2020-08-08 VITALS — BP 106/73 | HR 73 | Temp 97.9°F | Resp 16

## 2020-08-08 DIAGNOSIS — Z5112 Encounter for antineoplastic immunotherapy: Secondary | ICD-10-CM | POA: Insufficient documentation

## 2020-08-08 DIAGNOSIS — C8338 Diffuse large B-cell lymphoma, lymph nodes of multiple sites: Secondary | ICD-10-CM | POA: Insufficient documentation

## 2020-08-08 DIAGNOSIS — Z5189 Encounter for other specified aftercare: Secondary | ICD-10-CM | POA: Insufficient documentation

## 2020-08-08 DIAGNOSIS — Z7189 Other specified counseling: Secondary | ICD-10-CM

## 2020-08-08 MED ORDER — ACETAMINOPHEN 325 MG PO TABS
650.0000 mg | ORAL_TABLET | Freq: Once | ORAL | Status: AC
  Administered 2020-08-08: 650 mg via ORAL

## 2020-08-08 MED ORDER — SODIUM CHLORIDE 0.9 % IV SOLN
375.0000 mg/m2 | Freq: Once | INTRAVENOUS | Status: AC
  Administered 2020-08-08: 600 mg via INTRAVENOUS
  Filled 2020-08-08: qty 50

## 2020-08-08 MED ORDER — ACETAMINOPHEN 325 MG PO TABS
ORAL_TABLET | ORAL | Status: AC
  Filled 2020-08-08: qty 2

## 2020-08-08 MED ORDER — PEGFILGRASTIM-CBQV 6 MG/0.6ML ~~LOC~~ SOSY
PREFILLED_SYRINGE | SUBCUTANEOUS | Status: AC
  Filled 2020-08-08: qty 0.6

## 2020-08-08 MED ORDER — SODIUM CHLORIDE 0.9% FLUSH
10.0000 mL | INTRAVENOUS | Status: DC | PRN
  Administered 2020-08-08: 10 mL
  Filled 2020-08-08: qty 10

## 2020-08-08 MED ORDER — HYDROXYZINE HCL 25 MG PO TABS
25.0000 mg | ORAL_TABLET | Freq: Once | ORAL | Status: AC
  Administered 2020-08-08: 25 mg via ORAL
  Filled 2020-08-08: qty 1

## 2020-08-08 MED ORDER — PEGFILGRASTIM-CBQV 6 MG/0.6ML ~~LOC~~ SOSY
6.0000 mg | PREFILLED_SYRINGE | Freq: Once | SUBCUTANEOUS | Status: AC
  Administered 2020-08-08: 6 mg via SUBCUTANEOUS

## 2020-08-08 MED ORDER — HEPARIN SOD (PORK) LOCK FLUSH 100 UNIT/ML IV SOLN
500.0000 [IU] | Freq: Once | INTRAVENOUS | Status: AC | PRN
  Administered 2020-08-08: 500 [IU]
  Filled 2020-08-08: qty 5

## 2020-08-08 MED ORDER — SODIUM CHLORIDE 0.9 % IV SOLN
Freq: Once | INTRAVENOUS | Status: AC
  Filled 2020-08-08: qty 250

## 2020-08-08 NOTE — Patient Instructions (Signed)
[facility] °Discharge Instructions for Patients Receiving Chemotherapy ° °Today you received the following chemotherapy agents: Rituximab ° °To help prevent nausea and vomiting after your treatment, we encourage you to take your nausea medication as directed.  °  °If you develop nausea and vomiting that is not controlled by your nausea medication, call the clinic.  ° °BELOW ARE SYMPTOMS THAT SHOULD BE REPORTED IMMEDIATELY: °· *FEVER GREATER THAN 100.5 F °· *CHILLS WITH OR WITHOUT FEVER °· NAUSEA AND VOMITING THAT IS NOT CONTROLLED WITH YOUR NAUSEA MEDICATION °· *UNUSUAL SHORTNESS OF BREATH °· *UNUSUAL BRUISING OR BLEEDING °· TENDERNESS IN MOUTH AND THROAT WITH OR WITHOUT PRESENCE OF ULCERS °· *URINARY PROBLEMS °· *BOWEL PROBLEMS °· UNUSUAL RASH °Items with * indicate a potential emergency and should be followed up as soon as possible. ° °Feel free to call the clinic should you have any questions or concerns. The clinic phone number is [phone]. ° °Please show the CHEMO ALERT CARD at check-in to the Emergency Department and triage nurse. ° ° °

## 2020-08-09 ENCOUNTER — Other Ambulatory Visit: Payer: 59

## 2020-08-09 ENCOUNTER — Ambulatory Visit: Payer: 59

## 2020-08-09 ENCOUNTER — Ambulatory Visit: Payer: 59 | Admitting: Hematology

## 2020-08-12 ENCOUNTER — Telehealth: Payer: Self-pay | Admitting: Hematology

## 2020-08-12 ENCOUNTER — Encounter: Payer: Self-pay | Admitting: Hematology

## 2020-08-12 NOTE — Telephone Encounter (Signed)
Scheduled appt per 10/8 sch msg - pt is aware of appt date and time

## 2020-08-15 ENCOUNTER — Other Ambulatory Visit: Payer: 59

## 2020-08-15 ENCOUNTER — Ambulatory Visit: Payer: 59

## 2020-08-15 ENCOUNTER — Ambulatory Visit: Payer: 59 | Admitting: Hematology

## 2020-08-16 NOTE — Telephone Encounter (Signed)
Opened by accident. Please disregard.  °

## 2020-08-17 ENCOUNTER — Other Ambulatory Visit: Payer: Self-pay | Admitting: Hematology

## 2020-08-17 DIAGNOSIS — C8338 Diffuse large B-cell lymphoma, lymph nodes of multiple sites: Secondary | ICD-10-CM

## 2020-08-18 ENCOUNTER — Inpatient Hospital Stay (HOSPITAL_BASED_OUTPATIENT_CLINIC_OR_DEPARTMENT_OTHER): Payer: 59 | Admitting: Hematology

## 2020-08-18 ENCOUNTER — Inpatient Hospital Stay: Payer: 59

## 2020-08-18 ENCOUNTER — Telehealth: Payer: Self-pay | Admitting: *Deleted

## 2020-08-18 ENCOUNTER — Encounter: Payer: Self-pay | Admitting: *Deleted

## 2020-08-18 ENCOUNTER — Other Ambulatory Visit: Payer: Self-pay

## 2020-08-18 VITALS — BP 138/81 | HR 115 | Temp 97.8°F | Resp 18 | Ht 60.0 in | Wt 144.1 lb

## 2020-08-18 DIAGNOSIS — C8338 Diffuse large B-cell lymphoma, lymph nodes of multiple sites: Secondary | ICD-10-CM | POA: Diagnosis not present

## 2020-08-18 DIAGNOSIS — Z5112 Encounter for antineoplastic immunotherapy: Secondary | ICD-10-CM | POA: Diagnosis not present

## 2020-08-18 DIAGNOSIS — Z95828 Presence of other vascular implants and grafts: Secondary | ICD-10-CM

## 2020-08-18 LAB — CMP (CANCER CENTER ONLY)
ALT: 17 U/L (ref 0–44)
AST: 20 U/L (ref 15–41)
Albumin: 3.4 g/dL — ABNORMAL LOW (ref 3.5–5.0)
Alkaline Phosphatase: 121 U/L (ref 38–126)
Anion gap: 8 (ref 5–15)
BUN: 9 mg/dL (ref 6–20)
CO2: 28 mmol/L (ref 22–32)
Calcium: 9.9 mg/dL (ref 8.9–10.3)
Chloride: 105 mmol/L (ref 98–111)
Creatinine: 0.74 mg/dL (ref 0.44–1.00)
GFR, Estimated: >60 mL/min (ref >60)
Glucose, Bld: 196 mg/dL — ABNORMAL HIGH (ref 70–99)
Potassium: 3.8 mmol/L (ref 3.5–5.1)
Sodium: 141 mmol/L (ref 135–145)
Total Bilirubin: 0.3 mg/dL (ref 0.3–1.2)
Total Protein: 6.6 g/dL (ref 6.5–8.1)

## 2020-08-18 LAB — CBC WITH DIFFERENTIAL/PLATELET
Abs Immature Granulocytes: 4.29 10*3/uL — ABNORMAL HIGH (ref 0.00–0.07)
Basophils Absolute: 0.1 10*3/uL (ref 0.0–0.1)
Basophils Relative: 0 %
Eosinophils Absolute: 0.1 10*3/uL (ref 0.0–0.5)
Eosinophils Relative: 1 %
HCT: 29.8 % — ABNORMAL LOW (ref 36.0–46.0)
Hemoglobin: 9.5 g/dL — ABNORMAL LOW (ref 12.0–15.0)
Immature Granulocytes: 19 %
Lymphocytes Relative: 3 %
Lymphs Abs: 0.7 10*3/uL (ref 0.7–4.0)
MCH: 27.8 pg (ref 26.0–34.0)
MCHC: 31.9 g/dL (ref 30.0–36.0)
MCV: 87.1 fL (ref 80.0–100.0)
Monocytes Absolute: 1.6 10*3/uL — ABNORMAL HIGH (ref 0.1–1.0)
Monocytes Relative: 7 %
Neutro Abs: 15.8 10*3/uL — ABNORMAL HIGH (ref 1.7–7.7)
Neutrophils Relative %: 70 %
Platelets: 218 10*3/uL (ref 150–400)
RBC: 3.42 MIL/uL — ABNORMAL LOW (ref 3.87–5.11)
RDW: 20.9 % — ABNORMAL HIGH (ref 11.5–15.5)
WBC: 22.5 10*3/uL — ABNORMAL HIGH (ref 4.0–10.5)
nRBC: 1.3 % — ABNORMAL HIGH (ref 0.0–0.2)

## 2020-08-18 LAB — MAGNESIUM: Magnesium: 1.7 mg/dL (ref 1.7–2.4)

## 2020-08-18 MED ORDER — HEPARIN SOD (PORK) LOCK FLUSH 100 UNIT/ML IV SOLN
500.0000 [IU] | Freq: Once | INTRAVENOUS | Status: AC
  Administered 2020-08-18: 500 [IU]
  Filled 2020-08-18: qty 5

## 2020-08-18 MED ORDER — SODIUM CHLORIDE 0.9% FLUSH
10.0000 mL | Freq: Once | INTRAVENOUS | Status: AC
  Administered 2020-08-18: 10 mL
  Filled 2020-08-18: qty 10

## 2020-08-18 NOTE — Telephone Encounter (Signed)
Dr. Irene Limbo provided signed letter for patient stating she needs to be out of work for 3 months and further absence re-assessed at that time. Patient contacted to inform letter ready - requested to pick up letter. Signed letter placed at reception desk of [facility] for patient pick up.

## 2020-08-18 NOTE — Progress Notes (Signed)
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 female with a past medical history significant for hepatitis B, HIV, chronic anemia who presented to [facility] with abdominal pain.  She had a 2-week history of early satiety, poor appetite and, 5 pound weight loss.  Abdominal pain is typically postprandial and mainly at night after dinner while laying in bed.  Has been associated with nausea improves after vomiting.  Labs on admission showed a hemoglobin of 9.3, MCV 67.4, platelet count 554,000.  Lipase was mildly elevated at 123.  She had a CT of Heather abdomen pelvis performed on admission which showed extensive heterogeneous mass involving nearly Heather entirety of Heather pancreas likely consistent with primary pancreatic neoplasm versus lymphoma, extension into Heather splenic hilum with diffuse splenic metastases, retroperitoneal adenopathy consistent with metastatic disease, and diffuse wall thickening seen within Heather proximal stomach which could be related to gastritis versus possible metastatic disease.  Husband was at Heather bedside at Heather time of Heather visit.  Heather patient reports that she is having ongoing abdominal discomfort today.  Pain is primarily in her epigastric area and radiates to Heather left side of her abdomen.  States pain is worse when laying down after she eats.  She states that she has had this pain for at least 2 weeks.  Her abdominal pain is worse after eating.  She has had some nausea and intermittent vomiting.  Vomiting makes her pain better.  She was attributing her symptoms to some Heather medications that she was taking for fertility treatments.  She reports a poor appetite and about a 5 pound weight loss.  She is not having  any fevers or chills.  Denies night sweats.  She has not noticed any palpable lymphadenopathy.  She denies headaches, dizziness, chest pain, shortness of breath, diarrhea.  She reports intermittent constipation.  Denies bleeding or lower extremity edema.  Heather patient is married and has no children.  She currently works as an Therapist, sports.  Denies alcohol tobacco use.  Family history significant for paternal grandmother with uterine cancer.  Medical oncology was asked see Heather patient to make recommendations regarding her abnormal CT scan findings.  INTERVAL HISTORY: Heather [name] is a 44 year old female who is here for evaluation and management of Large B-cell lymphoma. We are joined today by her husband.  Heather patient's last visit with [place] was on 07/26/2020. Heather [name] reports that she is doing well overall.  Heather [name] reports that she is eating well and has started gaining weight. She feels that her neuropathy is about Heather same. [name] continues to report congestion. [name] is experiencing hot flashes around Heather clock, but denies any constitutional symptoms.   Lab results today (08/18/20) of CBC w/diff is as follows: all values are WNL except for WBC at 22.5K, RBC at 3.42, Hgb at 9.5, HCT at 29.8, RDW at 20.9, nRBC at 1.3, Neutro Abs at 15.8K, Mono Abs at 1.6K, Abs Immature Granulocytes at 4.29K, Tear Drop Cells are "Present', Polychromasia is "Present".  On review of systems, [name] reports hot flashes, headaches, congestion and denies constipation, leg swelling, mouth sores, fevers, chills, night sweats, worsening neuropathy, new bone pain and any other symptoms.    MEDICAL HISTORY:  Past Medical History:  Diagnosis Date  . Anemia   . Diffuse large B-cell lymphoma of lymph nodes of multiple regions ([place]) 04/12/2020  . 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 History of blood transfusion    childhood  . HIV (human immunodeficiency virus infection) ([place])   . Immune deficiency disorder [facility])     SURGICAL HISTORY: Past  Surgical History:  Procedure Laterality Date  . CHROMOPERTUBATION Bilateral 11/29/2016   Procedure: CHROMOPERTUBATION;  Surgeon: Waymon Amato, MD;  Location: [facility];  Service: Gynecology;  Laterality: Bilateral;  fallopian tubes  . ENDOMETRIAL BIOPSY    . IR IMAGING GUIDED PORT INSERTION  04/15/2020  . IR THORACENTESIS ASP PLEURAL SPACE W/IMG GUIDE  04/15/2020  . MYOMECTOMY N/A 11/29/2016   Procedure: MYOMECTOMY;  Surgeon: Waymon Amato, MD;  Location: [facility];  Service: Gynecology;  Laterality: N/A;    SOCIAL HISTORY: Social History   Socioeconomic History  . Marital status: Married    Spouse name: Not on file  . Number of children: Not on file  . Years of education: Not on file  . Highest education level: Not on file  Occupational History  . Not on file  Tobacco Use  . Smoking status: Never Smoker  . Smokeless tobacco: Never Used  Substance and Sexual Activity  . Alcohol use: Yes    Comment: occ  . Drug use: No  . Sexual activity: Yes  Other Topics Concern  . Not on file  Social History Narrative  . Not on file   Social Determinants of Health   Financial Resource Strain:   . Difficulty of Paying Living Expenses: Not on file  Food Insecurity:   . Worried About Charity fundraiser in Heather Last Year: Not on file  . Ran Out of Food in Heather Last Year: Not on file  Transportation Needs:   . Lack of Transportation (Medical): Not on file  . Lack of Transportation (Non-Medical): Not on file  Physical Activity:   . Days of Exercise per Week: Not on file  . Minutes of Exercise per Session: Not on file  Stress:   . Feeling of Stress : Not on file  Social Connections:   . Frequency of Communication with Friends and Family: Not on file  . Frequency of Social Gatherings with Friends and Family: Not on file  . Attends Religious Services: Not on file  . Active Member of Clubs or Organizations: Not on file  . Attends Archivist Meetings: Not on file  . Marital Status: Not on file    Intimate Partner Violence:   . Fear of Current or Ex-Partner: Not on file  . Emotionally Abused: Not on file  . Physically Abused: Not on file  . Sexually Abused: Not on file    FAMILY HISTORY: Family History  Problem Relation Age of Onset  . Diabetes Mother   . Hypertension Mother     ALLERGIES:  has No Known Allergies.  MEDICATIONS:  Current Outpatient Medications  Medication Sig Dispense Refill  . acetaminophen (TYLENOL) 500 MG tablet Take 500 mg by mouth every 6 (six) hours as needed for mild pain or headache.    Marland Kitchen acyclovir (ZOVIRAX) 400 MG tablet Take 1 tablet (400 mg total) by mouth 2 (two) times daily. 60 tablet 6  . B Complex Vitamins (B COMPLEX PO) Take 1 tablet by mouth daily.    . bictegravir-emtricitabine-tenofovir AF (BIKTARVY) 50-200-25 MG TABS tablet Take 1 tablet by mouth daily with breakfast.     .  cholecalciferol (VITAMIN D3) 25 MCG (1000 UNIT) tablet Take 1 tablet (1,000 Units total) by mouth at bedtime.    . feeding supplement, ENSURE ENLIVE, (ENSURE ENLIVE) LIQD Take 237 mLs by mouth 2 (two) times daily between meals. 237 mL 12  . HYDROcodone-acetaminophen (NORCO) 5-325 MG tablet Take 1 tablet by mouth every 6 (six) hours as needed for moderate pain. 30 tablet 0  . ibuprofen (ADVIL) 200 MG tablet Take 2 tablets (400 mg total) by mouth every 6 (six) hours as needed for mild pain. (Patient not taking: Reported on 06/20/2020) 30 tablet 0  . lidocaine-prilocaine (EMLA) cream Apply to affected area once (Patient taking differently: Apply 1 application topically as needed (port access). Apply to affected area once) 30 g 3  . LORazepam (ATIVAN) 0.5 MG tablet Take 1 tablet (0.5 mg total) by mouth every 6 (six) hours as needed (for chemo induced nausea or vomiting). 30 tablet 0  . Multiple Vitamin (MULTIVITAMIN WITH MINERALS) TABS tablet Take 1 tablet by mouth at bedtime.     . ondansetron (ZOFRAN) 8 MG tablet Take 1 tablet (8 mg total) by mouth every 8 (eight) hours as  needed for nausea or vomiting. 30 tablet 1  . polyethylene glycol (MIRALAX / GLYCOLAX) 17 g packet Take 17 g by mouth daily as needed for mild constipation. 14 each 0  . prochlorperazine (COMPAZINE) 10 MG tablet Take 1 tablet (10 mg total) by mouth every 6 (six) hours as needed for nausea or vomiting. 30 tablet 1  . senna-docusate (SENOKOT-S) 8.6-50 MG tablet Take 2 tablets by mouth at bedtime as needed for mild constipation.    . Sodium Chloride-Sodium Bicarb (SODIUM BICARBONATE/SODIUM CHLORIDE) SOLN 1 application by Mouth Rinse route 4 (four) times daily.     No current facility-administered medications for this visit.    REVIEW OF SYSTEMS:   A 10+ POINT REVIEW OF SYSTEMS WAS OBTAINED including neurology, dermatology, psychiatry, cardiac, respiratory, lymph, extremities, GI, GU, Musculoskeletal, constitutional, breasts, reproductive, HEENT.  All pertinent positives are noted in Heather HPI.  All others are negative.   PHYSICAL EXAMINATION: ECOG PERFORMANCE STATUS: 1 - Symptomatic but completely ambulatory  . Vitals:   08/18/20 1349  BP: 138/81  Pulse: (!) 115  Resp: 18  Temp: 97.8 F (36.6 C)  SpO2: 100%   Filed Weights   08/18/20 1349  Weight: 144 lb 1.6 oz (65.4 kg)   .Body mass index is 28.14 kg/m.   NAD  GENERAL:alert, in no acute distress and comfortable SKIN: no acute rashes, no significant lesions EYES: conjunctiva are pink and non-injected, sclera anicteric OROPHARYNX: MMM, no exudates, no oropharyngeal erythema or ulceration NECK: supple, no JVD LYMPH:  no palpable lymphadenopathy in Heather cervical, axillary or inguinal regions LUNGS: clear to auscultation b/l with normal respiratory effort HEART: regular rate & rhythm ABDOMEN:  normoactive bowel sounds , non tender, not distended. No palpable hepatosplenomegaly.  Extremity: no pedal edema PSYCH: alert & oriented x 3 with fluent speech NEURO: no focal motor/sensory deficits  LABORATORY DATA:  I have reviewed Heather  data as listed  . CBC Latest Ref Rng & Units 08/18/2020 08/05/2020 08/04/2020  WBC 4.0 - 10.5 K/uL 22.5(H) 3.6(L) 6.3  Hemoglobin 12.0 - 15.0 g/dL 9.5(L) 9.8(L) 10.6(L)  Hematocrit 36 - 46 % 29.8(L) 31.0(L) 33.0(L)  Platelets 150 - 400 K/uL 218 291 293    . CMP Latest Ref Rng & Units 08/05/2020 08/04/2020 08/03/2020  Glucose 70 - 99 mg/dL 166(H) 196(H) 246(H)  BUN 6 -  20 mg/dL 15 14 12   Creatinine 0.44 - 1.00 mg/dL 0.56 0.55 0.58  Sodium 135 - 145 mmol/L 136 138 141  Potassium 3.5 - 5.1 mmol/L 3.5 3.7 4.0  Chloride 98 - 111 mmol/L 98 102 105  CO2 22 - 32 mmol/L 27 26 26   Calcium 8.9 - 10.3 mg/dL 9.3 10.1 9.7  Total Protein 6.5 - 8.1 g/dL 6.5 7.1 6.7  Total Bilirubin 0.3 - 1.2 mg/dL 0.7 0.5 0.2(L)  Alkaline Phos 38 - 126 U/L 60 70 60  AST 15 - 41 U/L 17 19 18   ALT 0 - 44 U/L 20 20 18    04/08/2020 Pancreatic Mass Surgical Pathology Report [id number]):     RADIOGRAPHIC STUDIES: I have personally reviewed Heather radiological images as listed and agreed with Heather findings in Heather report. DG FLUORO GUIDED LOC OF NEEDLE/CATH TIP FOR SPINAL INJECT RT  Result Date: 08/02/2020 Felipa Emory, MD     08/02/2020  2:26 PM Heather L3-L4 interspace was accessed after sterile prep and drape and local anesthesia with a 20 gauge spinal needle without difficulty. After documenting flow of clear CSF methotrexate/ solumedrol was injected through Heather spinal needle without difficulty.    DG FLUORO GUIDE LUMBAR PUNCTURE  Result Date: 08/02/2020 CLINICAL DATA:  Intrathecal methotrexate injection EXAM: DIAGNOSTIC LUMBAR PUNCTURE UNDER FLUOROSCOPIC GUIDANCE FLUOROSCOPY TIME:  Fluoroscopy Time:  30 seconds Radiation Exposure Index (if provided by Heather fluoroscopic device): 2.2 mGy Number of Acquired Spot Images: 0 PROCEDURE: Informed consent was obtained from Heather patient prior to Heather procedure, including potential complications of headache, allergy, and pain. With Heather patient prone, Heather lower back was prepped with  Betadine. 1% Lidocaine was used for local anesthesia. Lumbar puncture was performed at Heather L3-L4 level using a 20 gauge needle with return of 2.5 cc of clear CSF was withdrawn for flushing of tubing during administration of intrathecal 12 mg methotrexate and 50 mg Solu-Cortef. After confirmation of flow of clear CSF Heather intrathecal chemotherapy was injected without difficulty. Heather patient tolerated Heather procedure well and there were no apparent complications. IMPRESSION: 1. Lumbar puncture for intrathecal chemotherapy as detailed above. Electronically Signed   By: Zetta Bills M.D.   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 Hemoglobin C], childhood malaria. Patient notes that she had a CT of Heather abdomen sometime in 2020 that showed no acute pathology.This was not [facility]. Not accessible to [place] at this point. She did have an MRI of Heather abdomen in July 2019 which showed indeterminate splenic lesions.No other acute abdominal pathology.No concern with hepatocellular carcinoma.  Patient is presenting now with  #1Pancreatic mass along with retroperitoneal lymphadenopathy and diffuse splenic lesions. No internal necrosis within Heather mass noted. Diffuse splenomegaly. Left periaortic lymph node causing mild displacement of Heather third portion of Heather duodenum.  Significantly elevated LDH levels. CA 19-9 and CEA levels unrevealing  # 2 Left sided large pleural effusion and lung atelectasis. S/p diagnostic and therapeutic thoracentesis -- lymphocytic predominance @ 80%  Overall picture concerning for possible  High grade B cell lymphoma vs primary pancreatic malignancy (though CA 19-9 levels indeterminate  and not significantly elevated.  HIV could be risk factors for high grade EBV driven lymphomas  Patient symptomatology has developed  rather quickly over Heather last few weeks.  #3 microcytic anemia-chronic.Some element of iron deficiency versus hemoglobinopathy versus anemia of chronic disease related to malignancy. Patient has received 1 dose of IV Feraheme Will rpt 2nd dose in 1 week.  #4 history of HIV/AIDS follows with Dr. Melynda Keller and at [facility].  # Leucocytosis - due to G-CSF PLAN: -Discussed [name] labwork today, 08/18/20; blood counts are holding well. -No lab or clinical evidence of B-cell Lymphoma recurrence at this time. -Recommend steam inhaler and sterile saline sprays 3-4 times per day. -Will give COVID19 booster and Flu vaccine after PET/CT. Recommend [name] wait two weeks between vaccinations if possible. -Discussed referral to [facility] - [name] will consider. -Will get PET/CT in 12 days -Will see back in 2 weeks with labs   FOLLOW UP: PET/CT in 12 days RTC with Dr Irene Limbo in 14 days with portflush and labs and appointment for covid booster vaccine   Heather total time spent in Heather appt was 20 minutes and more than 50% was on counseling and direct patient cares.  All of Heather patient's questions were answered with apparent satisfaction. Heather patient knows to call Heather clinic with any problems, questions or concerns.   Sullivan Lone MD [place] AAHIVMS [facility] [place] Hematology/Oncology Physician [facility]  (Office):       [phone] (Work cell):  [phone] (Fax):           [phone]  08/18/2020 2:28 PM  I, Yevette Edwards, am acting as a scribe for Dr. Sullivan Lone.   .I have reviewed Heather above documentation for accuracy and completeness, and I agree with Heather above. Brunetta Genera MD

## 2020-08-22 ENCOUNTER — Telehealth: Payer: Self-pay | Admitting: Hematology

## 2020-08-22 NOTE — Telephone Encounter (Signed)
Scheduled per 10/14 los, patient has been called and notified.

## 2020-08-23 ENCOUNTER — Encounter: Payer: Self-pay | Admitting: Hematology

## 2020-08-24 ENCOUNTER — Encounter: Payer: Self-pay | Admitting: *Deleted

## 2020-08-30 ENCOUNTER — Other Ambulatory Visit: Payer: Self-pay

## 2020-08-30 ENCOUNTER — Ambulatory Visit (HOSPITAL_COMMUNITY)
Admission: RE | Admit: 2020-08-30 | Discharge: 2020-08-30 | Disposition: A | Discharge status: Home / Self Care | Payer: 59 | Source: Ambulatory Visit | Attending: Hematology | Admitting: Hematology

## 2020-08-30 DIAGNOSIS — C8338 Diffuse large B-cell lymphoma, lymph nodes of multiple sites: Secondary | ICD-10-CM | POA: Insufficient documentation

## 2020-08-30 LAB — GLUCOSE, CAPILLARY: Glucose-Capillary: 101 mg/dL — ABNORMAL HIGH (ref 70–99)

## 2020-08-30 MED ORDER — FLUDEOXYGLUCOSE F - 18 (FDG) INJECTION: 7.1000 | Freq: Once | INTRAVENOUS | Status: DC | PRN

## 2020-08-30 NOTE — Progress Notes (Signed)
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 female with a past medical history significant for hepatitis B, HIV, chronic anemia who presented to [facility] with abdominal pain.  She had a 2-week history of early satiety, poor appetite and, 5 pound weight loss.  Abdominal pain is typically postprandial and mainly at night after dinner while laying in bed.  Has been associated with nausea improves after vomiting.  Labs on admission showed a hemoglobin of 9.3, MCV 67.4, platelet count 554,000.  Lipase was mildly elevated at 123.  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  Husband was at the bedside at the time of the visit.  The patient reports that she is having ongoing abdominal discomfort today.  Pain is primarily in her epigastric area and radiates to the left side of her abdomen.  States pain is worse when laying down after she eats.  She states that she has had this pain for at least 2 weeks.  Her abdominal pain is worse after eating.  She has had some nausea and intermittent vomiting.  Vomiting makes her pain better.  She was attributing her symptoms to some the medications that she was taking for fertility treatments.  She reports a poor appetite and about a 5 pound weight loss.  She is not having  any fevers or chills.  Denies night sweats.  She has not noticed any palpable lymphadenopathy.  She denies headaches, dizziness, chest pain, shortness of breath, diarrhea.  She reports intermittent constipation.  Denies bleeding or lower extremity edema.  The patient is married and has no children.  She currently works as an Therapist, sports.  Denies alcohol tobacco use.  Family history significant for paternal grandmother with uterine cancer.  Medical oncology was asked see the patient to make recommendations regarding her abnormal CT scan findings.  INTERVAL HISTORY:  Heather [name] is a 44 year old female who is here for evaluation and management of Large B-cell lymphoma. The patient's last visit with [place] was on 08/18/2020. The pt reports that she is doing well overall.  The pt reports no acute new symptoms. No fevers/chills/nigthsweats. Some weight gian. Blood sugars WNL  Of note since the patient's last visit, pt has had PET/CT([phone]) completed on 08/30/2020 with results revealing 1. Continued reduction in size and metabolic activity of splenic lesions. Residual areas of low attenuation within the spleen exhibit mild FDG uptake compatible with Deauville criteria 3. 2. No new sites of disease.   Lab results today (08/30/20) of CBC w/diff and CMP reviewed. 09/02/2020 LDH at 269   MEDICAL HISTORY:  Past Medical History:  Diagnosis Date  . Anemia   . Diffuse large B-cell lymphoma of lymph nodes of multiple regions ([place]) 04/12/2020  . 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 History of blood transfusion    childhood  . HIV (human immunodeficiency virus infection) ([place])   . Immune  deficiency disorder [facility])     SURGICAL HISTORY: Past Surgical History:  Procedure Laterality Date  . CHROMOPERTUBATION Bilateral 11/29/2016   Procedure: CHROMOPERTUBATION;  Surgeon: Waymon Amato, MD;  Location: [facility];  Service: Gynecology;  Laterality: Bilateral;  fallopian tubes  . ENDOMETRIAL BIOPSY    . IR IMAGING  GUIDED PORT INSERTION  04/15/2020  . IR THORACENTESIS ASP PLEURAL SPACE W/IMG GUIDE  04/15/2020  . MYOMECTOMY N/A 11/29/2016   Procedure: MYOMECTOMY;  Surgeon: Waymon Amato, MD;  Location: [facility];  Service: Gynecology;  Laterality: N/A;    SOCIAL HISTORY: Social History   Socioeconomic History  . Marital status: Married    Spouse name: Not on file  . Number of children: Not on file  . Years of education: Not on file  . Highest education level: Not on file  Occupational History  . Not on file  Tobacco Use  . Smoking status: Never Smoker  . Smokeless tobacco: Never Used  Substance and Sexual Activity  . Alcohol use: Yes    Comment: occ  . Drug use: No  . Sexual activity: Yes  Other Topics Concern  . Not on file  Social History Narrative  . Not on file   Social Determinants of Health   Financial Resource Strain:   . Difficulty of Paying Living Expenses: Not on file  Food Insecurity:   . Worried About Charity fundraiser in the Last Year: Not on file  . Ran Out of Food in the Last Year: Not on file  Transportation Needs:   . Lack of Transportation (Medical): Not on file  . Lack of Transportation (Non-Medical): Not on file  Physical Activity:   . Days of Exercise per Week: Not on file  . Minutes of Exercise per Session: Not on file  Stress:   . Feeling of Stress : Not on file  Social Connections:   . Frequency of Communication with Friends and Family: Not on file  . Frequency of Social Gatherings with Friends and Family: Not on file  . Attends Religious Services: Not on file  . Active Member of Clubs or Organizations: Not on file  . Attends Archivist Meetings: Not on file  . Marital Status: Not on file  Intimate Partner Violence:   . Fear of Current or Ex-Partner: Not on file  . Emotionally Abused: Not on file  . Physically Abused: Not on file  . Sexually Abused: Not on file    FAMILY HISTORY: Family History  Problem Relation Age of Onset  . Diabetes  Mother   . Hypertension Mother     ALLERGIES:  has No Known Allergies.  MEDICATIONS:  Current Outpatient Medications  Medication Sig Dispense Refill  . acetaminophen (TYLENOL) 500 MG tablet Take 500 mg by mouth every 6 (six) hours as needed for mild pain or headache.    Marland Kitchen acyclovir (ZOVIRAX) 400 MG tablet Take 1 tablet (400 mg total) by mouth 2 (two) times daily. 60 tablet 6  . B Complex Vitamins (B COMPLEX PO) Take 1 tablet by mouth daily.    . bictegravir-emtricitabine-tenofovir AF (BIKTARVY) 50-200-25 MG TABS tablet Take 1 tablet by mouth daily with breakfast.     . cholecalciferol (VITAMIN D3) 25 MCG (1000 UNIT) tablet Take 1 tablet (1,000 Units total) by mouth at bedtime.    . feeding supplement, ENSURE ENLIVE, (ENSURE ENLIVE) LIQD Take 237 mLs by mouth 2 (two) times daily between meals. 237 mL 12  . HYDROcodone-acetaminophen (NORCO) 5-325 MG tablet  Take 1 tablet by mouth every 6 (six) hours as needed for moderate pain. 30 tablet 0  . ibuprofen (ADVIL) 200 MG tablet Take 2 tablets (400 mg total) by mouth every 6 (six) hours as needed for mild pain. (Patient not taking: Reported on 06/20/2020) 30 tablet 0  . lidocaine-prilocaine (EMLA) cream Apply to affected area once (Patient taking differently: Apply 1 application topically as needed (port access). Apply to affected area once) 30 g 3  . LORazepam (ATIVAN) 0.5 MG tablet Take 1 tablet (0.5 mg total) by mouth every 6 (six) hours as needed (for chemo induced nausea or vomiting). 30 tablet 0  . Multiple Vitamin (MULTIVITAMIN WITH MINERALS) TABS tablet Take 1 tablet by mouth at bedtime.     . ondansetron (ZOFRAN) 8 MG tablet Take 1 tablet (8 mg total) by mouth every 8 (eight) hours as needed for nausea or vomiting. 30 tablet 1  . polyethylene glycol (MIRALAX / GLYCOLAX) 17 g packet Take 17 g by mouth daily as needed for mild constipation. 14 each 0  . prochlorperazine (COMPAZINE) 10 MG tablet Take 1 tablet (10 mg total) by mouth every 6 (six)  hours as needed for nausea or vomiting. 30 tablet 1  . senna-docusate (SENOKOT-S) 8.6-50 MG tablet Take 2 tablets by mouth at bedtime as needed for mild constipation.    . Sodium Chloride-Sodium Bicarb (SODIUM BICARBONATE/SODIUM CHLORIDE) SOLN 1 application by Mouth Rinse route 4 (four) times daily.     No current facility-administered medications for this visit.   Facility-Administered Medications Ordered in Other Visits  Medication Dose Route Frequency Provider Last Rate Last Admin  . fludeoxyglucose F - 18 (FDG) injection 7.1 millicurie  7.1 millicurie Intravenous Once PRN Gus Height, MD        REVIEW OF SYSTEMS:   A 10+ POINT REVIEW OF SYSTEMS WAS OBTAINED including neurology, dermatology, psychiatry, cardiac, respiratory, lymph, extremities, GI, GU, Musculoskeletal, constitutional, breasts, reproductive, HEENT.  All pertinent positives are noted in the HPI.  All others are negative.   PHYSICAL EXAMINATION: ECOG PERFORMANCE STATUS: 1 - Symptomatic but completely ambulatory  . Vitals:   09/02/20 1105  BP: 124/77  Pulse: 92  Resp: 18  Temp: 97.6 F (36.4 C)  SpO2: 100%   Filed Weights   09/02/20 1105  Weight: 146 lb 9.6 oz (66.5 kg)   .Body mass index is 28.63 kg/m.   GENERAL:alert, in no acute distress and comfortable SKIN: no acute rashes, no significant lesions EYES: conjunctiva are pink and non-injected, sclera anicteric OROPHARYNX: MMM, no exudates, no oropharyngeal erythema or ulceration NECK: supple, no JVD LYMPH:  no palpable lymphadenopathy in the cervical, axillary or inguinal regions LUNGS: clear to auscultation b/l with normal respiratory effort HEART: regular rate & rhythm ABDOMEN:  normoactive bowel sounds , non tender, not distended. No palpable hepatosplenomegaly.  Extremity: no pedal edema PSYCH: alert & oriented x 3 with fluent speech NEURO: no focal motor/sensory deficits  LABORATORY DATA:  I have reviewed the data as listed  . CBC Latest Ref  Rng & Units 09/02/2020 08/18/2020 08/05/2020  WBC 4.0 - 10.5 K/uL 3.3(L) 22.5(H) 3.6(L)  Hemoglobin 12.0 - 15.0 g/dL 11.0(L) 9.5(L) 9.8(L)  Hematocrit 36 - 46 % 35.1(L) 29.8(L) 31.0(L)  Platelets 150 - 400 K/uL 227 218 291    . CMP Latest Ref Rng & Units 09/02/2020 08/18/2020 08/05/2020  Glucose 70 - 99 mg/dL 97 196(H) 166(H)  BUN 6 - 20 mg/dL 13 9 15   Creatinine 0.44 - 1.00 mg/dL 0.79  0.74 0.56  Sodium 135 - 145 mmol/L 143 141 136  Potassium 3.5 - 5.1 mmol/L 4.5 3.8 3.5  Chloride 98 - 111 mmol/L 106 105 98  CO2 22 - 32 mmol/L 30 28 27   Calcium 8.9 - 10.3 mg/dL 10.2 9.9 9.3  Total Protein 6.5 - 8.1 g/dL 6.7 6.6 6.5  Total Bilirubin 0.3 - 1.2 mg/dL 0.4 0.3 0.7  Alkaline Phos 38 - 126 U/L 93 121 60  AST 15 - 41 U/L 20 20 17   ALT 0 - 44 U/L 18 17 20    . Lab Results  Component Value Date   LDH 269 (H) 09/02/2020     04/08/2020 Pancreatic Mass Surgical Pathology Report [phone]):     RADIOGRAPHIC STUDIES: I have personally reviewed the radiological images as listed and agreed with the findings in the report. DG FLUORO GUIDED LOC OF NEEDLE/CATH TIP FOR SPINAL INJECT RT  Result Date: 08/02/2020 Felipa Emory, MD     08/02/2020  2:26 PM The L3-L4 interspace was accessed after sterile prep and drape and local anesthesia with a 20 gauge spinal needle without difficulty. After documenting flow of clear CSF methotrexate/ solumedrol was injected through the spinal needle without difficulty.    DG FLUORO GUIDE LUMBAR PUNCTURE  Result Date: 08/02/2020 CLINICAL DATA:  Intrathecal methotrexate injection EXAM: DIAGNOSTIC LUMBAR PUNCTURE UNDER FLUOROSCOPIC GUIDANCE FLUOROSCOPY TIME:  Fluoroscopy Time:  30 seconds Radiation Exposure Index (if provided by the fluoroscopic device): 2.2 mGy Number of Acquired Spot Images: 0 PROCEDURE: Informed consent was obtained from the patient prior to the procedure, including potential complications of headache, allergy, and pain. With the patient  prone, the lower back was prepped with Betadine. 1% Lidocaine was used for local anesthesia. Lumbar puncture was performed at the L3-L4 level using a 20 gauge needle with return of 2.5 cc of clear CSF was withdrawn for flushing of tubing during administration of intrathecal 12 mg methotrexate and 50 mg Solu-Cortef. After confirmation of flow of clear CSF the intrathecal chemotherapy was injected without difficulty. The patient tolerated the procedure well and there were no apparent complications. IMPRESSION: 1. Lumbar puncture for intrathecal chemotherapy as detailed above. Electronically Signed   By: Zetta Bills M.D.   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 Hemoglobin C], childhood malaria. Patient notes that she had a CT of the abdomen sometime in 2020 that showed no acute pathology.This was not [facility]. Not accessible to [place] at this point. She did have an MRI of the abdomen in July 2019 which showed indeterminate splenic lesions.No other acute abdominal pathology.No concern with hepatocellular carcinoma.  Patient is presenting now with  #1Pancreatic mass along with retroperitoneal lymphadenopathy and diffuse splenic lesions. No internal necrosis within the mass noted. Diffuse splenomegaly. Left periaortic lymph node causing mild displacement of the third portion of the duodenum.  Significantly elevated LDH levels. CA 19-9 and CEA levels unrevealing  # 2 Left sided large pleural effusion and lung atelectasis. S/p diagnostic and therapeutic thoracentesis -- lymphocytic predominance @ 80%  Overall picture concerning for possible  High grade B cell lymphoma vs primary pancreatic  malignancy (though CA 19-9 levels indeterminate and not significantly elevated.  HIV could be risk factors for high grade EBV driven lymphomas  Patient symptomatology has developed rather quickly over the last few weeks.  #3 microcytic anemia-chronic.Some element of iron deficiency versus hemoglobinopathy versus anemia of chronic disease related to malignancy. Patient has received 1 dose of IV Feraheme Will rpt 2nd dose in 1 week.  #4 history of HIV/AIDS follows with Dr. Melynda Keller and at [facility].  # Leucocytosis - due to G-CSF PLAN: Cbc improving with resolving anemia and thrombocytopenia -LDH- improved..still a little elevated likely from G-CSF -PET/CT reviewed with patient -- 1. Continued reduction in size and metabolic activity of splenic lesions. Residual areas of low attenuation within the spleen exhibit mild FDG uptake compatible with Deauville criteria 3. 2. No new sites of disease. -no evidence of active disease needing additional treatment at this time. -discussion option for ISRT for consolidation and since this would involve radiating the spleen patient would like to hold off on this option. -   FOLLOW UP: Covid booster vaccine today PET/CT in 11 weeks RTC with Dr Irene Limbo in 12 weeks   The total time spent in the appt was 20 minutes and more than 50% was on counseling and direct patient cares.  All of the patient's questions were answered with apparent satisfaction. The patient knows to call the clinic with any problems, questions or concerns.   Sullivan Lone MD [place] AAHIVMS [facility] Hematology/Oncology Physician [facility]  (Office):       [phone] (Work cell):  [phone] (Fax):           [phone]  08/30/2020 1:46 PM  I, Yevette Edwards, am acting as a scribe for Dr. Sullivan Lone.   .I have reviewed the above documentation for accuracy and completeness, and I agree with the above. Brunetta Genera  MD

## 2020-09-02 ENCOUNTER — Inpatient Hospital Stay: Payer: 59

## 2020-09-02 ENCOUNTER — Other Ambulatory Visit: Payer: Self-pay

## 2020-09-02 ENCOUNTER — Inpatient Hospital Stay: Payer: 59 | Admitting: Hematology

## 2020-09-02 VITALS — BP 124/77 | HR 92 | Temp 97.6°F | Resp 18 | Ht 60.0 in | Wt 146.6 lb

## 2020-09-02 DIAGNOSIS — Z5112 Encounter for antineoplastic immunotherapy: Secondary | ICD-10-CM | POA: Diagnosis not present

## 2020-09-02 DIAGNOSIS — Z95828 Presence of other vascular implants and grafts: Secondary | ICD-10-CM

## 2020-09-02 DIAGNOSIS — C8338 Diffuse large B-cell lymphoma, lymph nodes of multiple sites: Secondary | ICD-10-CM

## 2020-09-02 LAB — CBC WITH DIFFERENTIAL/PLATELET
Abs Immature Granulocytes: 0.05 10*3/uL (ref 0.00–0.07)
Basophils Absolute: 0.1 10*3/uL (ref 0.0–0.1)
Basophils Relative: 2 %
Eosinophils Absolute: 0.1 10*3/uL (ref 0.0–0.5)
Eosinophils Relative: 3 %
HCT: 35.1 % — ABNORMAL LOW (ref 36.0–46.0)
Hemoglobin: 11 g/dL — ABNORMAL LOW (ref 12.0–15.0)
Immature Granulocytes: 2 %
Lymphocytes Relative: 29 %
Lymphs Abs: 0.9 10*3/uL (ref 0.7–4.0)
MCH: 27.4 pg (ref 26.0–34.0)
MCHC: 31.3 g/dL (ref 30.0–36.0)
MCV: 87.5 fL (ref 80.0–100.0)
Monocytes Absolute: 0.5 10*3/uL (ref 0.1–1.0)
Monocytes Relative: 16 %
Neutro Abs: 1.6 10*3/uL — ABNORMAL LOW (ref 1.7–7.7)
Neutrophils Relative %: 48 %
Platelets: 227 10*3/uL (ref 150–400)
RBC: 4.01 MIL/uL (ref 3.87–5.11)
RDW: 19.8 % — ABNORMAL HIGH (ref 11.5–15.5)
WBC: 3.3 10*3/uL — ABNORMAL LOW (ref 4.0–10.5)
nRBC: 0 % (ref 0.0–0.2)

## 2020-09-02 LAB — CMP (CANCER CENTER ONLY)
ALT: 18 U/L (ref 0–44)
AST: 20 U/L (ref 15–41)
Albumin: 4 g/dL (ref 3.5–5.0)
Alkaline Phosphatase: 93 U/L (ref 38–126)
Anion gap: 7 (ref 5–15)
BUN: 13 mg/dL (ref 6–20)
CO2: 30 mmol/L (ref 22–32)
Calcium: 10.2 mg/dL (ref 8.9–10.3)
Chloride: 106 mmol/L (ref 98–111)
Creatinine: 0.79 mg/dL (ref 0.44–1.00)
GFR, Estimated: >60 mL/min (ref >60)
Glucose, Bld: 97 mg/dL (ref 70–99)
Potassium: 4.5 mmol/L (ref 3.5–5.1)
Sodium: 143 mmol/L (ref 135–145)
Total Bilirubin: 0.4 mg/dL (ref 0.3–1.2)
Total Protein: 6.7 g/dL (ref 6.5–8.1)

## 2020-09-02 LAB — LACTATE DEHYDROGENASE: LDH: 269 U/L — ABNORMAL HIGH (ref 98–192)

## 2020-09-02 MED ORDER — SODIUM CHLORIDE 0.9% FLUSH
10.0000 mL | Freq: Once | INTRAVENOUS | Status: AC
  Administered 2020-09-02: 10 mL
  Filled 2020-09-02: qty 10

## 2020-09-02 MED ORDER — HEPARIN SOD (PORK) LOCK FLUSH 100 UNIT/ML IV SOLN
250.0000 [IU] | Freq: Once | INTRAVENOUS | Status: DC
  Filled 2020-09-02: qty 5

## 2020-09-02 MED ORDER — HEPARIN SOD (PORK) LOCK FLUSH 100 UNIT/ML IV SOLN
500.0000 [IU] | Freq: Once | INTRAVENOUS | Status: AC
  Administered 2020-09-02: 500 [IU]
  Filled 2020-09-02: qty 5

## 2020-09-06 ENCOUNTER — Inpatient Hospital Stay: Payer: 59 | Admitting: Hematology

## 2020-09-06 ENCOUNTER — Inpatient Hospital Stay: Payer: 59

## 2020-10-17 ENCOUNTER — Encounter: Payer: Self-pay | Admitting: Hematology

## 2020-10-19 ENCOUNTER — Encounter: Payer: Self-pay | Admitting: *Deleted

## 2020-11-15 ENCOUNTER — Ambulatory Visit (HOSPITAL_COMMUNITY)
Admission: RE | Admit: 2020-11-15 | Discharge: 2020-11-15 | Disposition: A | Discharge status: Home / Self Care | Payer: 59 | Source: Ambulatory Visit | Attending: Hematology | Admitting: Hematology

## 2020-11-15 ENCOUNTER — Other Ambulatory Visit: Payer: Self-pay

## 2020-11-15 DIAGNOSIS — C8338 Diffuse large B-cell lymphoma, lymph nodes of multiple sites: Secondary | ICD-10-CM | POA: Diagnosis present

## 2020-11-15 LAB — GLUCOSE, CAPILLARY: Glucose-Capillary: 102 mg/dL — ABNORMAL HIGH (ref 70–99)

## 2020-11-15 MED ORDER — FLUDEOXYGLUCOSE F - 18 (FDG) INJECTION
7.5000 | Freq: Once | INTRAVENOUS | Status: AC
  Administered 2020-11-15: 7.5 via INTRAVENOUS

## 2020-11-16 ENCOUNTER — Inpatient Hospital Stay: Payer: 59 | Attending: Hematology | Admitting: Hematology

## 2020-11-16 ENCOUNTER — Inpatient Hospital Stay: Payer: 59

## 2020-11-16 ENCOUNTER — Encounter: Payer: Self-pay | Admitting: Hematology

## 2020-11-16 ENCOUNTER — Other Ambulatory Visit: Payer: Self-pay

## 2020-11-16 ENCOUNTER — Other Ambulatory Visit: Payer: Self-pay | Admitting: *Deleted

## 2020-11-16 ENCOUNTER — Encounter: Payer: Self-pay | Admitting: *Deleted

## 2020-11-16 VITALS — BP 131/89 | HR 89 | Temp 97.8°F | Resp 15 | Ht 60.0 in | Wt 153.4 lb

## 2020-11-16 DIAGNOSIS — D72829 Elevated white blood cell count, unspecified: Secondary | ICD-10-CM | POA: Insufficient documentation

## 2020-11-16 DIAGNOSIS — B20 Human immunodeficiency virus [HIV] disease: Secondary | ICD-10-CM | POA: Diagnosis not present

## 2020-11-16 DIAGNOSIS — D509 Iron deficiency anemia, unspecified: Secondary | ICD-10-CM | POA: Insufficient documentation

## 2020-11-16 DIAGNOSIS — C8338 Diffuse large B-cell lymphoma, lymph nodes of multiple sites: Secondary | ICD-10-CM | POA: Diagnosis present

## 2020-11-16 DIAGNOSIS — Z95828 Presence of other vascular implants and grafts: Secondary | ICD-10-CM

## 2020-11-16 LAB — CMP (CANCER CENTER ONLY)
ALT: 15 U/L (ref 0–44)
AST: 19 U/L (ref 15–41)
Albumin: 4.1 g/dL (ref 3.5–5.0)
Alkaline Phosphatase: 99 U/L (ref 38–126)
Anion gap: 8 (ref 5–15)
BUN: 15 mg/dL (ref 6–20)
CO2: 28 mmol/L (ref 22–32)
Calcium: 10.2 mg/dL (ref 8.9–10.3)
Chloride: 105 mmol/L (ref 98–111)
Creatinine: 0.74 mg/dL (ref 0.44–1.00)
GFR, Estimated: >60 mL/min (ref >60)
Glucose, Bld: 103 mg/dL — ABNORMAL HIGH (ref 70–99)
Potassium: 3.9 mmol/L (ref 3.5–5.1)
Sodium: 141 mmol/L (ref 135–145)
Total Bilirubin: 0.3 mg/dL (ref 0.3–1.2)
Total Protein: 7.2 g/dL (ref 6.5–8.1)

## 2020-11-16 LAB — LACTATE DEHYDROGENASE: LDH: 206 U/L — ABNORMAL HIGH (ref 98–192)

## 2020-11-16 LAB — CBC WITH DIFFERENTIAL (CANCER CENTER ONLY)
Abs Immature Granulocytes: 0.05 10*3/uL (ref 0.00–0.07)
Basophils Absolute: 0 10*3/uL (ref 0.0–0.1)
Basophils Relative: 1 %
Eosinophils Absolute: 0.2 10*3/uL (ref 0.0–0.5)
Eosinophils Relative: 5 %
HCT: 38 % (ref 36.0–46.0)
Hemoglobin: 12.3 g/dL (ref 12.0–15.0)
Immature Granulocytes: 2 %
Lymphocytes Relative: 32 %
Lymphs Abs: 1 10*3/uL (ref 0.7–4.0)
MCH: 26.1 pg (ref 26.0–34.0)
MCHC: 32.4 g/dL (ref 30.0–36.0)
MCV: 80.7 fL (ref 80.0–100.0)
Monocytes Absolute: 0.4 10*3/uL (ref 0.1–1.0)
Monocytes Relative: 11 %
Neutro Abs: 1.6 10*3/uL — ABNORMAL LOW (ref 1.7–7.7)
Neutrophils Relative %: 49 %
Platelet Count: 205 10*3/uL (ref 150–400)
RBC: 4.71 MIL/uL (ref 3.87–5.11)
RDW: 16.2 % — ABNORMAL HIGH (ref 11.5–15.5)
WBC Count: 3.2 10*3/uL — ABNORMAL LOW (ref 4.0–10.5)
nRBC: 0 % (ref 0.0–0.2)

## 2020-11-16 MED ORDER — HEPARIN SOD (PORK) LOCK FLUSH 100 UNIT/ML IV SOLN
500.0000 [IU] | Freq: Once | INTRAVENOUS | Status: AC
  Administered 2020-11-16: 500 [IU]
  Filled 2020-11-16: qty 5

## 2020-11-16 MED ORDER — SODIUM CHLORIDE 0.9% FLUSH
10.0000 mL | Freq: Once | INTRAVENOUS | Status: AC
  Administered 2020-11-16: 10 mL
  Filled 2020-11-16: qty 10

## 2020-11-16 NOTE — Patient Instructions (Signed)

## 2020-11-22 NOTE — Progress Notes (Signed)
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 [name] with a past medical history significant for hepatitis B, HIV, chronic anemia who presented to [place] with abdominal pain.  She had a 2-week history of early satiety, poor appetite and, 5 pound weight loss.  Abdominal pain is typically postprandial and mainly at night after dinner while laying in bed.  Has been associated with nausea improves after vomiting.  Labs on admission showed a hemoglobin of 9.3, MCV 67.4, platelet count 554,000.  Lipase was mildly elevated at 123.  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  Husband was at the bedside at the time of the visit.  The patient reports that she is having ongoing abdominal discomfort today.  Pain is primarily in her epigastric area and radiates to the left side of her abdomen.  States pain is worse when laying down after she eats.  She states that she has had this pain for at least 2 weeks.  Her abdominal pain is worse after eating.  She has had some nausea and intermittent vomiting.  Vomiting makes her pain better.  She was attributing her symptoms to some the medications that she was taking for fertility treatments.  She reports a poor appetite and about a 5 pound weight loss.  She is not  having any fevers or chills.  Denies night sweats.  She has not noticed any palpable lymphadenopathy.  She denies headaches, dizziness, chest pain, shortness of breath, diarrhea.  She reports intermittent constipation.  Denies bleeding or lower extremity edema.  The patient is married and has no children.  She currently works as an Therapist, sports.  Denies alcohol tobacco use.  Family history significant for paternal grandmother with uterine cancer.  Medical oncology was asked see the patient to make recommendations regarding her abnormal CT scan findings.  INTERVAL HISTORY:  Heather [name] is a 45 year old [name] who is here for evaluation and management of Large B-cell lymphoma. She has completed her planned 6 cycles of EPOCH-R and IT MTX. Notes no acute new concerns. No fevers/chills/night sweats/unexpected weight loss. Notes that she recently was seen by Dr Jackalyn Lombard and everything is stable from HIV and Hep B standpoint.  PET/CT and labs done recently reviewed- no overt evidence of active lymphoma.   MEDICAL HISTORY:  Past Medical History:  Diagnosis Date  . Anemia   . Diffuse large B-cell lymphoma of lymph nodes of multiple regions ([place]) 04/12/2020  . 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 History of blood transfusion    childhood  . HIV (human immunodeficiency virus infection) ([place])   . Immune deficiency disorder [facility])     SURGICAL HISTORY: Past Surgical History:  Procedure Laterality Date  . CHROMOPERTUBATION Bilateral 11/29/2016   Procedure: CHROMOPERTUBATION;  Surgeon: Waymon Amato, MD;  Location: [facility];  Service: Gynecology;  Laterality: Bilateral;  fallopian tubes  . ENDOMETRIAL BIOPSY    .  IR IMAGING GUIDED PORT INSERTION  04/15/2020  . IR THORACENTESIS ASP PLEURAL SPACE W/IMG GUIDE  04/15/2020  . MYOMECTOMY N/A 11/29/2016   Procedure: MYOMECTOMY;  Surgeon: Waymon Amato, MD;  Location: [facility];  Service: Gynecology;  Laterality: N/A;    SOCIAL HISTORY: Social History   Socioeconomic  History  . Marital status: Married    Spouse name: Not on file  . Number of children: Not on file  . Years of education: Not on file  . Highest education level: Not on file  Occupational History  . Not on file  Tobacco Use  . Smoking status: Never Smoker  . Smokeless tobacco: Never Used  Substance and Sexual Activity  . Alcohol use: Yes    Comment: occ  . Drug use: No  . Sexual activity: Yes  Other Topics Concern  . Not on file  Social History Narrative  . Not on file   Social Determinants of Health   Financial Resource Strain: Not on file  Food Insecurity: Not on file  Transportation Needs: Not on file  Physical Activity: Not on file  Stress: Not on file  Social Connections: Not on file  Intimate Partner Violence: Not on file    FAMILY HISTORY: Family History  Problem Relation Age of Onset  . Diabetes Mother   . Hypertension Mother     ALLERGIES:  has No Known Allergies.  MEDICATIONS:  Current Outpatient Medications  Medication Sig Dispense Refill  . acetaminophen (TYLENOL) 500 MG tablet Take 500 mg by mouth every 6 (six) hours as needed for mild pain or headache.    Marland Kitchen acyclovir (ZOVIRAX) 400 MG tablet Take 1 tablet (400 mg total) by mouth 2 (two) times daily. 60 tablet 6  . B Complex Vitamins (B COMPLEX PO) Take 1 tablet by mouth daily.    . bictegravir-emtricitabine-tenofovir AF (BIKTARVY) 50-200-25 MG TABS tablet Take 1 tablet by mouth daily with breakfast.     . cholecalciferol (VITAMIN D3) 25 MCG (1000 UNIT) tablet Take 1 tablet (1,000 Units total) by mouth at bedtime.    . feeding supplement, ENSURE ENLIVE, (ENSURE ENLIVE) LIQD Take 237 mLs by mouth 2 (two) times daily between meals. 237 mL 12  . HYDROcodone-acetaminophen (NORCO) 5-325 MG tablet Take 1 tablet by mouth every 6 (six) hours as needed for moderate pain. 30 tablet 0  . ibuprofen (ADVIL) 200 MG tablet Take 2 tablets (400 mg total) by mouth every 6 (six) hours as needed for mild pain. (Patient not  taking: Reported on 06/20/2020) 30 tablet 0  . lidocaine-prilocaine (EMLA) cream Apply to affected area once (Patient taking differently: Apply 1 application topically as needed (port access). Apply to affected area once) 30 g 3  . LORazepam (ATIVAN) 0.5 MG tablet Take 1 tablet (0.5 mg total) by mouth every 6 (six) hours as needed (for chemo induced nausea or vomiting). 30 tablet 0  . Multiple Vitamin (MULTIVITAMIN WITH MINERALS) TABS tablet Take 1 tablet by mouth at bedtime.     . ondansetron (ZOFRAN) 8 MG tablet Take 1 tablet (8 mg total) by mouth every 8 (eight) hours as needed for nausea or vomiting. 30 tablet 1  . polyethylene glycol (MIRALAX / GLYCOLAX) 17 g packet Take 17 g by mouth daily as needed for mild constipation. 14 each 0  . prochlorperazine (COMPAZINE) 10 MG tablet Take 1 tablet (10 mg total) by mouth every 6 (six) hours as needed for nausea or vomiting. 30 tablet 1  . senna-docusate (SENOKOT-S) 8.6-50 MG  tablet Take 2 tablets by mouth at bedtime as needed for mild constipation.    . Sodium Chloride-Sodium Bicarb (SODIUM BICARBONATE/SODIUM CHLORIDE) SOLN 1 application by Mouth Rinse route 4 (four) times daily.     No current facility-administered medications for this visit.    REVIEW OF SYSTEMS:   A 10+ POINT REVIEW OF SYSTEMS WAS OBTAINED including neurology, dermatology, psychiatry, cardiac, respiratory, lymph, extremities, GI, GU, Musculoskeletal, constitutional, breasts, reproductive, HEENT.  All pertinent positives are noted in the HPI.  All others are negative.   PHYSICAL EXAMINATION: ECOG PERFORMANCE STATUS: 1 - Symptomatic but completely ambulatory  . Vitals:   11/16/20 1536  BP: 131/89  Pulse: 89  Resp: 15  Temp: 97.8 F (36.6 C)  SpO2: 100%   Filed Weights   11/16/20 1536  Weight: 153 lb 6.4 oz (69.6 kg)   .Body mass index is 29.96 kg/m.   GENERAL:alert, in no acute distress and comfortable SKIN: no acute rashes, no significant lesions EYES: conjunctiva  are pink and non-injected, sclera anicteric OROPHARYNX: MMM, no exudates, no oropharyngeal erythema or ulceration NECK: supple, no JVD LYMPH:  no palpable lymphadenopathy in the cervical, axillary or inguinal regions LUNGS: clear to auscultation b/l with normal respiratory effort HEART: regular rate & rhythm ABDOMEN:  normoactive bowel sounds , non tender, not distended. No palpable hepatosplenomegaly.  Extremity: no pedal edema PSYCH: alert & oriented x 3 with fluent speech NEURO: no focal motor/sensory deficits  LABORATORY DATA:  I have reviewed the data as listed  . CBC Latest Ref Rng & Units 11/16/2020 09/02/2020 08/18/2020  WBC 4.0 - 10.5 K/uL 3.2(L) 3.3(L) 22.5(H)  Hemoglobin 12.0 - 15.0 g/dL 12.3 11.0(L) 9.5(L)  Hematocrit 36.0 - 46.0 % 38.0 35.1(L) 29.8(L)  Platelets 150 - 400 K/uL 205 227 218    . CMP Latest Ref Rng & Units 11/16/2020 09/02/2020 08/18/2020  Glucose 70 - 99 mg/dL 103(H) 97 196(H)  BUN 6 - 20 mg/dL 15 13 9   Creatinine 0.44 - 1.00 mg/dL 0.74 0.79 0.74  Sodium 135 - 145 mmol/L 141 143 141  Potassium 3.5 - 5.1 mmol/L 3.9 4.5 3.8  Chloride 98 - 111 mmol/L 105 106 105  CO2 22 - 32 mmol/L 28 30 28   Calcium 8.9 - 10.3 mg/dL 10.2 10.2 9.9  Total Protein 6.5 - 8.1 g/dL 7.2 6.7 6.6  Total Bilirubin 0.3 - 1.2 mg/dL 0.3 0.4 0.3  Alkaline Phos 38 - 126 U/L 99 93 121  AST 15 - 41 U/L 19 20 20   ALT 0 - 44 U/L 15 18 17    . Lab Results  Component Value Date   LDH 206 (H) 11/16/2020     04/08/2020 Pancreatic Mass Surgical Pathology Report [id number]):     RADIOGRAPHIC STUDIES: I have personally reviewed the radiological images as listed and agreed with the findings in the report. NM PET Image Restag (PS) Skull Base To Thigh  Result Date: 11/15/2020 CLINICAL DATA:  Subsequent treatment strategy for large B-cell lymphoma. EXAM: NUCLEAR MEDICINE PET SKULL BASE TO THIGH TECHNIQUE: 7.5 mCi F-18 FDG was injected intravenously. Full-ring PET imaging was  performed from the skull base to thigh after the radiotracer. CT data was obtained and used for attenuation correction and anatomic localization. Fasting blood glucose: 102 mg/dl COMPARISON:  Prior PET CTs from 08/30/2020 and 06/10/2020 FINDINGS: Mediastinal blood pool activity: SUV max 2.23 Liver activity: SUV max 3.45 NECK: Mild symmetric hypermetabolism in the lymphoid tissue in the tonsillar regions. This appears stable. No enlarged or hypermetabolic  neck nodes. Incidental CT findings: none CHEST: Hypermetabolic thymic tissue is noted in the anterior mediastinum. This is a new finding when compared to prior PET CTs. I do not see any residual thymic tissue on prior PET-CT from 08/30/2020. This is most likely due to thymic rebound related to chemotherapy treatment. SUV max is 4.16. No breast masses, supraclavicular or axillary adenopathy. No enlarged or hypermetabolic mediastinal or hilar lymph nodes. No worrisome pulmonary lesions. Incidental CT findings: none ABDOMEN/PELVIS: Stable somewhat lobulated appearance of the spleen. Vague low-attenuation lesions are again demonstrated but not as well seen as the prior study. The 13.5 mm lesion in the lower aspect of the spleen on image 102/4 is stable. The largest lesion in the upper portion of the spleen is difficult to measure on today's exam but is approximately 2.9 x 2.6 cm. It previously measured 2.9 x 2.9 cm. I do not see any residual areas of hypermetabolism. No hepatic lesions. No enlarged or hypermetabolic abdominal or pelvic lymph nodes. No inguinal adenopathy. Incidental CT findings: Stable scattered uterine fibroids. SKELETON: No findings suspicious for osseous lymphoma. Persistent areas of brown fat activity are noted in the supraclavicular fossa is bilaterally. There is also some muscular uptake in the neck and shoulders. Stable mildly hypermetabolic midline skin lesions in the lower abdomen. Incidental CT findings: none IMPRESSION: 1. Stable splenic size  and vague low-attenuation splenic lesions but no areas of hypermetabolism are demonstrated. This would suggest a good response to treatment. (Deauville 1) 2. No hypermetabolic lymphadenopathy in the neck, chest, abdomen or pelvis. 3. Hypermetabolic thymic tissue most consistent with thymic rebound. 4. Stable midline lower abdominal skin lesions. Electronically Signed   By: Marijo Sanes M.D.   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 Hemoglobin C], childhood malaria. Patient notes that she had a CT of the abdomen sometime in 2020 that showed no acute pathology.This was not [facility]. Not accessible to [place] at this point. She did have an MRI of the abdomen in July 2019 which showed indeterminate splenic lesions.No other acute abdominal pathology.No concern with hepatocellular carcinoma.  Patient is presenting now with  #1Pancreatic mass along with retroperitoneal lymphadenopathy and diffuse splenic lesions. No internal necrosis within the mass noted. Diffuse splenomegaly. Left periaortic lymph node causing mild displacement of the third portion of the duodenum.  Significantly elevated LDH levels. CA 19-9 and CEA levels unrevealing  # 2 Left sided large pleural effusion and lung atelectasis. S/p diagnostic and therapeutic thoracentesis -- lymphocytic predominance @ 80%  Overall picture concerning for possible  High grade B cell lymphoma vs primary pancreatic malignancy (though CA 19-9 levels indeterminate and not significantly elevated.  HIV could be risk factors for high grade EBV driven lymphomas  Patient symptomatology has developed rather quickly over the last few weeks.  #3  microcytic anemia-chronic.Some element of iron deficiency versus hemoglobinopathy versus anemia of chronic disease related to malignancy. Patient has received 1 dose of IV Feraheme Will rpt 2nd dose in 1 week.  #4 history of HIV/AIDS follows with Dr. Melynda Keller and at [facility].  # Leucocytosis - due to G-CSF PLAN: -labs stable.  -PET/CT reviewed with patient -- 1. Stable splenic size and vague low-attenuation splenic lesions but no  areas of hypermetabolism are demonstrated. This would suggest a good response to treatment. (Deauville 1) 2. No hypermetabolic lymphadenopathy in the neck, chest, abdomen or pelvis. 3. Hypermetabolic thymic tissue most consistent with thymic rebound. 4. Stable midline lower abdominal skin lesions  -no evidence of active disease needing additional treatment at this time. -no indication for ISRT at this time -no residual toxicity from her lymphoma treatment -given letter to return to work on per diem basis on 11/21/2020 -dicussed survivorship considerations and f/u.  FOLLOW  UP: RTC with Dr Irene Limbo with labs in 3 months  The total time spent in the appt was 30 minutes and more than 50% was on counseling and direct patient cares.  All of the patient's questions were answered with apparent satisfaction. The patient knows to call the clinic with any problems, questions or concerns.   Sullivan Lone MD [place] AAHIVMS [facility] Hematology/Oncology Physician [facility]  (Office):       [phone] (Work cell):  [phone] (Fax):           [phone]  11/22/2020 10:45 PM  I, Yevette Edwards, am acting as a scribe for Dr. Sullivan Lone.   .I have reviewed the above documentation for accuracy and completeness, and I agree with the above. Brunetta Genera MD

## 2020-11-24 ENCOUNTER — Telehealth: Payer: Self-pay | Admitting: Hematology

## 2020-11-24 ENCOUNTER — Inpatient Hospital Stay: Payer: 59 | Admitting: Hematology

## 2020-11-24 NOTE — Telephone Encounter (Signed)
Scheduled appointment per 1/19 sch msg. Spoke to patient who is aware of appointment date and time.

## 2020-12-12 ENCOUNTER — Other Ambulatory Visit: Payer: Self-pay | Admitting: Hematology

## 2020-12-12 NOTE — Telephone Encounter (Signed)
Please review for refill.  

## 2021-02-15 ENCOUNTER — Telehealth: Payer: Self-pay | Admitting: Hematology

## 2021-02-15 NOTE — Telephone Encounter (Signed)
Left message with rescheduled upcoming appointment due to provider's PAL. Gave option to call back to reschedule if needed. 

## 2021-02-22 ENCOUNTER — Other Ambulatory Visit: Payer: 59

## 2021-02-22 ENCOUNTER — Ambulatory Visit: Payer: 59 | Admitting: Hematology

## 2021-02-23 ENCOUNTER — Other Ambulatory Visit: Payer: 59

## 2021-02-23 ENCOUNTER — Ambulatory Visit: Payer: 59 | Admitting: Hematology

## 2021-03-13 NOTE — Progress Notes (Signed)
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 female with a past medical history significant for hepatitis B, HIV, chronic anemia who presented to [facility] with abdominal pain.  She had a 2-week history of early satiety, poor appetite and, 5 pound weight loss.  Abdominal pain is typically postprandial and mainly at night after dinner while laying in bed.  Has been associated with nausea improves after vomiting.  Labs on admission showed a hemoglobin of 9.3, MCV 67.4, platelet count 554,000.  Lipase was mildly elevated at 123.  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  Husband was at the bedside at the time of the visit.  The patient reports that she is having ongoing abdominal discomfort today.  Pain is primarily in her epigastric area and radiates to the left side of her abdomen.  States pain is worse when laying down after she eats.  She states that she has had this pain for at least 2 weeks.  Her abdominal pain is worse after eating.  She has had some nausea and intermittent vomiting.  Vomiting makes her pain better.  She was attributing her symptoms to some the medications that she was taking for fertility treatments.  She reports a poor appetite and about a 5 pound weight loss.  She is not  having any fevers or chills.  Denies night sweats.  She has not noticed any palpable lymphadenopathy.  She denies headaches, dizziness, chest pain, shortness of breath, diarrhea.  She reports intermittent constipation.  Denies bleeding or lower extremity edema.  The patient is married and has no children.  She currently works as an Therapist, sports.  Denies alcohol tobacco use.  Family history significant for paternal grandmother with uterine cancer.  Medical oncology was asked see the patient to make recommendations regarding her abnormal CT scan findings.  INTERVAL HISTORY:  Heather [name] is a 45 year old female who is here for evaluation and management of Large B-cell lymphoma. The patient's last visit with [place] was on 11/16/2020. The pt reports that she is doing well overall. We are joined today by her husband.  The pt reports that she has been doing well with no symptoms or concerns. The pt notes she has been recovering overall and her energy has been improving. She notes that she is very busy. The pt notes her hot flashes have still been occurring and are very bothersome to the pt. She has not had any periods. The pt notes the hot flashes make it hard to sleep at night. She notes her sleep gets very disturbed with these flashes. 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 She notes she experiences intermittent joint pain, mostly in her knees. The pt denies nay medication changes. The pt has been back to work prn and it has been mostly 2-3 days weekly. She is able to pace herself and get  the rest needed for recovery. The pt notes she is attempting to switch back to part time to only work a consistent two days per week.  Lab results today 03/14/2021 of CBC w/diff and CMP is as follows: all values are WNL except for WBC of 2.1K, RDW of 17.2, Neutro Abs of 0.6K, Glucose of 113. 03/14/2021 LDH of 248.  10/24/2020 Hepatitis B undetectable.  10/24/2020 HIV undetectable.   On review of  systems, pt reports hot flashes, intermittent joint pain and denies new lumps/bumps, abdominal pain, decreased appetite, sudden weight loss, leg swelling, back pain, and any other symptoms.  MEDICAL HISTORY:  Past Medical History:  Diagnosis Date  . Anemia   . Diffuse large B-cell lymphoma of lymph nodes of multiple regions ([place]) 04/12/2020  . 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 History of blood transfusion    childhood  . HIV (human immunodeficiency virus infection) ([place])   . Immune deficiency disorder [facility])     SURGICAL HISTORY: Past Surgical History:  Procedure Laterality Date  . CHROMOPERTUBATION Bilateral 11/29/2016   Procedure: CHROMOPERTUBATION;  Surgeon: Waymon Amato, MD;  Location: [facility];  Service: Gynecology;  Laterality: Bilateral;  fallopian tubes  . ENDOMETRIAL BIOPSY    . IR IMAGING GUIDED PORT INSERTION  04/15/2020  . IR THORACENTESIS ASP PLEURAL SPACE W/IMG GUIDE  04/15/2020  . MYOMECTOMY N/A 11/29/2016   Procedure: MYOMECTOMY;  Surgeon: Waymon Amato, MD;  Location: [facility];  Service: Gynecology;  Laterality: N/A;    SOCIAL HISTORY: Social History   Socioeconomic History  . Marital status: Married    Spouse name: Not on file  . Number of children: Not on file  . Years of education: Not on file  . Highest education level: Not on file  Occupational History  . Not on file  Tobacco Use  . Smoking status: Never Smoker  . Smokeless tobacco: Never Used  Substance and Sexual Activity  . Alcohol use: Yes    Comment: occ  . Drug use: No  . Sexual activity: Yes  Other Topics Concern  . Not on file  Social History Narrative  . Not on file   Social Determinants of Health   Financial Resource Strain: Not on file  Food Insecurity: Not on file  Transportation Needs: Not on file  Physical Activity: Not on file  Stress: Not on file  Social Connections: Not on file  Intimate Partner Violence: Not on file    FAMILY HISTORY: Family History  Problem Relation Age  of Onset  . Diabetes Mother   . Hypertension Mother     ALLERGIES:  has No Known Allergies.  MEDICATIONS:  Current Outpatient Medications  Medication Sig Dispense Refill  . acetaminophen (TYLENOL) 500 MG tablet Take 500 mg by mouth every 6 (six) hours as needed for mild pain or headache.    Marland Kitchen acyclovir (ZOVIRAX) 400 MG tablet TAKE 1 TABLET BY MOUTH TWICE A DAY 180 tablet 2  . B Complex Vitamins (B COMPLEX PO) Take 1 tablet by mouth daily.    . bictegravir-emtricitabine-tenofovir AF (BIKTARVY) 50-200-25 MG TABS tablet Take 1 tablet by mouth daily with breakfast.     . cholecalciferol (VITAMIN D3) 25 MCG (1000 UNIT) tablet Take 1 tablet (1,000 Units total) by mouth at bedtime.    . feeding supplement, ENSURE ENLIVE, (ENSURE ENLIVE) LIQD Take 237 mLs by mouth 2 (two) times daily between meals. 237 mL 12  . HYDROcodone-acetaminophen (NORCO) 5-325 MG tablet Take 1 tablet  by mouth every 6 (six) hours as needed for moderate pain. 30 tablet 0  . ibuprofen (ADVIL) 200 MG tablet Take 2 tablets (400 mg total) by mouth every 6 (six) hours as needed for mild pain. (Patient not taking: Reported on 06/20/2020) 30 tablet 0  . lidocaine-prilocaine (EMLA) cream Apply to affected area once (Patient taking differently: Apply 1 application topically as needed (port access). Apply to affected area once) 30 g 3  . LORazepam (ATIVAN) 0.5 MG tablet Take 1 tablet (0.5 mg total) by mouth every 6 (six) hours as needed (for chemo induced nausea or vomiting). 30 tablet 0  . Multiple Vitamin (MULTIVITAMIN WITH MINERALS) TABS tablet Take 1 tablet by mouth at bedtime.     . ondansetron (ZOFRAN) 8 MG tablet Take 1 tablet (8 mg total) by mouth every 8 (eight) hours as needed for nausea or vomiting. 30 tablet 1  . polyethylene glycol (MIRALAX / GLYCOLAX) 17 g packet Take 17 g by mouth daily as needed for mild constipation. 14 each 0  . prochlorperazine (COMPAZINE) 10 MG tablet Take 1 tablet (10 mg total) by mouth every 6 (six)  hours as needed for nausea or vomiting. 30 tablet 1  . senna-docusate (SENOKOT-S) 8.6-50 MG tablet Take 2 tablets by mouth at bedtime as needed for mild constipation.    . Sodium Chloride-Sodium Bicarb (SODIUM BICARBONATE/SODIUM CHLORIDE) SOLN 1 application by Mouth Rinse route 4 (four) times daily.     No current facility-administered medications for this visit.    REVIEW OF SYSTEMS:   10 Point review of Systems was done is negative except as noted above.  PHYSICAL EXAMINATION: ECOG PERFORMANCE STATUS: 1 - Symptomatic but completely ambulatory  . Vitals:   03/14/21 1007  BP: 132/89  Pulse: 82  Resp: 20  Temp: (!) 96.7 F (35.9 C)  SpO2: 100%   Filed Weights   03/14/21 1007  Weight: 158 lb 9.6 oz (71.9 kg)   .Body mass index is 30.97 kg/m.   GENERAL:alert, in no acute distress and comfortable SKIN: no acute rashes, no significant lesions EYES: conjunctiva are pink and non-injected, sclera anicteric OROPHARYNX: MMM, no exudates, no oropharyngeal erythema or ulceration NECK: supple, no JVD LYMPH:  no palpable lymphadenopathy in the cervical, axillary or inguinal regions LUNGS: clear to auscultation b/l with normal respiratory effort HEART: regular rate & rhythm ABDOMEN:  normoactive bowel sounds , non tender, not distended. Extremity: no pedal edema PSYCH: alert & oriented x 3 with fluent speech NEURO: no focal motor/sensory deficits  LABORATORY DATA:  I have reviewed the data as listed  . CBC Latest Ref Rng & Units 03/14/2021 11/16/2020 09/02/2020  WBC 4.0 - 10.5 K/uL 2.1(L) 3.2(L) 3.3(L)  Hemoglobin 12.0 - 15.0 g/dL 12.2 12.3 11.0(L)  Hematocrit 36.0 - 46.0 % 38.6 38.0 35.1(L)  Platelets 150 - 400 K/uL 200 205 227    . CMP Latest Ref Rng & Units 03/14/2021 11/16/2020 09/02/2020  Glucose 70 - 99 mg/dL 113(H) 103(H) 97  BUN 6 - 20 mg/dL 14 15 13   Creatinine 0.44 - 1.00 mg/dL 0.91 0.74 0.79  Sodium 135 - 145 mmol/L 143 141 143  Potassium 3.5 - 5.1 mmol/L 4.4 3.9  4.5  Chloride 98 - 111 mmol/L 106 105 106  CO2 22 - 32 mmol/L 27 28 30   Calcium 8.9 - 10.3 mg/dL 10.0 10.2 10.2  Total Protein 6.5 - 8.1 g/dL 7.0 7.2 6.7  Total Bilirubin 0.3 - 1.2 mg/dL 0.3 0.3 0.4  Alkaline Phos 38 -  126 U/L 93 99 93  AST 15 - 41 U/L 21 19 20   ALT 0 - 44 U/L 17 15 18    . Lab Results  Component Value Date   LDH 248 (H) 03/14/2021     04/08/2020 Pancreatic Mass Surgical Pathology Report [phone]):     RADIOGRAPHIC STUDIES: I have personally reviewed the radiological images as listed and agreed with the findings in the report. No results found.  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 Hemoglobin C], childhood malaria. Patient notes that she had a CT of the abdomen sometime in 2020 that showed no acute pathology.This was not [facility]. Not accessible to [place] at this point. She did have an MRI of the abdomen in July 2019 which showed indeterminate splenic lesions.No other acute abdominal pathology.No concern with hepatocellular carcinoma.  Patient is presenting now with  #1Pancreatic mass along with retroperitoneal lymphadenopathy and diffuse splenic lesions. No internal necrosis within the mass noted. Diffuse splenomegaly. Left periaortic lymph node causing mild displacement of the third portion of the duodenum.  Significantly elevated LDH levels. CA 19-9 and CEA levels unrevealing  Overall picture concerning for possible  High grade B cell lymphoma vs primary pancreatic malignancy (though CA 19-9 levels indeterminate and not significantly elevated.  HIV could be risk factors for high grade EBV driven lymphomas  Patient symptomatology has developed rather quickly over the  last few weeks.  #2 microcytic anemia-chronic.Some element of iron deficiency versus hemoglobinopathy versus anemia of chronic disease related to malignancy.  #3 history of HIV/AIDS 10/2020 VL undetectable follows with Dr. Melynda Keller and at [facility]. #4 h/o Hepatitis B - 10/2020 VL undetectable  PLAN: -Discussed pt labwork today, 03/14/2021; mild leukopenia, Plt normal, Hgb normal, chemistries normal. -Advised pt her hot flashes seem to be due to menopausal changes. Recommended pt discuss this with OBGYN if too bothersome. -Advised pt her neutropenia is most likely due to [place]. This was stable since December and not acute. -Recommended pt receive the second COVID booster shot as recently approved. The pt notes she has already received this. -Discussed Evusheld and pt's eligibility. Will send referral. -Recommended burn gel or aloe plus lidocaine on burn spot on pt's arm. Hydrogel dressing. -Recommended pt stay physically active, drink 48-64 oz water daily, and avoid caffeine. -Recommended pt start 400 IU Vitamin E daily. -Advised pt that she is nearly 8 months out from treatment (August 01, 2020 was last EPOCH-R). -Will provide letter for pt to be able to return to part-time work. -Continue to monitor neutropenia. The pt will be getting labs next month with her infectious disease doctor to monitor this. -Will see back in 3 months with labs.  FOLLOW  UP: RTC with Dr Irene Limbo with labs in 3 months Referral for Evusheld  The total time spent in the appt was 20 minutes and more than 50% was on counseling and direct patient cares.  All of the patient's questions were answered with apparent satisfaction. The patient knows to call the clinic with any problems, questions or concerns.   Sullivan Lone MD [place] AAHIVMS [facility] Hematology/Oncology Physician [facility]  (Office):       [phone] (Work cell):  [phone] (Fax):  [phone]  03/14/2021 11:02 AM  I, Reinaldo Raddle, am acting as scribe for Dr. Sullivan Lone, MD.    .I have reviewed the above documentation for accuracy and completeness, and I agree with the above. Brunetta Genera MD

## 2021-03-14 ENCOUNTER — Inpatient Hospital Stay (HOSPITAL_BASED_OUTPATIENT_CLINIC_OR_DEPARTMENT_OTHER): Payer: 59 | Admitting: Hematology

## 2021-03-14 ENCOUNTER — Other Ambulatory Visit: Payer: Self-pay

## 2021-03-14 ENCOUNTER — Inpatient Hospital Stay: Payer: 59 | Attending: Hematology

## 2021-03-14 VITALS — BP 132/89 | HR 82 | Temp 96.7°F | Resp 20 | Ht 60.0 in | Wt 158.6 lb

## 2021-03-14 DIAGNOSIS — D509 Iron deficiency anemia, unspecified: Secondary | ICD-10-CM | POA: Diagnosis not present

## 2021-03-14 DIAGNOSIS — D709 Neutropenia, unspecified: Secondary | ICD-10-CM | POA: Insufficient documentation

## 2021-03-14 DIAGNOSIS — D702 Other drug-induced agranulocytosis: Secondary | ICD-10-CM | POA: Diagnosis not present

## 2021-03-14 DIAGNOSIS — R7402 Elevation of levels of lactic acid dehydrogenase (LDH): Secondary | ICD-10-CM | POA: Diagnosis not present

## 2021-03-14 DIAGNOSIS — K869 Disease of pancreas, unspecified: Secondary | ICD-10-CM | POA: Insufficient documentation

## 2021-03-14 DIAGNOSIS — C8338 Diffuse large B-cell lymphoma, lymph nodes of multiple sites: Secondary | ICD-10-CM | POA: Diagnosis not present

## 2021-03-14 DIAGNOSIS — D7389 Other diseases of spleen: Secondary | ICD-10-CM | POA: Diagnosis not present

## 2021-03-14 DIAGNOSIS — R590 Localized enlarged lymph nodes: Secondary | ICD-10-CM | POA: Insufficient documentation

## 2021-03-14 DIAGNOSIS — N951 Menopausal and female climacteric states: Secondary | ICD-10-CM | POA: Insufficient documentation

## 2021-03-14 DIAGNOSIS — Z21 Asymptomatic human immunodeficiency virus [HIV] infection status: Secondary | ICD-10-CM | POA: Diagnosis not present

## 2021-03-14 DIAGNOSIS — G629 Polyneuropathy, unspecified: Secondary | ICD-10-CM | POA: Insufficient documentation

## 2021-03-14 LAB — CBC WITH DIFFERENTIAL/PLATELET
Abs Immature Granulocytes: 0.03 10*3/uL (ref 0.00–0.07)
Basophils Absolute: 0 10*3/uL (ref 0.0–0.1)
Basophils Relative: 1 %
Eosinophils Absolute: 0.1 10*3/uL (ref 0.0–0.5)
Eosinophils Relative: 6 %
HCT: 38.6 % (ref 36.0–46.0)
Hemoglobin: 12.2 g/dL (ref 12.0–15.0)
Immature Granulocytes: 1 %
Lymphocytes Relative: 46 %
Lymphs Abs: 1 10*3/uL (ref 0.7–4.0)
MCH: 26.8 pg (ref 26.0–34.0)
MCHC: 31.6 g/dL (ref 30.0–36.0)
MCV: 84.8 fL (ref 80.0–100.0)
Monocytes Absolute: 0.4 10*3/uL (ref 0.1–1.0)
Monocytes Relative: 17 %
Neutro Abs: 0.6 10*3/uL — ABNORMAL LOW (ref 1.7–7.7)
Neutrophils Relative %: 29 %
Platelets: 200 10*3/uL (ref 150–400)
RBC: 4.55 MIL/uL (ref 3.87–5.11)
RDW: 17.2 % — ABNORMAL HIGH (ref 11.5–15.5)
WBC: 2.1 10*3/uL — ABNORMAL LOW (ref 4.0–10.5)
nRBC: 0 % (ref 0.0–0.2)

## 2021-03-14 LAB — CMP (CANCER CENTER ONLY)
ALT: 17 U/L (ref 0–44)
AST: 21 U/L (ref 15–41)
Albumin: 4.2 g/dL (ref 3.5–5.0)
Alkaline Phosphatase: 93 U/L (ref 38–126)
Anion gap: 10 (ref 5–15)
BUN: 14 mg/dL (ref 6–20)
CO2: 27 mmol/L (ref 22–32)
Calcium: 10 mg/dL (ref 8.9–10.3)
Chloride: 106 mmol/L (ref 98–111)
Creatinine: 0.91 mg/dL (ref 0.44–1.00)
GFR, Estimated: >60 mL/min (ref >60)
Glucose, Bld: 113 mg/dL — ABNORMAL HIGH (ref 70–99)
Potassium: 4.4 mmol/L (ref 3.5–5.1)
Sodium: 143 mmol/L (ref 135–145)
Total Bilirubin: 0.3 mg/dL (ref 0.3–1.2)
Total Protein: 7 g/dL (ref 6.5–8.1)

## 2021-03-14 LAB — LACTATE DEHYDROGENASE: LDH: 248 U/L — ABNORMAL HIGH (ref 98–192)

## 2021-03-15 ENCOUNTER — Telehealth: Payer: Self-pay | Admitting: Hematology

## 2021-03-15 NOTE — Telephone Encounter (Signed)
Left message with follow-up appointment per 5/10 los. Gave option to call back to reschedule if needed.

## 2021-03-19 ENCOUNTER — Telehealth: Payer: Self-pay | Admitting: Physician Assistant

## 2021-03-19 ENCOUNTER — Other Ambulatory Visit: Payer: Self-pay | Admitting: Physician Assistant

## 2021-03-19 DIAGNOSIS — B20 Human immunodeficiency virus [HIV] disease: Secondary | ICD-10-CM

## 2021-03-19 DIAGNOSIS — C8338 Diffuse large B-cell lymphoma, lymph nodes of multiple sites: Secondary | ICD-10-CM

## 2021-03-19 NOTE — Progress Notes (Signed)
I connected by phone with Bryan Lemma on 03/19/2021, 8:45 PM to discuss the potential use of a new treatment, tixagevimab/cilgavimab, for pre-exposure prophylaxis for prevention of coronavirus disease 2019 (COVID-19) caused by the SARS-CoV-2 virus.  This patient is a 45 y.o. female that meets the FDA criteria for Emergency Use Authorization of tixagevimab/cilgavimab for pre-exposure prophylaxis of COVID-19 disease. Pt meets following criteria:  Age >12 yr and weight > 40kg  Not currently infected with SARS-CoV-2 and has no known recent exposure to an individual infected with SARS-CoV-2 AND o Who has moderate to severe immune compromise due to a medical condition or receipt of immunosuppressive medications or treatments and may not mount an adequate immune response to COVID-19 vaccination or  o Vaccination with any available COVID-19 vaccine, according to the approved or authorized schedule, is not recommended due to a history of severe adverse reaction (e.g., severe allergic reaction) to a COVID-19 vaccine(s) and/or COVID-19 vaccine component(s).  o Patient meets the following definition of mod-severe immune compromised status: 1. Received B-cell depleting therapies (e.g. rituximab, obinutuzumab, ocrelizumab, alemtuzumab) within last 6 months & age > or = 92 and 6. Other actively treated hematologic malignancies or severe congenital immunodeficiency syndromes  I have spoken and communicated the following to the patient or parent/caregiver regarding COVID monoclonal antibody treatment:  1. FDA has authorized the emergency use of tixagevimab/cilgavimab for the pre-exposure prophylaxis of COVID-19 in patients with moderate-severe immunocompromised status, who meet above EUA criteria.  2. The significant known and potential risks and benefits of COVID monoclonal antibody, and the extent to which such potential risks and benefits are unknown.  3. Information on available alternative treatments and the  risks and benefits of those alternatives, including clinical trials.  4. The patient or parent/caregiver has the option to accept or refuse COVID monoclonal antibody treatment.  After reviewing this information with the patient, agree to receive tixagevimab/cilgavimab  Angelena Form, PA-C, 03/19/2021, 8:45 PM

## 2021-03-19 NOTE — Telephone Encounter (Signed)
Called pt about Evusheld (tixagevimab co-packaged with cilgavimab) for pre-exposure prophylaxis for prevention of coronavirus disease 2019 (COVID-19) caused by the SARS-CoV-2 virus. The patient is a candidate for this therapy given increased risk for severe disease caused by immunosuppression.    Unable to reach pt- sent mychart and left VM  Angelena Form PA-C  [facility]

## 2021-03-20 ENCOUNTER — Telehealth: Payer: Self-pay | Admitting: Hematology

## 2021-03-20 NOTE — Telephone Encounter (Signed)
Scheduled appt per 5/15 sch msg. Called pt, no answer. Left msg with appt date and time.

## 2021-03-21 ENCOUNTER — Ambulatory Visit: Payer: 59

## 2021-03-21 ENCOUNTER — Telehealth: Payer: Self-pay | Admitting: Hematology

## 2021-03-21 NOTE — Telephone Encounter (Signed)
Cancelled appt per 5/17 sch msg. Pt said she would call back to r/s.

## 2021-06-12 ENCOUNTER — Encounter: Payer: Self-pay | Admitting: Hematology

## 2021-06-12 ENCOUNTER — Telehealth: Payer: Self-pay | Admitting: Hematology

## 2021-06-12 NOTE — Telephone Encounter (Signed)
R/s appts per 8/8 sch msg. Pt aware.

## 2021-06-13 ENCOUNTER — Other Ambulatory Visit: Payer: 59

## 2021-06-13 ENCOUNTER — Ambulatory Visit: Payer: 59 | Admitting: Hematology

## 2021-07-07 ENCOUNTER — Inpatient Hospital Stay: Payer: 59 | Attending: Hematology

## 2021-07-07 ENCOUNTER — Inpatient Hospital Stay (HOSPITAL_BASED_OUTPATIENT_CLINIC_OR_DEPARTMENT_OTHER): Payer: 59 | Admitting: Hematology

## 2021-07-07 ENCOUNTER — Other Ambulatory Visit: Payer: Self-pay

## 2021-07-07 VITALS — BP 130/94 | HR 89 | Temp 99.0°F | Resp 20 | Wt 161.3 lb

## 2021-07-07 DIAGNOSIS — C8338 Diffuse large B-cell lymphoma, lymph nodes of multiple sites: Secondary | ICD-10-CM | POA: Diagnosis not present

## 2021-07-07 DIAGNOSIS — Z21 Asymptomatic human immunodeficiency virus [HIV] infection status: Secondary | ICD-10-CM | POA: Insufficient documentation

## 2021-07-07 DIAGNOSIS — C8510 Unspecified B-cell lymphoma, unspecified site: Secondary | ICD-10-CM | POA: Diagnosis present

## 2021-07-07 LAB — CBC WITH DIFFERENTIAL/PLATELET
Abs Immature Granulocytes: 0.01 10*3/uL (ref 0.00–0.07)
Basophils Absolute: 0 10*3/uL (ref 0.0–0.1)
Basophils Relative: 1 %
Eosinophils Absolute: 0.2 10*3/uL (ref 0.0–0.5)
Eosinophils Relative: 6 %
HCT: 39.2 % (ref 36.0–46.0)
Hemoglobin: 13.1 g/dL (ref 12.0–15.0)
Immature Granulocytes: 0 %
Lymphocytes Relative: 40 %
Lymphs Abs: 1.3 10*3/uL (ref 0.7–4.0)
MCH: 26.8 pg (ref 26.0–34.0)
MCHC: 33.4 g/dL (ref 30.0–36.0)
MCV: 80.3 fL (ref 80.0–100.0)
Monocytes Absolute: 0.2 10*3/uL (ref 0.1–1.0)
Monocytes Relative: 7 %
Neutro Abs: 1.5 10*3/uL — ABNORMAL LOW (ref 1.7–7.7)
Neutrophils Relative %: 46 %
Platelets: 204 10*3/uL (ref 150–400)
RBC: 4.88 MIL/uL (ref 3.87–5.11)
RDW: 15.8 % — ABNORMAL HIGH (ref 11.5–15.5)
WBC: 3.3 10*3/uL — ABNORMAL LOW (ref 4.0–10.5)
nRBC: 0 % (ref 0.0–0.2)

## 2021-07-07 LAB — CMP (CANCER CENTER ONLY)
ALT: 21 U/L (ref 0–44)
AST: 23 U/L (ref 15–41)
Albumin: 4.3 g/dL (ref 3.5–5.0)
Alkaline Phosphatase: 100 U/L (ref 38–126)
Anion gap: 10 (ref 5–15)
BUN: 13 mg/dL (ref 6–20)
CO2: 25 mmol/L (ref 22–32)
Calcium: 10.1 mg/dL (ref 8.9–10.3)
Chloride: 107 mmol/L (ref 98–111)
Creatinine: 0.8 mg/dL (ref 0.44–1.00)
GFR, Estimated: >60 mL/min (ref >60)
Glucose, Bld: 119 mg/dL — ABNORMAL HIGH (ref 70–99)
Potassium: 3.7 mmol/L (ref 3.5–5.1)
Sodium: 142 mmol/L (ref 135–145)
Total Bilirubin: 0.4 mg/dL (ref 0.3–1.2)
Total Protein: 7.2 g/dL (ref 6.5–8.1)

## 2021-07-07 LAB — LACTATE DEHYDROGENASE: LDH: 237 U/L — ABNORMAL HIGH (ref 98–192)

## 2021-07-11 ENCOUNTER — Inpatient Hospital Stay: Payer: 59

## 2021-07-11 ENCOUNTER — Telehealth: Payer: Self-pay | Admitting: Hematology

## 2021-07-11 ENCOUNTER — Other Ambulatory Visit: Payer: Self-pay

## 2021-07-11 DIAGNOSIS — C8510 Unspecified B-cell lymphoma, unspecified site: Secondary | ICD-10-CM | POA: Diagnosis not present

## 2021-07-11 DIAGNOSIS — Z95828 Presence of other vascular implants and grafts: Secondary | ICD-10-CM

## 2021-07-11 MED ORDER — SODIUM CHLORIDE 0.9% FLUSH
10.0000 mL | Freq: Once | INTRAVENOUS | Status: AC
  Administered 2021-07-11: 10 mL

## 2021-07-11 MED ORDER — HEPARIN SOD (PORK) LOCK FLUSH 100 UNIT/ML IV SOLN
500.0000 [IU] | Freq: Once | INTRAVENOUS | Status: AC
  Administered 2021-07-11: 500 [IU]

## 2021-07-11 NOTE — Telephone Encounter (Signed)
Left message with follow-up appointment per 9/2 los. 

## 2021-07-13 ENCOUNTER — Encounter: Payer: Self-pay | Admitting: Hematology

## 2021-07-13 NOTE — Progress Notes (Signed)
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 female with a past medical history significant for hepatitis B, HIV, chronic anemia who presented to [place] with abdominal pain.  She had a 2-week history of early satiety, poor appetite and, 5 pound weight loss.  Abdominal pain is typically postprandial and mainly at night after dinner while laying in bed.  Has been associated with nausea improves after vomiting.  Labs on admission showed a hemoglobin of 9.3, MCV 67.4, platelet count 554,000.  Lipase was mildly elevated at 123.  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   Husband was at the bedside at the time of the visit.  The patient reports that she is having ongoing abdominal discomfort today.  Pain is primarily in her epigastric area and radiates to the left side of her abdomen.  States pain is worse when laying down after she eats.  She states that she has had this pain for at least 2 weeks.  Her abdominal pain is worse after eating.  She has had some nausea and intermittent vomiting.  Vomiting makes her pain better.  She was attributing her symptoms to some the medications that she was taking for fertility treatments.  She reports a poor appetite and about a 5 pound weight loss.  She is not  having any fevers or chills.  Denies night sweats.  She has not noticed any palpable lymphadenopathy.  She denies headaches, dizziness, chest pain, shortness of breath, diarrhea.  She reports intermittent constipation.  Denies bleeding or lower extremity edema.  The patient is married and has no children.  She currently works as an Therapist, sports.  Denies alcohol tobacco use.  Family history significant for paternal grandmother with uterine cancer.  Medical oncology was asked see the patient to make recommendations regarding her abnormal CT scan findings.  INTERVAL HISTORY:  Heather [name] is a 45 year old female who is here for evaluation and management of Large B-cell lymphoma. The patient's last visit with [place] was on 03/14/2021. The pt reports that she is doing well overall.   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  No acute new symptoms.  No fevers no chills no night sweats no unexpected weight loss.  No new lumps or bumps. No new infection issues. Minimal residual neuropathy. No new chest pain or shortness of breath.  No new abdominal pain or distention.  Lab results today 07/07/2021  CBC w/diff -stable improvement in neutropenia from an [place] of 600 to 1500, hemoglobin is within normal limits at 13.1 . CMP stable and within normal limits LDH stable at 237.  Was previously 51.   MEDICAL HISTORY:  Past Medical History:  Diagnosis Date   Anemia    Diffuse large B-cell lymphoma of lymph nodes of multiple regions ([place]) 04/12/2020   Hep B w/o coma, chronic, w/o delta (  [place])    History of blood transfusion    childhood   HIV (human immunodeficiency virus infection) ([place])    Immune deficiency disorder ([place])     SURGICAL HISTORY: Past Surgical History:  Procedure Laterality Date   CHROMOPERTUBATION Bilateral 11/29/2016   Procedure: CHROMOPERTUBATION;  Surgeon: Waymon Amato, MD;  Location: [facility];  Service: Gynecology;  Laterality: Bilateral;  fallopian tubes    ENDOMETRIAL BIOPSY     IR IMAGING GUIDED PORT INSERTION  04/15/2020   IR THORACENTESIS ASP PLEURAL SPACE W/IMG GUIDE  04/15/2020   MYOMECTOMY N/A 11/29/2016   Procedure: MYOMECTOMY;  Surgeon: Waymon Amato, MD;  Location: [facility];  Service: Gynecology;  Laterality: N/A;    SOCIAL HISTORY: Social History   Socioeconomic History   Marital status: Married    Spouse name: Not on file   Number of children: Not on file   Years of education: Not on file   Highest education level: Not on file  Occupational History   Not on file  Tobacco Use   Smoking status: Never   Smokeless tobacco: Never  Substance and Sexual Activity   Alcohol use: Yes    Comment: occ   Drug use: No   Sexual activity: Yes  Other Topics Concern   Not on file  Social History Narrative   Not on file   Social Determinants of Health   Financial Resource Strain: Not on file  Food Insecurity: Not on file  Transportation Needs: Not on file  Physical Activity: Not on file  Stress: Not on file  Social Connections: Not on file  Intimate Partner Violence: Not on file    FAMILY HISTORY: Family History  Problem Relation Age of Onset   Diabetes Mother    Hypertension Mother     ALLERGIES:  has No Known Allergies.  MEDICATIONS:  Current Outpatient Medications  Medication Sig Dispense Refill   acetaminophen (TYLENOL) 500 MG tablet Take 500 mg by mouth every 6 (six) hours as needed for mild pain or headache.     acyclovir (ZOVIRAX) 400 MG tablet TAKE 1 TABLET BY MOUTH TWICE A DAY 180 tablet 2   B Complex Vitamins (B COMPLEX PO) Take 1 tablet by mouth daily.     bictegravir-emtricitabine-tenofovir AF (BIKTARVY) 50-200-25 MG TABS tablet Take 1 tablet by mouth daily with breakfast.      cholecalciferol (VITAMIN D3) 25 MCG (1000 UNIT) tablet Take 1 tablet (1,000 Units total) by mouth at bedtime.     feeding supplement, ENSURE ENLIVE, (ENSURE ENLIVE) LIQD Take 237 mLs by mouth 2 (two) times daily between meals. 237 mL 12    HYDROcodone-acetaminophen (NORCO) 5-325 MG tablet Take 1 tablet by mouth every 6 (six) hours as needed for moderate pain. 30 tablet 0   ibuprofen (ADVIL) 200 MG tablet Take 2 tablets (400 mg total) by mouth every 6 (six) hours as needed for mild pain. (Patient not taking: Reported on 06/20/2020) 30 tablet 0   lidocaine-prilocaine (EMLA) cream Apply to affected area once (Patient taking differently: Apply 1 application topically as needed (port access). Apply to affected area once) 30 g 3   LORazepam (ATIVAN) 0.5 MG tablet Take 1 tablet (0.5 mg total) by mouth every 6 (six) hours as needed (for chemo induced nausea or vomiting). 30 tablet 0   Multiple Vitamin (MULTIVITAMIN WITH MINERALS) TABS tablet Take 1 tablet by mouth at bedtime.      ondansetron (ZOFRAN) 8 MG tablet Take 1 tablet (8 mg total) by mouth every 8 (  eight) hours as needed for nausea or vomiting. 30 tablet 1   polyethylene glycol (MIRALAX / GLYCOLAX) 17 g packet Take 17 g by mouth daily as needed for mild constipation. 14 each 0   prochlorperazine (COMPAZINE) 10 MG tablet Take 1 tablet (10 mg total) by mouth every 6 (six) hours as needed for nausea or vomiting. 30 tablet 1   senna-docusate (SENOKOT-S) 8.6-50 MG tablet Take 2 tablets by mouth at bedtime as needed for mild constipation.     Sodium Chloride-Sodium Bicarb (SODIUM BICARBONATE/SODIUM CHLORIDE) SOLN 1 application by Mouth Rinse route 4 (four) times daily.     No current facility-administered medications for this visit.    REVIEW OF SYSTEMS:   .10 Point review of Systems was done is negative except as noted above.  PHYSICAL EXAMINATION: ECOG PERFORMANCE STATUS: 1 - Symptomatic but completely ambulatory  . Vitals:   07/07/21 1138  BP: (!) 130/94  Pulse: 89  Resp: 20  Temp: 99 F (37.2 C)  SpO2: 100%   Filed Weights   07/07/21 1138  Weight: 161 lb 4.8 oz (73.2 kg)   .Body mass index is 31.5 kg/m.  Marland Kitchen GENERAL:alert, in no acute distress and comfortable SKIN: no  acute rashes, no significant lesions EYES: conjunctiva are pink and non-injected, sclera anicteric OROPHARYNX: MMM, no exudates, no oropharyngeal erythema or ulceration NECK: supple, no JVD LYMPH:  no palpable lymphadenopathy in the cervical, axillary or inguinal regions LUNGS: clear to auscultation b/l with normal respiratory effort HEART: regular rate & rhythm ABDOMEN:  normoactive bowel sounds , non tender, not distended. Extremity: no pedal edema PSYCH: alert & oriented x 3 with fluent speech NEURO: no focal motor/sensory deficits   LABORATORY DATA:  I have reviewed the data as listed  . CBC Latest Ref Rng & Units 07/07/2021 03/14/2021 11/16/2020  WBC 4.0 - 10.5 K/uL 3.3(L) 2.1(L) 3.2(L)  Hemoglobin 12.0 - 15.0 g/dL 13.1 12.2 12.3  Hematocrit 36.0 - 46.0 % 39.2 38.6 38.0  Platelets 150 - 400 K/uL 204 200 205    . CMP Latest Ref Rng & Units 07/07/2021 03/14/2021 11/16/2020  Glucose 70 - 99 mg/dL 119(H) 113(H) 103(H)  BUN 6 - 20 mg/dL '13 14 15  '$ Creatinine 0.44 - 1.00 mg/dL 0.80 0.91 0.74  Sodium 135 - 145 mmol/L 142 143 141  Potassium 3.5 - 5.1 mmol/L 3.7 4.4 3.9  Chloride 98 - 111 mmol/L 107 106 105  CO2 22 - 32 mmol/L '25 27 28  '$ Calcium 8.9 - 10.3 mg/dL 10.1 10.0 10.2  Total Protein 6.5 - 8.1 g/dL 7.2 7.0 7.2  Total Bilirubin 0.3 - 1.2 mg/dL 0.4 0.3 0.3  Alkaline Phos 38 - 126 U/L 100 93 99  AST 15 - 41 U/L '23 21 19  '$ ALT 0 - 44 U/L '21 17 15   '$ . Lab Results  Component Value Date   LDH 237 (H) 07/07/2021     04/08/2020 Pancreatic Mass Surgical Pathology Report [id number]     RADIOGRAPHIC STUDIES: I have personally reviewed the radiological images as listed and agreed with the findings in the report. No results found.  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  Hemoglobin C], childhood malaria. Patient notes that she had a CT of the abdomen sometime in  2020 that showed no acute pathology.  This was not [facility].  Not accessible to [place] at this point. She did have an MRI of the abdomen in July 2019 which showed indeterminate splenic lesions.  No other acute abdominal pathology.  No concern with hepatocellular carcinoma.   Patient is presenting now with   #1Pancreatic mass along with retroperitoneal lymphadenopathy and diffuse splenic lesions. No internal necrosis within the mass noted. Diffuse splenomegaly. Left periaortic lymph node causing mild displacement of the third portion of the duodenum.     Significantly elevated LDH levels. CA 19-9 and CEA levels unrevealing  Overall picture concerning for possible  High grade B cell lymphoma vs primary pancreatic malignancy (though CA 19-9 levels indeterminate and not significantly elevated.  HIV could be risk factors for high grade EBV driven lymphomas   Patient symptomatology has developed rather quickly over the last few weeks.   #2 microcytic anemia-chronic.  Some element of iron deficiency versus hemoglobinopathy versus anemia of chronic disease related to malignancy.   #3 history of HIV/AIDS 10/2020 VL undetectable follows with Dr. Melynda Keller and at [place]. #4 h/o Hepatitis B - 10/2020 VL undetectable  PLAN: -Discussed pt labwork today, 07/07/2021 CBC and CMP stable LDH shows no increase to suggest lymphoma recurrence or progression. -Her previous neutropenia has resolved. -Advised pt that she is nearly 12 months out from treatment (August 01, 2020 was last EPOCH-R). -Patient has no laboratory or clinical evidence of large B-cell lymphoma progression/recurrence at this time. -We will continue active surveillance with H&P's and labs  -She was recommended to get the new COVID-19 booster  vaccine as well as a yearly flu shot when that comes out. Can-continue following with Dr. Azzie Roup for management of her HIV and hepatitis B -Discussed with patient about removal of Port-A-Cath patient wants to wait at this time.  FOLLOW  UP: Portflush today RTC with Dr Irene Limbo with portflush and labs in 3 months  . The total time spent in the appointment was 23 minutes and more than 50% was on counseling and direct patient cares.  All of the patient's questions were answered with apparent satisfaction. The patient knows to call the clinic with any problems, questions or concerns.   Sullivan Lone MD [place] AAHIVMS [facility] Hematology/Oncology Physician [facility]  (Office):       [phone] (Work cell):  [phone] (Fax):           [phone]

## 2021-07-21 ENCOUNTER — Encounter: Payer: Self-pay | Admitting: Hematology

## 2021-07-26 ENCOUNTER — Other Ambulatory Visit: Payer: Self-pay

## 2021-07-26 DIAGNOSIS — Z95828 Presence of other vascular implants and grafts: Secondary | ICD-10-CM

## 2021-09-27 ENCOUNTER — Other Ambulatory Visit: Payer: Self-pay | Admitting: Radiology

## 2021-09-29 NOTE — H&P (Signed)
Chief Complaint: Patient was seen in consultation today for port a catheter removal at the request of Dr. Carolyne Fiscal  Referring Physician(s): Brunetta Genera  Supervising Physician: Ruthann Cancer  Patient Status: [facility] - Out-pt  History of Present Illness: Heather [name] is a 45 y.o. female w/ PMH of anemia, B-cell lymphoma, Hep B, HIV and immune deficiency disorder. Dr. Irene Limbo has referred pt to IR for port a catheter removal d/t chemotherapy being completed.   Past Medical History:  Diagnosis Date   Anemia    Diffuse large B-cell lymphoma of lymph nodes of multiple regions ([place]) 04/12/2020   Hep B w/o coma, chronic, w/o delta ([facility])    History of blood transfusion    childhood   HIV (human immunodeficiency virus infection) ([place])    Immune deficiency disorder [facility])     Past Surgical History:  Procedure Laterality Date   CHROMOPERTUBATION Bilateral 11/29/2016   Procedure: CHROMOPERTUBATION;  Surgeon: Waymon Amato, MD;  Location: [facility];  Service: Gynecology;  Laterality: Bilateral;  fallopian tubes   ENDOMETRIAL BIOPSY     IR IMAGING GUIDED PORT INSERTION  04/15/2020   IR THORACENTESIS ASP PLEURAL SPACE W/IMG GUIDE  04/15/2020   MYOMECTOMY N/A 11/29/2016   Procedure: MYOMECTOMY;  Surgeon: Waymon Amato, MD;  Location: [facility];  Service: Gynecology;  Laterality: N/A;    Allergies: Benadryl allergy [diphenhydramine hcl]  Medications: Prior to Admission medications   Medication Sig Start Date End Date Taking? Authorizing Provider  acetaminophen (TYLENOL) 500 MG tablet Take 500 mg by mouth every 6 (six) hours as needed for mild pain or headache.    [provider]  acyclovir (ZOVIRAX) 400 MG tablet TAKE 1 TABLET BY MOUTH TWICE A DAY 12/12/20   Brunetta Genera, MD  B Complex Vitamins (B COMPLEX PO) Take 1 tablet by mouth daily.    [provider]  bictegravir-emtricitabine-tenofovir AF (BIKTARVY) 50-200-25 MG TABS tablet Take 1 tablet by mouth daily with breakfast.      [provider]  cholecalciferol (VITAMIN D3) 25 MCG (1000 UNIT) tablet Take 1 tablet (1,000 Units total) by mouth at bedtime. 05/13/20   Maryanna Shape, NP  feeding supplement, ENSURE ENLIVE, (ENSURE ENLIVE) LIQD Take 237 mLs by mouth 2 (two) times daily between meals. 04/22/20   Maryanna Shape, NP  HYDROcodone-acetaminophen (NORCO) 5-325 MG tablet Take 1 tablet by mouth every 6 (six) hours as needed for moderate pain. 05/12/20   Brunetta Genera, MD  ibuprofen (ADVIL) 200 MG tablet Take 2 tablets (400 mg total) by mouth every 6 (six) hours as needed for mild pain. Patient not taking: Reported on 06/20/2020 05/13/20   Maryanna Shape, NP  lidocaine-prilocaine (EMLA) cream Apply to affected area once Patient taking differently: Apply 1 application topically as needed (port access). Apply to affected area once 04/12/20   Brunetta Genera, MD  LORazepam (ATIVAN) 0.5 MG tablet Take 1 tablet (0.5 mg total) by mouth every 6 (six) hours as needed (for chemo induced nausea or vomiting). 04/21/20   Maryanna Shape, NP  Multiple Vitamin (MULTIVITAMIN WITH MINERALS) TABS tablet Take 1 tablet by mouth at bedtime.     [provider]  ondansetron (ZOFRAN) 8 MG tablet Take 1 tablet (8 mg total) by mouth every 8 (eight) hours as needed for nausea or vomiting. 07/26/20   Brunetta Genera, MD  polyethylene glycol (MIRALAX / GLYCOLAX) 17 g packet Take 17 g by mouth daily as needed for mild constipation. 04/22/20  Maryanna Shape, NP  prochlorperazine (COMPAZINE) 10 MG tablet Take 1 tablet (10 mg total) by mouth every 6 (six) hours as needed for nausea or vomiting. 04/21/20   Curcio, Roselie Awkward, NP  senna-docusate (SENOKOT-S) 8.6-50 MG tablet Take 2 tablets by mouth at bedtime as needed for mild constipation. 06/03/20   Maryanna Shape, NP  Sodium Chloride-Sodium Bicarb (SODIUM BICARBONATE/SODIUM CHLORIDE) SOLN 1 application by Mouth Rinse route 4 (four) times daily. 04/22/20   Maryanna Shape, NP     Family History  Problem Relation Age of Onset   Diabetes Mother    Hypertension Mother     Social History   Socioeconomic History   Marital status: Married    Spouse name: Not on file   Number of children: Not on file   Years of education: Not on file   Highest education level: Not on file  Occupational History   Not on file  Tobacco Use   Smoking status: Never   Smokeless tobacco: Never  Substance and Sexual Activity   Alcohol use: Yes    Comment: occ   Drug use: No   Sexual activity: Yes  Other Topics Concern   Not on file  Social History Narrative   Not on file   Social Determinants of Health   Financial Resource Strain: Not on file  Food Insecurity: Not on file  Transportation Needs: Not on file  Physical Activity: Not on file  Stress: Not on file  Social Connections: Not on file     Review of Systems: A 12 point ROS discussed and pertinent positives are indicated in the HPI above.  All other systems are negative.  Review of Systems  Constitutional:  Negative for chills and fever.  HENT:  Negative for nosebleeds.   Eyes:  Negative for visual disturbance.  Respiratory:  Negative for cough and shortness of breath.   Cardiovascular:  Negative for chest pain and leg swelling.  Gastrointestinal:  Positive for abdominal pain. Negative for blood in stool, nausea and vomiting.  Genitourinary:  Negative for hematuria.  Neurological:  Negative for dizziness, light-headedness and headaches.   Vital Signs: BP 139/90   Pulse 80   Temp 97.6 F (36.4 C) (Oral)   Resp 16   SpO2 100%   Physical Exam Constitutional:      Appearance: Normal appearance. She is not ill-appearing.  HENT:     Head: Normocephalic and atraumatic.     Mouth/Throat:     Mouth: Mucous membranes are dry.     Pharynx: Oropharynx is clear.  Cardiovascular:     Rate and Rhythm: Normal rate and regular rhythm.     Pulses: Normal pulses.     Heart sounds: Normal heart  sounds. No murmur heard.   No friction rub. No gallop.  Pulmonary:     Effort: Pulmonary effort is normal. No respiratory distress.     Breath sounds: Normal breath sounds. No stridor. No wheezing, rhonchi or rales.  Abdominal:     General: Bowel sounds are normal. There is no distension.     Palpations: Abdomen is soft.     Tenderness: There is no abdominal tenderness. There is no guarding.  Musculoskeletal:     Right lower leg: No edema.     Left lower leg: No edema.  Skin:    General: Skin is warm and dry.  Neurological:     Mental Status: She is alert and oriented to person, place, and time.  Psychiatric:  Mood and Affect: Mood normal.        Behavior: Behavior normal.        Thought Content: Thought content normal.        Judgment: Judgment normal.    Imaging: No results found.  Labs:  CBC: Recent Labs    11/16/20 1525 03/14/21 0936 07/07/21 1115  WBC 3.2* 2.1* 3.3*  HGB 12.3 12.2 13.1  HCT 38.0 38.6 39.2  PLT 205 200 204    COAGS: No results for input(s): INR, APTT in the last 8760 hours.  BMP: Recent Labs    11/16/20 1525 03/14/21 0936 07/07/21 1115  NA 141 143 142  K 3.9 4.4 3.7  CL 105 106 107  CO2 28 27 25   GLUCOSE 103* 113* 119*  BUN 15 14 13   CALCIUM 10.2 10.0 10.1  CREATININE 0.74 0.91 0.80  GFRNONAA >60 >60 >60    LIVER FUNCTION TESTS: Recent Labs    11/16/20 1525 03/14/21 0936 07/07/21 1115  BILITOT 0.3 0.3 0.4  AST 19 21 23   ALT 15 17 21   ALKPHOS 99 93 100  PROT 7.2 7.0 7.2  ALBUMIN 4.1 4.2 4.3    TUMOR MARKERS: No results for input(s): AFPTM, CEA, CA199, CHROMGRNA in the last 8760 hours.  Assessment and Plan: History of anemia, B-cell lymphoma, Hep B, HIV and immune deficiency disorder. Dr. Irene Limbo has referred pt to IR for port a catheter removal d/t chemotherapy being completed.   Pt resting quietly on stretcher. She is A&O, calm and pleasant. She is in no distress.  She is NPO per order. No labs needed today.   VSS  Risks and benefits of port-a-catheter removal was discussed with the patient including, but not limited to bleeding, infection, pneumothorax, or fibrin sheath development and need for additional procedures.  All of the patient's questions were answered, patient is agreeable to proceed. Consent signed and in chart.   Thank you for this interesting consult.  I greatly enjoyed meeting Heather [name] and look forward to participating in their care.  A copy of this report was sent to the requesting provider on this date.  Electronically Signed: Tyson Alias, NP 10/02/2021, 2:37 PM   I spent a total of 30 minutes in face to face in clinical consultation, greater than 50% of which was counseling/coordinating care for port a catheter removal.

## 2021-10-02 ENCOUNTER — Ambulatory Visit (HOSPITAL_COMMUNITY)
Admission: RE | Admit: 2021-10-02 | Discharge: 2021-10-02 | Disposition: A | Discharge status: Home / Self Care | Payer: 59 | Source: Ambulatory Visit | Attending: Hematology | Admitting: Hematology

## 2021-10-02 ENCOUNTER — Other Ambulatory Visit: Payer: Self-pay

## 2021-10-02 ENCOUNTER — Encounter (HOSPITAL_COMMUNITY): Payer: Self-pay

## 2021-10-02 DIAGNOSIS — D649 Anemia, unspecified: Secondary | ICD-10-CM | POA: Diagnosis not present

## 2021-10-02 DIAGNOSIS — Z452 Encounter for adjustment and management of vascular access device: Secondary | ICD-10-CM | POA: Diagnosis not present

## 2021-10-02 DIAGNOSIS — B20 Human immunodeficiency virus [HIV] disease: Secondary | ICD-10-CM | POA: Insufficient documentation

## 2021-10-02 DIAGNOSIS — B1910 Unspecified viral hepatitis B without hepatic coma: Secondary | ICD-10-CM | POA: Insufficient documentation

## 2021-10-02 DIAGNOSIS — C8510 Unspecified B-cell lymphoma, unspecified site: Secondary | ICD-10-CM | POA: Diagnosis not present

## 2021-10-02 DIAGNOSIS — Z95828 Presence of other vascular implants and grafts: Secondary | ICD-10-CM

## 2021-10-02 HISTORY — PX: IR REMOVAL TUN ACCESS W/ PORT W/O FL MOD SED: IMG2290

## 2021-10-02 MED ORDER — SODIUM CHLORIDE 0.9 % IV SOLN: INTRAVENOUS | Status: DC

## 2021-10-02 MED ORDER — FENTANYL CITRATE (PF) 100 MCG/2ML IJ SOLN
INTRAMUSCULAR | Status: AC
  Filled 2021-10-02: qty 2

## 2021-10-02 MED ORDER — FENTANYL CITRATE (PF) 100 MCG/2ML IJ SOLN
INTRAMUSCULAR | Status: AC | PRN
  Administered 2021-10-02: 50 ug via INTRAVENOUS

## 2021-10-02 MED ORDER — MIDAZOLAM HCL 2 MG/2ML IJ SOLN
INTRAMUSCULAR | Status: AC | PRN
  Administered 2021-10-02: 2 mg via INTRAVENOUS

## 2021-10-02 MED ORDER — LIDOCAINE-EPINEPHRINE (PF) 2 %-1:200000 IJ SOLN
INTRAMUSCULAR | Status: AC | PRN
  Administered 2021-10-02: 10 mL via INTRADERMAL

## 2021-10-02 MED ORDER — MIDAZOLAM HCL 2 MG/2ML IJ SOLN
INTRAMUSCULAR | Status: AC
  Filled 2021-10-02: qty 4

## 2021-10-02 MED ORDER — LIDOCAINE-EPINEPHRINE (PF) 2 %-1:200000 IJ SOLN
INTRAMUSCULAR | Status: AC
  Filled 2021-10-02: qty 20

## 2021-10-02 MED ORDER — LIDOCAINE-EPINEPHRINE 1 %-1:100000 IJ SOLN
INTRAMUSCULAR | Status: AC | PRN
  Administered 2021-10-02: 20 mL via INTRADERMAL

## 2021-10-02 NOTE — Procedures (Signed)
Interventional Radiology Procedure Note  Procedure: Port removal  Findings: Please refer to procedural dictation for full description.  Complications: None   Estimated Blood Loss: < 5 mL  Recommendations: Follow up with IR as needed.   Ruthann Cancer, MD Pager: [phone]

## 2021-10-06 ENCOUNTER — Other Ambulatory Visit: Payer: 59

## 2021-10-06 ENCOUNTER — Ambulatory Visit: Payer: 59 | Admitting: Hematology

## 2021-10-10 ENCOUNTER — Other Ambulatory Visit: Payer: Self-pay

## 2021-10-10 DIAGNOSIS — C8338 Diffuse large B-cell lymphoma, lymph nodes of multiple sites: Secondary | ICD-10-CM

## 2021-10-11 ENCOUNTER — Inpatient Hospital Stay: Payer: 59

## 2021-10-11 ENCOUNTER — Other Ambulatory Visit: Payer: 59

## 2021-10-11 ENCOUNTER — Inpatient Hospital Stay: Payer: 59 | Attending: Hematology | Admitting: Hematology

## 2021-10-11 ENCOUNTER — Other Ambulatory Visit: Payer: Self-pay

## 2021-10-11 VITALS — BP 135/91 | HR 87 | Temp 97.3°F | Resp 20 | Wt 170.5 lb

## 2021-10-11 DIAGNOSIS — C8510 Unspecified B-cell lymphoma, unspecified site: Secondary | ICD-10-CM | POA: Diagnosis not present

## 2021-10-11 DIAGNOSIS — B20 Human immunodeficiency virus [HIV] disease: Secondary | ICD-10-CM | POA: Diagnosis not present

## 2021-10-11 DIAGNOSIS — C8338 Diffuse large B-cell lymphoma, lymph nodes of multiple sites: Secondary | ICD-10-CM

## 2021-10-11 DIAGNOSIS — Z298 Encounter for other specified prophylactic measures: Secondary | ICD-10-CM | POA: Diagnosis not present

## 2021-10-11 DIAGNOSIS — Z9225 Personal history of immunosupression therapy: Secondary | ICD-10-CM | POA: Diagnosis not present

## 2021-10-11 LAB — CBC WITH DIFFERENTIAL (CANCER CENTER ONLY)
Abs Immature Granulocytes: 0.01 10*3/uL (ref 0.00–0.07)
Basophils Absolute: 0 10*3/uL (ref 0.0–0.1)
Basophils Relative: 1 %
Eosinophils Absolute: 0.1 10*3/uL (ref 0.0–0.5)
Eosinophils Relative: 4 %
HCT: 39 % (ref 36.0–46.0)
Hemoglobin: 12.6 g/dL (ref 12.0–15.0)
Immature Granulocytes: 0 %
Lymphocytes Relative: 39 %
Lymphs Abs: 1.4 10*3/uL (ref 0.7–4.0)
MCH: 26.5 pg (ref 26.0–34.0)
MCHC: 32.3 g/dL (ref 30.0–36.0)
MCV: 82.1 fL (ref 80.0–100.0)
Monocytes Absolute: 0.3 10*3/uL (ref 0.1–1.0)
Monocytes Relative: 8 %
Neutro Abs: 1.7 10*3/uL (ref 1.7–7.7)
Neutrophils Relative %: 48 %
Platelet Count: 209 10*3/uL (ref 150–400)
RBC: 4.75 MIL/uL (ref 3.87–5.11)
RDW: 15.6 % — ABNORMAL HIGH (ref 11.5–15.5)
WBC Count: 3.6 10*3/uL — ABNORMAL LOW (ref 4.0–10.5)
nRBC: 0 % (ref 0.0–0.2)

## 2021-10-11 LAB — CMP (CANCER CENTER ONLY)
ALT: 22 U/L (ref 0–44)
AST: 20 U/L (ref 15–41)
Albumin: 4.3 g/dL (ref 3.5–5.0)
Alkaline Phosphatase: 96 U/L (ref 38–126)
Anion gap: 11 (ref 5–15)
BUN: 11 mg/dL (ref 6–20)
CO2: 26 mmol/L (ref 22–32)
Calcium: 9.6 mg/dL (ref 8.9–10.3)
Chloride: 105 mmol/L (ref 98–111)
Creatinine: 0.84 mg/dL (ref 0.44–1.00)
GFR, Estimated: >60 mL/min (ref >60)
Glucose, Bld: 107 mg/dL — ABNORMAL HIGH (ref 70–99)
Potassium: 3.9 mmol/L (ref 3.5–5.1)
Sodium: 142 mmol/L (ref 135–145)
Total Bilirubin: 0.4 mg/dL (ref 0.3–1.2)
Total Protein: 7.4 g/dL (ref 6.5–8.1)

## 2021-10-11 LAB — LACTATE DEHYDROGENASE: LDH: 231 U/L — ABNORMAL HIGH (ref 98–192)

## 2021-10-12 ENCOUNTER — Telehealth: Payer: Self-pay | Admitting: Hematology

## 2021-10-12 NOTE — Telephone Encounter (Signed)
Left message with follow-up appointment per 12/7 los. Sent add-on message.

## 2021-10-13 ENCOUNTER — Telehealth: Payer: Self-pay | Admitting: Hematology

## 2021-10-13 NOTE — Telephone Encounter (Signed)
Left message with follow-up appointment per 12/7 los. 

## 2021-10-14 ENCOUNTER — Inpatient Hospital Stay: Payer: 59

## 2021-10-17 ENCOUNTER — Encounter: Payer: Self-pay | Admitting: Hematology

## 2021-10-17 ENCOUNTER — Inpatient Hospital Stay: Payer: 59

## 2021-10-17 ENCOUNTER — Other Ambulatory Visit: Payer: Self-pay

## 2021-10-17 DIAGNOSIS — C8338 Diffuse large B-cell lymphoma, lymph nodes of multiple sites: Secondary | ICD-10-CM

## 2021-10-17 DIAGNOSIS — B20 Human immunodeficiency virus [HIV] disease: Secondary | ICD-10-CM

## 2021-10-17 DIAGNOSIS — C8510 Unspecified B-cell lymphoma, unspecified site: Secondary | ICD-10-CM | POA: Diagnosis not present

## 2021-10-17 MED ORDER — CILGAVIMAB (PART OF EVUSHELD) INJECTION
300.0000 mg | Freq: Once | INTRAMUSCULAR | Status: AC
  Administered 2021-10-17: 300 mg via INTRAMUSCULAR
  Filled 2021-10-17: qty 3

## 2021-10-17 MED ORDER — TIXAGEVIMAB (PART OF EVUSHELD) INJECTION
300.0000 mg | Freq: Once | INTRAMUSCULAR | Status: AC
  Administered 2021-10-17: 300 mg via INTRAMUSCULAR
  Filled 2021-10-17: qty 3

## 2021-10-17 NOTE — Patient Instructions (Signed)
Tixagevimab; Cilgavimab Solutions for Injection °What is this medication? °TIXAGEVIMAB; CILGAVIMAB (tix a jev i mab; sil gav i mab) reduces the risk of getting COVID-19 in persons with immune system problems who may not respond properly to the COVID-19 vaccine or persons who can't receive the COVID-19 vaccine. It belongs to a group of medications called monoclonal antibodies. It may decrease the risk of developing severe symptoms of COVID-19. It may also decrease the chance of going to the hospital. This medication is not approved by the FDA. The FDA has authorized emergency use of this medication during the COVID-19 pandemic. °This medicine may be used for other purposes; ask your health care provider or pharmacist if you have questions. °COMMON BRAND NAME(S): EVUSHELD °What should I tell my care team before I take this medication? °They need to know if you have any of these conditions: °any allergies °any serious illness °bleeding disorder °have received a COVID-19 vaccine °heart disease °an unusual or allergic reaction to tixagevimab, cilgavimab, other medications, foods, dyes, or preservatives °pregnant or trying to get pregnant °breast-feeding °How should I use this medication? °This medication is injected into a muscle. It is given by your care team in a hospital or clinic setting. °Talk to your care team about the use of this medication in children. While it may be given to children as young as 12 years, precautions do apply. °Overdosage: If you think you have taken too much of this medicine contact a poison control center or emergency room at once. °NOTE: This medicine is only for you. Do not share this medicine with others. °What if I miss a dose? °Keep appointments for follow-up doses. It is important not to miss your dose. Call your care team if you are unable to keep an appointment. °What may interact with this medication? °COVID-19 vaccines °This list may not describe all possible interactions. Give  your health care provider a list of all the medicines, herbs, non-prescription drugs, or dietary supplements you use. Also tell them if you smoke, drink alcohol, or use illegal drugs. Some items may interact with your medicine. °What should I watch for while using this medication? °Your condition will be monitored carefully while you are receiving this medication. Visit your care team for regular checks on your progress. Tell your care team if your symptoms do not start to get better or if they get worse. °After receiving this medication, wait at least 14 days before getting the COVID-19 vaccine. °What side effects may I notice from receiving this medication? °Side effects that you should report to your care team as soon as possible: °Allergic reactions--skin rash, itching, hives, swelling of the face, lips, tongue, or throat °Heart attack--pain or tightness in the chest, shoulders, arms, or jaw, nausea, shortness of breath, cold or clammy skin, feeling faint or lightheaded °Heart failure--shortness of breath, swelling of the ankles, feet, or hands, sudden weight gain, unusual weakness or fatigue °Heart rhythm changes--fast or irregular heartbeat, dizziness, feeling faint or lightheaded, chest pain, trouble breathing °Side effects that usually do not require medical attention (report these to your care team if they continue or are bothersome): °Cough °Fatigue °Headache °Pain, redness, or irritation at site where injected °This list may not describe all possible side effects. Call your doctor for medical advice about side effects. You may report side effects to FDA at 1-800-FDA-1088. °Where should I keep my medication? °This medication is given in a hospital or clinic. It will not be stored at home. °NOTE: This sheet is   a summary. It may not cover all possible information. If you have questions about this medicine, talk to your doctor, pharmacist, or health care provider. °© 2022 Elsevier/Gold Standard (2020-10-13  00:00:00) ° °

## 2021-10-17 NOTE — Progress Notes (Signed)
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  HISTORY OF PRESENTING ILLNESS:  Please see previous notes for details on initial presentation.  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 She notes that she has been feeling well and has had no acute new concerns since her last clinic visit with [place] on 07/07/2021.  No fevers no chills no night sweats.  No unexpected weight loss.  No new lumps or bumps. HIV and hepatitis B treatments have been stable and she continues to follow with infectious disease.  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  Labs today 10/11/2021 show WBC count of 3.6k, hemoglobin of 12.6 and platelets of 209k CMP within normal limits LDH 231  MEDICAL HISTORY:  Past Medical History:  Diagnosis Date   Anemia    Diffuse large B-cell lymphoma of lymph nodes of multiple regions ([place]) 04/12/2020   Hep B w/o coma, chronic, w/o delta ([facility])    History of blood transfusion    childhood   HIV (human immunodeficiency virus infection) ([place])    Immune deficiency disorder ([place])     SURGICAL HISTORY: Past Surgical History:  Procedure Laterality Date   CHROMOPERTUBATION Bilateral 11/29/2016   Procedure: CHROMOPERTUBATION;  Surgeon: Waymon Amato, MD;  Location: [facility];  Service: Gynecology;  Laterality: Bilateral;  fallopian tubes   ENDOMETRIAL BIOPSY     IR IMAGING GUIDED PORT INSERTION  04/15/2020   IR REMOVAL TUN ACCESS W/ PORT W/O FL MOD SED  10/02/2021   IR THORACENTESIS ASP PLEURAL SPACE W/IMG GUIDE  04/15/2020   MYOMECTOMY N/A 11/29/2016   Procedure: MYOMECTOMY;  Surgeon: Waymon Amato, MD;   Location: [facility];  Service: Gynecology;  Laterality: N/A;    SOCIAL HISTORY: Social History   Socioeconomic History   Marital status: Married    Spouse name: Not on file   Number of children: Not on file   Years of education: Not on file   Highest education level: Not on file  Occupational History   Not on file  Tobacco Use   Smoking status: Never   Smokeless tobacco: Never  Substance and Sexual Activity   Alcohol use: Yes    Comment: occ   Drug use: No   Sexual activity: Yes  Other Topics Concern   Not on file  Social History Narrative   Not on file   Social Determinants of Health   Financial Resource Strain: Not on file  Food Insecurity: Not on file  Transportation Needs: Not on file  Physical Activity: Not on file  Stress: Not on file  Social Connections: Not on file  Intimate Partner Violence: Not on file    FAMILY HISTORY: Family History  Problem Relation Age of Onset   Diabetes Mother    Hypertension Mother     ALLERGIES:  is allergic to benadryl allergy [diphenhydramine hcl].  MEDICATIONS:  Current Outpatient Medications  Medication Sig Dispense Refill   acetaminophen (TYLENOL) 500 MG tablet Take 500 mg by mouth every 6 (six) hours as needed for mild pain or headache.     acyclovir (ZOVIRAX) 400 MG tablet TAKE 1 TABLET BY MOUTH TWICE A DAY 180 tablet  2   B Complex Vitamins (B COMPLEX PO) Take 1 tablet by mouth daily.     bictegravir-emtricitabine-tenofovir AF (BIKTARVY) 50-200-25 MG TABS tablet Take 1 tablet by mouth daily with breakfast.      cholecalciferol (VITAMIN D3) 25 MCG (1000 UNIT) tablet Take 1 tablet (1,000 Units total) by mouth at bedtime.     feeding supplement, ENSURE ENLIVE, (ENSURE ENLIVE) LIQD Take 237 mLs by mouth 2 (two) times daily between meals. 237 mL 12   HYDROcodone-acetaminophen (NORCO) 5-325 MG tablet Take 1 tablet by mouth every 6 (six) hours as needed for moderate pain. 30 tablet 0   ibuprofen (ADVIL) 200 MG tablet Take 2  tablets (400 mg total) by mouth every 6 (six) hours as needed for mild pain. (Patient not taking: Reported on 06/20/2020) 30 tablet 0   lidocaine-prilocaine (EMLA) cream Apply to affected area once (Patient taking differently: Apply 1 application topically as needed (port access). Apply to affected area once) 30 g 3   LORazepam (ATIVAN) 0.5 MG tablet Take 1 tablet (0.5 mg total) by mouth every 6 (six) hours as needed (for chemo induced nausea or vomiting). 30 tablet 0   Multiple Vitamin (MULTIVITAMIN WITH MINERALS) TABS tablet Take 1 tablet by mouth at bedtime.      ondansetron (ZOFRAN) 8 MG tablet Take 1 tablet (8 mg total) by mouth every 8 (eight) hours as needed for nausea or vomiting. 30 tablet 1   polyethylene glycol (MIRALAX / GLYCOLAX) 17 g packet Take 17 g by mouth daily as needed for mild constipation. 14 each 0   prochlorperazine (COMPAZINE) 10 MG tablet Take 1 tablet (10 mg total) by mouth every 6 (six) hours as needed for nausea or vomiting. 30 tablet 1   senna-docusate (SENOKOT-S) 8.6-50 MG tablet Take 2 tablets by mouth at bedtime as needed for mild constipation.     Sodium Chloride-Sodium Bicarb (SODIUM BICARBONATE/SODIUM CHLORIDE) SOLN 1 application by Mouth Rinse route 4 (four) times daily.     No current facility-administered medications for this visit.    REVIEW OF SYSTEMS:   .10 Point review of Systems was done is negative except as noted above.   PHYSICAL EXAMINATION: ECOG PERFORMANCE STATUS: 1 - Symptomatic but completely ambulatory  . Vitals:   10/11/21 1043  BP: (!) 135/91  Pulse: 87  Resp: 20  Temp: (!) 97.3 F (36.3 C)  SpO2: 98%   Filed Weights   10/11/21 1043  Weight: 170 lb 8 oz (77.3 kg)   .Body mass index is 33.3 kg/m.  Marland Kitchen GENERAL:alert, in no acute distress and comfortable SKIN: no acute rashes, no significant lesions EYES: conjunctiva are pink and non-injected, sclera anicteric OROPHARYNX: MMM, no exudates, no oropharyngeal erythema or  ulceration NECK: supple, no JVD LYMPH:  no palpable lymphadenopathy in the cervical, axillary or inguinal regions LUNGS: clear to auscultation b/l with normal respiratory effort HEART: regular rate & rhythm ABDOMEN:  normoactive bowel sounds , non tender, not distended. Extremity: no pedal edema PSYCH: alert & oriented x 3 with fluent speech NEURO: no focal motor/sensory deficits  LABORATORY DATA:  I have reviewed the data as listed  . CBC Latest Ref Rng & Units 10/11/2021 07/07/2021 03/14/2021  WBC 4.0 - 10.5 K/uL 3.6(L) 3.3(L) 2.1(L)  Hemoglobin 12.0 - 15.0 g/dL 12.6 13.1 12.2  Hematocrit 36.0 - 46.0 % 39.0 39.2 38.6  Platelets 150 - 400 K/uL 209 204 200    . CMP Latest Ref Rng & Units 10/11/2021 07/07/2021 03/14/2021  Glucose 70 -  99 mg/dL 107(H) 119(H) 113(H)  BUN 6 - 20 mg/dL 11 13 14   Creatinine 0.44 - 1.00 mg/dL 0.84 0.80 0.91  Sodium 135 - 145 mmol/L 142 142 143  Potassium 3.5 - 5.1 mmol/L 3.9 3.7 4.4  Chloride 98 - 111 mmol/L 105 107 106  CO2 22 - 32 mmol/L 26 25 27   Calcium 8.9 - 10.3 mg/dL 9.6 10.1 10.0  Total Protein 6.5 - 8.1 g/dL 7.4 7.2 7.0  Total Bilirubin 0.3 - 1.2 mg/dL 0.4 0.4 0.3  Alkaline Phos 38 - 126 U/L 96 100 93  AST 15 - 41 U/L 20 23 21   ALT 0 - 44 U/L 22 21 17    . Lab Results  Component Value Date   LDH 231 (H) 10/11/2021     04/08/2020 Pancreatic Mass Surgical Pathology Report [id number]):    RADIOGRAPHIC STUDIES: I have personally reviewed the radiological images as listed and agreed with the findings in the report. IR REMOVAL TUN ACCESS W/ PORT W/O FL MOD SED  Result Date: 10/02/2021 CLINICAL DATA:  45 year old female with history of diffuse large B-cell lymphoma presenting after completion of therapy for port removal. EXAM: REMOVAL OF IMPLANTED TUNNELED PORT-A-CATH MEDICATIONS: None. ANESTHESIA/SEDATION: Moderate (conscious) sedation was employed during this procedure. A total of Versed 2 mg and Fentanyl 100 mcg was administered  intravenously. Moderate Sedation Time: 15 minutes. The patient's level of consciousness and vital signs were monitored continuously by radiology nursing throughout the procedure under my direct supervision. FLUOROSCOPY TIME:  None PROCEDURE: Informed written consent was obtained from the patient after a discussion of the risk, benefits and alternatives to the procedure. The patient was positioned supine on the fluoroscopy table and the right chest Port-A-Cath site was prepped with chlorhexidine. A sterile gown and gloves were worn during the procedure. Local anesthesia was provided with 1% lidocaine with epinephrine. A timeout was performed prior to the initiation of the procedure. An incision was made overlying the Port-A-Cath with a #15 scalpel, excising the established keloid scar. Utilizing sharp and blunt dissection, the Port-A-Cath was removed completely. The pocked was irrigated with sterile saline. Wound closure was performed with subcutaneous 3-0 Vicryl, running subcuticular 4-0 Monocryl, and Dermabond. A dressing was placed. The patient tolerated the procedure well without immediate post procedural complication. FINDINGS: Successful removal of implant Port-A-Cath without immediate post procedural complication. IMPRESSION: Successful removal of implanted Port-A-Cath. Ruthann Cancer, MD [facility] Rad[facility] Electronically Signed   By: Ruthann Cancer M.D.   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 No internal necrosis within the mass noted. Diffuse splenomegaly. Left periaortic lymph node causing mild  displacement of the third portion of the duodenum.   HIV could be risk factors for high grade EBV driven lymphomas   #2 history of HIV/AIDS last VL undetectable follows with Dr. Melynda Keller and at [facility].   #3 h/o Hepatitis B - last VL undetectable  PLAN: -Discussed patient's labs done today which show normal CBC, stable CMP and stable LDH levels of 231. -She has no lab or clinical evidence of large B-cell lymphoma recurrence at this time. -She notes her HIV and hepatitis B management  have been coming along well and she has undetectable viral loads for both these conditions. -She has had her Port-A-Cath removed since her last clinic visit.  -Discussed consideration of taking Evusheld --patient is agreeable and this has been ordered.  FOLLOW  UP: Evusheld in 3-5 days RTC with Dr Irene Limbo with labs in 4 months   All of the patient's questions were answered with apparent satisfaction. The patient knows to call the clinic with any problems, questions or concerns.   Sullivan Lone MD [place] [facility]IV[facility] Hematology/Oncology Physician [facility]  (Office):       [phone] (Work cell):  [phone] (Fax):           [phone]

## 2021-11-19 IMAGING — CT CT ABD-PELV W/ CM
2 of 5 series · 15 of 46 positions shown, 17 images · IV contrast (Omnipaque)
Comparison: None.

CLINICAL DATA: Periodically epigastric pain

EXAM:
CT ABDOMEN AND PELVIS WITH CONTRAST
TECHNIQUE: Multidetector CT imaging of the abdomen and pelvis was performed
using the standard protocol following bolus administration of
intravenous contrast.
CONTRAST:  100mL OMNIPAQUE IOHEXOL 300 MG/ML  SOLN

[Series 2: axial st · axial · 0.66mm/px · z∈[-423,-8]mm · 12 of 95 slices shown, 14 images]
[im 6/95  soft-tissue]
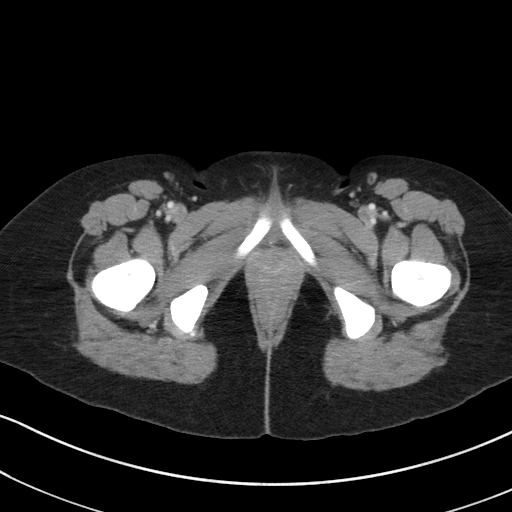
[im 6/95  bone]
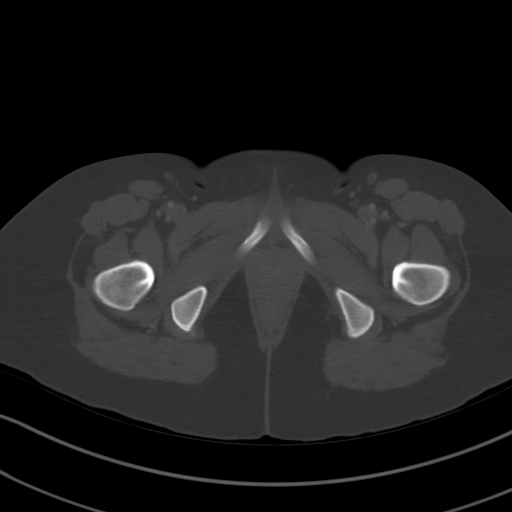
[im 17/95  soft-tissue]
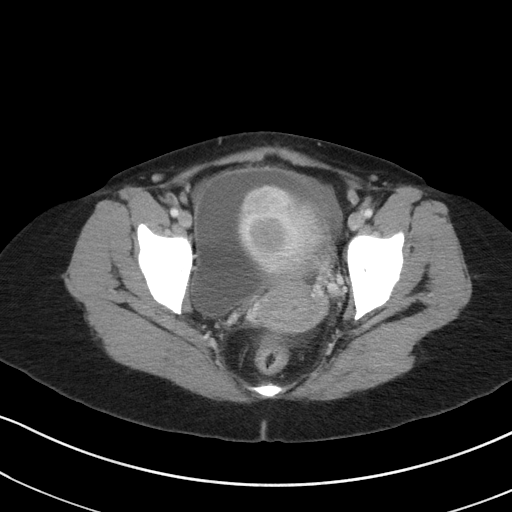
[im 23/95  soft-tissue]
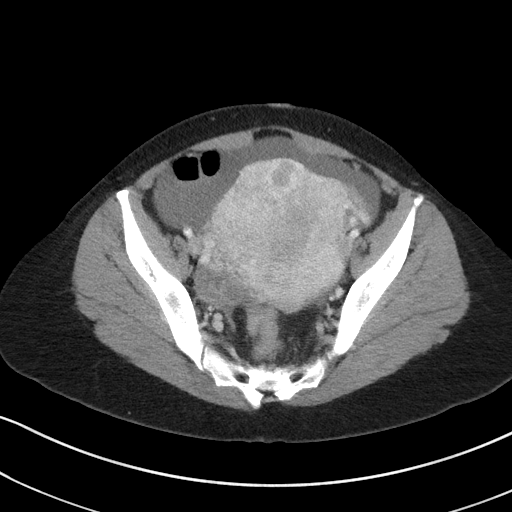
[im 28/95  soft-tissue]
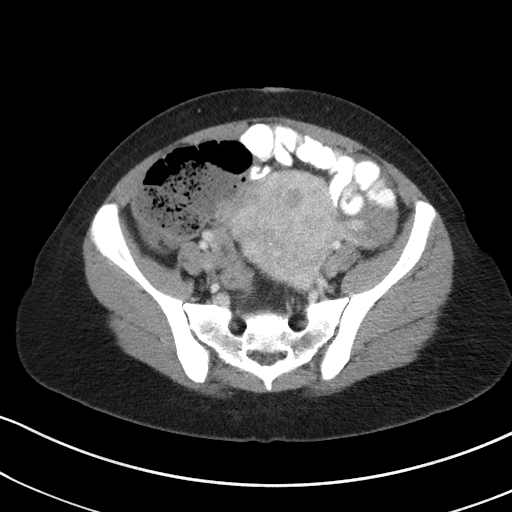
[im 39/95  soft-tissue]
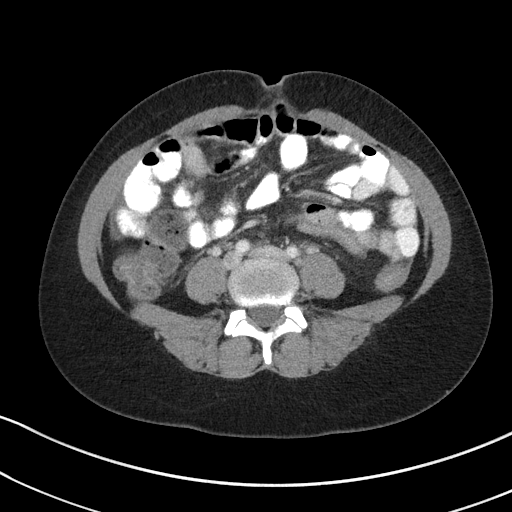
[im 45/95  soft-tissue]
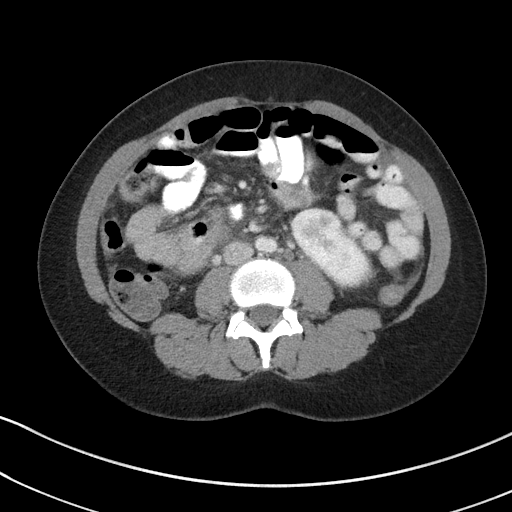
[im 50/95  soft-tissue]
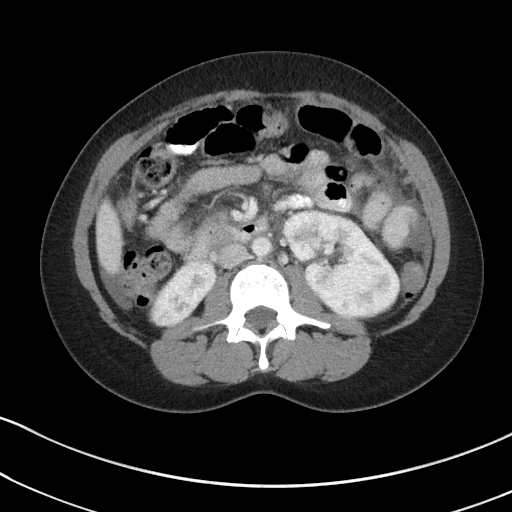
[im 61/95  soft-tissue]
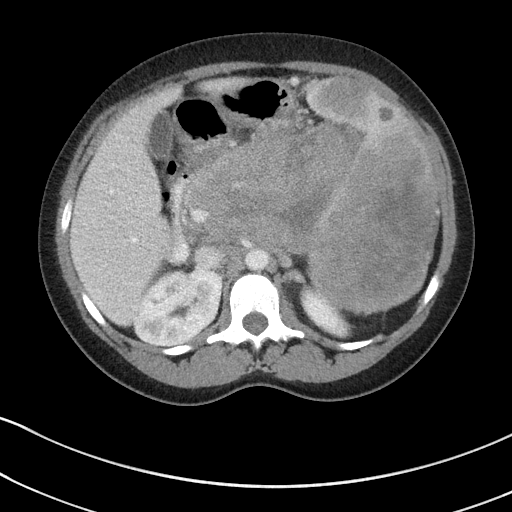
[im 67/95  soft-tissue]
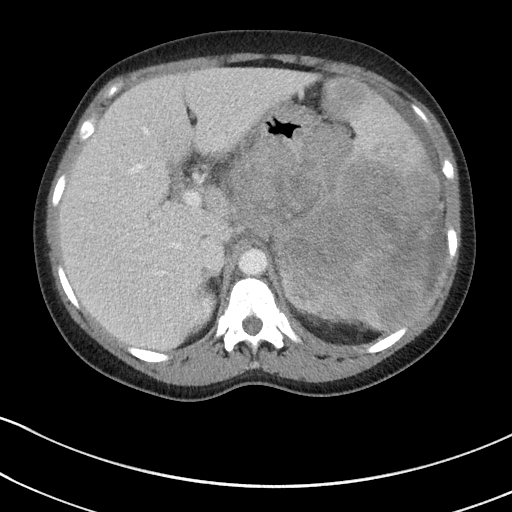
[im 67/95  bone]
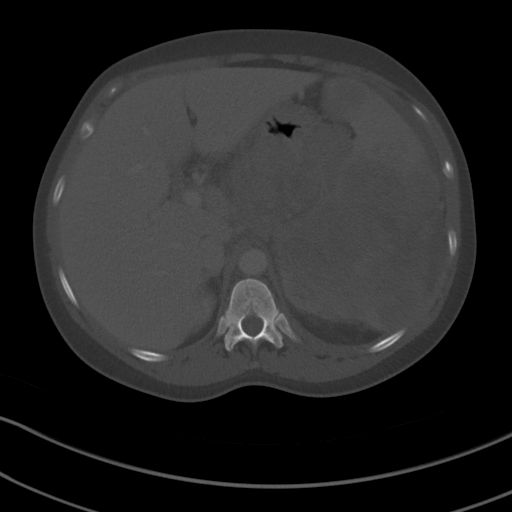
[im 72/95  soft-tissue]
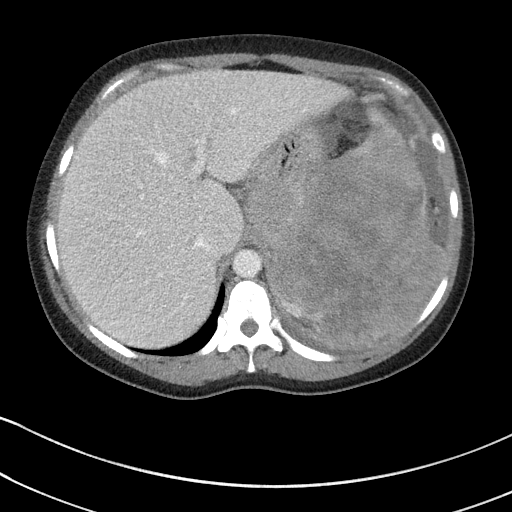
[im 83/95  soft-tissue]
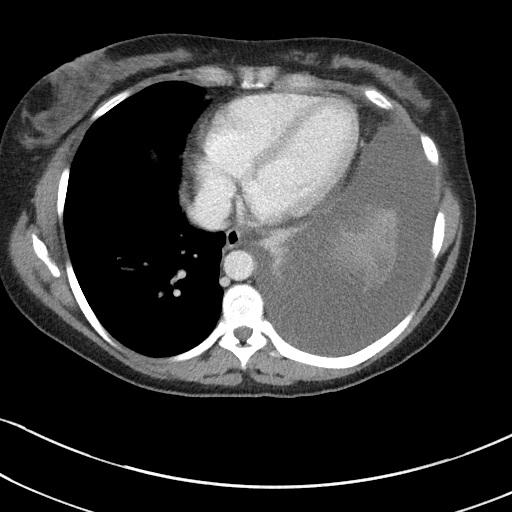
[im 89/95  soft-tissue]
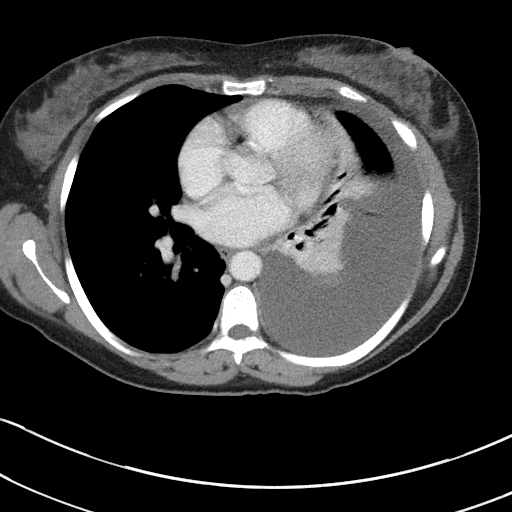

[Series 5: coronal st · coronal · 0.70mm/px · 3 of 79 slices shown]
[im 27/79  soft-tissue]
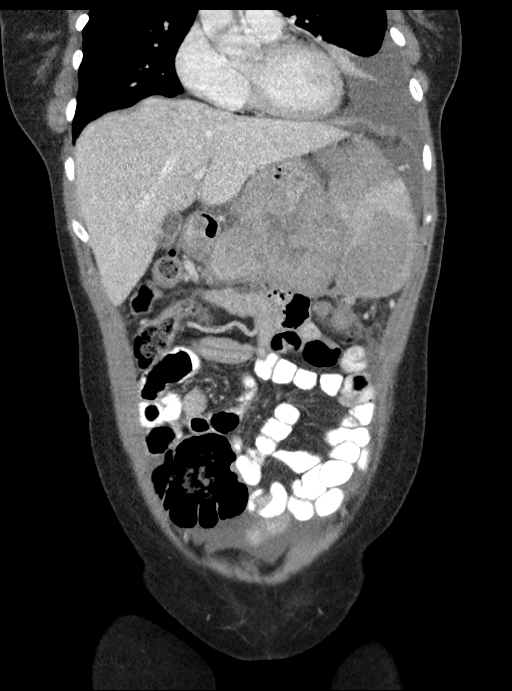
[im 35/79  soft-tissue]
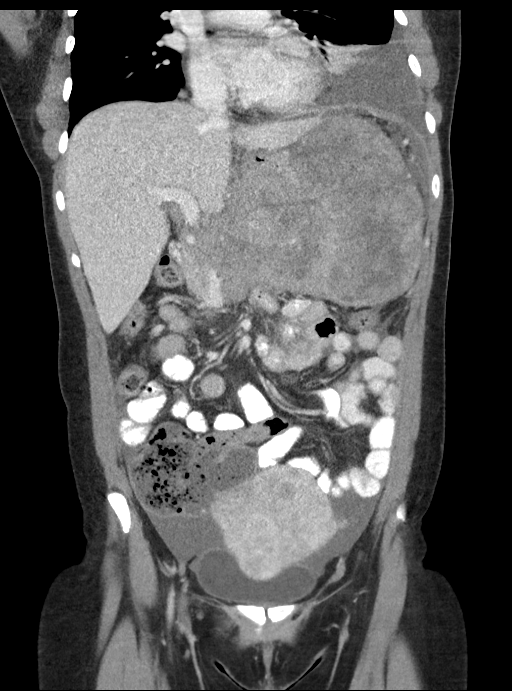
[im 44/79  soft-tissue]
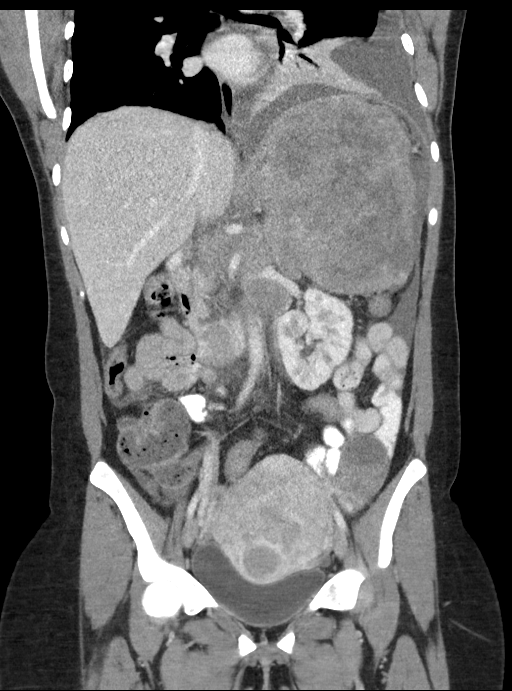

[15 of 46 positions shown; findings below may reference images not displayed]

FINDINGS: Lower chest: The visualized heart size within normal limits. No
pericardial fluid/thickening.

No hiatal hernia.

There is a small to moderate left pleural effusion with basilar
atelectasis.

Hepatobiliary: The liver is normal in density without focal
abnormality.The main portal vein is patent. No evidence of calcified
gallstones, gallbladder wall thickening or biliary dilatation.

Pancreas: There is a extensive heterogeneously enhancing mass
involving nearly the entirety of the pancreatic body and tail. Only
a small portion of the pancreatic head appears to be spared. The SMV
and SMA appear to be patent. No areas of internal necrosis seen
within the mass. The mass appears to extend into the splenic hilum.

Spleen: Extensive large multiple heterogeneously enhancing hypodense
masses seen throughout the splenic parenchyma likely from the
extension of the pancreatic mass which extends through the splenic
hilum. There is diffuse splenomegaly. The splenic vein appears to be
narrowed wall entering the splenic hilum.

Adrenals/Urinary Tract: Both adrenal glands appear normal. The
kidneys and collecting system appear normal without evidence of
urinary tract calculus or hydronephrosis. Bladder is unremarkable.

Stomach/Bowel: There appears to be diffuse wall thickening seen
within the proximal stomach with with the heterogeneous mass seen
encompassing the proximal portion. The remainder of the small-bowel
and colon are unremarkable. There is a moderate amount of colonic
stool present.

Vascular/Lymphatic: There is a left periaortic nodule/lymph node
causing mild displacement of the third portion of the duodenum
measuring 2.8 x 2.7 cm. Scattered small left-sided periaortic lymph
nodes are also noted.

Reproductive: Extensively enlarged uterus with multiple hypodense
fibroids are seen throughout as on a prior MRI dating back to 4182.
Small amount of fluid within the endometrial canal.

Other: Small amount of abdominopelvic ascites is seen.

Musculoskeletal: No acute or significant osseous findings.
IMPRESSION: Findings consistent with an extensively enlarged heterogeneous mass
involving nearly the entirety of the pancreas, likely consistent
with primary pancreatic neoplasm or lymphoma

There is extension into the splenic hilum with diffuse splenic
metastases.

Retroperitoneal adenopathy, consistent with metastatic disease

Diffuse wall thickening seen within the proximal stomach which could
either be related to gastritis or possible metastatic disease.

Small amount of abdominopelvic ascites

Enlarged uterus with multiple uterine fibroids

These results were called by telephone at the time of interpretation
on 04/06/2020 at [DATE] to provider SIMOHAMDE DIESEL , who verbally
acknowledged these results.

## 2021-12-14 ENCOUNTER — Ambulatory Visit: Payer: 59 | Admitting: Plastic Surgery

## 2022-02-06 ENCOUNTER — Other Ambulatory Visit: Payer: Self-pay

## 2022-02-06 DIAGNOSIS — C8338 Diffuse large B-cell lymphoma, lymph nodes of multiple sites: Secondary | ICD-10-CM

## 2022-02-07 ENCOUNTER — Inpatient Hospital Stay: Payer: 59 | Admitting: Hematology

## 2022-02-07 ENCOUNTER — Inpatient Hospital Stay: Payer: 59

## 2022-02-21 ENCOUNTER — Inpatient Hospital Stay: Payer: 59

## 2022-02-21 ENCOUNTER — Inpatient Hospital Stay: Payer: 59 | Admitting: Hematology

## 2022-03-06 ENCOUNTER — Inpatient Hospital Stay: Payer: BC Managed Care – PPO | Admitting: Hematology

## 2022-03-06 ENCOUNTER — Other Ambulatory Visit: Payer: Self-pay

## 2022-03-06 ENCOUNTER — Encounter: Payer: Self-pay | Admitting: Hematology

## 2022-03-06 ENCOUNTER — Inpatient Hospital Stay: Payer: BC Managed Care – PPO | Attending: Hematology

## 2022-03-06 VITALS — BP 140/83 | HR 98 | Temp 97.5°F | Resp 20 | Wt 174.1 lb

## 2022-03-06 DIAGNOSIS — B20 Human immunodeficiency virus [HIV] disease: Secondary | ICD-10-CM | POA: Diagnosis not present

## 2022-03-06 DIAGNOSIS — C8338 Diffuse large B-cell lymphoma, lymph nodes of multiple sites: Secondary | ICD-10-CM

## 2022-03-06 DIAGNOSIS — C8510 Unspecified B-cell lymphoma, unspecified site: Secondary | ICD-10-CM | POA: Insufficient documentation

## 2022-03-06 DIAGNOSIS — B1910 Unspecified viral hepatitis B without hepatic coma: Secondary | ICD-10-CM | POA: Diagnosis not present

## 2022-03-06 LAB — CBC WITH DIFFERENTIAL (CANCER CENTER ONLY)
Abs Immature Granulocytes: 0.01 10*3/uL (ref 0.00–0.07)
Basophils Absolute: 0 10*3/uL (ref 0.0–0.1)
Basophils Relative: 1 %
Eosinophils Absolute: 0.2 10*3/uL (ref 0.0–0.5)
Eosinophils Relative: 3 %
HCT: 37.4 % (ref 36.0–46.0)
Hemoglobin: 12.6 g/dL (ref 12.0–15.0)
Immature Granulocytes: 0 %
Lymphocytes Relative: 40 %
Lymphs Abs: 1.8 10*3/uL (ref 0.7–4.0)
MCH: 27.5 pg (ref 26.0–34.0)
MCHC: 33.7 g/dL (ref 30.0–36.0)
MCV: 81.5 fL (ref 80.0–100.0)
Monocytes Absolute: 0.3 10*3/uL (ref 0.1–1.0)
Monocytes Relative: 6 %
Neutro Abs: 2.2 10*3/uL (ref 1.7–7.7)
Neutrophils Relative %: 50 %
Platelet Count: 232 10*3/uL (ref 150–400)
RBC: 4.59 MIL/uL (ref 3.87–5.11)
RDW: 16.3 % — ABNORMAL HIGH (ref 11.5–15.5)
WBC Count: 4.4 10*3/uL (ref 4.0–10.5)
nRBC: 0 % (ref 0.0–0.2)

## 2022-03-06 LAB — CMP (CANCER CENTER ONLY)
ALT: 18 U/L (ref 0–44)
AST: 16 U/L (ref 15–41)
Albumin: 4.2 g/dL (ref 3.5–5.0)
Alkaline Phosphatase: 80 U/L (ref 38–126)
Anion gap: 3 — ABNORMAL LOW (ref 5–15)
BUN: 13 mg/dL (ref 6–20)
CO2: 32 mmol/L (ref 22–32)
Calcium: 9.5 mg/dL (ref 8.9–10.3)
Chloride: 103 mmol/L (ref 98–111)
Creatinine: 0.86 mg/dL (ref 0.44–1.00)
GFR, Estimated: >60 mL/min (ref >60)
Glucose, Bld: 144 mg/dL — ABNORMAL HIGH (ref 70–99)
Potassium: 3.9 mmol/L (ref 3.5–5.1)
Sodium: 138 mmol/L (ref 135–145)
Total Bilirubin: 0.3 mg/dL (ref 0.3–1.2)
Total Protein: 6.7 g/dL (ref 6.5–8.1)

## 2022-03-06 LAB — LACTATE DEHYDROGENASE: LDH: 203 U/L — ABNORMAL HIGH (ref 98–192)

## 2022-03-09 ENCOUNTER — Encounter: Payer: Self-pay | Admitting: Hematology

## 2022-03-09 NOTE — Progress Notes (Signed)
? ? ?HEMATOLOGY/ONCOLOGY CLINIC NOTE ? ?Date of Service: .03/06/2022 ? ?Patient Care Team: ?[name], MD as PCP - General (Internal Medicine) ? ?CHIEF COMPLAINTS/PURPOSE OF CONSULTATION:  ?Follow-up for continued evaluation and management of large B-cell lymphoma ? ?HISTORY OF PRESENTING ILLNESS:  ?Please see previous notes for details on initial presentation. ? ?INTERVAL HISTORY: ? ?Heather [name] is here for her scheduled 43-monthfollow-up for continued evaluation and management of large B-cell lymphoma . ?She notes she has been doing well and has no acute symptoms.  Notes that she has been gaining some weight. ?Her HIV/AIDS has been well controlled with her current regimen and her viral load has been well controlled. ?No fevers no chills no night sweats no unexpected new fatigue. ?No new skin rashes. ?She is following up with plastic surgery to address her keloid at the site of her previous port and is receiving intralesional steroids. ?Labs done today were reviewed in detail with the patient. ? ? ?MEDICAL HISTORY:  ?Past Medical History:  ?Diagnosis Date  ? Anemia   ? Diffuse large B-cell lymphoma of lymph nodes of multiple regions [place] 04/12/[place]  ? Hep B w/o coma, chronic, w/o delta (HCC)   ? History of blood transfusion   ? childhood  ? HIV (human immunodeficiency virus infection) ([facility]   ? Immune deficiency disorder ([facility]   ? ? ?SURGICAL HISTORY: ?Past Surgical History:  ?Procedure Laterality Date  ? CHROMOPERTUBATION Bilateral 11/29/2016  ? Procedure: CHROMOPERTUBATION;  Surgeon: EWaymon Amato MD;  [facility];  Service: Gynecology;  Laterality: Bilateral;  fallopian tubes  ? ENDOMETRIAL BIOPSY    ? IR IMAGING GUIDED PORT INSERTION  04/15/2020  ? IR REMOVAL TUN ACCESS W/ PORT W/O FL MOD SED  10/02/2021  ? IR THORACENTESIS ASP PLEURAL SPACE W/IMG GUIDE  04/15/2020  ? MYOMECTOMY N/A 11/29/2016  ? Procedure: MYOMECTOMY;  Surgeon: EWaymon Amato MD;  [facility];  Service: Gynecology;  Laterality:  N/A;  ? ? ?SOCIAL HISTORY: ?Social History  ? ?Socioeconomic History  ? Marital status: Married  ?  Spouse name: Not on file  ? Number of children: Not on file  ? Years of education: Not on file  ? Highest education level: Not on file  ?Occupational History  ? Not on file  ?Tobacco Use  ? Smoking status: Never  ? Smokeless tobacco: Never  ?Substance and Sexual Activity  ? Alcohol use: Yes  ?  Comment: occ  ? Drug use: No  ? Sexual activity: Yes  ?Other Topics Concern  ? Not on file  ?Social History Narrative  ? Not on file  ? ?Social Determinants of Health  ? ?Financial Resource Strain: Not on file  ?Food Insecurity: Not on file  ?Transportation Needs: Not on file  ?Physical Activity: Not on file  ?Stress: Not on file  ?Social Connections: Not on file  ?Intimate Partner Violence: Not on file  ? ? ?FAMILY HISTORY: ?Family History  ?Problem Relation Age of Onset  ? Diabetes Mother   ? Hypertension Mother   ? ? ?ALLERGIES:  is allergic to benadryl allergy [diphenhydramine hcl]. ? ?MEDICATIONS:  ?Current Outpatient Medications  ?Medication Sig Dispense Refill  ? B Complex Vitamins (B COMPLEX PO) Take 1 tablet by mouth daily.    ? bictegravir-emtricitabine-tenofovir AF (BIKTARVY) 50-200-25 MG TABS tablet Take 1 tablet by mouth daily with breakfast.     ? cholecalciferol (VITAMIN D3) 25 MCG (1000 UNIT) tablet Take 1 tablet (1,000 Units total) by mouth at bedtime.    ?  ibuprofen (ADVIL) 200 MG tablet Take 2 tablets (400 mg total) by mouth every 6 (six) hours as needed for mild pain. 30 tablet 0  ? Multiple Vitamin (MULTIVITAMIN WITH MINERALS) TABS tablet Take 1 tablet by mouth at bedtime.     ? polyethylene glycol (MIRALAX / GLYCOLAX) 17 g packet Take 17 g by mouth daily as needed for mild constipation. 14 each 0  ? acetaminophen (TYLENOL) 500 MG tablet Take 500 mg by mouth every 6 (six) hours as needed for mild pain or headache.    ? acyclovir (ZOVIRAX) 400 MG tablet TAKE 1 TABLET BY MOUTH TWICE A DAY 180 tablet 2  ?  feeding supplement, ENSURE ENLIVE, (ENSURE ENLIVE) LIQD Take 237 mLs by mouth 2 (two) times daily between meals. (Patient not taking: Reported on 03/06/2022) 237 mL 12  ? HYDROcodone-acetaminophen (NORCO) 5-325 MG tablet Take 1 tablet by mouth every 6 (six) hours as needed for moderate pain. (Patient not taking: Reported on 03/06/2022) 30 tablet 0  ? lidocaine-prilocaine (EMLA) cream Apply to affected area once (Patient not taking: Reported on 03/06/2022) 30 g 3  ? LORazepam (ATIVAN) 0.5 MG tablet Take 1 tablet (0.5 mg total) by mouth every 6 (six) hours as needed (for chemo induced nausea or vomiting). 30 tablet 0  ? ondansetron (ZOFRAN) 8 MG tablet Take 1 tablet (8 mg total) by mouth every 8 (eight) hours as needed for nausea or vomiting. (Patient not taking: Reported on 03/06/2022) 30 tablet 1  ? prochlorperazine (COMPAZINE) 10 MG tablet Take 1 tablet (10 mg total) by mouth every 6 (six) hours as needed for nausea or vomiting. (Patient not taking: Reported on 03/06/2022) 30 tablet 1  ? senna-docusate (SENOKOT-S) 8.6-50 MG tablet Take 2 tablets by mouth at bedtime as needed for mild constipation. (Patient not taking: Reported on 03/06/2022)    ? Sodium Chloride-Sodium Bicarb (SODIUM BICARBONATE/SODIUM CHLORIDE) SOLN 1 application by Mouth Rinse route 4 (four) times daily. (Patient not taking: Reported on 03/06/2022)    ? ?No current facility-administered medications for this visit.  ? ? ?REVIEW OF SYSTEMS:   ?10 Point review of Systems was done is negative except as noted above. ? ? ?PHYSICAL EXAMINATION: ?ECOG PERFORMANCE STATUS: 1 - Symptomatic but completely ambulatory ? ?. ?Vitals:  ? 03/06/22 1140  ?BP: 140/83  ?Pulse: 98  ?Resp: 20  ?Temp: (!) 97.5 ?F (36.4 ?C)  ?SpO2: 95%  ? ?Filed Weights  ? 03/06/22 1140  ?Weight: 174 lb 1.6 oz (79 kg)  ? ?.Body mass index is 34 kg/m?.  ?.  NAD ?GENERAL:alert, in no acute distress and comfortable ?SKIN: no acute rashes, no significant lesions ?EYES: conjunctiva are pink and  non-injected, sclera anicteric ?OROPHARYNX: MMM, no exudates, no oropharyngeal erythema or ulceration ?NECK: supple, no JVD ?LYMPH:  no palpable lymphadenopathy in the cervical, axillary or inguinal regions ?LUNGS: clear to auscultation b/l with normal respiratory effort ?HEART: regular rate & rhythm ?ABDOMEN:  normoactive bowel sounds , non tender, not distended. ?Extremity: no pedal edema ?PSYCH: alert & oriented x 3 with fluent speech ?NEURO: no focal motor/sensory deficits ? ?LABORATORY DATA:  ?I have reviewed the data as listed ? ?. ? ?  Latest Ref Rng & Units 03/06/2022  ? 11:11 AM 10/11/2021  ? 10:33 AM 07/07/2021  ? 11:15 AM  ?CBC  ?WBC 4.0 - 10.5 K/uL 4.4   3.6   3.3    ?Hemoglobin 12.0 - 15.0 g/dL 12.6   12.6   13.1    ?Hematocrit 36.0 -  46.0 % 37.4   39.0   39.2    ?Platelets 150 - 400 K/uL 232   209   204    ? ? ?. ? ?  Latest Ref Rng & Units 03/06/2022  ? 11:11 AM 10/11/2021  ? 10:33 AM 07/07/2021  ? 11:15 AM  ?CMP  ?Glucose 70 - 99 mg/dL 144   107   119    ?BUN 6 - 20 mg/dL '13   11   13    '$ ?Creatinine 0.44 - 1.00 mg/dL 0.86   0.84   0.80    ?Sodium 135 - 145 mmol/L 138   142   142    ?Potassium 3.5 - 5.1 mmol/L 3.9   3.9   3.7    ?Chloride 98 - 111 mmol/L 103   105   107    ?CO2 22 - 32 mmol/L 32   26   25    ?Calcium 8.9 - 10.3 mg/dL 9.5   9.6   10.1    ?Total Protein 6.5 - 8.1 g/dL 6.7   7.4   7.2    ?Total Bilirubin 0.3 - 1.2 mg/dL 0.3   0.4   0.4    ?Alkaline Phos 38 - 126 U/L 80   96   100    ?AST 15 - 41 U/L '16   20   23    '$ ?ALT 0 - 44 U/L '18   22   21    '$ ? ?. ?Lab Results  ?Component Value Date  ? LDH 203 (H) 03/06/2022  ? ? ? ?04/08/2020 Pancreatic Mass Surgical Pathology Report [id number]): ? ? ? ?RADIOGRAPHIC STUDIES: ?I have personally reviewed the radiological images as listed and agreed with the findings in the report. ?No results found. ? ?ASSESSMENT & PLAN:  ? 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 ? ?#1  History of stage IV high-grade large B-cell lymphoma currently in remission ?Presented as a pancreatic mass along with retroperitoneal lymphadenopathy and diffuse splenic lesions. ?No internal necrosis within the m

## 2022-03-12 ENCOUNTER — Telehealth: Payer: Self-pay | Admitting: Hematology

## 2022-03-12 NOTE — Telephone Encounter (Signed)
Left message with follow-up appointment per 5/2 los. ?

## 2022-04-11 ENCOUNTER — Other Ambulatory Visit: Payer: Self-pay | Admitting: Oncology

## 2022-05-12 ENCOUNTER — Encounter: Payer: Self-pay | Admitting: Hematology

## 2022-05-29 ENCOUNTER — Other Ambulatory Visit: Payer: Self-pay

## 2022-05-29 DIAGNOSIS — C8338 Diffuse large B-cell lymphoma, lymph nodes of multiple sites: Secondary | ICD-10-CM

## 2022-05-29 NOTE — Progress Notes (Signed)
Referral sent to Plastic Surgery for assessment of keloid scar, per pt's request.

## 2022-06-15 ENCOUNTER — Ambulatory Visit: Payer: BC Managed Care – PPO | Admitting: Plastic Surgery

## 2022-06-15 ENCOUNTER — Encounter: Payer: Self-pay | Admitting: Plastic Surgery

## 2022-06-15 VITALS — BP 116/82 | HR 82 | Ht 60.0 in | Wt 168.4 lb

## 2022-06-15 DIAGNOSIS — C8580 Other specified types of non-Hodgkin lymphoma, unspecified site: Secondary | ICD-10-CM

## 2022-06-15 DIAGNOSIS — L910 Hypertrophic scar: Secondary | ICD-10-CM | POA: Diagnosis not present

## 2022-06-15 NOTE — Progress Notes (Signed)
Patient ID: Heather [name], female    DOB: 04-Sep-1976, 46 y.o.   MRN: [id number]   Chief Complaint  Patient presents with   Consult   Skin Problem    The patient is a 46 year old female here for evaluation of her chest.  She was treated for lymphoma 2 years ago and had a Port-A-Cath placed.  She developed a keloid over the insertion site.  This was excised when she had the Port-A-Cath removed.  It has reoccurred and is approximately 4 cm long and 1 to 1-1/2 cm wide.  She has several hypertrophic scars on her chest and arms.  Some are from chickenpox and some more from pimples.  Her history is listed below.    Review of Systems  Constitutional: Negative.   Eyes: Negative.   Respiratory: Negative.  Negative for chest tightness.   Cardiovascular: Negative.  Negative for leg swelling.  Gastrointestinal: Negative.   Endocrine: Negative.   Genitourinary: Negative.   Musculoskeletal: Negative.     Past Medical History:  Diagnosis Date   Anemia    Diffuse large B-cell lymphoma of lymph nodes of multiple regions ([place]) 04/12/2020   Hep B w/o coma, chronic, w/o delta (HCC)    History of blood transfusion    childhood   HIV (human immunodeficiency virus infection) ([place])    Immune deficiency disorder [facility])     Past Surgical History:  Procedure Laterality Date   CHROMOPERTUBATION Bilateral 11/29/2016   Procedure: CHROMOPERTUBATION;  Surgeon: Waymon Amato, MD;  Location: [facility];  Service: Gynecology;  Laterality: Bilateral;  fallopian tubes   ENDOMETRIAL BIOPSY     IR IMAGING GUIDED PORT INSERTION  04/15/2020   IR REMOVAL TUN ACCESS W/ PORT W/O FL MOD SED  10/02/2021   IR THORACENTESIS ASP PLEURAL SPACE W/IMG GUIDE  04/15/2020   MYOMECTOMY N/A 11/29/2016   Procedure: MYOMECTOMY;  Surgeon: Waymon Amato, MD;  Location: [facility];  Service: Gynecology;  Laterality: N/A;      Current Outpatient Medications:    acetaminophen (TYLENOL) 500 MG tablet, Take 500 mg by mouth every 6 (six) hours as needed  for mild pain or headache., Disp: , Rfl:    acyclovir (ZOVIRAX) 400 MG tablet, TAKE 1 TABLET BY MOUTH TWICE A DAY, Disp: 180 tablet, Rfl: 2   B Complex Vitamins (B COMPLEX PO), Take 1 tablet by mouth daily., Disp: , Rfl:    bictegravir-emtricitabine-tenofovir AF (BIKTARVY) 50-200-25 MG TABS tablet, Take 1 tablet by mouth daily with breakfast. , Disp: , Rfl:    cholecalciferol (VITAMIN D3) 25 MCG (1000 UNIT) tablet, Take 1 tablet (1,000 Units total) by mouth at bedtime., Disp: , Rfl:    feeding supplement, ENSURE ENLIVE, (ENSURE ENLIVE) LIQD, Take 237 mLs by mouth 2 (two) times daily between meals., Disp: 237 mL, Rfl: 12   ibuprofen (ADVIL) 200 MG tablet, Take 2 tablets (400 mg total) by mouth every 6 (six) hours as needed for mild pain., Disp: 30 tablet, Rfl: 0   lidocaine-prilocaine (EMLA) cream, Apply to affected area once, Disp: 30 g, Rfl: 3   Multiple Vitamin (MULTIVITAMIN WITH MINERALS) TABS tablet, Take 1 tablet by mouth at bedtime. , Disp: , Rfl:    ondansetron (ZOFRAN) 8 MG tablet, Take 1 tablet (8 mg total) by mouth every 8 (eight) hours as needed for nausea or vomiting., Disp: 30 tablet, Rfl: 1   polyethylene glycol (MIRALAX / GLYCOLAX) 17 g packet, Take 17 g by mouth daily as needed for mild constipation., Disp:  14 each, Rfl: 0   senna-docusate (SENOKOT-S) 8.6-50 MG tablet, Take 2 tablets by mouth at bedtime as needed for mild constipation., Disp: , Rfl:    Objective:   Vitals:   06/15/22 1301  BP: 116/82  Pulse: 82  SpO2: 98%    Physical Exam Vitals reviewed.  Constitutional:      Appearance: Normal appearance.  HENT:     Head: Normocephalic.  Cardiovascular:     Rate and Rhythm: Normal rate.     Pulses: Normal pulses.  Pulmonary:     Effort: Pulmonary effort is normal.  Musculoskeletal:        General: No swelling or deformity.  Skin:    General: Skin is warm.     Coloration: Skin is not jaundiced or pale.     Findings: Lesion present. No bruising, erythema or rash.   Neurological:     Mental Status: She is alert and oriented to person, place, and time.  Psychiatric:        Mood and Affect: Mood normal.        Behavior: Behavior normal.        Thought Content: Thought content normal.        Judgment: Judgment normal.     Assessment & Plan:  Lymphoma malignant, large cell (HCC)  Keloid of skin  Recommend conservative treatment with Kenalog to start.  I recommend doing at least 3 treatments 2 months apart.  She should start massaging.  She can also use skin Uva and silicone sheets.  We will see how it goes before we decide on excision as it will likely produce a keloid again.  Pictures were obtained of the patient and placed in the chart with the patient's or guardian's permission.   Lee, DO

## 2022-07-06 ENCOUNTER — Other Ambulatory Visit: Payer: Self-pay | Admitting: *Deleted

## 2022-07-06 DIAGNOSIS — C8338 Diffuse large B-cell lymphoma, lymph nodes of multiple sites: Secondary | ICD-10-CM

## 2022-07-10 ENCOUNTER — Inpatient Hospital Stay: Payer: BC Managed Care – PPO | Attending: Hematology | Admitting: Hematology

## 2022-07-10 ENCOUNTER — Other Ambulatory Visit: Payer: Self-pay

## 2022-07-10 ENCOUNTER — Inpatient Hospital Stay: Payer: BC Managed Care – PPO

## 2022-07-10 VITALS — BP 134/88 | HR 88 | Temp 97.9°F | Wt 175.1 lb

## 2022-07-10 DIAGNOSIS — C8513 Unspecified B-cell lymphoma, intra-abdominal lymph nodes: Secondary | ICD-10-CM | POA: Insufficient documentation

## 2022-07-10 DIAGNOSIS — C8338 Diffuse large B-cell lymphoma, lymph nodes of multiple sites: Secondary | ICD-10-CM

## 2022-07-10 DIAGNOSIS — B20 Human immunodeficiency virus [HIV] disease: Secondary | ICD-10-CM | POA: Diagnosis not present

## 2022-07-10 LAB — CMP (CANCER CENTER ONLY)
ALT: 19 U/L (ref 0–44)
AST: 19 U/L (ref 15–41)
Albumin: 4.5 g/dL (ref 3.5–5.0)
Alkaline Phosphatase: 88 U/L (ref 38–126)
Anion gap: 5 (ref 5–15)
BUN: 14 mg/dL (ref 6–20)
CO2: 31 mmol/L (ref 22–32)
Calcium: 9.9 mg/dL (ref 8.9–10.3)
Chloride: 104 mmol/L (ref 98–111)
Creatinine: 0.8 mg/dL (ref 0.44–1.00)
GFR, Estimated: >60 mL/min (ref >60)
Glucose, Bld: 78 mg/dL (ref 70–99)
Potassium: 3.8 mmol/L (ref 3.5–5.1)
Sodium: 140 mmol/L (ref 135–145)
Total Bilirubin: 0.3 mg/dL (ref 0.3–1.2)
Total Protein: 7.4 g/dL (ref 6.5–8.1)

## 2022-07-10 LAB — CBC WITH DIFFERENTIAL (CANCER CENTER ONLY)
Abs Immature Granulocytes: 0.02 10*3/uL (ref 0.00–0.07)
Basophils Absolute: 0 10*3/uL (ref 0.0–0.1)
Basophils Relative: 1 %
Eosinophils Absolute: 0.1 10*3/uL (ref 0.0–0.5)
Eosinophils Relative: 3 %
HCT: 40 % (ref 36.0–46.0)
Hemoglobin: 13.1 g/dL (ref 12.0–15.0)
Immature Granulocytes: 0 %
Lymphocytes Relative: 43 %
Lymphs Abs: 2.3 10*3/uL (ref 0.7–4.0)
MCH: 26.5 pg (ref 26.0–34.0)
MCHC: 32.8 g/dL (ref 30.0–36.0)
MCV: 80.8 fL (ref 80.0–100.0)
Monocytes Absolute: 0.4 10*3/uL (ref 0.1–1.0)
Monocytes Relative: 8 %
Neutro Abs: 2.5 10*3/uL (ref 1.7–7.7)
Neutrophils Relative %: 45 %
Platelet Count: 244 10*3/uL (ref 150–400)
RBC: 4.95 MIL/uL (ref 3.87–5.11)
RDW: 16.3 % — ABNORMAL HIGH (ref 11.5–15.5)
WBC Count: 5.5 10*3/uL (ref 4.0–10.5)
nRBC: 0 % (ref 0.0–0.2)

## 2022-07-10 LAB — LACTATE DEHYDROGENASE: LDH: 202 U/L — ABNORMAL HIGH (ref 98–192)

## 2022-07-10 NOTE — Progress Notes (Signed)
HEMATOLOGY/ONCOLOGY CLINIC NOTE  Date of Service: 07/10/2022  Patient Care Team: [name], MD as PCP - General (Internal Medicine)  CHIEF COMPLAINTS/PURPOSE OF CONSULTATION:  Follow-up for continued evaluation and management of large B-cell lymphoma  HISTORY OF PRESENTING ILLNESS:  Please see previous notes for details on initial presentation.  INTERVAL HISTORY: Heather [name] is here for her scheduled 64-monthfollow-up for continued evaluation and management of large B-cell lymphoma. She reports She is doing well with no new symptoms or concerns.  Her HIV/AIDS has been well controlled with her current regimen and her viral load has been well controlled.  She is scheduled for intralesional steroid treatment with Dr. DMarla Roeon 07/25/2022.   No fever, chills, night sweats. No new lumps, bumps, or lesions/rashes. No new fatigue. No other new or acute focal symptoms.  Labs done today were reviewed in detail with the patient.  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)    History of blood transfusion    childhood   HIV (human immunodeficiency virus infection) ([place]    Immune deficiency disorder ([facility]     SURGICAL HISTORY: Past Surgical History:  Procedure Laterality Date   CHROMOPERTUBATION Bilateral 11/29/2016   Procedure: CHROMOPERTUBATION;  Surgeon: EWaymon Amato MD;  [facility];  Service: Gynecology;  Laterality: Bilateral;  fallopian tubes   ENDOMETRIAL BIOPSY     IR IMAGING GUIDED PORT INSERTION  04/15/2020   IR REMOVAL TUN ACCESS W/ PORT W/O FL MOD SED  10/02/2021   IR THORACENTESIS ASP PLEURAL SPACE W/IMG GUIDE  04/15/2020   MYOMECTOMY N/A 11/29/2016   Procedure: MYOMECTOMY;  Surgeon: EWaymon Amato MD;  [facility];  Service: Gynecology;  Laterality: N/A;    SOCIAL HISTORY: Social History    Socioeconomic History   Marital status: Married    Spouse name: Not on file   Number of children: Not on file   Years of education: Not on file   Highest education level: Not on file  Occupational History   Not on file  Tobacco Use   Smoking status: Never   Smokeless tobacco: Never  Substance and Sexual Activity   Alcohol use: Yes    Comment: occ   Drug use: No   Sexual activity: Yes  Other Topics Concern   Not on file  Social History Narrative   Not on file   Social Determinants of Health   Financial Resource Strain: Not on file  Food Insecurity: Not on file  Transportation Needs: Not on file  Physical Activity: Not on file  Stress: Not on file  Social Connections: Not on file  Intimate Partner Violence: Not on file    FAMILY HISTORY: Family History  Problem Relation Age of Onset   Diabetes Mother    Hypertension Mother     ALLERGIES:  is allergic to benadryl allergy [diphenhydramine hcl].  MEDICATIONS:  Current Outpatient Medications  Medication Sig Dispense Refill   acetaminophen (TYLENOL) 500 MG tablet Take 500 mg by mouth every 6 (six) hours as needed for mild pain or headache.     acyclovir (ZOVIRAX) 400 MG tablet TAKE 1 TABLET BY MOUTH TWICE A DAY 180 tablet 2   B Complex Vitamins (B COMPLEX PO) Take 1 tablet by mouth daily.     bictegravir-emtricitabine-tenofovir AF (BIKTARVY) 50-200-25 MG TABS tablet Take  1 tablet by mouth daily with breakfast.      cholecalciferol (VITAMIN D3) 25 MCG (1000 UNIT) tablet Take 1 tablet (1,000 Units total) by mouth at bedtime.     feeding supplement, ENSURE ENLIVE, (ENSURE ENLIVE) LIQD Take 237 mLs by mouth 2 (two) times daily between meals. 237 mL 12   ibuprofen (ADVIL) 200 MG tablet Take 2 tablets (400 mg total) by mouth every 6 (six) hours as needed for mild pain. 30 tablet 0   lidocaine-prilocaine (EMLA) cream Apply to affected area once 30 g 3   Multiple Vitamin (MULTIVITAMIN WITH MINERALS) TABS tablet Take 1 tablet  by mouth at bedtime.      ondansetron (ZOFRAN) 8 MG tablet Take 1 tablet (8 mg total) by mouth every 8 (eight) hours as needed for nausea or vomiting. 30 tablet 1   polyethylene glycol (MIRALAX / GLYCOLAX) 17 g packet Take 17 g by mouth daily as needed for mild constipation. 14 each 0   senna-docusate (SENOKOT-S) 8.6-50 MG tablet Take 2 tablets by mouth at bedtime as needed for mild constipation.     No current facility-administered medications for this visit.    REVIEW OF SYSTEMS:   10 Point review of Systems was done is negative except as noted above.   PHYSICAL EXAMINATION: ECOG PERFORMANCE STATUS: 1 - Symptomatic but completely ambulatory  . Vitals:   07/10/22 1413  BP: 134/88  Pulse: 88  Temp: 97.9 F (36.6 C)  SpO2: 98%   Filed Weights   07/10/22 1413  Weight: 175 lb 1.6 oz (79.4 kg)   .Body mass index is 34.2 kg/m.  NAD GENERAL:alert, in no acute distress and comfortable SKIN: no acute rashes, no significant lesions EYES: conjunctiva are pink and non-injected, sclera anicteric NECK: supple, no JVD LYMPH:  no palpable lymphadenopathy in the cervical, axillary or inguinal regions LUNGS: clear to auscultation b/l with normal respiratory effort HEART: regular rate & rhythm ABDOMEN:  normoactive bowel sounds , non tender, not distended. Extremity: no pedal edema PSYCH: alert & oriented x 3 with fluent speech NEURO: no focal motor/sensory deficits  LABORATORY DATA:  I have reviewed the data as listed  .    Latest Ref Rng & Units 07/10/2022    1:57 PM 03/06/2022   11:11 AM 10/11/2021   10:33 AM  CBC  WBC 4.0 - 10.5 K/uL 5.5  4.4  3.6   Hemoglobin 12.0 - 15.0 g/dL 13.1  12.6  12.6   Hematocrit 36.0 - 46.0 % 40.0  37.4  39.0   Platelets 150 - 400 K/uL 244  232  209     .    Latest Ref Rng & Units 07/10/2022    1:57 PM 03/06/2022   11:11 AM 10/11/2021   10:33 AM  CMP  Glucose 70 - 99 mg/dL 78  144  107   BUN 6 - 20 mg/dL '14  13  11   '$ Creatinine 0.44 - 1.00 mg/dL  0.80  0.86  0.84   Sodium 135 - 145 mmol/L 140  138  142   Potassium 3.5 - 5.1 mmol/L 3.8  3.9  3.9   Chloride 98 - 111 mmol/L 104  103  105   CO2 22 - 32 mmol/L 31  32  26   Calcium 8.9 - 10.3 mg/dL 9.9  9.5  9.6   Total Protein 6.5 - 8.1 g/dL 7.4  6.7  7.4   Total Bilirubin 0.3 - 1.2 mg/dL 0.3  0.3  0.4  Alkaline Phos 38 - 126 U/L 88  80  96   AST 15 - 41 U/L '19  16  20   '$ ALT 0 - 44 U/L '19  18  22    '$ . Lab Results  Component Value Date   LDH 202 (H) 07/10/2022     04/08/2020 Pancreatic Mass Surgical Pathology Report [id number]):    RADIOGRAPHIC STUDIES: I have personally reviewed the radiological images as listed and agreed with the findings in the report. No results found.  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 No internal necrosis within the mass noted. Diffuse splenomegaly. Left periaortic lymph node causing mild displacement of the third portion of the duodenum.   HIV could be risk factors for high grade EBV driven lymphomas   #2 history of HIV/AIDS last VL undetectable follows with Dr. Melynda Keller and at [facility].   #3 h/o Hepatitis B - last VL undetectable  PLAN: -Discussed patient's labs from today showing stable CBC, stable CMP and LDH within normal limits.. -Patient has no clinical signs or symptoms of lymphoma progression at this time. -No indication for further treatment of the patient's lymphoma at this time which continues to remain in remission. -She will continue to follow-up with her infectious disease doctor at [facility] for continued management of her HIV and hepatitis B. -Patient is in good spirits and has been doing  well. -We will be seeing her every 4 months for the first 2 years and then every 6 months for 2 to 3 years followed by yearly follow-ups. -She is scheduled for intralesional steroid treatment with Dr. Marla Roe on 07/25/2022.   #4 tendency to have keloids Receiving intralesional steroid treatment for significant keloid at the Port-A-Cath site.  FOLLOW  UP: RTC with Dr Irene Limbo with labs in 6 months  The total time spent in the appointment was 20 minutes*.  All of the patient's questions were answered with apparent satisfaction. The patient knows to call the clinic with any problems, questions or concerns.   Sullivan Lone MD MS AAHIVMS [facility] Hematology/Oncology Physician [facility]  .*Total Encounter Time as defined by the Centers for Medicare and Medicaid Services includes, in addition to the face-to-face time of a patient visit (documented in the note above) non-face-to-face time: obtaining and reviewing outside history, ordering and reviewing medications, tests or procedures, care coordination (communications with other health care professionals or caregivers) and documentation in the medical record.  I, Melene Muller, am acting as scribe for Dr. Sullivan Lone, MD. .I have reviewed the above documentation for accuracy and completeness, and I agree with the above. Brunetta Genera MD

## 2022-07-13 ENCOUNTER — Telehealth: Payer: Self-pay | Admitting: Hematology

## 2022-07-13 NOTE — Telephone Encounter (Signed)
Unable to leave message with follow-up appointment per 9/5 los. Mailed calendar.

## 2022-07-31 ENCOUNTER — Encounter: Payer: Self-pay | Admitting: Plastic Surgery

## 2022-07-31 ENCOUNTER — Ambulatory Visit: Payer: BC Managed Care – PPO | Admitting: Plastic Surgery

## 2022-07-31 DIAGNOSIS — L910 Hypertrophic scar: Secondary | ICD-10-CM

## 2022-07-31 NOTE — Progress Notes (Signed)
Procedure Note  Preoperative Dx: Keloid of chest  Postoperative Dx: Same  Procedure: 1.5 x 10 cm keloid of chest  Anesthesia: Lidocaine 1% with 1:100,000 epinephrine   Description of Procedure: Risks and complications were explained to the patient.  Consent was confirmed and the patient understands the risks and benefits.  The potential complications and alternatives were explained and the patient consents.  The patient expressed understanding the option of not having the procedure and the risks of a scar.  Time out was called and all information was confirmed to be correct.    The area was prepped and drapped.  Lidocaine 1% with epinepherine 0.1 cc was mixed with Kenalog 50/5 0.4 cc.  The mixture was injected into the 10 cm chest keloid. Heather [name] tolerated the procedure well and there were no complications.

## 2022-09-13 ENCOUNTER — Encounter (HOSPITAL_BASED_OUTPATIENT_CLINIC_OR_DEPARTMENT_OTHER): Payer: Self-pay

## 2022-09-13 ENCOUNTER — Encounter: Payer: Self-pay | Admitting: Hematology

## 2022-09-13 ENCOUNTER — Emergency Department (HOSPITAL_BASED_OUTPATIENT_CLINIC_OR_DEPARTMENT_OTHER): Payer: BC Managed Care – PPO

## 2022-09-13 ENCOUNTER — Other Ambulatory Visit: Payer: Self-pay

## 2022-09-13 ENCOUNTER — Emergency Department (HOSPITAL_BASED_OUTPATIENT_CLINIC_OR_DEPARTMENT_OTHER)
Admission: EM | Admit: 2022-09-13 | Discharge: 2022-09-13 | Disposition: A | Discharge status: Home / Self Care | Payer: BC Managed Care – PPO | Attending: Emergency Medicine | Admitting: Emergency Medicine

## 2022-09-13 DIAGNOSIS — R519 Headache, unspecified: Secondary | ICD-10-CM | POA: Insufficient documentation

## 2022-09-13 DIAGNOSIS — R509 Fever, unspecified: Secondary | ICD-10-CM | POA: Insufficient documentation

## 2022-09-13 DIAGNOSIS — M546 Pain in thoracic spine: Secondary | ICD-10-CM | POA: Insufficient documentation

## 2022-09-13 DIAGNOSIS — Z8572 Personal history of non-Hodgkin lymphomas: Secondary | ICD-10-CM | POA: Diagnosis not present

## 2022-09-13 DIAGNOSIS — M791 Myalgia, unspecified site: Secondary | ICD-10-CM | POA: Insufficient documentation

## 2022-09-13 DIAGNOSIS — Z21 Asymptomatic human immunodeficiency virus [HIV] infection status: Secondary | ICD-10-CM | POA: Insufficient documentation

## 2022-09-13 DIAGNOSIS — Z20822 Contact with and (suspected) exposure to covid-19: Secondary | ICD-10-CM | POA: Insufficient documentation

## 2022-09-13 DIAGNOSIS — R9389 Abnormal findings on diagnostic imaging of other specified body structures: Secondary | ICD-10-CM | POA: Insufficient documentation

## 2022-09-13 DIAGNOSIS — R109 Unspecified abdominal pain: Secondary | ICD-10-CM | POA: Insufficient documentation

## 2022-09-13 LAB — LIPASE, BLOOD: Lipase: 37 U/L (ref 11–51)

## 2022-09-13 LAB — URINALYSIS, ROUTINE W REFLEX MICROSCOPIC
Bilirubin Urine: NEGATIVE
Glucose, UA: NEGATIVE mg/dL
Hgb urine dipstick: NEGATIVE
Ketones, ur: NEGATIVE mg/dL
Leukocytes,Ua: NEGATIVE
Nitrite: NEGATIVE
Protein, ur: NEGATIVE mg/dL
Specific Gravity, Urine: 1.02 (ref 1.005–1.030)
pH: 8.5 — ABNORMAL HIGH (ref 5.0–8.0)

## 2022-09-13 LAB — COMPREHENSIVE METABOLIC PANEL
ALT: 27 U/L (ref 0–44)
AST: 27 U/L (ref 15–41)
Albumin: 4.1 g/dL (ref 3.5–5.0)
Alkaline Phosphatase: 476 U/L — ABNORMAL HIGH (ref 38–126)
Anion gap: 9 (ref 5–15)
BUN: 9 mg/dL (ref 6–20)
CO2: 25 mmol/L (ref 22–32)
Calcium: 9 mg/dL (ref 8.9–10.3)
Chloride: 100 mmol/L (ref 98–111)
Creatinine, Ser: 0.85 mg/dL (ref 0.44–1.00)
GFR, Estimated: >60 mL/min (ref >60)
Glucose, Bld: 112 mg/dL — ABNORMAL HIGH (ref 70–99)
Potassium: 3.7 mmol/L (ref 3.5–5.1)
Sodium: 134 mmol/L — ABNORMAL LOW (ref 135–145)
Total Bilirubin: 0.6 mg/dL (ref 0.3–1.2)
Total Protein: 7.7 g/dL (ref 6.5–8.1)

## 2022-09-13 LAB — RESP PANEL BY RT-PCR (FLU A&B, COVID) ARPGX2
Influenza A by PCR: NEGATIVE
Influenza B by PCR: NEGATIVE
SARS Coronavirus 2 by RT PCR: NEGATIVE

## 2022-09-13 LAB — CBC
HCT: 41.1 % (ref 36.0–46.0)
Hemoglobin: 13.5 g/dL (ref 12.0–15.0)
MCH: 26.7 pg (ref 26.0–34.0)
MCHC: 32.8 g/dL (ref 30.0–36.0)
MCV: 81.4 fL (ref 80.0–100.0)
Platelets: 278 10*3/uL (ref 150–400)
RBC: 5.05 MIL/uL (ref 3.87–5.11)
RDW: 15.7 % — ABNORMAL HIGH (ref 11.5–15.5)
WBC: 7.8 10*3/uL (ref 4.0–10.5)
nRBC: 0 % (ref 0.0–0.2)

## 2022-09-13 LAB — LACTIC ACID, PLASMA: Lactic Acid, Venous: 1 mmol/L (ref 0.5–1.9)

## 2022-09-13 LAB — PREGNANCY, URINE: Preg Test, Ur: NEGATIVE

## 2022-09-13 MED ORDER — LACTATED RINGERS IV BOLUS
1000.0000 mL | Freq: Once | INTRAVENOUS | Status: AC
  Administered 2022-09-13: 1000 mL via INTRAVENOUS

## 2022-09-13 MED ORDER — IOHEXOL 300 MG/ML  SOLN
100.0000 mL | Freq: Once | INTRAMUSCULAR | Status: AC | PRN
  Administered 2022-09-13: 100 mL via INTRAVENOUS

## 2022-09-13 MED ORDER — ACETAMINOPHEN 325 MG PO TABS
650.0000 mg | ORAL_TABLET | Freq: Once | ORAL | Status: AC
  Administered 2022-09-13: 650 mg via ORAL
  Filled 2022-09-13: qty 2

## 2022-09-13 NOTE — ED Triage Notes (Signed)
Pt c/o headache, abd pain and back pain. Pt states pain started last night. Pt has had chills and fever.  Pt endorses nausea. Denies emesis or diarrhea. Denies cough. Pt states urine has been cloudy.

## 2022-09-13 NOTE — Discharge Instructions (Addendum)
You were seen today for evaluation of your back pain.  You do have a fever, your back pain could be from body aches, fever.  You did have some abnormal findings of your spine.  Like for you to follow-up with your PCP or oncologist regarding this.  You can take Tylenol or ibuprofen as needed for pain and also control your fever.  If you have any concerns, new or worsening symptoms, please return to the nearest emergency room and for evaluation.  Contact a health care provider if: You have pain that is not relieved with rest or medicine. You have increasing pain going down into your legs or buttocks. Your pain does not improve after 2 weeks. You have pain at night. You lose weight without trying. You have a fever or chills. You develop nausea or vomiting. You develop abdominal pain. Get help right away if: You develop new bowel or bladder control problems. You have unusual weakness or numbness in your arms or legs. You feel faint. These symptoms may represent a serious problem that is an emergency. Do not wait to see if the symptoms will go away. Get medical help right away. Call your local emergency services (911 in the [place] Do not drive yourself to the hospital.  Contact a doctor if: You throw up (vomit). You cannot eat or drink without throwing up. You have watery poop (diarrhea). It hurts when you pee. Your symptoms do not get better with treatment. You have new symptoms. You feel very weak. Get help right away if: You are short of breath or have trouble breathing. You are dizzy or you pass out (faint). You feel mixed up (confused). You have signs of not having enough water in your body, such as: Dark pee, very little pee, or no pee. Cracked lips. Dry mouth. Sunken eyes. Sleepiness. Weakness. You have very bad pain in your belly (abdomen). You keep throwing up or having watery poop. You have a rash on your skin. Your symptoms get worse all of a sudden.

## 2022-09-13 NOTE — ED Provider Notes (Signed)
[facility] EMERGENCY DEPARTMENT Provider Note   CSN: [id number] Arrival date & time: 09/13/22  1442     History Chief Complaint  Patient presents with   Headache   Abdominal Pain    Heather [name] is a 46 y.o. 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  Patient reports that it started yesterday, but worsened today.  She reports that it was pain with walking.  She also felt she was having some epigastric pain as well.  No nausea or vomiting.  No recorded fevers at home.  No recent cough or cold symptoms.  No abdominal pain, nausea, or vomiting.  No urinary/fecal incontinence, IV drug use, or saddle anesthesia.  Patient reports that her CD4 counts are well.   Headache Associated symptoms: abdominal pain and myalgias   Associated symptoms: no cough, no diarrhea, no fever, no nausea and no vomiting   Abdominal Pain Associated symptoms: no chills, no constipation, no cough, no diarrhea, no fever, no nausea, no shortness of breath and no vomiting        Home Medications Prior to Admission medications   Medication Sig Start Date End Date Taking? Authorizing Provider  acetaminophen (TYLENOL) 500 MG tablet Take 500 mg by mouth every 6 (six) hours as needed for mild pain or headache.    [provider]  acyclovir (ZOVIRAX) 400 MG tablet TAKE 1 TABLET BY MOUTH TWICE A DAY 12/12/20   Brunetta Genera, MD  B Complex Vitamins (B COMPLEX PO) Take 1 tablet by mouth daily.    [provider]  bictegravir-emtricitabine-tenofovir AF (BIKTARVY) 50-200-25 MG TABS tablet Take 1 tablet by mouth daily with breakfast.     [provider]  cholecalciferol (VITAMIN D3) 25 MCG (1000 UNIT) tablet Take 1 tablet (1,000 Units total) by mouth at bedtime. 05/13/20   Maryanna Shape, NP  feeding supplement, ENSURE ENLIVE, (ENSURE  ENLIVE) LIQD Take 237 mLs by mouth 2 (two) times daily between meals. 04/22/20   Maryanna Shape, NP  ibuprofen (ADVIL) 200 MG tablet Take 2 tablets (400 mg total) by mouth every 6 (six) hours as needed for mild pain. 05/13/20   Maryanna Shape, NP  lidocaine-prilocaine (EMLA) cream Apply to affected area once 04/12/20   Brunetta Genera, MD  Multiple Vitamin (MULTIVITAMIN WITH MINERALS) TABS tablet Take 1 tablet by mouth at bedtime.     [provider]  ondansetron (ZOFRAN) 8 MG tablet Take 1 tablet (8 mg total) by mouth every 8 (eight) hours as needed for nausea or vomiting. 07/26/20   Brunetta Genera, MD  polyethylene glycol (MIRALAX / GLYCOLAX) 17 g packet Take 17 g by mouth daily as needed for mild constipation. 04/22/20   Maryanna Shape, NP  senna-docusate (SENOKOT-S) 8.6-50 MG tablet Take 2 tablets by mouth at bedtime as needed for mild constipation. 06/03/20   Maryanna Shape, NP      Allergies    Benadryl allergy [diphenhydramine hcl]    Review of Systems   Review of Systems  Constitutional:  Negative for chills and fever.  Respiratory:  Negative for cough and shortness of breath.   Gastrointestinal:  Positive for abdominal pain. Negative for constipation, diarrhea, nausea and vomiting.  Musculoskeletal:  Positive for myalgias.  Neurological:  Positive for headaches.    Physical Exam Updated Vital Signs BP 138/84  Pulse (!) 106   Temp (!) 102 F (38.9 C) (Rectal)   Resp 16   Ht 5' (1.524 m)   Wt 79.4 kg   LMP 05/20/2020   SpO2 98%   BMI 34.18 kg/m  Physical Exam Vitals and nursing note reviewed.  Constitutional:      General: She is not in acute distress.    Appearance: Normal appearance. She is not ill-appearing or toxic-appearing.  HENT:     Head: Normocephalic and atraumatic.  Eyes:     General: No scleral icterus. Cardiovascular:     Rate and Rhythm: Normal rate and regular rhythm.  Pulmonary:     Effort: Pulmonary effort is normal.      Breath sounds: Normal breath sounds.  Abdominal:     General: Abdomen is flat. Bowel sounds are normal.     Palpations: Abdomen is soft.  Musculoskeletal:        General: No deformity.     Cervical back: Normal range of motion.     Comments: Diffuse bilateral paraspinal thoracic/lumbar tenderness.  No focal.  No step-offs or deformities the midline noted.  No increased erythema or warmth noted to the midline as well.  No overlying skin changes noted.  Negative straight leg raise.  Sensation intact.  Strength intact.  Skin:    General: Skin is warm and dry.  Neurological:     General: No focal deficit present.     Mental Status: She is alert. Mental status is at baseline.     ED Results / Procedures / Treatments   Labs (all labs ordered are listed, but only abnormal results are displayed) Labs Reviewed  COMPREHENSIVE METABOLIC PANEL - Abnormal; Notable for the following components:      Result Value   Sodium 134 (*)    Glucose, Bld 112 (*)    Alkaline Phosphatase 476 (*)    All other components within normal limits  CBC - Abnormal; Notable for the following components:   RDW 15.7 (*)    All other components within normal limits  URINALYSIS, ROUTINE W REFLEX MICROSCOPIC - Abnormal; Notable for the following components:   pH 8.5 (*)    All other components within normal limits  RESP PANEL BY RT-PCR (FLU A&B, COVID) ARPGX2  CULTURE, BLOOD (ROUTINE X 2)  CULTURE, BLOOD (ROUTINE X 2)  LIPASE, BLOOD  PREGNANCY, URINE  LACTIC ACID, PLASMA  LACTIC ACID, PLASMA    EKG None  Radiology CT CHEST ABDOMEN PELVIS W CONTRAST  Result Date: 09/13/2022 CLINICAL DATA:  Metastatic disease evaluation. Lymphoma in remission. Alkaline phosphatase elevated. * Tracking Code: BO * EXAM: CT CHEST, ABDOMEN, AND PELVIS WITH CONTRAST TECHNIQUE: Multidetector CT imaging of the chest, abdomen and pelvis was performed following the standard protocol during bolus administration of intravenous contrast.  RADIATION DOSE REDUCTION: This exam was performed according to the departmental dose-optimization program which includes automated exposure control, adjustment of the mA and/or kV according to patient size and/or use of iterative reconstruction technique. CONTRAST:  170m OMNIPAQUE IOHEXOL 300 MG/ML  SOLN COMPARISON:  None Available. FINDINGS: CT CHEST FINDINGS Cardiovascular: No significant vascular findings. Normal heart size. No pericardial effusion. Mediastinum/Nodes: No axillary or supraclavicular adenopathy. No mediastinal or hilar adenopathy. No pericardial fluid. Esophagus normal. Lungs/Pleura: No suspicious pulmonary nodules. Normal pleural. Airways normal. Musculoskeletal: No aggressive osseous lesion. CT ABDOMEN AND PELVIS FINDINGS Hepatobiliary: No focal hepatic lesion. No biliary ductal dilatation. Gallbladder is normal. Common bile duct is normal. Pancreas: Pancreas is normal. No ductal  dilatation. No pancreatic inflammation. Spleen: Spleen is irregular in shape but nonenlarged. Cysts within the anterior spleen simple fluid attenuation unchanged from comparison PET-CT scan. Adrenals/urinary tract: Adrenal glands and kidneys are normal. The ureters and bladder normal. Stomach/Bowel: Stomach, small bowel, appendix, and cecum are normal. The colon and rectosigmoid colon are normal. Vascular/Lymphatic: Abdominal aorta is normal caliber. There is no retroperitoneal or periportal lymphadenopathy. No pelvic lymphadenopathy. Reproductive: Lobular enhancement of the uterus consistent leiomyoma. Other: Cutaneous thickening midline lower abdomen (image 102/2 Musculoskeletal: No aggressive osseous lesion. IMPRESSION: 1. No evidence of lymphoma recurrence on CT of the chest abdomen pelvis. 2. Irregular shaped spleen not changed from prior.  No splenomegaly. 3. Lobular uterus most consistent benign leiomyoma. 4. Cutaneous thickening in the lower abdominal wall. Electronically Signed   By: Suzy Bouchard M.D.   On:  09/13/2022 18:29   CT L-SPINE NO CHARGE  Result Date: 09/13/2022 CLINICAL DATA:  Metastatic disease evaluation. Lymphoma in remission with elevated alkaline phosphatase. EXAM: CT THORACIC AND LUMBAR SPINE WITHOUT AND WITH CONTRAST TECHNIQUE: Multidetector CT imaging of the thoracic and lumbar spine was performed without and with contrast. Multiplanar CT image reconstructions were also generated. RADIATION DOSE REDUCTION: This exam was performed according to the departmental dose-optimization program which includes automated exposure control, adjustment of the mA and/or kV according to patient size and/or use of iterative reconstruction technique. COMPARISON:  None Available. CONTRAST:  100 mL Omnipaque 300 IV FINDINGS: CT THORACIC SPINE FINDINGS Alignment: Normal Vertebrae: Negative for fracture Ill-defined hypodensities in the vertebral bodies of C7, T1, T2 of undetermined etiology. Paraspinal and other soft tissues: 6 mm left thyroid nodule. No further imaging necessary (ref: J Am Coll Radiol. 2015 Feb;12(2): 143-50). Chest CT reported separately from today Disc levels: Mild thoracic disc degeneration. Central calcified disc protrusion at T6-7 with mild spinal stenosis. No other levels of stenosis identified. CT LUMBAR SPINE FINDINGS Segmentation: 5 lumbar vertebra.  Lowest disc space L5-S1. Alignment: Normal Vertebra: No fracture or mass lesion identified. Paraspinal and other soft tissues: CT abdomen pelvis reported separately from today. Disc spaces: Disc spaces maintained without significant degenerative change. No disc protrusion or stenosis. IMPRESSION: 1. CT thoracic spine   negative for fracture. 2. Ill-defined hypodensities in the vertebral bodies of C7, T1, and T2 of undetermined etiology. Consider MRI thoracic spine without and with contrast for further evaluation 3. Central calcified disc protrusion T6-7 causing mild spinal stenosis. 4. 6 mm left thyroid nodule. No further imaging necessary. 5.  Negative CT lumbar spine. Electronically Signed   By: Franchot Gallo M.D.   On: 09/13/2022 18:22   CT T-SPINE NO CHARGE  Result Date: 09/13/2022 CLINICAL DATA:  Metastatic disease evaluation. Lymphoma in remission with elevated alkaline phosphatase. EXAM: CT THORACIC AND LUMBAR SPINE WITHOUT AND WITH CONTRAST TECHNIQUE: Multidetector CT imaging of the thoracic and lumbar spine was performed without and with contrast. Multiplanar CT image reconstructions were also generated. RADIATION DOSE REDUCTION: This exam was performed according to the departmental dose-optimization program which includes automated exposure control, adjustment of the mA and/or kV according to patient size and/or use of iterative reconstruction technique. COMPARISON:  None Available. CONTRAST:  100 mL Omnipaque 300 IV FINDINGS: CT THORACIC SPINE FINDINGS Alignment: Normal Vertebrae: Negative for fracture Ill-defined hypodensities in the vertebral bodies of C7, T1, T2 of undetermined etiology. Paraspinal and other soft tissues: 6 mm left thyroid nodule. No further imaging necessary (ref: J Am Coll Radiol. 2015 Feb;12(2): 143-50). Chest CT reported separately from today Disc levels:  Mild thoracic disc degeneration. Central calcified disc protrusion at T6-7 with mild spinal stenosis. No other levels of stenosis identified. CT LUMBAR SPINE FINDINGS Segmentation: 5 lumbar vertebra.  Lowest disc space L5-S1. Alignment: Normal Vertebra: No fracture or mass lesion identified. Paraspinal and other soft tissues: CT abdomen pelvis reported separately from today. Disc spaces: Disc spaces maintained without significant degenerative change. No disc protrusion or stenosis. IMPRESSION: 1. CT thoracic spine   negative for fracture. 2. Ill-defined hypodensities in the vertebral bodies of C7, T1, and T2 of undetermined etiology. Consider MRI thoracic spine without and with contrast for further evaluation 3. Central calcified disc protrusion T6-7 causing mild  spinal stenosis. 4. 6 mm left thyroid nodule. No further imaging necessary. 5. Negative CT lumbar spine. Electronically Signed   By: Franchot Gallo M.D.   On: 09/13/2022 18:22     Procedures Procedures   Medications Ordered in ED Medications  lactated ringers bolus 1,000 mL (1,000 mLs Intravenous New Bag/Given 09/13/22 1712)  acetaminophen (TYLENOL) tablet 650 mg (650 mg Oral Given 09/13/22 1707)  iohexol (OMNIPAQUE) 300 MG/ML solution 100 mL (100 mLs Intravenous Contrast Given 09/13/22 1719)    ED Course/ Medical Decision Making/ A&P                           Medical Decision Making Amount and/or Complexity of Data Reviewed Labs: ordered. Radiology: ordered.  Risk OTC drugs. Prescription drug management.   46 year old female presents emerged department for evaluation of body aches and back pain.  Differential diagnosis includes but is not limited to malignancy versus musculoskeletal pain versus cauda equina versus epidural abscess.  Vital signs show normotensive, tachycardic, satting well on room air without any increased work of breathing.  Initial temperature was elevated, asked staff to perform a rectal temperature which was 102.2.  Given the patient's slightly elevated heart rate as well as a fever in the setting of immune compromise, order lactic acid and blood cultures.  Physical exam as noted above.  Lab and imaging ordered as well after discussion with my attending.  I independently reviewed and interpreted the patient's labs.  Lactic acid within normal limits.  Urinalysis shows slightly increased pH at 8.5 otherwise unremarkable.  Pregnancy test is negative.  Negative for COVID and flu.  CMP shows mildly decreased sodium at 134, mildly increased glucose at 112, alk phos significantly elevated at 476.  Otherwise, no electrolyte or LFT abnormality.  Lipase within normal limits.  CBC without cytosis or anemia.  CT imaging shows 1. CT thoracic spine   negative for fracture. 2.  Ill-defined hypodensities in the vertebral bodies of C7, T1, and T2 of undetermined etiology. Consider MRI thoracic spine without and with contrast for further evaluation 3. Central calcified disc protrusion T6-7 causing mild spinal stenosis. 4. 6 mm left thyroid nodule. No further imaging necessary. 5. Negative CT lumbar spine.  1. No evidence of lymphoma recurrence on CT of the chest abdomen pelvis. 2. Irregular shaped spleen not changed from prior.  No splenomegaly. 3. Lobular uterus most consistent benign leiomyoma. 4. Cutaneous thickening in the lower abdominal wall.  Patient does have some ill-defined hypodensities in the vertebral bodies of C7, T1, and T2 to of undetermined etiology.  I do not have the patient needs any emergent MRI at this time, we will have her follow-up with her PCP/oncologist for evaluation of these findings.  Blood cultures are still pending, lactic acid normal.  Patient has improved with  fluids.  She is no longer tachycardic.  Her fever has decreased after some Tylenol as well.  Unsure of the etiology of her elevated alk phos, question dehydration, will have her follow-up with her PCP otherwise given normal hepatobiliary CT findings.  On reevaluation, patient reports that she is feeling better after the Tylenol and rehydration.  Patient is likely experiencing a viral illness with the fever and body aches.  We will have her follow-up closely with her primary care office for reevaluation of the symptoms and further Luking of her abnormal imaging findings.  It continues as a bedside.  Her precautions given.  Patient is stable being discharged home in good condition.   I discussed this case with my attending physician who cosigned this note including patient's presenting symptoms, physical exam, and planned diagnostics and interventions. Attending physician stated agreement with plan or made changes to plan which were implemented.   Attending physician assessed patient at  bedside.  Final Clinical Impression(s) / ED Diagnoses Final diagnoses:  Fever, unspecified fever cause  Acute bilateral thoracic back pain  Abnormal finding on imaging    Rx / DC Orders ED Discharge Orders     None         Sherrell Puller, PA-C 09/16/22 [place], DO 09/17/22 0700

## 2022-09-13 NOTE — ED Notes (Signed)
ED Provider at bedside. 

## 2022-09-18 LAB — CULTURE, BLOOD (ROUTINE X 2)
Culture: NO GROWTH
Culture: NO GROWTH
Special Requests: ADEQUATE
Special Requests: ADEQUATE

## 2022-10-02 ENCOUNTER — Ambulatory Visit: Payer: BC Managed Care – PPO | Admitting: Physician Assistant

## 2022-10-02 DIAGNOSIS — L910 Hypertrophic scar: Secondary | ICD-10-CM | POA: Diagnosis not present

## 2022-10-02 DIAGNOSIS — Z719 Counseling, unspecified: Secondary | ICD-10-CM

## 2022-10-02 NOTE — Progress Notes (Signed)
  This is a 46 year old female seen in our office for follow-up evaluation and keloid injection.  The patient has a history lymphoma that was treated 2 years ago, she had a Port-A-Cath placed at that time.  She developed keloiding along the anterior chest wall.  She was last seen in the office on 07/31/2022 and had Kenalog mixed with epinephrine injected at that time.  She notes that the scar did soften but after several weeks started to become firm again.  She is here today for repeat injection.   Procedure Note   Preoperative Dx: Keloid of chest   Postoperative Dx: Same   Procedure: 1.5 x 10 cm keloid of chest   Anesthesia: Lidocaine 1% with 1:100,000 epinephrine     Description of Procedure: Risks and complications were explained to the patient.  Consent was confirmed and the patient understands the risks and benefits.  The potential complications and alternatives were explained and the patient consents.  The patient expressed understanding the option of not having the procedure and the risks of a scar.  Time out was called and all information was confirmed to be correct.     The area was prepped and drapped.  Lidocaine 1% with epinepherine 0.1 cc was mixed with Kenalog 50/5 0.4 cc.  The mixture was injected into the 10 cm chest keloid. Brittlyn tolerated the procedure well and there were no complications.

## 2022-10-09 ENCOUNTER — Ambulatory Visit: Payer: BC Managed Care – PPO | Admitting: Plastic Surgery

## 2022-11-01 ENCOUNTER — Ambulatory Visit (INDEPENDENT_AMBULATORY_CARE_PROVIDER_SITE_OTHER): Payer: BC Managed Care – PPO | Admitting: Student

## 2022-11-01 DIAGNOSIS — L910 Hypertrophic scar: Secondary | ICD-10-CM

## 2022-11-01 NOTE — Progress Notes (Signed)
Patient is a 46 year old female with history of keloid to the right side of her chest.  She presents to the clinic today for possible Kenalog injection to the keloid.  Patient most recently had a Kenalog injection mixed with epinephrine on 10/02/2022.   Today, patient reports she is doing well.  She states that she felt the Kenalog injection helped soften up her keloid last time and helped it strangle little bit, but after about 2 to 3 weeks she noticed the keloid became itchy.  She states that she is interested in getting another keloid injection today.  Preoperative Dx: Keloid to the chest  Postoperative Dx: Same  Procedure: Kenalog injection to 10 cm keloid to the chest  Provider: Donnamarie Rossetti, PA-C  Anesthesia: Lidocaine 1% with 1:100,000 epinepherine  Description of Procedure: Risks and complications were explained to the patient.  Consent was confirmed and in the patient understands the risks and benefits.  Time out was called and all information was confirmed to be correct.    The area was cleaned with alcohol.  Lidocaine 1% with epinephrine 0.1 cc was mixed with Kenalog 50 per 5.4 cc.  The mixture was injected into the 10 cm chest keloid.  Patient tolerated the procedure well.  There were no complications.  I discussed with the patient that she should continue to massage the keloid and apply silicone tapes to the area.  I discussed with the patient that she should follow-up in 1 month with Dr. Marla Roe as this was her third Kenalog injection to evaluate next steps.  Patient expressed understanding.

## 2022-12-14 ENCOUNTER — Encounter: Payer: Self-pay | Admitting: Plastic Surgery

## 2022-12-14 ENCOUNTER — Ambulatory Visit: Payer: BC Managed Care – PPO | Admitting: Plastic Surgery

## 2022-12-14 DIAGNOSIS — L910 Hypertrophic scar: Secondary | ICD-10-CM | POA: Diagnosis not present

## 2022-12-14 NOTE — Progress Notes (Signed)
Procedure Note  Preoperative Dx: Keloid of chest  Postoperative Dx: Same  Procedure: Kenalog injection to keloid of chest 8 cm  Indication for Procedure: Keloid  Description of Procedure: Risks and complications were explained to the patient.  Consent was confirmed and the patient understands the risks and benefits.  The potential complications and alternatives were explained and the patient consents.  The patient expressed understanding the option of not having the procedure and the risks of a scar.  Time out was called and all information was confirmed to be correct.    The area was prepped and drapped.  Lidocaine 1% with epinephrine 0.1 cc was mixed with kenalog 50/5 mg 0.2 cc.  The mixture was injected in the lesion.  A dressing was applied.  The patient was given instructions on how to care for the area and a follow up appointment.  Heather [name] tolerated the procedure well and there were no complications.

## 2022-12-17 ENCOUNTER — Ambulatory Visit: Payer: BC Managed Care – PPO

## 2023-01-07 ENCOUNTER — Other Ambulatory Visit: Payer: Self-pay

## 2023-01-07 DIAGNOSIS — C8338 Diffuse large B-cell lymphoma, lymph nodes of multiple sites: Secondary | ICD-10-CM

## 2023-01-08 ENCOUNTER — Inpatient Hospital Stay: Payer: BC Managed Care – PPO

## 2023-01-08 ENCOUNTER — Inpatient Hospital Stay: Payer: BC Managed Care – PPO | Attending: Hematology | Admitting: Hematology

## 2023-01-08 NOTE — Progress Notes (Shared)
HEMATOLOGY/ONCOLOGY CLINIC NOTE  Date of Service: 01/08/2023  Heather [name] Care Team: [name], MD as PCP - General (Internal Medicine)  CHIEF COMPLAINTS/PURPOSE OF CONSULTATION:  Follow-up for continued evaluation and management of large B-cell lymphoma  HISTORY OF PRESENTING ILLNESS:  Please see previous notes for details on initial presentation.  INTERVAL HISTORY: Heather [name] is here for continued evaluation and management of large B-cell lymphoma.   Heather [name] was last seen by me on 07/07/2022 and she was doing well overall.     MEDICAL HISTORY:  Past Medical History:  Diagnosis Date   Anemia    Diffuse large B-cell lymphoma of lymph nodes of multiple regions ([place]) 04/12/2020   Hep B w/o coma, chronic, w/o delta ([facility])    History of blood transfusion    childhood   HIV (human immunodeficiency virus infection) ([place])    Immune deficiency disorder ([place])     SURGICAL HISTORY: Past Surgical History:  Procedure Laterality Date   CHROMOPERTUBATION Bilateral 11/29/2016   Procedure: CHROMOPERTUBATION;  Surgeon: Waymon Amato, MD;  Location: [facility];  Service: Gynecology;  Laterality: Bilateral;  fallopian tubes   ENDOMETRIAL BIOPSY     IR IMAGING GUIDED PORT INSERTION  04/15/2020   IR REMOVAL TUN ACCESS W/ PORT W/O FL MOD SED  10/02/2021   IR THORACENTESIS ASP PLEURAL SPACE W/IMG GUIDE  04/15/2020   MYOMECTOMY N/A 11/29/2016   Procedure: MYOMECTOMY;  Surgeon: Waymon Amato, MD;  Location: [facility];  Service: Gynecology;  Laterality: N/A;    SOCIAL HISTORY: Social History   Socioeconomic History   Marital status: Married    Spouse name: Not on file   Number of children: Not on file   Years of education: Not on file   Highest education level: Not on file  Occupational History   Not on file  Tobacco Use   Smoking status: Never   Smokeless tobacco: Never  Substance and Sexual Activity   Alcohol use: Yes    Comment: occ   Drug use: No   Sexual activity: Yes  Other Topics  Concern   Not on file  Social History Narrative   Not on file   Social Determinants of Health   Financial Resource Strain: Not on file  Food Insecurity: Not on file  Transportation Needs: Not on file  Physical Activity: Not on file  Stress: Not on file  Social Connections: Not on file  Intimate Partner Violence: Not on file    FAMILY HISTORY: Family History  Problem Relation Age of Onset   Diabetes Mother    Hypertension Mother     ALLERGIES:  is allergic to benadryl allergy [diphenhydramine hcl].  MEDICATIONS:  Current Outpatient Medications  Medication Sig Dispense Refill   acetaminophen (TYLENOL) 500 MG tablet Take 500 mg by mouth every 6 (six) hours as needed for mild pain or headache.     acyclovir (ZOVIRAX) 400 MG tablet TAKE 1 TABLET BY MOUTH TWICE A DAY 180 tablet 2   B Complex Vitamins (B COMPLEX PO) Take 1 tablet by mouth daily.     bictegravir-emtricitabine-tenofovir AF (BIKTARVY) 50-200-25 MG TABS tablet Take 1 tablet by mouth daily with breakfast.      cholecalciferol (VITAMIN D3) 25 MCG (1000 UNIT) tablet Take 1 tablet (1,000 Units total) by mouth at bedtime.     feeding supplement, ENSURE ENLIVE, (ENSURE ENLIVE) LIQD Take 237 mLs by mouth 2 (two) times daily between meals. 237 mL 12   ibuprofen (ADVIL) 200 MG tablet Take 2  tablets (400 mg total) by mouth every 6 (six) hours as needed for mild pain. 30 tablet 0   lidocaine-prilocaine (EMLA) cream Apply to affected area once 30 g 3   Multiple Vitamin (MULTIVITAMIN WITH MINERALS) TABS tablet Take 1 tablet by mouth at bedtime.      ondansetron (ZOFRAN) 8 MG tablet Take 1 tablet (8 mg total) by mouth every 8 (eight) hours as needed for nausea or vomiting. 30 tablet 1   polyethylene glycol (MIRALAX / GLYCOLAX) 17 g packet Take 17 g by mouth daily as needed for mild constipation. 14 each 0   senna-docusate (SENOKOT-S) 8.6-50 MG tablet Take 2 tablets by mouth at bedtime as needed for mild constipation.     No current  facility-administered medications for this visit.    REVIEW OF SYSTEMS:   10 Point review of Systems was done is negative except as noted above.   PHYSICAL EXAMINATION: ECOG PERFORMANCE STATUS: 1 - Symptomatic but completely ambulatory  . There were no vitals filed for this visit.  There were no vitals filed for this visit.  .There is no height or weight on file to calculate BMI.  NAD GENERAL:alert, in no acute distress and comfortable SKIN: no acute rashes, no significant lesions EYES: conjunctiva are pink and non-injected, sclera anicteric NECK: supple, no JVD LYMPH:  no palpable lymphadenopathy in the cervical, axillary or inguinal regions LUNGS: clear to auscultation b/l with normal respiratory effort HEART: regular rate & rhythm ABDOMEN:  normoactive bowel sounds , non tender, not distended. Extremity: no pedal edema PSYCH: alert & oriented x 3 with fluent speech NEURO: no focal motor/sensory deficits  LABORATORY DATA:  I have reviewed the data as listed  .    Latest Ref Rng & Units 09/13/2022    2:57 PM 07/10/2022    1:57 PM 03/06/2022   11:11 AM  CBC  WBC 4.0 - 10.5 K/uL 7.8  5.5  4.4   Hemoglobin 12.0 - 15.0 g/dL 13.5  13.1  12.6   Hematocrit 36.0 - 46.0 % 41.1  40.0  37.4   Platelets 150 - 400 K/uL 278  244  232     .    Latest Ref Rng & Units 09/13/2022    2:57 PM 07/10/2022    1:57 PM 03/06/2022   11:11 AM  CMP  Glucose 70 - 99 mg/dL 112  78  144   BUN 6 - 20 mg/dL '9  14  13   '$ Creatinine 0.44 - 1.00 mg/dL 0.85  0.80  0.86   Sodium 135 - 145 mmol/L 134  140  138   Potassium 3.5 - 5.1 mmol/L 3.7  3.8  3.9   Chloride 98 - 111 mmol/L 100  104  103   CO2 22 - 32 mmol/L 25  31  32   Calcium 8.9 - 10.3 mg/dL 9.0  9.9  9.5   Total Protein 6.5 - 8.1 g/dL 7.7  7.4  6.7   Total Bilirubin 0.3 - 1.2 mg/dL 0.6  0.3  0.3   Alkaline Phos 38 - 126 U/L 476  88  80   AST 15 - 41 U/L '27  19  16   '$ ALT 0 - 44 U/L '27  19  18    '$ . Lab Results  Component Value Date   LDH  202 (H) 07/10/2022     04/08/2020 Pancreatic Mass Surgical Pathology Report [id number]):    RADIOGRAPHIC STUDIES: I have personally reviewed the radiological images as listed and  agreed with the findings in the report. No results found.  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 No internal necrosis within the mass noted. Diffuse splenomegaly. Left periaortic lymph node causing mild displacement of the third portion of the duodenum.   HIV could be risk factors for high grade EBV driven lymphomas   #2 history of HIV/AIDS last VL undetectable follows with Dr. Melynda Keller and at [facility].   #3 h/o Hepatitis B - last VL undetectable #4 tendency to have keloids Receiving intralesional steroid treatment for significant keloid at the Port-A-Cath site.  PLAN: -Discussed Heather [name]'s labs from today showing stable CBC, stable CMP and LDH within normal limits.. -Heather [name] has no clinical signs or symptoms of lymphoma progression at this time. -No indication for further treatment of the Heather [name]'s lymphoma at this time which continues to remain in remission. -She will continue to follow-up with her infectious disease doctor at [facility] for continued management of her HIV and hepatitis B. -Heather [name] is in good spirits and has been doing well. -We will be seeing her every 4 months for the first 2 years and then every 6 months for 2 to 3 years followed by yearly follow-ups. -She is scheduled for intralesional steroid treatment with Dr. Marla Roe on 07/25/2022.   FOLLOW-UP: ***  The total time spent in the appointment was *** minutes* .  All of the Heather [name]'s questions were  answered with apparent satisfaction. The Heather [name] knows to call the clinic with any problems, questions or concerns.   Sullivan Lone MD MS AAHIVMS [facility] Hematology/Oncology Physician [facility]  .*Total Encounter Time as defined by the Centers for Medicare and Medicaid Services includes, in addition to the face-to-face time of a Heather [name] visit (documented in the note above) non-face-to-face time: obtaining and reviewing outside history, ordering and reviewing medications, tests or procedures, care coordination (communications with other health care professionals or caregivers) and documentation in the medical record.   Heather [name], am acting as a Education administrator for Sullivan Lone, MD.

## 2023-01-09 ENCOUNTER — Telehealth: Payer: Self-pay | Admitting: Internal Medicine

## 2023-01-09 NOTE — Telephone Encounter (Signed)
Per 3/5 IB reached out to patient to reschedule missed appointments, left voicemail for patient.

## 2023-02-22 ENCOUNTER — Encounter: Payer: Self-pay | Admitting: Plastic Surgery

## 2023-02-22 ENCOUNTER — Ambulatory Visit: Payer: BC Managed Care – PPO | Admitting: Plastic Surgery

## 2023-02-25 NOTE — Telephone Encounter (Signed)
I spoke with her today and reinforced that we are here to help her in any way. I discussed excision, she is hesitant and would like to wait but knows she can reach out at any time.

## 2023-03-01 ENCOUNTER — Inpatient Hospital Stay: Payer: BC Managed Care – PPO | Attending: Hematology

## 2023-03-01 ENCOUNTER — Inpatient Hospital Stay (HOSPITAL_BASED_OUTPATIENT_CLINIC_OR_DEPARTMENT_OTHER): Payer: BC Managed Care – PPO | Admitting: Hematology

## 2023-03-01 VITALS — BP 132/86 | HR 82 | Temp 98.0°F | Resp 20 | Wt 172.2 lb

## 2023-03-01 DIAGNOSIS — C8338 Diffuse large B-cell lymphoma, lymph nodes of multiple sites: Secondary | ICD-10-CM

## 2023-03-01 DIAGNOSIS — C8513 Unspecified B-cell lymphoma, intra-abdominal lymph nodes: Secondary | ICD-10-CM | POA: Insufficient documentation

## 2023-03-01 LAB — CMP (CANCER CENTER ONLY)
ALT: 18 U/L (ref 0–44)
AST: 19 U/L (ref 15–41)
Albumin: 4.4 g/dL (ref 3.5–5.0)
Alkaline Phosphatase: 79 U/L (ref 38–126)
Anion gap: 5 (ref 5–15)
BUN: 15 mg/dL (ref 6–20)
CO2: 32 mmol/L (ref 22–32)
Calcium: 10.3 mg/dL (ref 8.9–10.3)
Chloride: 104 mmol/L (ref 98–111)
Creatinine: 0.87 mg/dL (ref 0.44–1.00)
GFR, Estimated: >60 mL/min (ref >60)
Glucose, Bld: 88 mg/dL (ref 70–99)
Potassium: 3.9 mmol/L (ref 3.5–5.1)
Sodium: 141 mmol/L (ref 135–145)
Total Bilirubin: 0.4 mg/dL (ref 0.3–1.2)
Total Protein: 7.3 g/dL (ref 6.5–8.1)

## 2023-03-01 LAB — CBC WITH DIFFERENTIAL (CANCER CENTER ONLY)
Abs Immature Granulocytes: 0.02 10*3/uL (ref 0.00–0.07)
Basophils Absolute: 0 10*3/uL (ref 0.0–0.1)
Basophils Relative: 1 %
Eosinophils Absolute: 0.1 10*3/uL (ref 0.0–0.5)
Eosinophils Relative: 2 %
HCT: 39.6 % (ref 36.0–46.0)
Hemoglobin: 13.2 g/dL (ref 12.0–15.0)
Immature Granulocytes: 0 %
Lymphocytes Relative: 44 %
Lymphs Abs: 2 10*3/uL (ref 0.7–4.0)
MCH: 27.6 pg (ref 26.0–34.0)
MCHC: 33.3 g/dL (ref 30.0–36.0)
MCV: 82.8 fL (ref 80.0–100.0)
Monocytes Absolute: 0.4 10*3/uL (ref 0.1–1.0)
Monocytes Relative: 9 %
Neutro Abs: 2 10*3/uL (ref 1.7–7.7)
Neutrophils Relative %: 44 %
Platelet Count: 226 10*3/uL (ref 150–400)
RBC: 4.78 MIL/uL (ref 3.87–5.11)
RDW: 16.1 % — ABNORMAL HIGH (ref 11.5–15.5)
WBC Count: 4.6 10*3/uL (ref 4.0–10.5)
nRBC: 0 % (ref 0.0–0.2)

## 2023-03-01 LAB — LACTATE DEHYDROGENASE: LDH: 213 U/L — ABNORMAL HIGH (ref 98–192)

## 2023-03-01 NOTE — Progress Notes (Signed)
HEMATOLOGY/ONCOLOGY CLINIC NOTE  Date of Service: 03/01/2023  Patient Care Team: Cira Servant, MD as PCP - General (Internal Medicine)  CHIEF COMPLAINTS/PURPOSE OF CONSULTATION:  Follow-up for continued evaluation and management of large B-cell lymphoma  HISTORY OF PRESENTING ILLNESS:  Please see previous notes for details on initial presentation.  INTERVAL HISTORY:  Heather [name] is a 47 y.o. female here for continued evaluation and management of large B-cell lymphoma. Patient was last seen by me on 07/10/2022 and was doing well overall with no new medical concerns.   Today, she reports that she has been feeling well overall and is planning to travel to [place] in June 2024. She denies any new medication changes, abdominal pain, change in bowel habits, or leg swelling. Patient has normal p.o intake.  She reports that she did complete a bone density scan which revealed osteopenia. She does take vitamin D supplements generally but notes that she has missed doses.  She reports that one month ago she endorsed sharp pain in her central chest. She noted stabbing pain with walking. She did endorse mild pain on palpation. This has since resolved.  She reports that steroid injections did not improve keloid symptoms. She notes that keloid is increasing in size.   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)    History of blood transfusion    childhood   HIV (human immunodeficiency virus infection) ([facility])    Immune deficiency disorder ([facility])     SURGICAL HISTORY: Past Surgical History:  Procedure Laterality Date   CHROMOPERTUBATION Bilateral 11/29/2016   Procedure: CHROMOPERTUBATION;  Surgeon: Hoover Browns, MD;  Location: [facility];  Service: Gynecology;  Laterality: Bilateral;  fallopian tubes   ENDOMETRIAL BIOPSY     IR IMAGING GUIDED  PORT INSERTION  04/15/2020   IR REMOVAL TUN ACCESS W/ PORT W/O FL MOD SED  10/02/2021   IR THORACENTESIS ASP PLEURAL SPACE W/IMG GUIDE  04/15/2020   MYOMECTOMY N/A 11/29/2016   Procedure: MYOMECTOMY;  Surgeon: Hoover Browns, MD;  Location: [facility];  Service: Gynecology;  Laterality: N/A;    SOCIAL HISTORY: Social History   Socioeconomic History   Marital status: Married    Spouse name: Not on file   Number of children: Not on file   Years of education: Not on file   Highest education level: Not on file  Occupational History   Not on file  Tobacco Use   Smoking status: Never   Smokeless tobacco: Never  Substance and Sexual Activity   Alcohol use: Yes    Comment: occ   Drug use: No   Sexual activity: Yes  Other Topics Concern   Not on file  Social History Narrative   Not on file   Social Determinants of Health   Financial Resource Strain: Not on file  Food Insecurity: Not on file  Transportation Needs: Not on file  Physical Activity: Not on file  Stress: Not on file  Social Connections: Not on file  Intimate Partner Violence: Not on file    FAMILY HISTORY: Family History  Problem Relation Age of Onset   Diabetes Mother    Hypertension Mother     ALLERGIES:  is allergic to benadryl allergy [diphenhydramine hcl].  MEDICATIONS:  Current Outpatient Medications  Medication Sig Dispense Refill   acetaminophen (TYLENOL) 500 MG tablet  Take 500 mg by mouth every 6 (six) hours as needed for mild pain or headache.     acyclovir (ZOVIRAX) 400 MG tablet TAKE 1 TABLET BY MOUTH TWICE A DAY 180 tablet 2   B Complex Vitamins (B COMPLEX PO) Take 1 tablet by mouth daily.     bictegravir-emtricitabine-tenofovir AF (BIKTARVY) 50-200-25 MG TABS tablet Take 1 tablet by mouth daily with breakfast.      cholecalciferol (VITAMIN D3) 25 MCG (1000 UNIT) tablet Take 1 tablet (1,000 Units total) by mouth at bedtime.     feeding supplement, ENSURE ENLIVE, (ENSURE ENLIVE) LIQD Take 237 mLs by mouth  2 (two) times daily between meals. 237 mL 12   ibuprofen (ADVIL) 200 MG tablet Take 2 tablets (400 mg total) by mouth every 6 (six) hours as needed for mild pain. 30 tablet 0   lidocaine-prilocaine (EMLA) cream Apply to affected area once 30 g 3   Multiple Vitamin (MULTIVITAMIN WITH MINERALS) TABS tablet Take 1 tablet by mouth at bedtime.      ondansetron (ZOFRAN) 8 MG tablet Take 1 tablet (8 mg total) by mouth every 8 (eight) hours as needed for nausea or vomiting. 30 tablet 1   polyethylene glycol (MIRALAX / GLYCOLAX) 17 g packet Take 17 g by mouth daily as needed for mild constipation. 14 each 0   senna-docusate (SENOKOT-S) 8.6-50 MG tablet Take 2 tablets by mouth at bedtime as needed for mild constipation.     No current facility-administered medications for this visit.    REVIEW OF SYSTEMS:    10 Point review of Systems was done is negative except as noted above.   PHYSICAL EXAMINATION: ECOG PERFORMANCE STATUS: 1 - Symptomatic but completely ambulatory  . Vitals:   03/01/23 1425  BP: 132/86  Pulse: 82  Resp: 20  Temp: 98 F (36.7 C)  SpO2: 100%    Filed Weights   03/01/23 1425  Weight: 172 lb 3.2 oz (78.1 kg)    .Body mass index is 33.63 kg/m.   GENERAL:alert, in no acute distress and comfortable SKIN: no acute rashes, no significant lesions EYES: conjunctiva are pink and non-injected, sclera anicteric OROPHARYNX: MMM, no exudates, no oropharyngeal erythema or ulceration NECK: supple, no JVD LYMPH:  no palpable lymphadenopathy in the cervical, axillary or inguinal regions LUNGS: clear to auscultation b/l with normal respiratory effort HEART: regular rate & rhythm ABDOMEN:  normoactive bowel sounds , non tender, not distended. Extremity: no pedal edema PSYCH: alert & oriented x 3 with fluent speech NEURO: no focal motor/sensory deficits   LABORATORY DATA:  I have reviewed the data as listed  .    Latest Ref Rng & Units 03/01/2023    1:44 PM 09/13/2022     2:57 PM 07/10/2022    1:57 PM  CBC  WBC 4.0 - 10.5 K/uL 4.6  7.8  5.5   Hemoglobin 12.0 - 15.0 g/dL 16.1  09.6  04.5   Hematocrit 36.0 - 46.0 % 39.6  41.1  40.0   Platelets 150 - 400 K/uL 226  278  244     .    Latest Ref Rng & Units 03/01/2023    1:44 PM 09/13/2022    2:57 PM 07/10/2022    1:57 PM  CMP  Glucose 70 - 99 mg/dL 88  409  78   BUN 6 - 20 mg/dL 15  9  14    Creatinine 0.44 - 1.00 mg/dL 8.11  9.14  7.82   Sodium 135 - 145  mmol/L 141  134  140   Potassium 3.5 - 5.1 mmol/L 3.9  3.7  3.8   Chloride 98 - 111 mmol/L 104  100  104   CO2 22 - 32 mmol/L 32  25  31   Calcium 8.9 - 10.3 mg/dL 81.1  9.0  9.9   Total Protein 6.5 - 8.1 g/dL 7.3  7.7  7.4   Total Bilirubin 0.3 - 1.2 mg/dL 0.4  0.6  0.3   Alkaline Phos 38 - 126 U/L 79  476  88   AST 15 - 41 U/L 19  27  19    ALT 0 - 44 U/L 18  27  19     . Lab Results  Component Value Date   LDH 213 (H) 03/01/2023     04/08/2020 Pancreatic Mass Surgical Pathology Report [id number]):    RADIOGRAPHIC STUDIES: I have personally reviewed the radiological images as listed and agreed with the findings in the report. No results found.  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 No internal necrosis within the mass noted. Diffuse splenomegaly. Left periaortic lymph node causing mild displacement of the third portion of the duodenum.   HIV could be risk factors for high grade EBV driven lymphomas   #2 history of HIV/AIDS last VL undetectable follows with Dr. Madlyn Frankel and at [facility].   #3 h/o Hepatitis B - last VL undetectable  PLAN:  -2.5 years since patient's last received treatment  -Patient did receive  intralesional steroid treatment for keloid with Dr. Ulice Bold on 07/25/2022 -Discussed lab results on 03/01/2023 in detail with patient. CBC normal  showed WBC of 4.6K, hemoglobin of 13.2, and platelets of 226K -CMP stable -discussed CT chest abdm/pelvis 09/13/2022 which revealed normal results -Discussed 02/28/2023 dexa scan which revealed osteopenia -LDH borderline, no significant change -Patient has no clinical signs or symptoms of lymphoma progression at this time. -No indication for further treatment of the patient's lymphoma at this time which continues to remain in remission. -She will continue to follow-up with her infectious disease doctor at [facility] for continued management of her HIV and hepatitis B. -continue to stay active with regular exercise and recommend strength training -Recommend applying OTC silicone dressing to improve keloid  -Patient is UTD with her vaccinations including pneumonia, COVID-19 booster, and influenza  #4 tendency to have keloids Receiving intralesional steroid treatment for significant keloid at the Port-A-Cath site.  FOLLOW -UP: RTC with Dr Candise Che with labs in 6 months  The total time spent in the appointment was 20 minutes* .  All of the patient's questions were answered with apparent satisfaction. The patient knows to call the clinic with any problems, questions or concerns.   Wyvonnia Lora MD MS AAHIVMS [facility] Hematology/Oncology Physician [facility]  .*Total Encounter Time as defined by the Centers for Medicare and Medicaid Services includes, in addition to the face-to-face time of a patient visit (documented in the note above) non-face-to-face time: obtaining and reviewing outside history, ordering and reviewing medications, tests or procedures, care coordination (communications with other health care professionals or caregivers) and documentation in the medical record.    I,Heather [name],acting as a Neurosurgeon for Wyvonnia Lora, MD.,have documented  all relevant documentation on the behalf of Wyvonnia Lora, MD,as directed by  Wyvonnia Lora, MD while in the presence of Wyvonnia Lora, MD.  .I have reviewed the above documentation for accuracy and completeness, and I agree with the above. Heather Maine MD

## 2023-03-04 ENCOUNTER — Telehealth: Payer: Self-pay | Admitting: Hematology

## 2023-08-17 ENCOUNTER — Other Ambulatory Visit: Payer: Self-pay

## 2023-08-17 DIAGNOSIS — C8338 Diffuse large B-cell lymphoma, lymph nodes of multiple sites: Secondary | ICD-10-CM

## 2023-08-19 ENCOUNTER — Inpatient Hospital Stay: Payer: BC Managed Care – PPO | Attending: Hematology

## 2023-08-19 ENCOUNTER — Inpatient Hospital Stay: Payer: BC Managed Care – PPO | Admitting: Hematology

## 2023-08-28 ENCOUNTER — Telehealth: Payer: Self-pay | Admitting: Hematology

## 2023-08-28 NOTE — Telephone Encounter (Signed)
Left patient a vm regarding rescheduled appointment

## 2023-09-30 ENCOUNTER — Inpatient Hospital Stay: Payer: BC Managed Care – PPO | Admitting: Hematology

## 2023-09-30 ENCOUNTER — Inpatient Hospital Stay: Payer: BC Managed Care – PPO | Attending: Hematology

## 2023-09-30 NOTE — Progress Notes (Shared)
HEMATOLOGY/ONCOLOGY CLINIC NOTE  Date of Service: 09/30/2023  Patient Care Team: Cira Servant, MD as PCP - General (Internal Medicine)  CHIEF COMPLAINTS/PURPOSE OF CONSULTATION:  Follow-up for continued evaluation and management of large B-cell lymphoma  HISTORY OF PRESENTING ILLNESS:  Please see previous notes for details on initial presentation.  INTERVAL HISTORY:  [name] is a 47 y.o. female here for continued evaluation and management of large B-cell lymphoma.   Patient was last seen by me on 03/01/2023 and she reported one incident of sharp central chest pain and stabbing pain with walking. She reported that her keloid increased in size.     -Discussed lab results from today, 09/30/2023, in detail with the patient.   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)    History of blood transfusion    childhood   HIV (human immunodeficiency virus infection) (HCC)    Immune deficiency disorder (HCC)     SURGICAL HISTORY: Past Surgical History:  Procedure Laterality Date   CHROMOPERTUBATION Bilateral 11/29/2016   Procedure: CHROMOPERTUBATION;  Surgeon: Hoover Browns, MD;  Location: [facility];  Service: Gynecology;  Laterality: Bilateral;  fallopian tubes   ENDOMETRIAL BIOPSY     IR IMAGING GUIDED PORT INSERTION  04/15/2020   IR REMOVAL TUN ACCESS W/ PORT W/O FL MOD SED  10/02/2021   IR THORACENTESIS ASP PLEURAL SPACE W/IMG GUIDE  04/15/2020   MYOMECTOMY N/A 11/29/2016   Procedure: MYOMECTOMY;  Surgeon: Hoover Browns, MD;  Location: [facility];  Service: Gynecology;  Laterality: N/A;    SOCIAL HISTORY: Social History   Socioeconomic History   Marital status: Married    Spouse name: Not on file   Number of children: Not on file   Years of education: Not on file   Highest education level: Not on file  Occupational History    Not on file  Tobacco Use   Smoking status: Never   Smokeless tobacco: Never  Substance and Sexual Activity   Alcohol use: Yes    Comment: occ   Drug use: No   Sexual activity: Yes  Other Topics Concern   Not on file  Social History Narrative   Not on file   Social Determinants of Health   Financial Resource Strain: Not on file  Food Insecurity: Low Risk  (09/02/2023)   Received from [facility]   Hunger Vital Sign    Worried About Running Out of Food in the Last Year: Never true    Ran Out of Food in the Last Year: Never true  Transportation Needs: No Transportation Needs (09/02/2023)   Received from [facility]    In the past 12 months, has lack of reliable transportation kept you from medical appointments, meetings, work or from getting things needed for daily living? : No  Physical Activity: Not on file  Stress: Not on file  Social Connections: Not on file  Intimate Partner Violence: Not on file    FAMILY HISTORY: Family History  Problem Relation Age of Onset   Diabetes Mother    Hypertension Mother     ALLERGIES:  is allergic to benadryl allergy [diphenhydramine hcl].  MEDICATIONS:  Current Outpatient Medications  Medication Sig Dispense Refill   acetaminophen (TYLENOL) 500 MG tablet Take 500 mg by mouth every 6 (six) hours as needed  for mild pain or headache.     acyclovir (ZOVIRAX) 400 MG tablet TAKE 1 TABLET BY MOUTH TWICE A DAY 180 tablet 2   B Complex Vitamins (B COMPLEX PO) Take 1 tablet by mouth daily.     bictegravir-emtricitabine-tenofovir AF (BIKTARVY) 50-200-25 MG TABS tablet Take 1 tablet by mouth daily with breakfast.      cholecalciferol (VITAMIN D3) 25 MCG (1000 UNIT) tablet Take 1 tablet (1,000 Units total) by mouth at bedtime.     feeding supplement, ENSURE ENLIVE, (ENSURE ENLIVE) LIQD Take 237 mLs by mouth 2 (two) times daily between meals. 237 mL 12   ibuprofen (ADVIL) 200 MG tablet Take 2 tablets (400 mg total) by mouth  every 6 (six) hours as needed for mild pain. 30 tablet 0   lidocaine-prilocaine (EMLA) cream Apply to affected area once 30 g 3   Multiple Vitamin (MULTIVITAMIN WITH MINERALS) TABS tablet Take 1 tablet by mouth at bedtime.      ondansetron (ZOFRAN) 8 MG tablet Take 1 tablet (8 mg total) by mouth every 8 (eight) hours as needed for nausea or vomiting. 30 tablet 1   polyethylene glycol (MIRALAX / GLYCOLAX) 17 g packet Take 17 g by mouth daily as needed for mild constipation. 14 each 0   senna-docusate (SENOKOT-S) 8.6-50 MG tablet Take 2 tablets by mouth at bedtime as needed for mild constipation.     No current facility-administered medications for this visit.    REVIEW OF SYSTEMS:    10 Point review of Systems was done is negative except as noted above.   PHYSICAL EXAMINATION: ECOG PERFORMANCE STATUS: 1 - Symptomatic but completely ambulatory  . There were no vitals filed for this visit.   There were no vitals filed for this visit.   .There is no height or weight on file to calculate BMI.   GENERAL:alert, in no acute distress and comfortable SKIN: no acute rashes, no significant lesions EYES: conjunctiva are pink and non-injected, sclera anicteric OROPHARYNX: MMM, no exudates, no oropharyngeal erythema or ulceration NECK: supple, no JVD LYMPH:  no palpable lymphadenopathy in the cervical, axillary or inguinal regions LUNGS: clear to auscultation b/l with normal respiratory effort HEART: regular rate & rhythm ABDOMEN:  normoactive bowel sounds , non tender, not distended. Extremity: no pedal edema PSYCH: alert & oriented x 3 with fluent speech NEURO: no focal motor/sensory deficits   LABORATORY DATA:  I have reviewed the data as listed  .    Latest Ref Rng & Units 03/01/2023    1:44 PM 09/13/2022    2:57 PM 07/10/2022    1:57 PM  CBC  WBC 4.0 - 10.5 K/uL 4.6  7.8  5.5   Hemoglobin 12.0 - 15.0 g/dL 16.1  09.6  04.5   Hematocrit 36.0 - 46.0 % 39.6  41.1  40.0   Platelets  150 - 400 K/uL 226  278  244     .    Latest Ref Rng & Units 03/01/2023    1:44 PM 09/13/2022    2:57 PM 07/10/2022    1:57 PM  CMP  Glucose 70 - 99 mg/dL 88  409  78   BUN 6 - 20 mg/dL 15  9  14    Creatinine 0.44 - 1.00 mg/dL 8.11  9.14  7.82   Sodium 135 - 145 mmol/L 141  134  140   Potassium 3.5 - 5.1 mmol/L 3.9  3.7  3.8   Chloride 98 - 111 mmol/L 104  100  104   CO2 22 - 32 mmol/L 32  25  31   Calcium 8.9 - 10.3 mg/dL 86.5  9.0  9.9   Total Protein 6.5 - 8.1 g/dL 7.3  7.7  7.4   Total Bilirubin 0.3 - 1.2 mg/dL 0.4  0.6  0.3   Alkaline Phos 38 - 126 U/L 79  476  88   AST 15 - 41 U/L 19  27  19    ALT 0 - 44 U/L 18  27  19     . Lab Results  Component Value Date   LDH 213 (H) 03/01/2023     04/08/2020 Pancreatic Mass Surgical Pathology Report [id number]):    RADIOGRAPHIC STUDIES: I have personally reviewed the radiological images as listed and agreed with the findings in the report. No results found.  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 No internal necrosis within the mass noted. Diffuse splenomegaly. Left periaortic lymph node causing mild displacement of the third portion of the duodenum.   HIV could be risk factors for high grade EBV driven lymphomas   #2 history of HIV/AIDS last VL undetectable follows with Dr. Madlyn Frankel and at [facility].   #3 h/o Hepatitis B - last VL undetectable #4 tendency to have keloids Receiving intralesional steroid treatment for significant keloid at the Port-A-Cath site.   PLAN:  -2.5 years since patient's last received treatment  -Patient did receive intralesional steroid treatment for keloid with Dr. Ulice Bold on  07/25/2022 -Discussed lab results on 03/01/2023 in detail with patient. CBC normal  showed WBC of 4.6K, hemoglobin of 13.2, and platelets of 226K -CMP stable -discussed CT chest abdm/pelvis 09/13/2022 which revealed normal results -Discussed 02/28/2023 dexa scan which revealed osteopenia -LDH borderline, no significant change -Patient has no clinical signs or symptoms of lymphoma progression at this time. -No indication for further treatment of the patient's lymphoma at this time which continues to remain in remission. -She will continue to follow-up with her infectious disease doctor at [facility] for continued management of her HIV and hepatitis B. -continue to stay active with regular exercise and recommend strength training -Recommend applying OTC silicone dressing to improve keloid -Patient is UTD with her vaccinations including pneumonia, COVID-19 booster, and influenza  FOLLOW-UP: ***  The total time spent in the appointment was *** minutes* .  All of the patient's questions were answered with apparent satisfaction. The patient knows to call the clinic with any problems, questions or concerns.   Wyvonnia Lora MD MS AAHIVMS [facility] Hematology/Oncology Physician [facility]  .*Total Encounter Time as defined by the Centers for Medicare and Medicaid Services includes, in addition to the face-to-face time of a patient visit (documented in the note above) non-face-to-face time: obtaining and reviewing outside history, ordering and reviewing medications, tests or procedures, care coordination (communications with other health care professionals or caregivers) and documentation in the medical record.   I,Param Shah,acting as a Neurosurgeon for Wyvonnia Lora, MD.,have documented all relevant documentation on the behalf of Wyvonnia Lora, MD,as directed by  Wyvonnia Lora, MD while in the presence of Wyvonnia Lora, MD.

## 2023-10-22 ENCOUNTER — Telehealth: Payer: Self-pay | Admitting: Hematology

## 2023-10-22 NOTE — Telephone Encounter (Signed)
Spoke with patient confirming upcoming appointment  

## 2023-12-04 ENCOUNTER — Inpatient Hospital Stay: Payer: BC Managed Care – PPO | Attending: Hematology

## 2023-12-04 ENCOUNTER — Inpatient Hospital Stay: Payer: BC Managed Care – PPO | Admitting: Hematology

## 2023-12-04 VITALS — BP 148/89 | HR 84 | Temp 97.5°F | Resp 16 | Wt 164.2 lb

## 2023-12-04 DIAGNOSIS — C8338 Diffuse large B-cell lymphoma, lymph nodes of multiple sites: Secondary | ICD-10-CM | POA: Diagnosis not present

## 2023-12-04 DIAGNOSIS — C83398 Diffuse large b-cell lymphoma of other extranodal and solid organ sites: Secondary | ICD-10-CM | POA: Diagnosis present

## 2023-12-04 LAB — CMP (CANCER CENTER ONLY)
ALT: 13 U/L (ref 0–44)
AST: 10 U/L — ABNORMAL LOW (ref 15–41)
Albumin: 4.3 g/dL (ref 3.5–5.0)
Alkaline Phosphatase: 85 U/L (ref 38–126)
Anion gap: 5 (ref 5–15)
BUN: 13 mg/dL (ref 6–20)
CO2: 32 mmol/L (ref 22–32)
Calcium: 9.9 mg/dL (ref 8.9–10.3)
Chloride: 106 mmol/L (ref 98–111)
Creatinine: 0.87 mg/dL (ref 0.44–1.00)
GFR, Estimated: >60 mL/min (ref >60)
Glucose, Bld: 105 mg/dL — ABNORMAL HIGH (ref 70–99)
Potassium: 3.9 mmol/L (ref 3.5–5.1)
Sodium: 143 mmol/L (ref 135–145)
Total Bilirubin: 0.4 mg/dL (ref 0.0–1.2)
Total Protein: 7.6 g/dL (ref 6.5–8.1)

## 2023-12-04 LAB — CBC WITH DIFFERENTIAL (CANCER CENTER ONLY)
Abs Immature Granulocytes: 0 10*3/uL (ref 0.00–0.07)
Basophils Absolute: 0 10*3/uL (ref 0.0–0.1)
Basophils Relative: 1 %
Eosinophils Absolute: 0.1 10*3/uL (ref 0.0–0.5)
Eosinophils Relative: 3 %
HCT: 41.2 % (ref 36.0–46.0)
Hemoglobin: 13.3 g/dL (ref 12.0–15.0)
Immature Granulocytes: 0 %
Lymphocytes Relative: 43 %
Lymphs Abs: 1.7 10*3/uL (ref 0.7–4.0)
MCH: 26.4 pg (ref 26.0–34.0)
MCHC: 32.3 g/dL (ref 30.0–36.0)
MCV: 81.7 fL (ref 80.0–100.0)
Monocytes Absolute: 0.3 10*3/uL (ref 0.1–1.0)
Monocytes Relative: 7 %
Neutro Abs: 1.9 10*3/uL (ref 1.7–7.7)
Neutrophils Relative %: 46 %
Platelet Count: 267 10*3/uL (ref 150–400)
RBC: 5.04 MIL/uL (ref 3.87–5.11)
RDW: 15.9 % — ABNORMAL HIGH (ref 11.5–15.5)
WBC Count: 4 10*3/uL (ref 4.0–10.5)
nRBC: 0 % (ref 0.0–0.2)

## 2023-12-04 LAB — LACTATE DEHYDROGENASE: LDH: 191 U/L (ref 98–192)

## 2023-12-04 NOTE — Progress Notes (Signed)
HEMATOLOGY/ONCOLOGY CLINIC NOTE  Date of Service: 12/04/2023  Patient Care Team: Cira Servant, MD as PCP - General (Internal Medicine)  CHIEF COMPLAINTS/PURPOSE OF CONSULTATION:  Follow-up for continued evaluation and management of large B-cell lymphoma  HISTORY OF PRESENTING ILLNESS:  Please see previous notes for details on initial presentation.  INTERVAL HISTORY:  Heather [name] is a 48 y.o. female here for continued evaluation and management of large B-cell lymphoma. Patient was last seen by me on 03/01/2023 and reported sharp stabbing pain in her central chest and reported that her keloid was increasing in size.   Today, she is accompanied by her husband. She reports that she recently traveled to [place] in July 2024. Patient has been doing well overall since her last clinical visit.   She denies an other new symptoms or other medical changes. She denies starting any new medications recently.   Patient denies any fever, chills, skin rashes, lumps/ bumps, or leg swelling. She reports that her hot flashes have significantly decreased. Patient does not have any menstrual bleeding at this time.   She reports that she has been fasting prior to lab work and her blood glucose level is 105 mg/dL in clinic today.   She reports that she is UTD with her age-appropriate vaccinations, including COVID-19 booster, and flu shot.    She continues to follow with Dr. Reginold Agent and she reports that things are stable from an infectious disease standpoint.   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 She reports that she plans to pursue further education with the goal to obtain teaching/administrative roles.   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)    History of blood transfusion    childhood   HIV (human immunodeficiency virus  infection) (HCC)    Immune deficiency disorder (HCC)     SURGICAL HISTORY: Past Surgical History:  Procedure Laterality Date   CHROMOPERTUBATION Bilateral 11/29/2016   Procedure: CHROMOPERTUBATION;  Surgeon: Hoover Browns, MD;  Location: [facility];  Service: Gynecology;  Laterality: Bilateral;  fallopian tubes   ENDOMETRIAL BIOPSY     IR IMAGING GUIDED PORT INSERTION  04/15/2020   IR REMOVAL TUN ACCESS W/ PORT W/O FL MOD SED  10/02/2021   IR THORACENTESIS ASP PLEURAL SPACE W/IMG GUIDE  04/15/2020   MYOMECTOMY N/A 11/29/2016   Procedure: MYOMECTOMY;  Surgeon: Hoover Browns, MD;  Location: [facility];  Service: Gynecology;  Laterality: N/A;    SOCIAL HISTORY: Social History   Socioeconomic History   Marital status: Married    Spouse name: Not on file   Number of children: Not on file   Years of education: Not on file   Highest education level: Not on file  Occupational History   Not on file  Tobacco Use   Smoking status: Never   Smokeless tobacco: Never  Substance and Sexual Activity   Alcohol use: Yes    Comment: occ   Drug use: No   Sexual activity: Yes  Other Topics Concern   Not on file  Social History Narrative   Not on file   Social Drivers of Health   Financial Resource Strain: Not on file  Food Insecurity: Low Risk  (09/02/2023)   Received from [facility]   Hunger Vital Sign    Worried About Running Out of Food in  the Last Year: Never true    Ran Out of Food in the Last Year: Never true  Transportation Needs: No Transportation Needs (09/02/2023)   Received from [facility]    In the past 12 months, has lack of reliable transportation kept you from medical appointments, meetings, work or from getting things needed for daily living? : No  Physical Activity: Not on file  Stress: Not on file  Social Connections: Not on file  Intimate Partner Violence: Not on file    FAMILY HISTORY: Family History  Problem Relation Age of Onset   Diabetes Mother     Hypertension Mother     ALLERGIES:  is allergic to benadryl allergy [diphenhydramine hcl].  MEDICATIONS:  Current Outpatient Medications  Medication Sig Dispense Refill   acetaminophen (TYLENOL) 500 MG tablet Take 500 mg by mouth every 6 (six) hours as needed for mild pain or headache.     acyclovir (ZOVIRAX) 400 MG tablet TAKE 1 TABLET BY MOUTH TWICE A DAY 180 tablet 2   B Complex Vitamins (B COMPLEX PO) Take 1 tablet by mouth daily.     bictegravir-emtricitabine-tenofovir AF (BIKTARVY) 50-200-25 MG TABS tablet Take 1 tablet by mouth daily with breakfast.      cholecalciferol (VITAMIN D3) 25 MCG (1000 UNIT) tablet Take 1 tablet (1,000 Units total) by mouth at bedtime.     feeding supplement, ENSURE ENLIVE, (ENSURE ENLIVE) LIQD Take 237 mLs by mouth 2 (two) times daily between meals. 237 mL 12   ibuprofen (ADVIL) 200 MG tablet Take 2 tablets (400 mg total) by mouth every 6 (six) hours as needed for mild pain. 30 tablet 0   lidocaine-prilocaine (EMLA) cream Apply to affected area once 30 g 3   Multiple Vitamin (MULTIVITAMIN WITH MINERALS) TABS tablet Take 1 tablet by mouth at bedtime.      ondansetron (ZOFRAN) 8 MG tablet Take 1 tablet (8 mg total) by mouth every 8 (eight) hours as needed for nausea or vomiting. 30 tablet 1   polyethylene glycol (MIRALAX / GLYCOLAX) 17 g packet Take 17 g by mouth daily as needed for mild constipation. 14 each 0   senna-docusate (SENOKOT-S) 8.6-50 MG tablet Take 2 tablets by mouth at bedtime as needed for mild constipation.     No current facility-administered medications for this visit.    REVIEW OF SYSTEMS:    10 Point review of Systems was done is negative except as noted above.   PHYSICAL EXAMINATION: ECOG PERFORMANCE STATUS: 1 - Symptomatic but completely ambulatory  Vitals:   12/04/23 1033  BP: (!) 148/89  Pulse: 84  Resp: 16  Temp: (!) 97.5 F (36.4 C)  SpO2: 98%   Filed Weights   12/04/23 1033  Weight: 164 lb 3.2 oz (74.5 kg)   .Body  mass index is 32.07 kg/m.   GENERAL:alert, in no acute distress and comfortable SKIN: no acute rashes, no significant lesions EYES: conjunctiva are pink and non-injected, sclera anicteric OROPHARYNX: MMM, no exudates, no oropharyngeal erythema or ulceration NECK: supple, no JVD LYMPH:  no palpable lymphadenopathy in the cervical, axillary or inguinal regions LUNGS: clear to auscultation b/l with normal respiratory effort HEART: regular rate & rhythm ABDOMEN:  normoactive bowel sounds , non tender, not distended. Extremity: no pedal edema PSYCH: alert & oriented x 3 with fluent speech NEURO: no focal motor/sensory deficits   LABORATORY DATA:  I have reviewed the data as listed  .    Latest Ref Rng & Units 12/04/2023  10:25 AM 03/01/2023    1:44 PM 09/13/2022    2:57 PM  CBC  WBC 4.0 - 10.5 K/uL 4.0  4.6  7.8   Hemoglobin 12.0 - 15.0 g/dL 44.0  34.7  42.5   Hematocrit 36.0 - 46.0 % 41.2  39.6  41.1   Platelets 150 - 400 K/uL 267  226  278     .    Latest Ref Rng & Units 12/04/2023   10:25 AM 03/01/2023    1:44 PM 09/13/2022    2:57 PM  CMP  Glucose 70 - 99 mg/dL 956  88  387   BUN 6 - 20 mg/dL 13  15  9    Creatinine 0.44 - 1.00 mg/dL 5.64  3.32  9.51   Sodium 135 - 145 mmol/L 143  141  134   Potassium 3.5 - 5.1 mmol/L 3.9  3.9  3.7   Chloride 98 - 111 mmol/L 106  104  100   CO2 22 - 32 mmol/L 32  32  25   Calcium 8.9 - 10.3 mg/dL 9.9  88.4  9.0   Total Protein 6.5 - 8.1 g/dL 7.6  7.3  7.7   Total Bilirubin 0.0 - 1.2 mg/dL 0.4  0.4  0.6   Alkaline Phos 38 - 126 U/L 85  79  476   AST 15 - 41 U/L 10  19  27    ALT 0 - 44 U/L 13  18  27     . Lab Results  Component Value Date   LDH 191 12/04/2023     04/08/2020 Pancreatic Mass Surgical Pathology Report [id number]):    RADIOGRAPHIC STUDIES: I have personally reviewed the radiological images as listed and agreed with the findings in the report. No results found.  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 No internal necrosis within the mass noted. Diffuse splenomegaly. Left periaortic lymph node causing mild displacement of the third portion of the duodenum.   HIV could be risk factors for high grade EBV driven lymphomas   #2 history of HIV/AIDS last VL undetectable follows with Dr. Madlyn Frankel and at [facility].   #3 h/o Hepatitis B - last VL undetectable  PLAN:  -Discussed lab results on 12/04/23 in detail with patient. CBC normal, showed WBC of 4.0K, hemoglobin of 13.3, and platelets of 267K. -educated patient that in the context of fasting for 12 hour periods, our hormones push to make more glucose changes involving neoglycogenesis. Her blood glucose level of 105 mg/dL is not significantly concerning at this time. There may be a role to connect with her PCP to consider hemoglobin A1C testing if her glucose levels are borderline.  -did not feel an enlarged lymph node during physical examination -Patient has no clinical signs or symptoms of lymphoma progression at this time. -No indication for further treatment of the patient's lymphoma at this time which continues to remain in remission. -advised patient to stay well-hydrated, stay physically active, and limit caffeine consumption to further improve hot flashes -answered all of patient's questions in detail today -patient shall return to clinic in 1 year  -educated patient that the risk of recurrence of large B-cell lymphoma after 2 years is less than  5% -She will continue to follow-up with her infectious disease doctor at [facility] for continued management of her HIV and hepatitis B.   #4 tendency to have keloids  FOLLOW  -UP: RTC with Dr Candise Che with labs in 12 months  The total time spent in the appointment was 25 minutes* .  All of the patient's questions were answered with apparent satisfaction. The patient knows to call the clinic with any problems, questions or concerns.   Wyvonnia Lora MD MS AAHIVMS [facility] Hematology/Oncology Physician [facility]  .*Total Encounter Time as defined by the Centers for Medicare and Medicaid Services includes, in addition to the face-to-face time of a patient visit (documented in the note above) non-face-to-face time: obtaining and reviewing outside history, ordering and reviewing medications, tests or procedures, care coordination (communications with other health care professionals or caregivers) and documentation in the medical record.    I,Mitra Faeizi,acting as a Neurosurgeon for Wyvonnia Lora, MD.,have documented all relevant documentation on the behalf of Wyvonnia Lora, MD,as directed by  Wyvonnia Lora, MD while in the presence of Wyvonnia Lora, MD.  .I have reviewed the above documentation for accuracy and completeness, and I agree with the above. Johney Maine MD

## 2023-12-05 ENCOUNTER — Telehealth: Payer: Self-pay | Admitting: Hematology

## 2023-12-05 NOTE — Telephone Encounter (Signed)
Spoke with patient confirming upcoming appointment

## 2024-12-03 ENCOUNTER — Other Ambulatory Visit: Payer: Self-pay

## 2024-12-03 DIAGNOSIS — C8338 Diffuse large B-cell lymphoma, lymph nodes of multiple sites: Secondary | ICD-10-CM

## 2024-12-04 ENCOUNTER — Inpatient Hospital Stay: Payer: BC Managed Care – PPO | Attending: Hematology

## 2024-12-04 ENCOUNTER — Inpatient Hospital Stay: Payer: BC Managed Care – PPO | Admitting: Hematology

## 2024-12-04 VITALS — BP 149/93 | HR 85 | Temp 97.7°F | Resp 17 | Ht 60.0 in | Wt 169.0 lb

## 2024-12-04 DIAGNOSIS — C8338 Diffuse large B-cell lymphoma, lymph nodes of multiple sites: Secondary | ICD-10-CM | POA: Diagnosis not present

## 2024-12-04 LAB — CBC WITH DIFFERENTIAL (CANCER CENTER ONLY)
Abs Immature Granulocytes: 0 10*3/uL (ref 0.00–0.07)
Basophils Absolute: 0 10*3/uL (ref 0.0–0.1)
Basophils Relative: 1 %
Eosinophils Absolute: 0.2 10*3/uL (ref 0.0–0.5)
Eosinophils Relative: 4 %
HCT: 41.6 % (ref 36.0–46.0)
Hemoglobin: 13.8 g/dL (ref 12.0–15.0)
Immature Granulocytes: 0 %
Lymphocytes Relative: 48 %
Lymphs Abs: 2 10*3/uL (ref 0.7–4.0)
MCH: 26.8 pg (ref 26.0–34.0)
MCHC: 33.2 g/dL (ref 30.0–36.0)
MCV: 80.9 fL (ref 80.0–100.0)
Monocytes Absolute: 0.2 10*3/uL (ref 0.1–1.0)
Monocytes Relative: 5 %
Neutro Abs: 1.7 10*3/uL (ref 1.7–7.7)
Neutrophils Relative %: 42 %
Platelet Count: 267 10*3/uL (ref 150–400)
RBC: 5.14 MIL/uL — ABNORMAL HIGH (ref 3.87–5.11)
RDW: 15 % (ref 11.5–15.5)
WBC Count: 4.1 10*3/uL (ref 4.0–10.5)
nRBC: 0 % (ref 0.0–0.2)

## 2024-12-04 LAB — CMP (CANCER CENTER ONLY)
ALT: 18 U/L (ref 0–44)
AST: 22 U/L (ref 15–41)
Albumin: 4.6 g/dL (ref 3.5–5.0)
Alkaline Phosphatase: 89 U/L (ref 38–126)
Anion gap: 12 (ref 5–15)
BUN: 11 mg/dL (ref 6–20)
CO2: 26 mmol/L (ref 22–32)
Calcium: 10 mg/dL (ref 8.9–10.3)
Chloride: 104 mmol/L (ref 98–111)
Creatinine: 0.92 mg/dL (ref 0.44–1.00)
GFR, Estimated: >60 mL/min
Glucose, Bld: 113 mg/dL — ABNORMAL HIGH (ref 70–99)
Potassium: 3.8 mmol/L (ref 3.5–5.1)
Sodium: 143 mmol/L (ref 135–145)
Total Bilirubin: 0.5 mg/dL (ref 0.0–1.2)
Total Protein: 8 g/dL (ref 6.5–8.1)

## 2024-12-04 LAB — LACTATE DEHYDROGENASE: LDH: 222 U/L (ref 105–235)

## 2024-12-04 NOTE — Progress Notes (Signed)
 " HEMATOLOGY ONCOLOGY PROGRESS NOTE  Date of service: 12/04/2024  Patient Care Team: Tex Comer SAUNDERS, MD as PCP - General (Internal Medicine)  CHIEF COMPLAINT/PURPOSE OF CONSULTATION: Follow-up for continued evaluation and management of large B-cell lymphoma.  HISTORY OF PRESENTING ILLNESS: Please see previous notes for details on initial presentation.   SUMMARY OF ONCOLOGIC HISTORY: 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HCC)  04/12/2020 Initial Diagnosis   Diffuse large B-cell lymphoma of lymph nodes of multiple regions (HCC)   04/18/2020 - 08/05/2020 Chemotherapy   Patient is on Treatment Plan : IP NON-HODGKINS LYMPHOMA EPOCH q21d     04/25/2020 - 08/08/2020 Chemotherapy   Patient is on Treatment Plan : NON-HODGKINS LYMPHOMA Rituximab  q21d       INTERVAL HISTORY: Heather [name] is a 49 y.o. female who is here today for continued evaluation and management of large B-cell lymphoma.   she was last seen by me on 12/04/2023; at the time she did not have any concerns and was doing well.   Today, she reports she is feeling well.   She experiences some tingling in her fingers, especially when it is cold outside. She also reports some visionary changes, and has been wearing her glasses more frequently.   She has a keloid on her upper left thoracic region that has been bothering her. She tried steroids, but this was unsuccessful to treat it. She is considering surgical removal. This is being evaluated by her PCP.   She denies any fevers/chills, drenching night sweats, unexpected weight change, new lumps/bumps, bleeding issues (nose bleeds, gum bleeds, abnormal/spontaneous bruising), and changes in breathing.   -Labs look good today  -Mentioned that she will be 5 years in remission in August 2026, we will move to follow-ups as needed  -Will look for a dermantologist, radiation oncologist, or plastic surgeon for keloid removal  referral -Discussed best option would be surgical keloid removal, then topical steroid treatments after to prevent scar tissue from multiplying   REVIEW OF SYSTEMS:   10 Point review of systems of done and is negative except as noted above.  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)    History of blood transfusion    childhood   HIV (human immunodeficiency virus infection) (HCC)    Immune deficiency disorder     SURGICAL HISTORY Past Surgical History:  Procedure Laterality Date   CHROMOPERTUBATION Bilateral 11/29/2016   Procedure: CHROMOPERTUBATION;  Surgeon: Jerolyn Foil, MD;  Location: [facility];  Service: Gynecology;  Laterality: Bilateral;  fallopian tubes   ENDOMETRIAL BIOPSY     IR IMAGING GUIDED PORT INSERTION  04/15/2020   IR REMOVAL TUN ACCESS W/ PORT W/O FL MOD SED  10/02/2021   IR THORACENTESIS RIGHT ASP PLEURAL SPACE W/IMG GUIDE  04/15/2020   MYOMECTOMY N/A 11/29/2016   Procedure: MYOMECTOMY;  Surgeon: Jerolyn Foil, MD;  Location: [facility];  Service: Gynecology;  Laterality: N/A;    SOCIAL HISTORY Social History[1]  Social History   Social History Narrative   Not on file    SOCIAL DRIVERS OF HEALTH SDOH Screenings   Food Insecurity: Low Risk (09/02/2023)   Received from [facility]  Housing: Low Risk (09/02/2023)   Received from [facility]  Transportation Needs: No Transportation Needs (09/02/2023)   Received from [facility]  Utilities:  Low Risk (09/02/2023)   Received from [facility]  Depression (PHQ2-9): Low Risk (12/04/2024)  Tobacco Use: Low Risk (06/22/2024)   Received from [facility]     FAMILY HISTORY Family History  Problem Relation Age of Onset   Diabetes Mother    Hypertension Mother      ALLERGIES: is allergic to benadryl  allergy [diphenhydramine  hcl].  MEDICATIONS  Current Outpatient  Medications  Medication Sig Dispense Refill   acetaminophen  (TYLENOL ) 500 MG tablet Take 500 mg by mouth every 6 (six) hours as needed for mild pain or headache.     acyclovir  (ZOVIRAX ) 400 MG tablet TAKE 1 TABLET BY MOUTH TWICE A DAY 180 tablet 2   B Complex Vitamins (B COMPLEX PO) Take 1 tablet by mouth daily.     bictegravir-emtricitabine -tenofovir  AF (BIKTARVY ) 50-200-25 MG TABS tablet Take 1 tablet by mouth daily with breakfast.      cholecalciferol  (VITAMIN D3) 25 MCG (1000 UNIT) tablet Take 1 tablet (1,000 Units total) by mouth at bedtime.     feeding supplement, ENSURE ENLIVE, (ENSURE ENLIVE) LIQD Take 237 mLs by mouth 2 (two) times daily between meals. 237 mL 12   ibuprofen  (ADVIL ) 200 MG tablet Take 2 tablets (400 mg total) by mouth every 6 (six) hours as needed for mild pain. 30 tablet 0   lidocaine -prilocaine  (EMLA ) cream Apply to affected area once 30 g 3   Multiple Vitamin (MULTIVITAMIN WITH MINERALS) TABS tablet Take 1 tablet by mouth at bedtime.      ondansetron  (ZOFRAN ) 8 MG tablet Take 1 tablet (8 mg total) by mouth every 8 (eight) hours as needed for nausea or vomiting. 30 tablet 1   polyethylene glycol (MIRALAX  / GLYCOLAX ) 17 g packet Take 17 g by mouth daily as needed for mild constipation. 14 each 0   senna-docusate (SENOKOT-S) 8.6-50 MG tablet Take 2 tablets by mouth at bedtime as needed for mild constipation.     No current facility-administered medications for this visit.    PHYSICAL EXAMINATION: ECOG PERFORMANCE STATUS: 1 - Symptomatic but completely ambulatory VITALS: Vitals:   12/04/24 1045  BP: (!) 149/93  Pulse: 85  Resp: 17  Temp: 97.7 F (36.5 C)  SpO2: 98%   Filed Weights   12/04/24 1045  Weight: 169 lb (76.7 kg)   Body mass index is 33.01 kg/m.  GENERAL: alert, in no acute distress and comfortable SKIN: no acute rashes, no significant lesions EYES: conjunctiva are pink and non-injected, sclera anicteric OROPHARYNX: MMM, no exudates, no  oropharyngeal erythema or ulceration NECK: supple, no JVD LYMPH:  no palpable lymphadenopathy in the cervical, axillary or inguinal regions LUNGS: clear to auscultation b/l with normal respiratory effort HEART: regular rate & rhythm ABDOMEN:  normoactive bowel sounds , non tender, not distended, no hepatosplenomegaly Extremity: no pedal edema PSYCH: alert & oriented x 3 with fluent speech NEURO: no focal motor/sensory deficits  LABORATORY DATA:   I have reviewed the data as listed     Latest Ref Rng & Units 12/04/2024   10:28 AM 12/04/2023   10:25 AM 03/01/2023    1:44 PM  CBC EXTENDED  WBC 4.0 - 10.5 K/uL 4.1  4.0  4.6   RBC 3.87 - 5.11 MIL/uL 5.14  5.04  4.78   Hemoglobin 12.0 - 15.0 g/dL 86.1  86.6  86.7   HCT 36.0 - 46.0 % 41.6  41.2  39.6   Platelets 150 - 400 K/uL 267  267  226   NEUT# 1.7 - 7.7  K/uL 1.7  1.9  2.0   Lymph# 0.7 - 4.0 K/uL 2.0  1.7  2.0        Latest Ref Rng & Units 12/04/2024   10:28 AM 12/04/2023   10:25 AM 03/01/2023    1:44 PM  CMP  Glucose 70 - 99 mg/dL 886  894  88   BUN 6 - 20 mg/dL 11  13  15    Creatinine 0.44 - 1.00 mg/dL 9.07  9.12  9.12   Sodium 135 - 145 mmol/L 143  143  141   Potassium 3.5 - 5.1 mmol/L 3.8  3.9  3.9   Chloride 98 - 111 mmol/L 104  106  104   CO2 22 - 32 mmol/L 26  32  32   Calcium 8.9 - 10.3 mg/dL 89.9  9.9  89.6   Total Protein 6.5 - 8.1 g/dL 8.0  7.6  7.3   Total Bilirubin 0.0 - 1.2 mg/dL 0.5  0.4  0.4   Alkaline Phos 38 - 126 U/L 89  85  79   AST 15 - 41 U/L 22  10  19    ALT 0 - 44 U/L 18  13  18     04/08/2020 Pancreatic Mass Surgical Pathology Report [phone] [phone]):       RADIOGRAPHIC STUDIES: I have personally reviewed the radiological images as listed and agreed with the findings in the report. No results found.  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 No internal necrosis within the mass noted. Diffuse splenomegaly. Left periaortic lymph node causing mild displacement of the third portion of the duodenum.   HIV could be risk factors for high grade EBV driven lymphomas   #2 history of HIV/AIDS last VL undetectable follows with Dr. Rollo Paras and at [facility].   #3 h/o Hepatitis B - last VL undetectable #4 tendency to have keloids    PLAN:  -Labs look good today  -Mentioned that she will be 5 years in remission in August 2026, we will move to follow-ups as needed  -Will look for a dermantologist, radiation oncologist, or plastic surgeon for keloid removal referral -Discussed best option would be surgical keloid removal, then topical steroid treatments after to prevent scar tissue from multiplying  FOLLOW-UP in 12 months for labs and follow-up with Dr. Onesimo.  The total time spent in the appointment was *** minutes* .  All of the patient's questions were answered and the patient knows to call the clinic with any problems, questions, or concerns.  Emaline Onesimo MD MS AAHIVMS [facility] Hematology/Oncology Physician [facility]  *Total Encounter Time as defined by the Centers for Medicare and Medicaid Services includes, in addition to the face-to-face time of a patient visit (documented in the note above) non-face-to-face time: obtaining and reviewing outside history, ordering and reviewing medications, tests or procedures, care coordination (communications with other health care professionals or caregivers) and documentation in the medical record.  I, Alan Blowers, acting as a neurosurgeon for Emaline Onesimo, MD.,have documented all relevant documentation on the behalf of Emaline Onesimo, MD,as directed by  Emaline Onesimo, MD while in the presence of Emaline Onesimo, MD.  I have reviewed  the above documentation for  accuracy and completeness, and I agree with the above.  Emaline Saran, MD     [1]  Social History Tobacco Use   Smoking status: Never   Smokeless tobacco: Never  Substance Use Topics   Alcohol use: Yes    Comment: occ   Drug use: No   "

## 2025-12-06 ENCOUNTER — Inpatient Hospital Stay: Admitting: Hematology

## 2025-12-06 ENCOUNTER — Inpatient Hospital Stay
